# Patient Record
Sex: Male | Born: 1950 | Race: White | Hispanic: No | Marital: Married | Smoking: Former smoker
Health system: Southern US, Community
[De-identification: ages and names within clinical notes are randomized; demographics above are authoritative.]

## PROBLEM LIST (undated history)

## (undated) DIAGNOSIS — I10 Essential (primary) hypertension: Secondary | ICD-10-CM

## (undated) DIAGNOSIS — F419 Anxiety disorder, unspecified: Secondary | ICD-10-CM

## (undated) DIAGNOSIS — G4700 Insomnia, unspecified: Secondary | ICD-10-CM

## (undated) DIAGNOSIS — M1990 Unspecified osteoarthritis, unspecified site: Secondary | ICD-10-CM

## (undated) DIAGNOSIS — N400 Enlarged prostate without lower urinary tract symptoms: Secondary | ICD-10-CM

## (undated) DIAGNOSIS — E559 Vitamin D deficiency, unspecified: Secondary | ICD-10-CM

## (undated) DIAGNOSIS — E538 Deficiency of other specified B group vitamins: Secondary | ICD-10-CM

## (undated) DIAGNOSIS — K5790 Diverticulosis of intestine, part unspecified, without perforation or abscess without bleeding: Secondary | ICD-10-CM

## (undated) DIAGNOSIS — M2550 Pain in unspecified joint: Secondary | ICD-10-CM

## (undated) DIAGNOSIS — M549 Dorsalgia, unspecified: Secondary | ICD-10-CM

## (undated) DIAGNOSIS — F32A Depression, unspecified: Secondary | ICD-10-CM

## (undated) DIAGNOSIS — J189 Pneumonia, unspecified organism: Secondary | ICD-10-CM

## (undated) DIAGNOSIS — M1711 Unilateral primary osteoarthritis, right knee: Secondary | ICD-10-CM

## (undated) DIAGNOSIS — T4145XA Adverse effect of unspecified anesthetic, initial encounter: Secondary | ICD-10-CM

## (undated) DIAGNOSIS — M62838 Other muscle spasm: Secondary | ICD-10-CM

## (undated) DIAGNOSIS — F329 Major depressive disorder, single episode, unspecified: Secondary | ICD-10-CM

## (undated) DIAGNOSIS — R5382 Chronic fatigue, unspecified: Secondary | ICD-10-CM

## (undated) DIAGNOSIS — K649 Unspecified hemorrhoids: Secondary | ICD-10-CM

## (undated) DIAGNOSIS — R350 Frequency of micturition: Secondary | ICD-10-CM

## (undated) DIAGNOSIS — R42 Dizziness and giddiness: Secondary | ICD-10-CM

## (undated) DIAGNOSIS — E785 Hyperlipidemia, unspecified: Secondary | ICD-10-CM

## (undated) DIAGNOSIS — M2540 Effusion, unspecified joint: Secondary | ICD-10-CM

## (undated) DIAGNOSIS — M791 Myalgia, unspecified site: Secondary | ICD-10-CM

## (undated) DIAGNOSIS — T8859XA Other complications of anesthesia, initial encounter: Secondary | ICD-10-CM

## (undated) HISTORY — DX: Essential (primary) hypertension: I10

## (undated) HISTORY — DX: Vitamin D deficiency, unspecified: E55.9

## (undated) HISTORY — DX: Hyperlipidemia, unspecified: E78.5

## (undated) HISTORY — DX: Other complications of anesthesia, initial encounter: T88.59XA

## (undated) HISTORY — DX: Dizziness and giddiness: R42

## (undated) HISTORY — DX: Adverse effect of unspecified anesthetic, initial encounter: T41.45XA

## (undated) HISTORY — DX: Insomnia, unspecified: G47.00

## (undated) HISTORY — DX: Chronic fatigue, unspecified: R53.82

## (undated) HISTORY — DX: Unilateral primary osteoarthritis, right knee: M17.11

## (undated) HISTORY — DX: Anxiety disorder, unspecified: F41.9

## (undated) HISTORY — DX: Myalgia, unspecified site: M79.10

## (undated) HISTORY — DX: Benign prostatic hyperplasia without lower urinary tract symptoms: N40.0

## (undated) HISTORY — PX: JOINT REPLACEMENT: SHX530

## (undated) HISTORY — DX: Deficiency of other specified B group vitamins: E53.8

## (undated) HISTORY — PX: COLONOSCOPY: SHX174

---
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
## 2005-03-27 LAB — HM COLONOSCOPY

## 2007-03-28 DIAGNOSIS — E538 Deficiency of other specified B group vitamins: Secondary | ICD-10-CM

## 2007-03-28 HISTORY — DX: Deficiency of other specified B group vitamins: E53.8

## 2007-08-29 ENCOUNTER — Ambulatory Visit: Payer: Self-pay | Admitting: Internal Medicine

## 2007-08-29 DIAGNOSIS — K469 Unspecified abdominal hernia without obstruction or gangrene: Secondary | ICD-10-CM | POA: Insufficient documentation

## 2007-08-29 DIAGNOSIS — R5381 Other malaise: Secondary | ICD-10-CM

## 2007-08-29 DIAGNOSIS — J309 Allergic rhinitis, unspecified: Secondary | ICD-10-CM | POA: Insufficient documentation

## 2007-08-29 DIAGNOSIS — L719 Rosacea, unspecified: Secondary | ICD-10-CM | POA: Insufficient documentation

## 2007-08-29 DIAGNOSIS — R972 Elevated prostate specific antigen [PSA]: Secondary | ICD-10-CM

## 2007-08-29 DIAGNOSIS — I10 Essential (primary) hypertension: Secondary | ICD-10-CM | POA: Insufficient documentation

## 2007-08-29 DIAGNOSIS — R5383 Other fatigue: Secondary | ICD-10-CM

## 2007-08-29 DIAGNOSIS — E785 Hyperlipidemia, unspecified: Secondary | ICD-10-CM

## 2007-08-29 DIAGNOSIS — F411 Generalized anxiety disorder: Secondary | ICD-10-CM | POA: Insufficient documentation

## 2007-08-29 DIAGNOSIS — G479 Sleep disorder, unspecified: Secondary | ICD-10-CM | POA: Insufficient documentation

## 2007-09-10 ENCOUNTER — Encounter: Payer: Self-pay | Admitting: Internal Medicine

## 2007-10-18 ENCOUNTER — Telehealth: Payer: Self-pay | Admitting: Internal Medicine

## 2007-11-20 ENCOUNTER — Ambulatory Visit: Payer: Self-pay | Admitting: Internal Medicine

## 2007-11-20 LAB — CONVERTED CEMR LAB
ALT: 54 units/L — ABNORMAL HIGH (ref 0–53)
AST: 29 units/L (ref 0–37)
Albumin: 4 g/dL (ref 3.5–5.2)
Alkaline Phosphatase: 62 units/L (ref 39–117)
Basophils Absolute: 0 10*3/uL (ref 0.0–0.1)
Basophils Relative: 0.9 % (ref 0.0–3.0)
Bilirubin Urine: NEGATIVE
Calcium: 9.1 mg/dL (ref 8.4–10.5)
Chloride: 108 meq/L (ref 96–112)
Creatinine, Ser: 0.9 mg/dL (ref 0.4–1.5)
Eosinophils Absolute: 0.2 10*3/uL (ref 0.0–0.7)
GFR calc Af Amer: 112 mL/min
HDL: 35.5 mg/dL — ABNORMAL LOW (ref >39.0)
Hemoglobin: 15.9 g/dL (ref 13.0–17.0)
Ketones, ur: NEGATIVE mg/dL
LDL Cholesterol: 119 mg/dL — ABNORMAL HIGH (ref 0–99)
MCHC: 35.3 g/dL (ref 30.0–36.0)
MCV: 94.6 fL (ref 78.0–100.0)
Monocytes Absolute: 0.5 10*3/uL (ref 0.1–1.0)
Monocytes Relative: 8.4 % (ref 3.0–12.0)
Mucus, UA: NEGATIVE
Neutro Abs: 3.2 10*3/uL (ref 1.4–7.7)
Neutrophils Relative %: 59.4 % (ref 43.0–77.0)
Platelets: 265 10*3/uL (ref 150–400)
Potassium: 4 meq/L (ref 3.5–5.1)
RDW: 12.6 % (ref 11.5–14.6)
Sed Rate: 15 mm/hr (ref 0–16)
Specific Gravity, Urine: 1.01 (ref 1.000–1.03)
Testosterone: 345.56 ng/dL — ABNORMAL LOW (ref 350.00–890)
Total Bilirubin: 1.3 mg/dL — ABNORMAL HIGH (ref 0.3–1.2)
Total CHOL/HDL Ratio: 5.4
Total Protein: 6.9 g/dL (ref 6.0–8.3)
Triglycerides: 194 mg/dL — ABNORMAL HIGH (ref 0–149)
VLDL: 39 mg/dL (ref 0–40)
WBC: 5.5 10*3/uL (ref 4.5–10.5)

## 2007-11-29 ENCOUNTER — Ambulatory Visit: Payer: Self-pay | Admitting: Internal Medicine

## 2007-11-29 DIAGNOSIS — E538 Deficiency of other specified B group vitamins: Secondary | ICD-10-CM | POA: Insufficient documentation

## 2007-11-29 DIAGNOSIS — E559 Vitamin D deficiency, unspecified: Secondary | ICD-10-CM | POA: Insufficient documentation

## 2007-11-29 DIAGNOSIS — M791 Myalgia, unspecified site: Secondary | ICD-10-CM

## 2008-01-29 ENCOUNTER — Encounter: Payer: Self-pay | Admitting: Internal Medicine

## 2008-02-28 ENCOUNTER — Ambulatory Visit: Payer: Self-pay | Admitting: Internal Medicine

## 2008-03-02 LAB — CONVERTED CEMR LAB: Vit D, 1,25-Dihydroxy: 20 — ABNORMAL LOW (ref 30–89)

## 2008-03-06 ENCOUNTER — Ambulatory Visit: Payer: Self-pay | Admitting: Internal Medicine

## 2008-06-23 ENCOUNTER — Ambulatory Visit: Payer: Self-pay | Admitting: Internal Medicine

## 2008-06-23 LAB — CONVERTED CEMR LAB
CO2: 30 meq/L (ref 19–32)
Chloride: 103 meq/L (ref 96–112)
Creatinine, Ser: 0.8 mg/dL (ref 0.4–1.5)
Potassium: 4.3 meq/L (ref 3.5–5.1)

## 2008-07-03 ENCOUNTER — Ambulatory Visit: Payer: Self-pay | Admitting: Internal Medicine

## 2008-09-22 ENCOUNTER — Ambulatory Visit: Payer: Self-pay | Admitting: Internal Medicine

## 2008-09-22 LAB — CONVERTED CEMR LAB
CO2: 31 meq/L (ref 19–32)
Calcium: 9.3 mg/dL (ref 8.4–10.5)
Chloride: 103 meq/L (ref 96–112)
Glucose, Bld: 110 mg/dL — ABNORMAL HIGH (ref 70–99)
Sodium: 141 meq/L (ref 135–145)

## 2008-09-29 ENCOUNTER — Ambulatory Visit: Payer: Self-pay | Admitting: Internal Medicine

## 2009-01-15 ENCOUNTER — Ambulatory Visit: Payer: Self-pay | Admitting: Internal Medicine

## 2009-01-15 LAB — CONVERTED CEMR LAB
AST: 23 units/L (ref 0–37)
Albumin: 4 g/dL (ref 3.5–5.2)
Alkaline Phosphatase: 54 units/L (ref 39–117)
Basophils Absolute: 0 10*3/uL (ref 0.0–0.1)
CO2: 29 meq/L (ref 19–32)
Chloride: 103 meq/L (ref 96–112)
Cholesterol: 156 mg/dL (ref 0–200)
Eosinophils Absolute: 0.1 10*3/uL (ref 0.0–0.7)
Glucose, Bld: 123 mg/dL — ABNORMAL HIGH (ref 70–99)
HDL: 47.9 mg/dL (ref >39.00)
Ketones, ur: NEGATIVE mg/dL
LDL Cholesterol: 88 mg/dL (ref 0–99)
Leukocytes, UA: NEGATIVE
Lymphocytes Relative: 29.9 % (ref 12.0–46.0)
MCHC: 34.3 g/dL (ref 30.0–36.0)
Monocytes Relative: 10 % (ref 3.0–12.0)
Platelets: 233 10*3/uL (ref 150.0–400.0)
Potassium: 3.8 meq/L (ref 3.5–5.1)
RDW: 12.4 % (ref 11.5–14.6)
Sodium: 140 meq/L (ref 135–145)
Specific Gravity, Urine: 1.02 (ref 1.000–1.030)
TSH: 1.32 microintl units/mL (ref 0.35–5.50)
Total Bilirubin: 1 mg/dL (ref 0.3–1.2)
Total CHOL/HDL Ratio: 3
Triglycerides: 100 mg/dL (ref 0.0–149.0)
Urine Glucose: NEGATIVE mg/dL
Urobilinogen, UA: 0.2 (ref 0.0–1.0)
VLDL: 20 mg/dL (ref 0.0–40.0)
Vit D, 25-Hydroxy: 32 ng/mL (ref 30–89)
pH: 6 (ref 5.0–8.0)

## 2009-01-22 ENCOUNTER — Ambulatory Visit: Payer: Self-pay | Admitting: Internal Medicine

## 2009-01-22 DIAGNOSIS — Z87891 Personal history of nicotine dependence: Secondary | ICD-10-CM

## 2009-01-22 DIAGNOSIS — R079 Chest pain, unspecified: Secondary | ICD-10-CM

## 2009-06-30 ENCOUNTER — Telehealth: Payer: Self-pay | Admitting: Internal Medicine

## 2009-07-15 ENCOUNTER — Ambulatory Visit: Payer: Self-pay | Admitting: Internal Medicine

## 2009-07-15 LAB — CONVERTED CEMR LAB
Calcium: 9.7 mg/dL (ref 8.4–10.5)
GFR calc non Af Amer: 92.03 mL/min (ref >60)
Glucose, Bld: 103 mg/dL — ABNORMAL HIGH (ref 70–99)
Potassium: 4.5 meq/L (ref 3.5–5.1)
Sodium: 139 meq/L (ref 135–145)
Vit D, 25-Hydroxy: 35 ng/mL (ref 30–89)

## 2009-07-22 ENCOUNTER — Ambulatory Visit: Payer: Self-pay | Admitting: Internal Medicine

## 2009-07-22 DIAGNOSIS — N400 Enlarged prostate without lower urinary tract symptoms: Secondary | ICD-10-CM | POA: Insufficient documentation

## 2009-10-22 ENCOUNTER — Ambulatory Visit: Payer: Self-pay | Admitting: Internal Medicine

## 2009-10-22 LAB — CONVERTED CEMR LAB
Calcium: 8.9 mg/dL (ref 8.4–10.5)
GFR calc non Af Amer: 106.87 mL/min (ref >60)
PSA: 3.48 ng/mL (ref 0.10–4.00)
Potassium: 4.1 meq/L (ref 3.5–5.1)
Sodium: 136 meq/L (ref 135–145)

## 2009-10-24 LAB — CONVERTED CEMR LAB
PSA, Free Pct: 6 — ABNORMAL LOW (ref >25)
PSA, Free: 0.2 ng/mL
PSA: 3.09 ng/mL (ref 0.10–4.00)

## 2009-10-29 ENCOUNTER — Ambulatory Visit: Payer: Self-pay | Admitting: Internal Medicine

## 2009-12-20 ENCOUNTER — Encounter: Payer: Self-pay | Admitting: Internal Medicine

## 2010-01-25 HISTORY — PX: PROSTATE BIOPSY: SHX241

## 2010-02-16 ENCOUNTER — Encounter: Payer: Self-pay | Admitting: Internal Medicine

## 2010-03-27 HISTORY — PX: KNEE ARTHROSCOPY: SUR90

## 2010-04-22 ENCOUNTER — Other Ambulatory Visit: Payer: Self-pay | Admitting: Internal Medicine

## 2010-04-22 ENCOUNTER — Ambulatory Visit
Admission: RE | Admit: 2010-04-22 | Discharge: 2010-04-22 | Payer: Self-pay | Source: Home / Self Care | Attending: Internal Medicine | Admitting: Internal Medicine

## 2010-04-22 ENCOUNTER — Telehealth: Payer: Self-pay | Admitting: Internal Medicine

## 2010-04-22 LAB — LIPID PANEL
HDL: 51.4 mg/dL (ref >39.00)
LDL Cholesterol: 96 mg/dL (ref 0–99)
Total CHOL/HDL Ratio: 3
Triglycerides: 114 mg/dL (ref 0.0–149.0)
VLDL: 22.8 mg/dL (ref 0.0–40.0)

## 2010-04-22 LAB — HEPATIC FUNCTION PANEL
ALT: 34 U/L (ref 0–53)
AST: 21 U/L (ref 0–37)
Alkaline Phosphatase: 54 U/L (ref 39–117)
Bilirubin, Direct: 0.2 mg/dL (ref 0.0–0.3)

## 2010-04-22 LAB — URINALYSIS, ROUTINE W REFLEX MICROSCOPIC
Bilirubin Urine: NEGATIVE
Ketones, ur: NEGATIVE

## 2010-04-22 LAB — BASIC METABOLIC PANEL
Calcium: 9.2 mg/dL (ref 8.4–10.5)
Chloride: 105 mEq/L (ref 96–112)
Potassium: 4.4 mEq/L (ref 3.5–5.1)

## 2010-04-22 LAB — TSH: TSH: 0.8 u[IU]/mL (ref 0.35–5.50)

## 2010-04-22 LAB — CBC WITH DIFFERENTIAL/PLATELET
Basophils Absolute: 0 10*3/uL (ref 0.0–0.1)
Eosinophils Absolute: 0.1 10*3/uL (ref 0.0–0.7)
Eosinophils Relative: 1.8 % (ref 0.0–5.0)
Hemoglobin: 15.8 g/dL (ref 13.0–17.0)
MCHC: 35.5 g/dL (ref 30.0–36.0)
Monocytes Absolute: 0.5 10*3/uL (ref 0.1–1.0)
Monocytes Relative: 9 % (ref 3.0–12.0)
Neutro Abs: 3.7 10*3/uL (ref 1.4–7.7)
RDW: 13.4 % (ref 11.5–14.6)

## 2010-04-22 LAB — VITAMIN B12: Vitamin B-12: >1500 pg/mL — ABNORMAL HIGH (ref 211–911)

## 2010-04-26 NOTE — Assessment & Plan Note (Signed)
Summary: 6 MO ROV /NWS #   Vital Signs:  Patient profile:   60 year old male Height:      73 inches (185.42 cm) Weight:      225.25 pounds (102.39 kg) BMI:     29.83 O2 Sat:      94 % on Room air Temp:     96.7 degrees F (35.94 degrees C) oral Pulse rate:   63 / minute Pulse rhythm:   regular BP sitting:   112 / 80  (left arm) Cuff size:   regular  Vitals Entered By: Brenton Grills (July 22, 2009 9:06 AM)  O2 Flow:  Room air CC: 6 mo f/u visit/aj   CC:  6 mo f/u visit/aj.  History of Present Illness: The patient presents for a follow up of hypertension, spasms, hyperlipidemia C/o dribbling   Current Medications (verified): 1)  Cymbalta 60 Mg  Cpep (Duloxetine Hcl) .... Take 1 By Mouth Q Am 2)  Triamterene-Hctz 37.5-25 Mg  Tabs (Triamterene-Hctz) .... Take 1 By Mouth Q Am 3)  Lisinopril 10 Mg  Tabs (Lisinopril) .... Take 1 Tablet By Mouth Once A Day 4)  Aspirin Adult Low Strength 81 Mg  Tbec (Aspirin) .... Take 1 By Mouth Qd 5)  Lovastatin 40 Mg  Tabs (Lovastatin) .... Take 1 By Mouth Qd 6)  Ibuprofen 600 Mg  Tabs (Ibuprofen) .... Take 1-2 Qhs 7)  Slo-Niacin 500 Mg Cr-Tabs (Niacin) .... Once Daily 8)  Vitamin B-12 Cr 1000 Mcg  Tbcr (Cyanocobalamin) .... Take Two Tablets By Mouth Daily 9)  Diazepam 5 Mg Tabs (Diazepam) .... As Needed 10)  Baclofen 20 Mg Tabs (Baclofen) .Marland Kitchen.. 1 By Mouth Tid 11)  Align  Caps (Misc Intestinal Flora Regulat) .Marland Kitchen.. 1 By Mouth Qd 12)  Cvs Vit D 5000 High-Potency 5000 Unit Caps (Cholecalciferol) .Marland Kitchen.. 1 By Mouth Qd  Allergies (verified): No Known Drug Allergies  Past History:  Past Surgical History: Last updated: 01/22/2009 Denies surgical history  Social History: Last updated: 08/29/2007 Occupation: Education officer, environmental business - solo Married Former Smoker  Past Medical History: Hyperlipidemia Hypertension Muscle/Joint Pain Dr Phylliss Bob Insomnia Chronic fatigue GI Dr Randa Evens Allergic rhinitis Anxiety GI Dr Randa Evens 8/08 Vit D def Vit B12 def  2009 Benign prostatic hypertrophy  Review of Systems  The patient denies fever, prolonged cough, abdominal pain, and melena.         LBP, stiffness - better  Physical Exam  General:  Well-developed,well-nourished,in no acute distress; alert,appropriate and cooperative throughout examination Nose:  External nasal examination shows no deformity or inflammation. Nasal mucosa are pink and moist without lesions or exudates. Mouth:  Oral mucosa and oropharynx without lesions or exudates.  Teeth in good repair. Neck:  No deformities, masses, or tenderness noted. Lungs:  Normal respiratory effort, chest expands symmetrically. Lungs are clear to auscultation, no crackles or wheezes. Heart:  Normal rate and regular rhythm. S1 and S2 normal without gallop, murmur, click, rub or other extra sounds. Abdomen:  Bowel sounds positive,abdomen soft and non-tender without masses, organomegaly or hernias noted. Msk:  No deformity or scoliosis noted of thoracic or lumbar spine.  Lumbar-sacral spine is tender to palpation over paraspinal muscles and painfull with the ROM ; LS less stiff Extremities:  No clubbing, cyanosis, edema, or deformity noted with normal full range of motion of all joints.   Neurologic:  No cranial nerve deficits noted. Station and gait are normal. Plantar reflexes are down-going bilaterally. DTRs are symmetrical throughout. Sensory, motor and coordinative functions  appear intact. Skin:  Intact without suspicious lesions or rashes Facial rosacea Psych:  Cognition and judgment appear intact. Alert and cooperative with normal attention span and concentration. No apparent delusions, illusions, hallucinations   Impression & Recommendations:  Problem # 1:  PSA, INCREASED (ICD-790.93) Assessment Comment Only Recheck in 3 months   Problem # 2:  B12 DEFICIENCY (ICD-266.2) Assessment: Improved On OTC  therapy   Problem # 3:  FIBROMYALGIA (ICD-729.1) Assessment: Improved  His updated  medication list for this problem includes:    Aspirin Adult Low Strength 81 Mg Tbec (Aspirin) .Marland Kitchen... Take 1 by mouth qd    Ibuprofen 600 Mg Tabs (Ibuprofen) .Marland Kitchen... Take 1-2 qhs    Baclofen 20 Mg Tabs (Baclofen) .Marland Kitchen... 1 by mouth tid  Problem # 4:  HYPERTENSION (ICD-401.9) Assessment: Improved  The following medications were removed from the medication list:    Triamterene-hctz 37.5-25 Mg Tabs (Triamterene-hctz) .Marland Kitchen... Take 1 by mouth q am His updated medication list for this problem includes:    Lisinopril 10 Mg Tabs (Lisinopril) .Marland Kitchen... Take 1 tablet by mouth once a day    Doxazosin Mesylate 4 Mg Tabs (Doxazosin mesylate) .Marland Kitchen... 1 by mouth in pm  BP today: 112/80 Prior BP: 132/84 (01/22/2009)  Labs Reviewed: K+: 4.5 (07/15/2009) Creat: : 0.9 (07/15/2009)   Chol: 156 (01/15/2009)   HDL: 47.90 (01/15/2009)   LDL: 88 (01/15/2009)   TG: 100.0 (01/15/2009)  Problem # 5:  BENIGN PROSTATIC HYPERTROPHY (ICD-600.00) Assessment: Unchanged  His updated medication list for this problem includes:    Doxazosin Mesylate 4 Mg Tabs (Doxazosin mesylate) .Marland Kitchen... 1 by mouth in pm  Complete Medication List: 1)  Cymbalta 60 Mg Cpep (Duloxetine hcl) .... Take 1 by mouth q am 2)  Lisinopril 10 Mg Tabs (Lisinopril) .... Take 1 tablet by mouth once a day 3)  Aspirin Adult Low Strength 81 Mg Tbec (Aspirin) .... Take 1 by mouth qd 4)  Lovastatin 40 Mg Tabs (Lovastatin) .... Take 1 by mouth qd 5)  Ibuprofen 600 Mg Tabs (Ibuprofen) .... Take 1-2 qhs 6)  Slo-niacin 500 Mg Cr-tabs (Niacin) .... Once daily 7)  Vitamin B-12 Cr 1000 Mcg Tbcr (Cyanocobalamin) .... Take two tablets by mouth daily 8)  Diazepam 5 Mg Tabs (Diazepam) .... As needed 9)  Baclofen 20 Mg Tabs (Baclofen) .Marland Kitchen.. 1 by mouth tid 10)  Align Caps (Misc intestinal flora regulat) .Marland Kitchen.. 1 by mouth qd 11)  Cvs Vit D 5000 High-potency 5000 Unit Caps (Cholecalciferol) .Marland Kitchen.. 1 by mouth qd 12)  Doxazosin Mesylate 4 Mg Tabs (Doxazosin mesylate) .Marland Kitchen.. 1 by mouth in  pm  Patient Instructions: 1)  Stop HCTZ and start Doxazosin 2)  Please schedule a follow-up appointment in 4 months. 3)  BMP prior to visit, ICD-9: 401.1 4)  PSA prior to visit, ICD-9: and free PSA 995.20 Prescriptions: DOXAZOSIN MESYLATE 4 MG TABS (DOXAZOSIN MESYLATE) 1 by mouth in pm  #90 x 3   Entered and Authorized by:   Tresa Garter MD   Signed by:   Tresa Garter MD on 07/22/2009   Method used:   Print then Give to Patient   RxID:   [id number]

## 2010-04-26 NOTE — Progress Notes (Signed)
Summary: triam/hctz  Phone Note Refill Request Message from:  Fax from Pharmacy on June 30, 2009 3:17 PM  Refills Requested: Medication #1:  TRIAMTERENE-HCTZ 37.5-25 MG  TABS TAKE 1 by mouth Q AM Madilyn Hook [phone]   Method Requested: Electronic Initial call taken by: Orlan Leavens,  June 30, 2009 3:17 PM    Prescriptions: TRIAMTERENE-HCTZ 37.5-25 MG  TABS (TRIAMTERENE-HCTZ) TAKE 1 by mouth Q AM  #90 x 1   Entered by:   Orlan Leavens   Authorized by:   Tresa Garter MD   Signed by:   Orlan Leavens on 06/30/2009   Method used:   Electronically to        [facility] (retail)       [address]       [place], [place]  [address]       Ph: [phone] or [phone]       Fax: [phone]   RxID:   [id number]   Appended Document: triam/hctz & lovastatin    Prescriptions: LOVASTATIN 40 MG  TABS (LOVASTATIN) TAKE 1 by mouth QD  #90 x 1   Entered by:   Orlan Leavens   Authorized by:   Tresa Garter MD   Signed by:   Orlan Leavens on 06/30/2009   Method used:   Electronically to        [facility] (retail)       [address]       [place], [place]  [address]       Ph: [phone] or [phone]       Fax: [phone]   RxID:   [id number]

## 2010-04-26 NOTE — Consult Note (Signed)
Summary: [facility]  [facility]   Imported By: Lester [place] 12/27/2009 07:34:10  _____________________________________________________________________  External Attachment:    Type:   Image     Comment:   External Document

## 2010-04-26 NOTE — Assessment & Plan Note (Signed)
Summary: 3 MO ROV /NWS  #   Vital Signs:  Patient profile:   60 year old male Height:      73 inches Weight:      224 pounds BMI:     29.66 Temp:     97.1 degrees F oral Pulse rate:   72 / minute Pulse rhythm:   regular Resp:     16 per minute BP sitting:   120 / 86  (left arm) Cuff size:   regular  Vitals Entered By: Lanier Prude, CMA(AAMA) (October 29, 2009 10:01 AM) CC: 3 mo f/u Is Patient Diabetic? No   CC:  3 mo f/u.  History of Present Illness: The patient presents for a follow up of back pain, anxiety, depression and abn PSA.    Current Medications (verified): 1)  Cymbalta 60 Mg  Cpep (Duloxetine Hcl) .... Take 1 By Mouth Q Am 2)  Lisinopril 10 Mg  Tabs (Lisinopril) .... Take 1 Tablet By Mouth Once A Day 3)  Aspirin Adult Low Strength 81 Mg  Tbec (Aspirin) .... Take 1 By Mouth Qd 4)  Lovastatin 40 Mg  Tabs (Lovastatin) .... Take 1 By Mouth Qd 5)  Ibuprofen 600 Mg  Tabs (Ibuprofen) .... Take 1-2 Qhs 6)  Slo-Niacin 500 Mg Cr-Tabs (Niacin) .... Once Daily 7)  Vitamin B-12 Cr 1000 Mcg  Tbcr (Cyanocobalamin) .... Take Two Tablets By Mouth Daily 8)  Diazepam 5 Mg Tabs (Diazepam) .... As Needed 9)  Baclofen 20 Mg Tabs (Baclofen) .Marland Kitchen.. 1 By Mouth Tid 10)  Align  Caps (Misc Intestinal Flora Regulat) .Marland Kitchen.. 1 By Mouth Qd 11)  Cvs Vit D 5000 High-Potency 5000 Unit Caps (Cholecalciferol) .Marland Kitchen.. 1 By Mouth Qd 12)  Doxazosin Mesylate 4 Mg Tabs (Doxazosin Mesylate) .Marland Kitchen.. 1 By Mouth in Pm  Allergies (verified): No Known Drug Allergies  Past History:  Past Surgical History: Last updated: 01/22/2009 Denies surgical history  Family History: Last updated: 08/29/2007 Family History Hypertension CAD after 60 yo B died at 68 ? 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  The patient denies fever, chest pain, dyspnea on exertion, and abdominal pain.         Aches and spasms, stiffness  Physical Exam  General:  Well-developed,well-nourished,in no acute distress; alert,appropriate and cooperative throughout examination Ears:  External ear exam shows no significant lesions or deformities.  Otoscopic examination reveals clear canals, tympanic membranes are intact bilaterally without bulging, retraction, inflammation or discharge. Hearing is grossly normal bilaterally. Nose:  External nasal examination shows no deformity or inflammation. Nasal mucosa are pink and moist without lesions or exudates. Mouth:  Oral mucosa and oropharynx without lesions or exudates.  Teeth in good repair. Neck:  No deformities, masses, or tenderness noted. Lungs:  Normal respiratory effort, chest expands symmetrically. Lungs are clear to auscultation, no crackles or wheezes. Heart:  Normal rate and regular rhythm. S1 and S2 normal without gallop, murmur, click, rub or other extra sounds. Abdomen:  Bowel sounds positive,abdomen soft and non-tender without masses, organomegaly or hernias noted. Msk:  No deformity or scoliosis noted of thoracic or lumbar spine.  Lumbar-sacral spine is tender  to palpation over paraspinal muscles and painfull with the ROM ; LS less stiff Neurologic:  No cranial nerve deficits noted. Station and gait are normal. Plantar reflexes are down-going bilaterally. DTRs are symmetrical throughout. Sensory, motor and coordinative functions appear intact.   Impression & Recommendations:  Problem # 1:  B12 DEFICIENCY (ICD-266.2) Assessment Improved On the regimen of medicine(s) reflected in the chart    Problem # 2:  FIBROMYALGIA (ICD-729.1) Assessment: Unchanged  His updated medication  list for this problem includes:    Aspirin Adult Low Strength 81 Mg Tbec (Aspirin) .Marland Kitchen... Take 1 by mouth qd    Ibuprofen 600 Mg Tabs (Ibuprofen) .Marland Kitchen... Take 1-2 qhs    Baclofen 20 Mg Tabs (Baclofen) .Marland Kitchen... 1 by mouth tid  Problem # 3:  BENIGN PROSTATIC HYPERTROPHY (ICD-600.00) Assessment: Improved  His updated medication list for this problem includes:    Doxazosin Mesylate 4 Mg Tabs (Doxazosin mesylate) .Marland Kitchen... 1 by mouth in pm  Orders: Urology Referral (Urology)  Problem # 4:  PSA, INCREASED (ICD-790.93) Assessment: Deteriorated The labs were reviewed with the patient.  Orders: Urology Referral (Urology) Dr Claris Gladden  PSA                       3.09 ng/mL                  0.10-4.00     Test Methodology: [employer]britech PSA   PSA, Free                 0.2 ng/mL   PSA, %Free           [L]  6 %                         > 25                              Probability of Prostate Cancer     (For Men with Non-Suspicious DRE Results and PSA Between 4 and     10 ng/mL, By Patient Age)           % free PSA                          Patient Age                              82 to 27 Years      43 to 17 Years      0.00 - 10.00%                 56%                 55%     10.01 - 15.00%                 24%                 35%     15.01 - 20.00%                 17%                 23%     20.01 - 25.00%                 10%  20%        > 25%                        5%                  9%  Complete Medication List: 1)  Cymbalta 60 Mg Cpep (Duloxetine hcl) .... Take 1 by mouth q am 2)  Lisinopril 10 Mg Tabs (Lisinopril) .... Take 1 tablet by mouth once a day 3)  Aspirin Adult Low Strength 81 Mg Tbec (Aspirin) .... Take 1 by mouth qd 4)  Lovastatin 40 Mg Tabs (Lovastatin) .... Take 1 by mouth qd 5)  Ibuprofen 600 Mg Tabs (Ibuprofen) .... Take 1-2 qhs 6)  Slo-niacin 500 Mg Cr-tabs (Niacin) .... Once daily 7)  Vitamin B-12 Cr 1000 Mcg Tbcr (Cyanocobalamin) .... Take two tablets by mouth  daily 8)  Diazepam 5 Mg Tabs (Diazepam) .... As needed 9)  Baclofen 20 Mg Tabs (Baclofen) .Marland Kitchen.. 1 by mouth tid 10)  Align Caps (Misc intestinal flora regulat) .Marland Kitchen.. 1 by mouth qd 11)  Cvs Vit D 5000 High-potency 5000 Unit Caps (Cholecalciferol) .Marland Kitchen.. 1 by mouth qd 12)  Doxazosin Mesylate 4 Mg Tabs (Doxazosin mesylate) .Marland Kitchen.. 1 by mouth in pm 13)  Hydroxyzine Hcl 25 Mg Tabs (Hydroxyzine hcl) .Marland Kitchen.. 1-3 tabs by mouth  as needed  for sleep  Patient Instructions: 1)  Please schedule a follow-up appointment in 6 months well w/labs and Vit B12 782.0. ewPrescriptions: HYDROXYZINE HCL 25 MG TABS (HYDROXYZINE HCL) 1-3 tabs by mouth  as needed  for sleep  #60 x 3   Entered and Authorized by:   Tresa Garter MD   Signed by:   Tresa Garter MD on 10/29/2009   Method used:   Electronically to        [facility] (retail)       [address]       [place], [place]  [address]       Ph: [phone] or [phone]       Fax: [phone]   RxID:   [id number]

## 2010-04-26 NOTE — Letter (Signed)
Summary: [facility]  [facility]   Imported By: Lennie Odor 03/01/2010 11:11:18  _____________________________________________________________________  External Attachment:    Type:   Image     Comment:   External Document

## 2010-04-28 ENCOUNTER — Encounter (INDEPENDENT_AMBULATORY_CARE_PROVIDER_SITE_OTHER): Payer: BC Managed Care – PPO | Admitting: Internal Medicine

## 2010-04-28 ENCOUNTER — Other Ambulatory Visit: Payer: Self-pay | Admitting: Internal Medicine

## 2010-04-28 ENCOUNTER — Ambulatory Visit: Admit: 2010-04-28 | Payer: Self-pay | Admitting: Internal Medicine

## 2010-04-28 ENCOUNTER — Encounter: Payer: Self-pay | Admitting: Internal Medicine

## 2010-04-28 DIAGNOSIS — D485 Neoplasm of uncertain behavior of skin: Secondary | ICD-10-CM | POA: Insufficient documentation

## 2010-04-28 DIAGNOSIS — H6120 Impacted cerumen, unspecified ear: Secondary | ICD-10-CM | POA: Insufficient documentation

## 2010-04-28 DIAGNOSIS — Z0000 Encounter for general adult medical examination without abnormal findings: Secondary | ICD-10-CM

## 2010-04-28 DIAGNOSIS — Z23 Encounter for immunization: Secondary | ICD-10-CM

## 2010-05-04 NOTE — Assessment & Plan Note (Signed)
Summary: CPX  ---[employer]   Vital Signs:  Patient profile:   60 year old male Height:      73 inches Weight:      227 pounds BMI:     30.06 Temp:     97.7 degrees F oral Pulse rate:   72 / minute Pulse rhythm:   regular Resp:     16 per minute BP sitting:   110 / 74  (left arm) Cuff size:   regular  Vitals Entered By: Lanier Prude, CMA(AAMA) (April 28, 2010 10:41 AM)  Procedure Note  Mole Biopsy/Removal: The patient complains of changing mole. Indication: changing lesion Consent signed: yes  Procedure # 1: shave biopsy    Size (in cm): 1.1 x 0.9    Region: lateral    Location: R forehead    Comment: Risks including but not limited by incomplete procedure, bleeding, infection, recurrence were discussed with the patient. Consent form was signed. Tolerated well. Complicatons - none. Pathol. specimen sent out. Four skin tags were removed at no charge and discarded.    Instrument used: dermablade    Anesthesia: 1.0 ml 1% lidocaine w/epinephrine  Cleaned and prepped with: alcohol and betadine Wound dressing: bandaid Instructions: daily dressing changes  CC: CPX Is Patient Diabetic? No Comments pt is not taking Hydroxyzine   CC:  CPX.  History of Present Illness: The patient presents for a preventive health examination  C/o mole on forehead getting bigger  Current Medications (verified): 1)  Cymbalta 60 Mg  Cpep (Duloxetine Hcl) .... Take 1 By Mouth Q Am 2)  Lisinopril 10 Mg  Tabs (Lisinopril) .... Take 1 Tablet By Mouth Once A Day 3)  Aspirin Adult Low Strength 81 Mg  Tbec (Aspirin) .... Take 1 By Mouth Qd 4)  Lovastatin 40 Mg  Tabs (Lovastatin) .... Take 1 By Mouth Qd 5)  Ibuprofen 600 Mg  Tabs (Ibuprofen) .... Take 1-2 Qhs 6)  Slo-Niacin 500 Mg Cr-Tabs (Niacin) .... Once Daily 7)  Vitamin B-12 Cr 1000 Mcg  Tbcr (Cyanocobalamin) .... Take Two Tablets By Mouth Daily 8)  Diazepam 5 Mg Tabs (Diazepam) .... As Needed 9)  Baclofen 20 Mg Tabs (Baclofen) .Marland Kitchen.. 1 By Mouth  Tid 10)  Align  Caps (Misc Intestinal Flora Regulat) .Marland Kitchen.. 1 By Mouth Qd 11)  Cvs Vit D 5000 High-Potency 5000 Unit Caps (Cholecalciferol) .Marland Kitchen.. 1 By Mouth Qd 12)  Doxazosin Mesylate 4 Mg Tabs (Doxazosin Mesylate) .Marland Kitchen.. 1 By Mouth in Pm 13)  Hydroxyzine Hcl 25 Mg Tabs (Hydroxyzine Hcl) .Marland Kitchen.. 1-3 Tabs By Mouth  As Needed  For Sleep 14)  Restoril 30 Mg Caps (Temazepam) .Marland Kitchen.. 1 By Mouth At Bedtime 15)  Neurontin 100 Mg Caps (Gabapentin) .Marland Kitchen.. 1 By Mouth Three Times A Day, 300mg  By Mouth At Bedtime  Allergies (verified): No Known Drug Allergies  Past History:  Past Medical History: Last updated: 10/29/2009 Hyperlipidemia Hypertension Muscle/Joint Pain Dr Phylliss Bob Insomnia Chronic fatigue GI Dr Randa Evens Allergic rhinitis Anxiety GI Dr Randa Evens 8/08 Vit D def Vit B12 def 2009 Benign prostatic hypertrophy PSA>2  2011  Family History: Last updated: 08/29/2007 Family History Hypertension CAD after 59 yo B died at 84 ? cause  Social History: Last updated: 08/29/2007 Occupation: Education officer, environmental business - solo Married Former Smoker  Past Surgical History: Denies surgical history Prostate biopsy 11/11  Review of Systems       The patient complains of suspicious skin lesions.  The patient denies anorexia, fever, weight loss, weight gain, vision loss,  decreased hearing, hoarseness, chest pain, syncope, dyspnea on exertion, peripheral edema, prolonged cough, headaches, hemoptysis, abdominal pain, melena, hematochezia, severe indigestion/heartburn, hematuria, incontinence, genital sores, muscle weakness, transient blindness, difficulty walking, depression, unusual weight change, abnormal bleeding, enlarged lymph nodes, angioedema, and testicular masses.         stiffness and pains  Physical Exam  General:  Well-developed,well-nourished,in no acute distress; alert,appropriate and cooperative throughout examinationwell-hydrated.   Head:  Normocephalic and atraumatic without obvious abnormalities. No  apparent alopecia or balding. Eyes:  No corneal or conjunctival inflammation noted. EOMI. Perrla. Ears:  wax B Nose:  External nasal examination shows no deformity or inflammation. Nasal mucosa are pink and moist without lesions or exudates. Mouth:  Oral mucosa and oropharynx without lesions or exudates.  Teeth in good repair. Neck:  No deformities, masses, or tenderness noted. Lungs:  Normal respiratory effort, chest expands symmetrically. Lungs are clear to auscultation, no crackles or wheezes. Heart:  Normal rate and regular rhythm. S1 and S2 normal without gallop, murmur, click, rub or other extra sounds. Abdomen:  Bowel sounds positive,abdomen soft and non-tender without masses, organomegaly or hernias noted. Rectal:  NE Genitalia:  per Urol Prostate:  per Urol Msk:  No deformity or scoliosis noted of thoracic or lumbar spine.  Lumbar-sacral spine is tender to palpation over paraspinal muscles and painfull with the ROM ; LS less stiff Pulses:  R and L carotid,radial,femoral,dorsalis pedis and posterior tibial pulses are full and equal bilaterally Extremities:  No clubbing, cyanosis, edema, or deformity noted with normal full range of motion of all joints.   Neurologic:  No cranial nerve deficits noted. Station and gait are normal. Plantar reflexes are down-going bilaterally. DTRs are symmetrical throughout. Sensory, motor and coordinative functions appear intact. Skin:  11x10 mm mole on R forehead tags Cervical Nodes:  No lymphadenopathy noted Psych:  Cognition and judgment appear intact. Alert and cooperative with normal attention span and concentration. No apparent delusions, illusions, hallucinations   Impression & Recommendations:  Problem # 1:  HEALTH MAINTENANCE EXAM (ICD-V70.0) Assessment New  The labs were reviewed with the patient.  Health and age related issues were discussed. Available screening tests and vaccinations were discussed as well. Healthy life style including  good diet and exercise was discussed.  CXR next year  Orders: EKG w/ Interpretation (93000)  Problem # 2:  CERUMEN IMPACTION (ICD-380.4) Assessment: New  Procedure: ear irrigation Reason: wax impaction Risks/benefis were discussed. Both ears were irrigated with warm water. Large ammount of wax was recovered. Instrumentation with metal ear loop was performed to accomplish the removal. Tolerated well Complications: none    Orders: Cerumen Impaction Removal (16109)  Problem # 3:  NEOPLASM OF UNCERTAIN BEHAVIOR OF SKIN (ICD-238.2) Assessment: Deteriorated  Orders: Shave Skin Lesion 1.1-2.0cm face/ears/eyelids/nose/lips/mm (60454)  Complete Medication List: 1)  Cymbalta 60 Mg Cpep (Duloxetine hcl) .... Take 1 by mouth q am 2)  Lisinopril 10 Mg Tabs (Lisinopril) .... Take 1 tablet by mouth once a day 3)  Aspirin Adult Low Strength 81 Mg Tbec (Aspirin) .... Take 1 by mouth qd 4)  Lovastatin 40 Mg Tabs (Lovastatin) .... Take 1 by mouth qd 5)  Ibuprofen 600 Mg Tabs (Ibuprofen) .... Take 1-2 qhs 6)  Slo-niacin 500 Mg Cr-tabs (Niacin) .... Once daily 7)  Vitamin B-12 Cr 1000 Mcg Tbcr (Cyanocobalamin) .... Take two tablets by mouth daily 8)  Diazepam 5 Mg Tabs (Diazepam) .... As needed 9)  Baclofen 20 Mg Tabs (Baclofen) .Marland Kitchen.. 1 by mouth tid 10)  Align Caps (Misc intestinal flora regulat) .Marland Kitchen.. 1 by mouth qd 11)  Cvs Vit D 5000 High-potency 5000 Unit Caps (Cholecalciferol) .Marland Kitchen.. 1 by mouth qd 12)  Doxazosin Mesylate 4 Mg Tabs (Doxazosin mesylate) .Marland Kitchen.. 1 by mouth in pm 13)  Hydroxyzine Hcl 25 Mg Tabs (Hydroxyzine hcl) .Marland Kitchen.. 1-3 tabs by mouth  as needed  for sleep 14)  Restoril 30 Mg Caps (Temazepam) .Marland Kitchen.. 1 by mouth at bedtime 15)  Neurontin 100 Mg Caps (Gabapentin) .Marland Kitchen.. 1 by mouth three times a day, 300mg  by mouth at bedtime  Other Orders: Tdap => 33yrs IM (40981) Admin 1st Vaccine (19147)  Patient Instructions: 1)  Please schedule a follow-up appointment in 6  months. Prescriptions: NEURONTIN 100 MG CAPS (GABAPENTIN) 1 by mouth three times a day, 300mg  by mouth at bedtime  #540 x 1   Entered and Authorized by:   Tresa Garter MD   Signed by:   Tresa Garter MD on 04/28/2010   Method used:   Print then Give to Patient   RxID:   [id number] RESTORIL 30 MG CAPS (TEMAZEPAM) 1 by mouth at bedtime  #90 x 1   Entered and Authorized by:   Tresa Garter MD   Signed by:   Tresa Garter MD on 04/28/2010   Method used:   Print then Give to Patient   RxID:   [id number] DOXAZOSIN MESYLATE 4 MG TABS (DOXAZOSIN MESYLATE) 1 by mouth in pm  #90 x 3   Entered and Authorized by:   Tresa Garter MD   Signed by:   Tresa Garter MD on 04/28/2010   Method used:   Print then Give to Patient   RxID:   [id number] BACLOFEN 20 MG TABS (BACLOFEN) 1 by mouth tid  #270 x 3   Entered and Authorized by:   Tresa Garter MD   Signed by:   Tresa Garter MD on 04/28/2010   Method used:   Print then Give to Patient   RxID:   [id number] DIAZEPAM 5 MG TABS (DIAZEPAM) as needed  #90 x 1   Entered and Authorized by:   Tresa Garter MD   Signed by:   Tresa Garter MD on 04/28/2010   Method used:   Print then Give to Patient   RxID:   [id number] LOVASTATIN 40 MG  TABS (LOVASTATIN) TAKE 1 by mouth QD  #90 Tablet x 3   Entered and Authorized by:   Tresa Garter MD   Signed by:   Tresa Garter MD on 04/28/2010   Method used:   Print then Give to Patient   RxID:   [id number] LISINOPRIL 10 MG  TABS (LISINOPRIL) Take 1 tablet by mouth once a day  #90 Tablet x 3   Entered and Authorized by:   Tresa Garter MD   Signed by:   Tresa Garter MD on 04/28/2010   Method used:   Print then Give to Patient   RxID:   [id number] CYMBALTA 60 MG  CPEP (DULOXETINE HCL) TAKE 1 by mouth Q AM  #90 x 3   Entered and Authorized by:   Tresa Garter MD   Signed by:   Tresa Garter MD on 04/28/2010   Method used:   Print then Give to Patient   RxID:   [id number]    Orders Added: 1)  Tdap => 86yrs IM [35009] 2)  Admin 1st Vaccine [90471] 3)  EKG w/  Interpretation [93000] 4)  Shave Skin Lesion 1.1-2.0cm face/ears/eyelids/nose/lips/mm [11312] 5)  Est. Patient 40-64 years [99396] 6)  Cerumen Impaction Removal [69210]   Immunizations Administered:  Tetanus Vaccine:    Vaccine Type: Tdap    Site: left deltoid    Mfr: GlaxoSmithKline    Dose: 0.5 ml    Route: IM    Given by: Lanier Prude, CMA(AAMA)    Exp. Date: 01/14/2012    Lot #: [id number]    VIS given: 02/12/08 version given April 28, 2010.   Immunizations Administered:  Tetanus Vaccine:    Vaccine Type: Tdap    Site: left deltoid    Mfr: GlaxoSmithKline    Dose: 0.5 ml    Route: IM    Given by: Lanier Prude, CMA(AAMA)    Exp. Date: 01/14/2012    Lot #: [id number]    VIS given: 02/12/08 version given April 28, 2010.

## 2010-05-04 NOTE — Progress Notes (Signed)
Summary: U/a order canceled  Phone Note Call from Patient   Summary of Call: Pt brought in unmarked urine specimen to lab. Due to policy we are unable to accept urine that is not labeled correctly. Pt was advised of this and refused to leave a new specimen or take a new sterile cup to bring back. U/a order was canceled, you may order when pt is in the office if you would like these results.  Initial call taken by: Lamar Sprinkles, CMA,  April 22, 2010 10:33 AM  Follow-up for Phone Call        noted Thank you!  Follow-up by: Tresa Garter MD,  April 23, 2010 10:12 PM

## 2010-05-12 NOTE — Miscellaneous (Signed)
Summary: Shave Skin Bx/[facility]  Shave Skin Bx/[facility]   Imported By: Sherian Rein 05/04/2010 09:53:59  _____________________________________________________________________  External Attachment:    Type:   Image     Comment:   External Document

## 2010-06-13 ENCOUNTER — Telehealth: Payer: Self-pay | Admitting: Internal Medicine

## 2010-06-23 NOTE — Progress Notes (Signed)
Summary: REQ FOR ABX   Phone Note Call from Patient Call back at [phone]   Summary of Call: Patient is requesting to try antibiotic for possible prostate problem. Say he discussed this w/MD in the past.  Initial call taken by: Lamar Sprinkles, CMA,  June 13, 2010 2:46 PM  Follow-up for Phone Call        OK Cipro Follow-up by: Tresa Garter MD,  June 13, 2010 6:48 PM  Additional Follow-up for Phone Call Additional follow up Details #1::        pt informed Additional Follow-up by: Ami Bullins CMA,  June 13, 2010 7:07 PM    New/Updated Medications: CIPROFLOXACIN HCL 500 MG TABS (CIPROFLOXACIN HCL) 1 by mouth bid Prescriptions: CIPROFLOXACIN HCL 500 MG TABS (CIPROFLOXACIN HCL) 1 by mouth bid  #40 x 1   Entered by:   Ami Bullins CMA   Authorized by:   Tresa Garter MD   Signed by:   Bill Salinas CMA on 06/13/2010   Method used:   Electronically to        [id number] (retail)       [address]       [place], [place]  [address]       Ph: [phone]       Fax: [phone]   RxID:   [id number]

## 2010-08-19 ENCOUNTER — Encounter: Payer: Self-pay | Admitting: Internal Medicine

## 2010-10-24 ENCOUNTER — Encounter: Payer: Self-pay | Admitting: Internal Medicine

## 2010-10-27 ENCOUNTER — Ambulatory Visit: Payer: BC Managed Care – PPO | Admitting: Internal Medicine

## 2010-11-30 ENCOUNTER — Ambulatory Visit (INDEPENDENT_AMBULATORY_CARE_PROVIDER_SITE_OTHER): Payer: BC Managed Care – PPO | Admitting: Internal Medicine

## 2010-11-30 ENCOUNTER — Encounter: Payer: Self-pay | Admitting: Internal Medicine

## 2010-11-30 ENCOUNTER — Other Ambulatory Visit (INDEPENDENT_AMBULATORY_CARE_PROVIDER_SITE_OTHER): Payer: BC Managed Care – PPO

## 2010-11-30 VITALS — BP 134/82 | HR 72 | Temp 98.4°F | Resp 16 | Wt 228.0 lb

## 2010-11-30 DIAGNOSIS — F411 Generalized anxiety disorder: Secondary | ICD-10-CM

## 2010-11-30 DIAGNOSIS — I10 Essential (primary) hypertension: Secondary | ICD-10-CM

## 2010-11-30 DIAGNOSIS — E291 Testicular hypofunction: Secondary | ICD-10-CM | POA: Insufficient documentation

## 2010-11-30 DIAGNOSIS — IMO0001 Reserved for inherently not codable concepts without codable children: Secondary | ICD-10-CM

## 2010-11-30 DIAGNOSIS — Z01818 Encounter for other preprocedural examination: Secondary | ICD-10-CM | POA: Insufficient documentation

## 2010-11-30 LAB — CBC WITH DIFFERENTIAL/PLATELET
Basophils Relative: 0.5 % (ref 0.0–3.0)
Eosinophils Relative: 1.9 % (ref 0.0–5.0)
HCT: 45.4 % (ref 39.0–52.0)
MCV: 95.8 fl (ref 78.0–100.0)
Monocytes Absolute: 0.6 10*3/uL (ref 0.1–1.0)
Neutrophils Relative %: 69.2 % (ref 43.0–77.0)
RBC: 4.74 Mil/uL (ref 4.22–5.81)
WBC: 7.1 10*3/uL (ref 4.5–10.5)

## 2010-11-30 LAB — COMPREHENSIVE METABOLIC PANEL
Albumin: 4 g/dL (ref 3.5–5.2)
Alkaline Phosphatase: 50 U/L (ref 39–117)
BUN: 13 mg/dL (ref 6–23)
Glucose, Bld: 107 mg/dL — ABNORMAL HIGH (ref 70–99)
Total Bilirubin: 0.8 mg/dL (ref 0.3–1.2)

## 2010-11-30 MED ORDER — TRAMADOL HCL 50 MG PO TABS: 50.0000 mg | ORAL_TABLET | Freq: Every day | ORAL | Status: DC

## 2010-11-30 MED ORDER — MELOXICAM 15 MG PO TABS: 15.0000 mg | ORAL_TABLET | Freq: Every day | ORAL | Status: DC

## 2010-11-30 NOTE — Progress Notes (Signed)
  Subjective:    Patient ID: Lee Owens, male    DOB: 11-26-1950, 60 y.o.   MRN: [id number]  HPI Surgical clearance for B knee arthroscopy Requested by Dr Thurston Hole Planned surgical date: Sept 21st . The patient is here to follow up on chronic HTN, dyslipidemia, insomnia and chronic moderate spastic fibromyalgia symptoms controlled some with medicines, diet and exercise. C/o ED.  Review of Systems  Constitutional: Negative for appetite change, fatigue and unexpected weight change.  HENT: Negative for nosebleeds, congestion, sore throat, sneezing, trouble swallowing and neck pain.   Eyes: Negative for itching and visual disturbance.  Respiratory: Negative for cough.   Cardiovascular: Negative for chest pain, palpitations and leg swelling.  Gastrointestinal: Negative for nausea, diarrhea, blood in stool and abdominal distention.  Genitourinary: Negative for frequency and hematuria.  Musculoskeletal: Negative for back pain, joint swelling and gait problem.  Skin: Negative for rash.  Neurological: Negative for dizziness, tremors, speech difficulty and weakness.  Psychiatric/Behavioral: Negative for sleep disturbance, dysphoric mood and agitation. The patient is not nervous/anxious.        Objective:   Physical Exam  Constitutional: He is oriented to person, place, and time. He appears well-developed.  HENT:  Mouth/Throat: Oropharynx is clear and moist.  Eyes: Conjunctivae are normal. Pupils are equal, round, and reactive to light.  Neck: Normal range of motion. No JVD present. No thyromegaly present.  Cardiovascular: Normal rate, regular rhythm, normal heart sounds and intact distal pulses.  Exam reveals no gallop and no friction rub.   No murmur heard. Pulmonary/Chest: Effort normal and breath sounds normal. No respiratory distress. He has no wheezes. He has no rales. He exhibits no tenderness.  Abdominal: Soft. Bowel sounds are normal. He exhibits no distension and no mass. There  is no tenderness. There is no rebound and no guarding.  Musculoskeletal: Normal range of motion. He exhibits no edema and no tenderness.  Lymphadenopathy:    He has no cervical adenopathy.  Neurological: He is alert and oriented to person, place, and time. He has normal reflexes. No cranial nerve deficit. He exhibits normal muscle tone. Coordination normal.  Skin: Skin is warm and dry. No rash noted.  Psychiatric: He has a normal mood and affect. His behavior is normal. Judgment and thought content normal.   Wax B       Assessment & Plan:

## 2010-11-30 NOTE — Patient Instructions (Signed)
Take Lisinopril 10 mg 1/2 in am

## 2010-12-01 LAB — URINALYSIS
Bilirubin Urine: NEGATIVE
Nitrite: NEGATIVE
Specific Gravity, Urine: 1.02 (ref 1.000–1.030)
Total Protein, Urine: NEGATIVE
pH: 5.5 (ref 5.0–8.0)

## 2010-12-01 LAB — LUTEINIZING HORMONE: LH: 4.28 m[IU]/mL (ref 1.50–9.30)

## 2010-12-04 ENCOUNTER — Encounter: Payer: Self-pay | Admitting: Internal Medicine

## 2010-12-04 NOTE — Assessment & Plan Note (Signed)
Discussed.Treatment options are discussed 

## 2010-12-04 NOTE — Assessment & Plan Note (Signed)
Continue with current prescription therapy as reflected on the Med list. Lab Results  Component Value Date   WBC 7.1 11/30/2010   HGB 15.3 11/30/2010   HCT 45.4 11/30/2010   PLT 235.0 11/30/2010   CHOL 170 04/22/2010   TRIG 114.0 04/22/2010   HDL 51.40 04/22/2010   ALT 38 11/30/2010   AST 23 11/30/2010   NA 140 11/30/2010   K 3.9 11/30/2010   CL 103 11/30/2010   CREATININE 0.8 11/30/2010   BUN 13 11/30/2010   CO2 28 11/30/2010   TSH 0.85 11/30/2010   PSA 4.13* 04/22/2010   HGBA1C 5.6 01/15/2009   BP Readings from Last 3 Encounters:  11/30/10 134/82  04/28/10 110/74  10/29/09 120/86

## 2010-12-04 NOTE — Assessment & Plan Note (Signed)
Discussed.Treatment options are discussed

## 2010-12-04 NOTE — Assessment & Plan Note (Addendum)
He is medically clear for B arthroscopic knee surgery.

## 2011-04-14 ENCOUNTER — Telehealth: Payer: Self-pay | Admitting: *Deleted

## 2011-04-14 NOTE — Telephone Encounter (Signed)
Pt at pharmacy w/dizziness, lightheaded and hypotension [91/50] after taking Rapiflo prescribed by Urologist [in conjunction with Doxazosin]. Per MD, stop Rapiflo & call Urologist Monday morning about this issue, come to office if needed today [without driving], push fluids [drink a soda/Coke]. Pharmacist relayed these instructions to patient and will call his wife Lee Owens is nurse] to drive him home.

## 2011-04-17 NOTE — Telephone Encounter (Signed)
Noted - it was addressed in  Real time Thx

## 2011-05-19 ENCOUNTER — Other Ambulatory Visit: Payer: Self-pay | Admitting: Internal Medicine

## 2011-07-31 ENCOUNTER — Other Ambulatory Visit: Payer: Self-pay | Admitting: *Deleted

## 2011-07-31 MED ORDER — MELOXICAM 15 MG PO TABS: 15.0000 mg | ORAL_TABLET | Freq: Every day | ORAL | Status: DC

## 2011-08-09 ENCOUNTER — Telehealth: Payer: Self-pay | Admitting: *Deleted

## 2011-08-09 DIAGNOSIS — Z0000 Encounter for general adult medical examination without abnormal findings: Secondary | ICD-10-CM

## 2011-08-09 DIAGNOSIS — Z125 Encounter for screening for malignant neoplasm of prostate: Secondary | ICD-10-CM

## 2011-08-09 NOTE — Telephone Encounter (Signed)
Message copied by Deatra James on Wed Aug 09, 2011 12:02 PM ------      Message from: COUSIN, [place] T      Created: Fri Jul 21, 2011  4:00 PM      Regarding: PHY DATE 09/27/11       THANKS

## 2011-08-09 NOTE — Telephone Encounter (Signed)
Received staff msg pt made cpx for 09/27/11 need labs entered in epic.... 5/16/13212:03pm/LMB

## 2011-09-21 ENCOUNTER — Other Ambulatory Visit (INDEPENDENT_AMBULATORY_CARE_PROVIDER_SITE_OTHER): Payer: BC Managed Care – PPO

## 2011-09-21 DIAGNOSIS — Z0000 Encounter for general adult medical examination without abnormal findings: Secondary | ICD-10-CM

## 2011-09-21 DIAGNOSIS — Z01818 Encounter for other preprocedural examination: Secondary | ICD-10-CM

## 2011-09-21 DIAGNOSIS — Z125 Encounter for screening for malignant neoplasm of prostate: Secondary | ICD-10-CM

## 2011-09-21 DIAGNOSIS — E291 Testicular hypofunction: Secondary | ICD-10-CM

## 2011-09-21 LAB — BASIC METABOLIC PANEL
BUN: 14 mg/dL (ref 6–23)
Calcium: 9.6 mg/dL (ref 8.4–10.5)
Creatinine, Ser: 0.8 mg/dL (ref 0.4–1.5)
GFR: 107.75 mL/min (ref >60.00)
Glucose, Bld: 114 mg/dL — ABNORMAL HIGH (ref 70–99)
Sodium: 137 mEq/L (ref 135–145)

## 2011-09-21 LAB — HEPATIC FUNCTION PANEL
ALT: 30 U/L (ref 0–53)
AST: 20 U/L (ref 0–37)
Albumin: 4.2 g/dL (ref 3.5–5.2)
Total Protein: 6.8 g/dL (ref 6.0–8.3)

## 2011-09-21 LAB — URINALYSIS, ROUTINE W REFLEX MICROSCOPIC
Bilirubin Urine: NEGATIVE
Hgb urine dipstick: NEGATIVE
Ketones, ur: NEGATIVE
Total Protein, Urine: NEGATIVE
Urine Glucose: NEGATIVE
Urobilinogen, UA: 0.2 (ref 0.0–1.0)

## 2011-09-21 LAB — CBC WITH DIFFERENTIAL/PLATELET
Basophils Absolute: 0.1 10*3/uL (ref 0.0–0.1)
Eosinophils Relative: 1.2 % (ref 0.0–5.0)
Lymphocytes Relative: 23.6 % (ref 12.0–46.0)
Monocytes Relative: 7.6 % (ref 3.0–12.0)
Neutrophils Relative %: 66.8 % (ref 43.0–77.0)
Platelets: 237 10*3/uL (ref 150.0–400.0)
RDW: 12.6 % (ref 11.5–14.6)
WBC: 6.6 10*3/uL (ref 4.5–10.5)

## 2011-09-21 LAB — LIPID PANEL
Cholesterol: 187 mg/dL (ref 0–200)
HDL: 57.7 mg/dL (ref >39.00)
Total CHOL/HDL Ratio: 3
Triglycerides: 101 mg/dL (ref 0.0–149.0)

## 2011-09-24 ENCOUNTER — Encounter: Payer: Self-pay | Admitting: Internal Medicine

## 2011-09-27 ENCOUNTER — Encounter: Payer: Self-pay | Admitting: Internal Medicine

## 2011-09-27 ENCOUNTER — Ambulatory Visit (INDEPENDENT_AMBULATORY_CARE_PROVIDER_SITE_OTHER): Payer: BC Managed Care – PPO | Admitting: Internal Medicine

## 2011-09-27 VITALS — BP 150/98 | HR 80 | Temp 98.6°F | Resp 16 | Ht 73.0 in | Wt 223.0 lb

## 2011-09-27 DIAGNOSIS — Z0000 Encounter for general adult medical examination without abnormal findings: Secondary | ICD-10-CM

## 2011-09-27 DIAGNOSIS — I10 Essential (primary) hypertension: Secondary | ICD-10-CM

## 2011-09-27 DIAGNOSIS — E291 Testicular hypofunction: Secondary | ICD-10-CM

## 2011-09-27 DIAGNOSIS — E538 Deficiency of other specified B group vitamins: Secondary | ICD-10-CM

## 2011-09-27 DIAGNOSIS — E559 Vitamin D deficiency, unspecified: Secondary | ICD-10-CM

## 2011-09-27 DIAGNOSIS — M1710 Unilateral primary osteoarthritis, unspecified knee: Secondary | ICD-10-CM | POA: Insufficient documentation

## 2011-09-27 DIAGNOSIS — R5381 Other malaise: Secondary | ICD-10-CM

## 2011-09-27 DIAGNOSIS — R972 Elevated prostate specific antigen [PSA]: Secondary | ICD-10-CM

## 2011-09-27 DIAGNOSIS — M179 Osteoarthritis of knee, unspecified: Secondary | ICD-10-CM

## 2011-09-27 DIAGNOSIS — E785 Hyperlipidemia, unspecified: Secondary | ICD-10-CM

## 2011-09-27 DIAGNOSIS — IMO0001 Reserved for inherently not codable concepts without codable children: Secondary | ICD-10-CM

## 2011-09-27 MED ORDER — TESTOSTERONE CYPIONATE 200 MG/ML IM SOLN: 200.0000 mg | INTRAMUSCULAR | Status: DC

## 2011-09-27 MED ORDER — FENTANYL 12 MCG/HR TD PT72: 1.0000 | MEDICATED_PATCH | TRANSDERMAL | Status: DC

## 2011-09-27 MED ORDER — NABUMETONE 750 MG PO TABS: 750.0000 mg | ORAL_TABLET | Freq: Two times a day (BID) | ORAL | Status: DC

## 2011-09-27 NOTE — Assessment & Plan Note (Signed)
2012 Dr Retta Diones - neg bx On Proscar

## 2011-09-27 NOTE — Assessment & Plan Note (Signed)
Testosterone may help

## 2011-09-27 NOTE — Progress Notes (Signed)
Patient ID: Lee Owens, male   DOB: January 12, 1951, 61 y.o.   MRN: [id number]  Subjective:    Patient ID: Lee Owens, male    DOB: 06-21-1950, 61 y.o.   MRN: [id number]  HPI The patient is here for a wellness exam. The patient has been doing well overall without major physical or psychological issues going on lately.  He had B knee arthroscopy by  Dr Thurston Hole in Sept 21st 2012 . The patient is here to follow up on chronic HTN, dyslipidemia, insomnia and chronic moderate spastic fibromyalgia/CFS symptoms controlled some with medicines, diet and exercise. C/o ED.  Knee pain B is worse 7/10 all the time  Review of Systems  Constitutional: Positive for fatigue. Negative for appetite change and unexpected weight change.  HENT: Negative for nosebleeds, congestion, sore throat, sneezing, trouble swallowing and neck pain.   Eyes: Negative for itching and visual disturbance.  Respiratory: Negative for cough.   Cardiovascular: Negative for chest pain, palpitations and leg swelling.  Gastrointestinal: Negative for nausea, diarrhea, blood in stool and abdominal distention.  Genitourinary: Negative for frequency and hematuria.  Musculoskeletal: Positive for myalgias, back pain and arthralgias. Negative for joint swelling and gait problem.  Skin: Negative for rash.  Neurological: Negative for dizziness, tremors, speech difficulty and weakness.  Psychiatric/Behavioral: Positive for disturbed wake/sleep cycle. Negative for suicidal ideas, dysphoric mood and agitation. The patient is not nervous/anxious.        Objective:   Physical Exam  Constitutional: He is oriented to person, place, and time. He appears well-developed. No distress.       obese  HENT:  Mouth/Throat: Oropharynx is clear and moist.  Eyes: Conjunctivae are normal. Pupils are equal, round, and reactive to light.  Neck: Normal range of motion. No JVD present. No thyromegaly present.  Cardiovascular: Normal rate, regular rhythm,  normal heart sounds and intact distal pulses.  Exam reveals no gallop and no friction rub.   No murmur heard. Pulmonary/Chest: Effort normal and breath sounds normal. No respiratory distress. He has no wheezes. He has no rales. He exhibits no tenderness.  Abdominal: Soft. Bowel sounds are normal. He exhibits no distension and no mass. There is no tenderness. There is no rebound and no guarding.  Genitourinary:       Refused - def to Urol  Musculoskeletal: He exhibits edema and tenderness.       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  Neurological: He is alert and oriented to person, place, and time. He has normal reflexes. No cranial nerve deficit. He exhibits normal muscle tone. Coordination normal.  Skin: Skin is warm and dry. No rash noted. No erythema.  Psychiatric: He has a normal mood and affect. His behavior is normal. Judgment and thought content normal.   Lab Results  Component Value Date   WBC 6.6 09/21/2011   HGB 16.2 09/21/2011   HCT 47.8 09/21/2011   PLT 237.0 09/21/2011   GLUCOSE 114* 09/21/2011   CHOL 187 09/21/2011   TRIG 101.0 09/21/2011   HDL 57.70 09/21/2011   LDLCALC 109* 09/21/2011   ALT 30 09/21/2011   AST 20 09/21/2011   NA 137 09/21/2011   K 4.2 09/21/2011   CL 104 09/21/2011   CREATININE 0.8 09/21/2011   BUN 14 09/21/2011   CO2 27 09/21/2011   TSH 0.58 09/21/2011   PSA 2.41 09/21/2011   HGBA1C 5.6 01/15/2009  Assessment & Plan:

## 2011-09-27 NOTE — Assessment & Plan Note (Signed)
Continue with current prescription therapy as reflected on the Med list.  

## 2011-09-27 NOTE — Assessment & Plan Note (Addendum)
Discussed. Treatment options are discussed. He will run it by Dr Retta Diones to make sure he has no objections

## 2011-09-27 NOTE — Assessment & Plan Note (Signed)
Chronic 2013 - worse, esp B knee pain Start Duragesic patch Cont Tramadol prn Relafen

## 2011-09-27 NOTE — Assessment & Plan Note (Signed)
?  synvisc See pain meds

## 2011-09-27 NOTE — Assessment & Plan Note (Signed)
We discussed age appropriate health related issues, including available/recomended screening tests and vaccinations. We discussed a need for adhering to healthy diet and exercise. Labs/EKG were reviewed/ordered. All questions were answered.   

## 2011-09-27 NOTE — Assessment & Plan Note (Signed)
Labs

## 2011-10-10 ENCOUNTER — Telehealth: Payer: Self-pay | Admitting: Internal Medicine

## 2011-10-10 NOTE — Telephone Encounter (Signed)
Caller: Mary/Spouse; Phone Number: [phone]; Message from caller: Wife calling about preauthorization need for the Testosterone  injections.  Same is for [facility].

## 2011-10-12 ENCOUNTER — Telehealth: Payer: Self-pay | Admitting: Internal Medicine

## 2011-10-12 NOTE — Telephone Encounter (Signed)
PA form faxed. Pt's wife informed. Will hold phone note open for Ins. Decision.

## 2011-10-12 NOTE — Telephone Encounter (Signed)
Caller: Lee Owens/Spouse; PCP: Sonda Primes; CB#: [phone]. Call regarding Increase in Pain Despite Meds; Lee Owens calling to advise this gentleman is having increased pain in knees despite the Fentanyl 12.5mg  patch, Tramadol and Relafen that he was given when last seen on 7/3. Notes in EPIC re last visit reviewed. Lee Owens has had no new injuries to cause increase in pain. Wife reports it is just getting worse and he has had pain for years. Caller asking for direction. Pharmacy is [facility] on [address]. Caller advised a note will be sent with this info for PCP. She can be reached at above #.

## 2011-10-13 MED ORDER — FENTANYL 25 MCG/HR TD PT72: 1.0000 | MEDICATED_PATCH | TRANSDERMAL | Status: DC

## 2011-10-13 NOTE — Telephone Encounter (Signed)
Pt's wife informed new Rx upfront.

## 2011-10-13 NOTE — Telephone Encounter (Signed)
Increase fentanyl patch to 25 mcg Thx

## 2011-10-17 NOTE — Telephone Encounter (Signed)
Called ins to check status of PA. They are requesting pt's testosterone labs be faxed to them. Labs faxed to them.

## 2011-10-19 NOTE — Telephone Encounter (Signed)
He should ask Dr Retta Diones what he should do since Dr D. has ordered this test. Thx

## 2011-10-19 NOTE — Telephone Encounter (Signed)
PA for Testosterone is approved. I informed pt. He states Dr. Retta Diones checked a free testosterone and informed him that the number in normal. He wants to know if he should start the testosterone or not. Please advise.

## 2011-10-20 NOTE — Telephone Encounter (Signed)
Pt informed

## 2011-11-17 ENCOUNTER — Other Ambulatory Visit: Payer: Self-pay

## 2011-11-20 ENCOUNTER — Other Ambulatory Visit: Payer: Self-pay | Admitting: Internal Medicine

## 2011-11-21 ENCOUNTER — Other Ambulatory Visit: Payer: Self-pay | Admitting: Internal Medicine

## 2011-11-21 MED ORDER — FENTANYL 25 MCG/HR TD PT72: 1.0000 | MEDICATED_PATCH | TRANSDERMAL | Status: DC

## 2011-11-21 NOTE — Telephone Encounter (Signed)
OK to fill this prescription with additional refills x0 Thank you!  

## 2011-11-21 NOTE — Telephone Encounter (Signed)
Notified pt spoke with wife rx ready for pick-up...Raechel Chute

## 2011-12-10 ENCOUNTER — Other Ambulatory Visit: Payer: Self-pay | Admitting: Internal Medicine

## 2011-12-11 ENCOUNTER — Other Ambulatory Visit: Payer: Self-pay | Admitting: General Practice

## 2011-12-11 MED ORDER — LOVASTATIN 40 MG PO TABS: 40.0000 mg | ORAL_TABLET | Freq: Every day | ORAL | Status: DC

## 2011-12-22 ENCOUNTER — Other Ambulatory Visit: Payer: Self-pay

## 2011-12-23 ENCOUNTER — Other Ambulatory Visit: Payer: Self-pay | Admitting: Internal Medicine

## 2011-12-25 MED ORDER — FENTANYL 25 MCG/HR TD PT72: 1.0000 | MEDICATED_PATCH | TRANSDERMAL | Status: DC

## 2011-12-27 ENCOUNTER — Encounter: Payer: Self-pay | Admitting: Internal Medicine

## 2011-12-27 ENCOUNTER — Ambulatory Visit (INDEPENDENT_AMBULATORY_CARE_PROVIDER_SITE_OTHER): Payer: BC Managed Care – PPO | Admitting: Internal Medicine

## 2011-12-27 VITALS — BP 140/100 | HR 80 | Temp 98.7°F | Resp 16 | Wt 214.0 lb

## 2011-12-27 DIAGNOSIS — E291 Testicular hypofunction: Secondary | ICD-10-CM

## 2011-12-27 DIAGNOSIS — G479 Sleep disorder, unspecified: Secondary | ICD-10-CM

## 2011-12-27 DIAGNOSIS — I10 Essential (primary) hypertension: Secondary | ICD-10-CM

## 2011-12-27 DIAGNOSIS — F411 Generalized anxiety disorder: Secondary | ICD-10-CM

## 2011-12-27 DIAGNOSIS — M1710 Unilateral primary osteoarthritis, unspecified knee: Secondary | ICD-10-CM

## 2011-12-27 DIAGNOSIS — E538 Deficiency of other specified B group vitamins: Secondary | ICD-10-CM

## 2011-12-27 DIAGNOSIS — Z23 Encounter for immunization: Secondary | ICD-10-CM

## 2011-12-27 DIAGNOSIS — IMO0001 Reserved for inherently not codable concepts without codable children: Secondary | ICD-10-CM

## 2011-12-27 MED ORDER — FENTANYL 25 MCG/HR TD PT72: 1.0000 | MEDICATED_PATCH | TRANSDERMAL | Status: DC

## 2011-12-27 MED ORDER — OXYCODONE HCL 5 MG PO TABS: 5.0000 mg | ORAL_TABLET | Freq: Four times a day (QID) | ORAL | Status: DC | PRN

## 2011-12-27 MED ORDER — NABUMETONE 750 MG PO TABS: 750.0000 mg | ORAL_TABLET | Freq: Two times a day (BID) | ORAL | Status: DC | PRN

## 2011-12-27 NOTE — Progress Notes (Signed)
Subjective:    Patient ID: Lee Owens, male    DOB: 17-Apr-1950, 61 y.o.   MRN: [id number]  HPI The patient is here for a wellness exam. The patient has been doing well overall without major physical or psychological issues going on lately.  He had B knee arthroscopy by  Dr Thurston Hole in Sept 21st 2012 - now he was suggested TKR B . The patient is here to follow up on chronic HTN, dyslipidemia, insomnia and chronic moderate spastic fibromyalgia/CFS symptoms controlled some with medicines. FMS is 80% better on Fentanyl/Tramadol C/o ED.  Knee pain B is worse 7-9/10 all the time - pain meds do not help with knee pain...  Review of Systems  Constitutional: Positive for fatigue. Negative for appetite change and unexpected weight change.  HENT: Negative for nosebleeds, congestion, sore throat, sneezing, trouble swallowing and neck pain.   Eyes: Negative for itching and visual disturbance.  Respiratory: Negative for cough.   Cardiovascular: Negative for chest pain, palpitations and leg swelling.  Gastrointestinal: Negative for nausea, diarrhea, blood in stool and abdominal distention.  Genitourinary: Negative for frequency and hematuria.  Musculoskeletal: Positive for myalgias, back pain and arthralgias. Negative for joint swelling and gait problem.  Skin: Negative for rash.  Neurological: Negative for dizziness, tremors, speech difficulty and weakness.  Psychiatric/Behavioral: Positive for disturbed wake/sleep cycle. Negative for suicidal ideas, dysphoric mood and agitation. The patient is not nervous/anxious.        Objective:   Physical Exam  Constitutional: He is oriented to person, place, and time. He appears well-developed. No distress.       obese  HENT:  Mouth/Throat: Oropharynx is clear and moist.  Eyes: Conjunctivae normal are normal. Pupils are equal, round, and reactive to light.  Neck: Normal range of motion. No JVD present. No thyromegaly present.  Cardiovascular:  Normal rate, regular rhythm, normal heart sounds and intact distal pulses.  Exam reveals no gallop and no friction rub.   No murmur heard. Pulmonary/Chest: Effort normal and breath sounds normal. No respiratory distress. He has no wheezes. He has no rales. He exhibits no tenderness.  Abdominal: Soft. Bowel sounds are normal. He exhibits no distension and no mass. There is no tenderness. There is no rebound and no guarding.  Genitourinary:       Refused - def to Urol  Musculoskeletal: He exhibits edema and tenderness.       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  Neurological: He is alert and oriented to person, place, and time. He has normal reflexes. No cranial nerve deficit. He exhibits normal muscle tone. Coordination normal.  Skin: Skin is warm and dry. No rash noted. No erythema.  Psychiatric: He has a normal mood and affect. His behavior is normal. Judgment and thought content normal.   Lab Results  Component Value Date   WBC 6.6 09/21/2011   HGB 16.2 09/21/2011   HCT 47.8 09/21/2011   PLT 237.0 09/21/2011   GLUCOSE 114* 09/21/2011   CHOL 187 09/21/2011   TRIG 101.0 09/21/2011   HDL 57.70 09/21/2011   LDLCALC 109* 09/21/2011   ALT 30 09/21/2011   AST 20 09/21/2011   NA 137 09/21/2011   K 4.2 09/21/2011   CL 104 09/21/2011   CREATININE 0.8 09/21/2011   BUN 14 09/21/2011   CO2 27 09/21/2011   TSH 0.58 09/21/2011   PSA 2.41 09/21/2011   HGBA1C 5.6 01/15/2009  Assessment & Plan:

## 2011-12-27 NOTE — Assessment & Plan Note (Signed)
Better  

## 2011-12-27 NOTE — Assessment & Plan Note (Signed)
Continue with current prescription therapy as reflected on the Med list.  

## 2011-12-27 NOTE — Assessment & Plan Note (Signed)
Not on Rx 

## 2011-12-27 NOTE — Assessment & Plan Note (Signed)
He will ask bout Synvisc

## 2012-04-03 ENCOUNTER — Ambulatory Visit (INDEPENDENT_AMBULATORY_CARE_PROVIDER_SITE_OTHER): Payer: BC Managed Care – PPO | Admitting: Internal Medicine

## 2012-04-03 ENCOUNTER — Encounter: Payer: Self-pay | Admitting: Internal Medicine

## 2012-04-03 VITALS — BP 130/80 | HR 80 | Temp 98.0°F | Resp 16 | Wt 207.0 lb

## 2012-04-03 DIAGNOSIS — IMO0001 Reserved for inherently not codable concepts without codable children: Secondary | ICD-10-CM

## 2012-04-03 DIAGNOSIS — E291 Testicular hypofunction: Secondary | ICD-10-CM

## 2012-04-03 DIAGNOSIS — E559 Vitamin D deficiency, unspecified: Secondary | ICD-10-CM

## 2012-04-03 DIAGNOSIS — M1710 Unilateral primary osteoarthritis, unspecified knee: Secondary | ICD-10-CM

## 2012-04-03 DIAGNOSIS — R5381 Other malaise: Secondary | ICD-10-CM

## 2012-04-03 DIAGNOSIS — I10 Essential (primary) hypertension: Secondary | ICD-10-CM

## 2012-04-03 DIAGNOSIS — K5900 Constipation, unspecified: Secondary | ICD-10-CM

## 2012-04-03 DIAGNOSIS — E538 Deficiency of other specified B group vitamins: Secondary | ICD-10-CM

## 2012-04-03 MED ORDER — LINACLOTIDE 290 MCG PO CAPS: 1.0000 | ORAL_CAPSULE | Freq: Every day | ORAL | Status: DC

## 2012-04-03 MED ORDER — FENTANYL 25 MCG/HR TD PT72: 1.0000 | MEDICATED_PATCH | TRANSDERMAL | Status: DC

## 2012-04-03 NOTE — Assessment & Plan Note (Signed)
Linzess prn 

## 2012-04-03 NOTE — Progress Notes (Signed)
   Subjective:    HPI    He had B knee arthroscopy by  Dr Thurston Hole in Sept 21st 2012 - now he was suggested TKR B  The patient is here to follow up on chronic HTN, dyslipidemia, insomnia and chronic moderate spastic fibromyalgia/CFS symptoms controlled some with medicines. FMS is 80% better on Fentanyl/Oxy C/o ED. C/o constipation  Knee pain B is worse 7-9/10 all the time - pain meds do not help with knee pain...  Review of Systems  Constitutional: Positive for fatigue. Negative for appetite change and unexpected weight change.  HENT: Negative for nosebleeds, congestion, sore throat, sneezing, trouble swallowing and neck pain.   Eyes: Negative for itching and visual disturbance.  Respiratory: Negative for cough.   Cardiovascular: Negative for chest pain, palpitations and leg swelling.  Gastrointestinal: Negative for nausea, diarrhea, blood in stool and abdominal distention.  Genitourinary: Negative for frequency and hematuria.  Musculoskeletal: Positive for myalgias, back pain and arthralgias. Negative for joint swelling and gait problem.  Skin: Negative for rash.  Neurological: Negative for dizziness, tremors, speech difficulty and weakness.  Psychiatric/Behavioral: Positive for sleep disturbance. Negative for suicidal ideas, dysphoric mood and agitation. The patient is not nervous/anxious.        Objective:   Physical Exam  Constitutional: He is oriented to person, place, and time. He appears well-developed. No distress.       obese  HENT:  Mouth/Throat: Oropharynx is clear and moist.  Eyes: Conjunctivae normal are normal. Pupils are equal, round, and reactive to light.  Neck: Normal range of motion. No JVD present. No thyromegaly present.  Cardiovascular: Normal rate, regular rhythm, normal heart sounds and intact distal pulses.  Exam reveals no gallop and no friction rub.   No murmur heard. Pulmonary/Chest: Effort normal and breath sounds normal. No respiratory distress. He  has no wheezes. He has no rales. He exhibits no tenderness.  Abdominal: Soft. Bowel sounds are normal. He exhibits no distension and no mass. There is no tenderness. There is no rebound and no guarding.  Genitourinary:       Refused - def to Urol  Musculoskeletal: He exhibits edema and tenderness.       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  Neurological: He is alert and oriented to person, place, and time. He has normal reflexes. No cranial nerve deficit. He exhibits normal muscle tone. Coordination normal.  Skin: Skin is warm and dry. No rash noted. No erythema.  Psychiatric: He has a normal mood and affect. His behavior is normal. Judgment and thought content normal.   Lab Results  Component Value Date   WBC 6.6 09/21/2011   HGB 16.2 09/21/2011   HCT 47.8 09/21/2011   PLT 237.0 09/21/2011   GLUCOSE 114* 09/21/2011   CHOL 187 09/21/2011   TRIG 101.0 09/21/2011   HDL 57.70 09/21/2011   LDLCALC 109* 09/21/2011   ALT 30 09/21/2011   AST 20 09/21/2011   NA 137 09/21/2011   K 4.2 09/21/2011   CL 104 09/21/2011   CREATININE 0.8 09/21/2011   BUN 14 09/21/2011   CO2 27 09/21/2011   TSH 0.58 09/21/2011   PSA 2.41 09/21/2011   HGBA1C 5.6 01/15/2009          Assessment & Plan:

## 2012-04-03 NOTE — Assessment & Plan Note (Signed)
Continue with current prescription therapy as reflected on the Med list.  

## 2012-04-03 NOTE — Assessment & Plan Note (Signed)
Oxycod helped

## 2012-04-03 NOTE — Assessment & Plan Note (Addendum)
Treatment options are discussed - he is not using Rx now

## 2012-04-03 NOTE — Assessment & Plan Note (Signed)
Better  

## 2012-04-09 ENCOUNTER — Telehealth: Payer: Self-pay | Admitting: *Deleted

## 2012-04-09 DIAGNOSIS — Z0389 Encounter for observation for other suspected diseases and conditions ruled out: Secondary | ICD-10-CM

## 2012-04-09 DIAGNOSIS — Z0000 Encounter for general adult medical examination without abnormal findings: Secondary | ICD-10-CM

## 2012-04-09 NOTE — Telephone Encounter (Signed)
Message copied by Merrilyn Puma on Tue Apr 09, 2012  9:07 AM ------      Message from: Etheleen Sia      Created: Wed Apr 03, 2012  2:30 PM      Regarding: LABS       PHYSICAL LABS FOR July.

## 2012-04-09 NOTE — Telephone Encounter (Signed)
Done

## 2012-04-11 ENCOUNTER — Other Ambulatory Visit: Payer: Self-pay | Admitting: *Deleted

## 2012-04-11 MED ORDER — BACLOFEN 20 MG PO TABS: 20.0000 mg | ORAL_TABLET | Freq: Three times a day (TID) | ORAL | Status: DC

## 2012-04-24 ENCOUNTER — Other Ambulatory Visit: Payer: Self-pay | Admitting: *Deleted

## 2012-04-24 MED ORDER — NABUMETONE 750 MG PO TABS: 750.0000 mg | ORAL_TABLET | Freq: Two times a day (BID) | ORAL | Status: AC | PRN

## 2012-06-21 ENCOUNTER — Other Ambulatory Visit: Payer: Self-pay | Admitting: Internal Medicine

## 2012-07-04 ENCOUNTER — Ambulatory Visit (INDEPENDENT_AMBULATORY_CARE_PROVIDER_SITE_OTHER): Payer: BC Managed Care – PPO | Admitting: Internal Medicine

## 2012-07-04 ENCOUNTER — Encounter: Payer: Self-pay | Admitting: Internal Medicine

## 2012-07-04 VITALS — BP 136/96 | HR 80 | Temp 99.0°F | Resp 16 | Wt 210.0 lb

## 2012-07-04 DIAGNOSIS — IMO0001 Reserved for inherently not codable concepts without codable children: Secondary | ICD-10-CM

## 2012-07-04 DIAGNOSIS — R7309 Other abnormal glucose: Secondary | ICD-10-CM

## 2012-07-04 DIAGNOSIS — E559 Vitamin D deficiency, unspecified: Secondary | ICD-10-CM

## 2012-07-04 DIAGNOSIS — J069 Acute upper respiratory infection, unspecified: Secondary | ICD-10-CM

## 2012-07-04 DIAGNOSIS — E119 Type 2 diabetes mellitus without complications: Secondary | ICD-10-CM | POA: Insufficient documentation

## 2012-07-04 DIAGNOSIS — R739 Hyperglycemia, unspecified: Secondary | ICD-10-CM

## 2012-07-04 DIAGNOSIS — E538 Deficiency of other specified B group vitamins: Secondary | ICD-10-CM

## 2012-07-04 MED ORDER — OXYCODONE HCL 5 MG PO TABS: 5.0000 mg | ORAL_TABLET | Freq: Four times a day (QID) | ORAL | Status: DC | PRN

## 2012-07-04 MED ORDER — FENTANYL 25 MCG/HR TD PT72: 1.0000 | MEDICATED_PATCH | TRANSDERMAL | Status: DC

## 2012-07-04 MED ORDER — AZITHROMYCIN 250 MG PO TABS: ORAL_TABLET | ORAL | Status: DC

## 2012-07-04 NOTE — Assessment & Plan Note (Signed)
Continue with current prescription therapy as reflected on the Med list.  

## 2012-07-04 NOTE — Assessment & Plan Note (Signed)
Zpac if worse 

## 2012-07-04 NOTE — Assessment & Plan Note (Signed)
[facility] Labs

## 2012-07-11 NOTE — Progress Notes (Signed)
   Subjective:    HPI  C/o URI sx's    The patient is here to follow up on chronic HTN, dyslipidemia, insomnia and chronic moderate spastic fibromyalgia/CFS symptoms controlled some with medicines. FMS is 80% better on Fentanyl/Oxy C/o ED. C/o constipation  Knee pain B, LBP, stiffness is better on meds   Review of Systems  Constitutional: Positive for fatigue. Negative for appetite change and unexpected weight change.  HENT: Negative for nosebleeds, congestion, sore throat, sneezing, trouble swallowing and neck pain.   Eyes: Negative for itching and visual disturbance.  Respiratory: Negative for cough.   Cardiovascular: Negative for chest pain, palpitations and leg swelling.  Gastrointestinal: Negative for nausea, diarrhea, blood in stool and abdominal distention.  Genitourinary: Negative for frequency and hematuria.  Musculoskeletal: Positive for myalgias, back pain and arthralgias. Negative for joint swelling and gait problem.  Skin: Negative for rash.  Neurological: Negative for dizziness, tremors, speech difficulty and weakness.  Psychiatric/Behavioral: Positive for sleep disturbance. Negative for suicidal ideas, dysphoric mood and agitation. The patient is not nervous/anxious.        Objective:   Physical Exam  Constitutional: He is oriented to person, place, and time. He appears well-developed. No distress.  obese  HENT:  Mouth/Throat: Oropharynx is clear and moist.  Eyes: Conjunctivae are normal. Pupils are equal, round, and reactive to light.  Neck: Normal range of motion. No JVD present. No thyromegaly present.  Cardiovascular: Normal rate, regular rhythm, normal heart sounds and intact distal pulses.  Exam reveals no gallop and no friction rub.   No murmur heard. Pulmonary/Chest: Effort normal and breath sounds normal. No respiratory distress. He has no wheezes. He has no rales. He exhibits no tenderness.  Abdominal: Soft. Bowel sounds are normal. He exhibits no  distension and no mass. There is no tenderness. There is no rebound and no guarding.  Genitourinary:  Refused - def to Urol  Musculoskeletal: He exhibits edema and tenderness.  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  Neurological: He is alert and oriented to person, place, and time. He has normal reflexes. No cranial nerve deficit. He exhibits normal muscle tone. Coordination normal.  Skin: Skin is warm and dry. No rash noted. No erythema.  Psychiatric: He has a normal mood and affect. His behavior is normal. Judgment and thought content normal.   Lab Results  Component Value Date   WBC 6.6 09/21/2011   HGB 16.2 09/21/2011   HCT 47.8 09/21/2011   PLT 237.0 09/21/2011   GLUCOSE 114* 09/21/2011   CHOL 187 09/21/2011   TRIG 101.0 09/21/2011   HDL 57.70 09/21/2011   LDLCALC 109* 09/21/2011   ALT 30 09/21/2011   AST 20 09/21/2011   NA 137 09/21/2011   K 4.2 09/21/2011   CL 104 09/21/2011   CREATININE 0.8 09/21/2011   BUN 14 09/21/2011   CO2 27 09/21/2011   TSH 0.58 09/21/2011   PSA 2.41 09/21/2011   HGBA1C 5.6 01/15/2009          Assessment & Plan:

## 2012-09-04 ENCOUNTER — Telehealth: Payer: Self-pay

## 2012-09-04 NOTE — Telephone Encounter (Signed)
Sarah w/[facility] called [facility] stating that they received a hard script for Fentnyl 25 mg but they only had 1 box available which is a 15 day supply. They state that they will need another script so that pt can feel in two weeks. Thanks

## 2012-09-11 ENCOUNTER — Telehealth: Payer: Self-pay | Admitting: *Deleted

## 2012-09-11 NOTE — Telephone Encounter (Signed)
l Documentation    Sandi Mealy, CMA at 09/04/2012 11:11 AM    Status: Signed             Sarah w/[facility] called [facility] stating that they received a hard script for Fentnyl 25 mg but they only had 1 box available which is a 15 day supply. They state that they will need another script so that pt can feel in two weeks. Thanks

## 2012-09-12 ENCOUNTER — Other Ambulatory Visit: Payer: Self-pay | Admitting: Internal Medicine

## 2012-09-12 MED ORDER — FENTANYL 25 MCG/HR TD PT72: 1.0000 | MEDICATED_PATCH | TRANSDERMAL | Status: DC

## 2012-09-12 NOTE — Telephone Encounter (Signed)
Ok Thx 

## 2012-09-12 NOTE — Telephone Encounter (Signed)
New Rx for # 5 is needed. Rx printed/pending MD sig. Left detailed mess informing pt's wife Rx will be ready for p/u after 8 am on 09/13/12.

## 2012-09-19 ENCOUNTER — Telehealth: Payer: Self-pay | Admitting: Internal Medicine

## 2012-09-19 NOTE — Telephone Encounter (Signed)
Rec'd from [facility] forward 3 pages to [facility]

## 2012-09-24 ENCOUNTER — Other Ambulatory Visit (INDEPENDENT_AMBULATORY_CARE_PROVIDER_SITE_OTHER): Payer: BC Managed Care – PPO

## 2012-09-24 DIAGNOSIS — Z0389 Encounter for observation for other suspected diseases and conditions ruled out: Secondary | ICD-10-CM

## 2012-09-24 DIAGNOSIS — Z0000 Encounter for general adult medical examination without abnormal findings: Secondary | ICD-10-CM

## 2012-09-24 LAB — URINALYSIS, ROUTINE W REFLEX MICROSCOPIC
Nitrite: NEGATIVE
RBC / HPF: NONE SEEN (ref 0–?)
Specific Gravity, Urine: >=1.03 (ref 1.000–1.030)
Total Protein, Urine: NEGATIVE
Urine Glucose: NEGATIVE
WBC, UA: NONE SEEN (ref 0–?)
pH: 6 (ref 5.0–8.0)

## 2012-09-24 LAB — BASIC METABOLIC PANEL
BUN: 18 mg/dL (ref 6–23)
CO2: 30 mEq/L (ref 19–32)
Chloride: 105 mEq/L (ref 96–112)
Creatinine, Ser: 0.8 mg/dL (ref 0.4–1.5)

## 2012-09-24 LAB — PSA: PSA: 1.23 ng/mL (ref 0.10–4.00)

## 2012-09-24 LAB — CBC WITH DIFFERENTIAL/PLATELET
Eosinophils Absolute: 0.2 10*3/uL (ref 0.0–0.7)
Eosinophils Relative: 2.3 % (ref 0.0–5.0)
MCHC: 34.2 g/dL (ref 30.0–36.0)
MCV: 92.7 fl (ref 78.0–100.0)
Monocytes Absolute: 0.6 10*3/uL (ref 0.1–1.0)
Neutrophils Relative %: 67.4 % (ref 43.0–77.0)
Platelets: 220 10*3/uL (ref 150.0–400.0)
WBC: 7.7 10*3/uL (ref 4.5–10.5)

## 2012-09-24 LAB — HEPATIC FUNCTION PANEL
ALT: 30 U/L (ref 0–53)
Total Protein: 6.4 g/dL (ref 6.0–8.3)

## 2012-09-24 LAB — LIPID PANEL
Cholesterol: 179 mg/dL (ref 0–200)
Triglycerides: 138 mg/dL (ref 0.0–149.0)

## 2012-10-01 ENCOUNTER — Ambulatory Visit (INDEPENDENT_AMBULATORY_CARE_PROVIDER_SITE_OTHER): Payer: BC Managed Care – PPO | Admitting: Internal Medicine

## 2012-10-01 ENCOUNTER — Encounter: Payer: Self-pay | Admitting: Internal Medicine

## 2012-10-01 VITALS — BP 150/90 | HR 80 | Temp 98.2°F | Resp 16 | Ht 73.0 in | Wt 202.0 lb

## 2012-10-01 DIAGNOSIS — E538 Deficiency of other specified B group vitamins: Secondary | ICD-10-CM

## 2012-10-01 DIAGNOSIS — IMO0001 Reserved for inherently not codable concepts without codable children: Secondary | ICD-10-CM

## 2012-10-01 DIAGNOSIS — I10 Essential (primary) hypertension: Secondary | ICD-10-CM

## 2012-10-01 DIAGNOSIS — R972 Elevated prostate specific antigen [PSA]: Secondary | ICD-10-CM

## 2012-10-01 DIAGNOSIS — E559 Vitamin D deficiency, unspecified: Secondary | ICD-10-CM

## 2012-10-01 DIAGNOSIS — Z0000 Encounter for general adult medical examination without abnormal findings: Secondary | ICD-10-CM

## 2012-10-01 DIAGNOSIS — F411 Generalized anxiety disorder: Secondary | ICD-10-CM

## 2012-10-01 DIAGNOSIS — E291 Testicular hypofunction: Secondary | ICD-10-CM

## 2012-10-01 MED ORDER — FENTANYL 25 MCG/HR TD PT72: 1.0000 | MEDICATED_PATCH | TRANSDERMAL | Status: DC

## 2012-10-01 NOTE — Assessment & Plan Note (Addendum)
We discussed age appropriate health related issues, including available/recomended screening tests and vaccinations. We discussed a need for adhering to healthy diet and exercise. Labs/EKG were reviewed/ordered. All questions were answered. He things he had Zostava

## 2012-10-01 NOTE — Assessment & Plan Note (Signed)
Continue with current prescription therapy as reflected on the Med list.  

## 2012-10-01 NOTE — Assessment & Plan Note (Signed)
He will see Dr Retta Diones next week

## 2012-10-01 NOTE — Assessment & Plan Note (Signed)
Doing ok.

## 2012-10-01 NOTE — Progress Notes (Signed)
   Subjective:    HPI The patient is here for a wellness exam.  He had B knee arthroscopy by Dr Thurston Hole in Sept 21st 2012 - now he was suggested TKR B.  The patient is here to follow up on chronic HTN, dyslipidemia, insomnia and chronic moderate spastic fibromyalgia/CFS symptoms controlled some with medicines. FMS is 80% better on Fentanyl/Tramadol C/o ED.  Knee pain B is bad  all the time - pain meds do not help with knee pain...  Review of Systems  Constitutional: Positive for fatigue. Negative for appetite change and unexpected weight change.  HENT: Negative for nosebleeds, congestion, sore throat, sneezing, trouble swallowing and neck pain.   Eyes: Negative for itching and visual disturbance.  Respiratory: Negative for cough.   Cardiovascular: Negative for chest pain, palpitations and leg swelling.  Gastrointestinal: Negative for nausea, diarrhea, blood in stool and abdominal distention.  Genitourinary: Negative for frequency and hematuria.  Musculoskeletal: Positive for myalgias, back pain and arthralgias. Negative for joint swelling and gait problem.  Skin: Negative for rash.  Neurological: Negative for dizziness, tremors, speech difficulty and weakness.  Psychiatric/Behavioral: Positive for sleep disturbance. Negative for suicidal ideas, dysphoric mood and agitation. The patient is not nervous/anxious.        Objective:   Physical Exam  Constitutional: He is oriented to person, place, and time. He appears well-developed. No distress.  obese  HENT:  Mouth/Throat: Oropharynx is clear and moist.  Eyes: Conjunctivae are normal. Pupils are equal, round, and reactive to light.  Neck: Normal range of motion. No JVD present. No thyromegaly present.  Cardiovascular: Normal rate, regular rhythm, normal heart sounds and intact distal pulses.  Exam reveals no gallop and no friction rub.   No murmur heard. Pulmonary/Chest: Effort normal and breath sounds normal. No respiratory  distress. He has no wheezes. He has no rales. He exhibits no tenderness.  Abdominal: Soft. Bowel sounds are normal. He exhibits no distension and no mass. There is no tenderness. There is no rebound and no guarding.  Genitourinary:  Refused - def to Urol  Musculoskeletal: He exhibits edema and tenderness.  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  Neurological: He is alert and oriented to person, place, and time. He has normal reflexes. No cranial nerve deficit. He exhibits normal muscle tone. Coordination normal.  Skin: Skin is warm and dry. No rash noted. No erythema.  Psychiatric: He has a normal mood and affect. His behavior is normal. Judgment and thought content normal.   Lab Results  Component Value Date   WBC 7.7 09/24/2012   HGB 14.7 09/24/2012   HCT 43.0 09/24/2012   PLT 220.0 09/24/2012   GLUCOSE 102* 09/24/2012   CHOL 179 09/24/2012   TRIG 138.0 09/24/2012   HDL 53.30 09/24/2012   LDLCALC 98 09/24/2012   ALT 30 09/24/2012   AST 22 09/24/2012   NA 138 09/24/2012   K 4.2 09/24/2012   CL 105 09/24/2012   CREATININE 0.8 09/24/2012   BUN 18 09/24/2012   CO2 30 09/24/2012   TSH 1.65 09/24/2012   PSA 1.23 09/24/2012   HGBA1C 5.6 01/15/2009          Assessment & Plan:

## 2012-12-26 ENCOUNTER — Encounter: Payer: Self-pay | Admitting: Internal Medicine

## 2012-12-26 ENCOUNTER — Ambulatory Visit (INDEPENDENT_AMBULATORY_CARE_PROVIDER_SITE_OTHER): Payer: BC Managed Care – PPO | Admitting: Internal Medicine

## 2012-12-26 VITALS — BP 146/92 | HR 72 | Temp 96.1°F | Resp 16 | Wt 200.0 lb

## 2012-12-26 DIAGNOSIS — I10 Essential (primary) hypertension: Secondary | ICD-10-CM

## 2012-12-26 DIAGNOSIS — Z23 Encounter for immunization: Secondary | ICD-10-CM

## 2012-12-26 DIAGNOSIS — F411 Generalized anxiety disorder: Secondary | ICD-10-CM

## 2012-12-26 DIAGNOSIS — M1710 Unilateral primary osteoarthritis, unspecified knee: Secondary | ICD-10-CM

## 2012-12-26 DIAGNOSIS — IMO0001 Reserved for inherently not codable concepts without codable children: Secondary | ICD-10-CM

## 2012-12-26 DIAGNOSIS — E538 Deficiency of other specified B group vitamins: Secondary | ICD-10-CM

## 2012-12-26 MED ORDER — OXYCODONE HCL 5 MG PO TABS: 5.0000 mg | ORAL_TABLET | Freq: Four times a day (QID) | ORAL | Status: DC | PRN

## 2012-12-26 MED ORDER — FENTANYL 25 MCG/HR TD PT72: 1.0000 | MEDICATED_PATCH | TRANSDERMAL | Status: DC

## 2012-12-26 NOTE — Assessment & Plan Note (Signed)
Continue with current prescription therapy as reflected on the Med list.  

## 2012-12-26 NOTE — Progress Notes (Signed)
   Subjective:    HPI     The patient is here to follow up on chronic HTN, dyslipidemia, insomnia and chronic moderate spastic fibromyalgia/CFS symptoms controlled some with medicines. FMS is 80% better on Fentanyl/Tramadol  f/u ED.  C/o cramps  Wt Readings from Last 3 Encounters:  12/26/12 200 lb (90.719 kg)  10/01/12 202 lb (91.627 kg)  07/04/12 210 lb (95.255 kg)   BP Readings from Last 3 Encounters:  12/26/12 146/92  10/01/12 150/90  07/04/12 136/96      Review of Systems  Constitutional: Positive for fatigue. Negative for appetite change and unexpected weight change.  HENT: Negative for nosebleeds, congestion, sore throat, sneezing, trouble swallowing and neck pain.   Eyes: Negative for itching and visual disturbance.  Respiratory: Negative for cough.   Cardiovascular: Negative for chest pain, palpitations and leg swelling.  Gastrointestinal: Negative for nausea, diarrhea, blood in stool and abdominal distention.  Genitourinary: Negative for frequency and hematuria.  Musculoskeletal: Positive for myalgias, back pain and arthralgias. Negative for joint swelling and gait problem.  Skin: Negative for rash.  Neurological: Negative for dizziness, tremors, speech difficulty and weakness.  Psychiatric/Behavioral: Positive for sleep disturbance. Negative for suicidal ideas, dysphoric mood and agitation. The patient is not nervous/anxious.        Objective:   Physical Exam  Constitutional: He is oriented to person, place, and time. He appears well-developed. No distress.  obese  HENT:  Mouth/Throat: Oropharynx is clear and moist.  Eyes: Conjunctivae are normal. Pupils are equal, round, and reactive to light.  Neck: Normal range of motion. No JVD present. No thyromegaly present.  Cardiovascular: Normal rate, regular rhythm, normal heart sounds and intact distal pulses.  Exam reveals no gallop and no friction rub.   No murmur heard. Pulmonary/Chest: Effort normal and  breath sounds normal. No respiratory distress. He has no wheezes. He has no rales. He exhibits no tenderness.  Abdominal: Soft. Bowel sounds are normal. He exhibits no distension and no mass. There is no tenderness. There is no rebound and no guarding.  Genitourinary:  Refused - def to Urol  Musculoskeletal: He exhibits edema and tenderness.  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  Neurological: He is alert and oriented to person, place, and time. He has normal reflexes. No cranial nerve deficit. He exhibits normal muscle tone. Coordination normal.  Skin: Skin is warm and dry. No rash noted. No erythema.  Psychiatric: He has a normal mood and affect. His behavior is normal. Judgment and thought content normal.   Lab Results  Component Value Date   WBC 7.7 09/24/2012   HGB 14.7 09/24/2012   HCT 43.0 09/24/2012   PLT 220.0 09/24/2012   GLUCOSE 102* 09/24/2012   CHOL 179 09/24/2012   TRIG 138.0 09/24/2012   HDL 53.30 09/24/2012   LDLCALC 98 09/24/2012   ALT 30 09/24/2012   AST 22 09/24/2012   NA 138 09/24/2012   K 4.2 09/24/2012   CL 105 09/24/2012   CREATININE 0.8 09/24/2012   BUN 18 09/24/2012   CO2 30 09/24/2012   TSH 1.65 09/24/2012   PSA 1.23 09/24/2012   HGBA1C 5.6 01/15/2009          Assessment & Plan:

## 2013-01-22 ENCOUNTER — Other Ambulatory Visit: Payer: Self-pay | Admitting: Internal Medicine

## 2013-03-28 ENCOUNTER — Ambulatory Visit: Payer: BC Managed Care – PPO | Admitting: Internal Medicine

## 2013-04-02 ENCOUNTER — Encounter: Payer: Self-pay | Admitting: Internal Medicine

## 2013-04-02 ENCOUNTER — Ambulatory Visit (INDEPENDENT_AMBULATORY_CARE_PROVIDER_SITE_OTHER): Payer: BC Managed Care – PPO | Admitting: Internal Medicine

## 2013-04-02 VITALS — BP 136/92 | HR 80 | Temp 97.8°F | Resp 16 | Wt 201.0 lb

## 2013-04-02 DIAGNOSIS — N400 Enlarged prostate without lower urinary tract symptoms: Secondary | ICD-10-CM

## 2013-04-02 DIAGNOSIS — E559 Vitamin D deficiency, unspecified: Secondary | ICD-10-CM

## 2013-04-02 DIAGNOSIS — I10 Essential (primary) hypertension: Secondary | ICD-10-CM

## 2013-04-02 DIAGNOSIS — M179 Osteoarthritis of knee, unspecified: Secondary | ICD-10-CM

## 2013-04-02 DIAGNOSIS — M1710 Unilateral primary osteoarthritis, unspecified knee: Secondary | ICD-10-CM

## 2013-04-02 DIAGNOSIS — E538 Deficiency of other specified B group vitamins: Secondary | ICD-10-CM

## 2013-04-02 DIAGNOSIS — IMO0002 Reserved for concepts with insufficient information to code with codable children: Secondary | ICD-10-CM

## 2013-04-02 DIAGNOSIS — IMO0001 Reserved for inherently not codable concepts without codable children: Secondary | ICD-10-CM

## 2013-04-02 MED ORDER — FENTANYL 25 MCG/HR TD PT72: 25.0000 ug | MEDICATED_PATCH | TRANSDERMAL | Status: DC

## 2013-04-02 MED ORDER — OXYCODONE HCL 5 MG PO TABS: 5.0000 mg | ORAL_TABLET | Freq: Four times a day (QID) | ORAL | Status: DC | PRN

## 2013-04-02 NOTE — Assessment & Plan Note (Signed)
Continue with current prescription therapy as reflected on the Med list.  

## 2013-04-02 NOTE — Assessment & Plan Note (Addendum)
L knee TKR is planned for 05/22/13 Dr Noemi Chapel Pre-op exam w/Anesthesia

## 2013-04-02 NOTE — Progress Notes (Signed)
Pre visit review using our clinic review tool, if applicable. No additional management support is needed unless otherwise documented below in the visit note. 

## 2013-04-02 NOTE — Progress Notes (Signed)
   Subjective:    HPI  C/o worsening knee pain. Getting  L TKR on 2/16 by Dr Noemi Chapel  The patient is here to follow up on chronic HTN, dyslipidemia, insomnia and chronic moderate spastic fibromyalgia/CFS symptoms controlled some with medicines. FMS is 80% better on Fentanyl/Tramadol  f/u ED.  F/u cramps  Wt Readings from Last 3 Encounters:  04/02/13 201 lb (91.173 kg)  12/26/12 200 lb (90.719 kg)  10/01/12 202 lb (91.627 kg)   BP Readings from Last 3 Encounters:  04/02/13 136/92  12/26/12 146/92  10/01/12 150/90      Review of Systems  Constitutional: Positive for fatigue. Negative for appetite change and unexpected weight change.  HENT: Negative for congestion, nosebleeds, sneezing, sore throat and trouble swallowing.   Eyes: Negative for itching and visual disturbance.  Respiratory: Negative for cough.   Cardiovascular: Negative for chest pain, palpitations and leg swelling.  Gastrointestinal: Negative for nausea, diarrhea, blood in stool and abdominal distention.  Genitourinary: Negative for frequency and hematuria.  Musculoskeletal: Positive for arthralgias, back pain and myalgias. Negative for gait problem, joint swelling and neck pain.  Skin: Negative for rash.  Neurological: Negative for dizziness, tremors, speech difficulty and weakness.  Psychiatric/Behavioral: Positive for sleep disturbance. Negative for suicidal ideas, dysphoric mood and agitation. The patient is not nervous/anxious.        Objective:   Physical Exam  Constitutional: He is oriented to person, place, and time. He appears well-developed. No distress.  obese  HENT:  Mouth/Throat: Oropharynx is clear and moist.  Eyes: Conjunctivae are normal. Pupils are equal, round, and reactive to light.  Neck: Normal range of motion. No JVD present. No thyromegaly present.  Cardiovascular: Normal rate, regular rhythm, normal heart sounds and intact distal pulses.  Exam reveals no gallop and no friction rub.    No murmur heard. Pulmonary/Chest: Effort normal and breath sounds normal. No respiratory distress. He has no wheezes. He has no rales. He exhibits no tenderness.  Abdominal: Soft. Bowel sounds are normal. He exhibits no distension and no mass. There is no tenderness. There is no rebound and no guarding.  Genitourinary:  Refused - def to Urol  Musculoskeletal: He exhibits edema and tenderness.  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  Neurological: He is alert and oriented to person, place, and time. He has normal reflexes. No cranial nerve deficit. He exhibits normal muscle tone. Coordination normal.  Skin: Skin is warm and dry. No rash noted. No erythema.  Psychiatric: He has a normal mood and affect. His behavior is normal. Judgment and thought content normal.  Limping  Lab Results  Component Value Date   WBC 7.7 09/24/2012   HGB 14.7 09/24/2012   HCT 43.0 09/24/2012   PLT 220.0 09/24/2012   GLUCOSE 102* 09/24/2012   CHOL 179 09/24/2012   TRIG 138.0 09/24/2012   HDL 53.30 09/24/2012   LDLCALC 98 09/24/2012   ALT 30 09/24/2012   AST 22 09/24/2012   NA 138 09/24/2012   K 4.2 09/24/2012   CL 105 09/24/2012   CREATININE 0.8 09/24/2012   BUN 18 09/24/2012   CO2 30 09/24/2012   TSH 1.65 09/24/2012   PSA 1.23 09/24/2012   HGBA1C 5.6 01/15/2009          Assessment & Plan:

## 2013-04-03 ENCOUNTER — Telehealth: Payer: Self-pay | Admitting: Internal Medicine

## 2013-04-03 NOTE — Telephone Encounter (Signed)
Relevant patient education assigned to patient using Emmi. ° °

## 2013-04-22 ENCOUNTER — Other Ambulatory Visit: Payer: Self-pay | Admitting: Internal Medicine

## 2013-04-28 ENCOUNTER — Other Ambulatory Visit (HOSPITAL_COMMUNITY): Payer: BC Managed Care – PPO

## 2013-04-30 ENCOUNTER — Encounter (HOSPITAL_COMMUNITY): Payer: Self-pay | Admitting: Pharmacy Technician

## 2013-04-30 ENCOUNTER — Encounter: Payer: Self-pay | Admitting: Physician Assistant

## 2013-04-30 ENCOUNTER — Other Ambulatory Visit: Payer: Self-pay | Admitting: Physician Assistant

## 2013-04-30 DIAGNOSIS — F419 Anxiety disorder, unspecified: Secondary | ICD-10-CM

## 2013-04-30 DIAGNOSIS — E559 Vitamin D deficiency, unspecified: Secondary | ICD-10-CM | POA: Insufficient documentation

## 2013-04-30 DIAGNOSIS — E538 Deficiency of other specified B group vitamins: Secondary | ICD-10-CM | POA: Insufficient documentation

## 2013-04-30 DIAGNOSIS — E785 Hyperlipidemia, unspecified: Secondary | ICD-10-CM

## 2013-04-30 DIAGNOSIS — I10 Essential (primary) hypertension: Secondary | ICD-10-CM | POA: Insufficient documentation

## 2013-04-30 DIAGNOSIS — N400 Enlarged prostate without lower urinary tract symptoms: Secondary | ICD-10-CM | POA: Insufficient documentation

## 2013-04-30 DIAGNOSIS — G4700 Insomnia, unspecified: Secondary | ICD-10-CM | POA: Insufficient documentation

## 2013-04-30 NOTE — H&P (Signed)
TOTAL KNEE ADMISSION H&P  Patient is being admitted for left total knee arthroplasty.  Subjective:  Chief Complaint:left knee pain.  HPI: Lee Owens, 63 y.o. male, has a history of pain and functional disability in the left knee due to arthritis and has failed non-surgical conservative treatments for greater than 12 weeks to includeNSAID's and/or analgesics, corticosteriod injections, viscosupplementation injections, flexibility and strengthening excercises, supervised PT with diminished ADL's post treatment, weight reduction as appropriate and activity modification.  Onset of symptoms was gradual, starting >10 years ago with gradually worsening course since that time. The patient noted prior procedures on the knee to include  arthroscopy and menisectomy on the left knee(s).  Patient currently rates pain in the left knee(s) at 10 out of 10 with activity. Patient has night pain, worsening of pain with activity and weight bearing, pain that interferes with activities of daily living, crepitus and joint swelling.  Patient has evidence of subchondral sclerosis, periarticular osteophytes and joint space narrowing by imaging studies. There is no active infection.  Patient Active Problem List   Diagnosis Date Noted  . Hyperlipidemia   . Hypertension   . Insomnia   . Anxiety   . Vitamin D deficiency disease   . Vitamin B12 deficiency   . BPH (benign prostatic hypertrophy)   . URI (upper respiratory infection) 07/04/2012  . Hyperglycemia 07/04/2012  . Constipation 04/03/2012  . Knee osteoarthritis 09/27/2011  . Well adult exam 09/27/2011  . Hypogonadism, male 11/30/2010  . Preop exam for internal medicine 11/30/2010  . 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 CERUMEN IMPACTION 04/28/2010  . BENIGN PROSTATIC HYPERTROPHY 07/22/2009  . CHEST PAIN, RIGHT 01/22/2009  . TOBACCO USE, QUIT 01/22/2009  . B12 DEFICIENCY 11/29/2007  . VITAMIN D DEFICIENCY 11/29/2007  . FIBROMYALGIA  11/29/2007  . HYPERLIPIDEMIA 08/29/2007  . ANXIETY 08/29/2007  . HYPERTENSION 08/29/2007  . ALLERGIC RHINITIS 08/29/2007  . HERNIA 08/29/2007  . ROSACEA 08/29/2007  . SLEEP DISORDER 08/29/2007  . FATIGUE 08/29/2007  . PSA, INCREASED 08/29/2007   Past Medical History  Diagnosis Date  . Hyperlipidemia   . Hypertension   . Muscle pain     and joint pan- Dr Justine Null  . Insomnia   . Chronic fatigue   . Allergic rhinitis   . Anxiety   . Vitamin D deficiency disease   . Vitamin B12 deficiency 2009  . BPH (benign prostatic hypertrophy)     Past Surgical History  Procedure Laterality Date  . Prostate biopsy  11-11     (Not in a hospital admission) No Known Allergies   Current Outpatient Prescriptions on File Prior to Visit  Medication Sig Dispense Refill  . aspirin 81 MG EC tablet Take 81 mg by mouth daily.        . baclofen (LIORESAL) 20 MG tablet Take 20 mg by mouth 3 (three) times daily.      . Cholecalciferol (VITAMIN D-3) 5000 UNITS TABS Take 5,000 Units by mouth daily.      . diazepam (VALIUM) 5 MG tablet Take 5 mg by mouth 3 (three) times daily as needed for anxiety or muscle spasms.       . DULoxetine (CYMBALTA) 60 MG capsule Take 120 mg by mouth daily.       . fentaNYL (DURAGESIC - DOSED MCG/HR) 25 MCG/HR patch Place 1 patch (25 mcg total) onto the skin every 3 (three) days. Please fill on or after 06/01/13  10 patch  0  . finasteride (PROSCAR) 5  MG tablet Take 5 mg by mouth daily.      Marland Kitchen lisinopril (PRINIVIL,ZESTRIL) 5 MG tablet Take 5 mg by mouth daily.      Marland Kitchen lovastatin (MEVACOR) 40 MG tablet Take 40 mg by mouth at bedtime.      Marland Kitchen omega-3 acid ethyl esters (LOVAZA) 1 G capsule Take 2 g by mouth daily.      Marland Kitchen oxyCODONE (OXY IR/ROXICODONE) 5 MG immediate release tablet Take 5 mg by mouth every 6 (six) hours as needed for severe pain.      . Probiotic Product (ALIGN PO) Take 1 capsule by mouth daily.        . silodosin (RAPAFLO) 8 MG CAPS capsule Take 8 mg by mouth daily with  breakfast.      . temazepam (RESTORIL) 30 MG capsule Take 30 mg by mouth at bedtime as needed for sleep.       . vitamin B-12 (CYANOCOBALAMIN) 1000 MCG tablet Take 2,000 mcg by mouth daily.        No current facility-administered medications on file prior to visit.   History  Substance Use Topics  . Smoking status: Former Smoker    Quit date: 10/23/2005  . Smokeless tobacco: Not on file  . Alcohol Use: Yes     Comment: 3 beers a day    Family History  Problem Relation Age of Onset  . Hypertension Other   . Coronary artery disease Other      Review of Systems  Constitutional: Negative.   HENT: Negative.   Eyes: Negative.   Respiratory: Negative.   Cardiovascular: Negative.   Gastrointestinal: Negative.   Genitourinary: Negative.   Musculoskeletal: Positive for back pain, joint pain and myalgias.  Skin: Negative.   Neurological: Negative.   Endo/Heme/Allergies: Negative.   Psychiatric/Behavioral: Negative.     Objective:  Physical Exam  Constitutional: He is oriented to person, place, and time. He appears well-developed and well-nourished.  HENT:  Head: Normocephalic and atraumatic.  Mouth/Throat: Oropharynx is clear and moist.  Eyes: Conjunctivae and EOM are normal. Pupils are equal, round, and reactive to light.  Neck: Neck supple.  Cardiovascular: Normal rate and regular rhythm.  Exam reveals no gallop and no friction rub.   No murmur heard. Respiratory: Effort normal and breath sounds normal. No respiratory distress. He has no wheezes. He has no rales. He exhibits no tenderness.  GI: Soft. Bowel sounds are normal. He exhibits no distension and no mass. There is no tenderness. There is no rebound and no guarding.  Musculoskeletal:  Examination of both knees reveals pain medially and laterally 1+ crepitation 1+ synovitis, range of motion is from -5 to 125 degrees mild varus deformity both knees are stable to ligamentous exam with normal patella tracking. Vascular  exam: pulses 2+ and symmetric.  Neurological: He is alert and oriented to person, place, and time.  Skin: Skin is warm and dry.  Psychiatric: He has a normal mood and affect. His behavior is normal.    Vital signs in last 24 hours:  Last recorded: 02/04 1300   BP: 144/88 Pulse: 70  Temp: 98.7 F (37.1 C)    Height: 6\' 1"  (1.854 m) SpO2: 97  Weight: 91.627 kg (202 lb)    Labs:   Estimated body mass index is 26.66 kg/(m^2) as calculated from the following:   Height as of this encounter: 6\' 1"  (1.854 m).   Weight as of this encounter: 91.627 kg (202 lb).   Imaging Review Plain radiographs  demonstrate severe degenerative joint disease of the left knee(s). The overall alignment issignificant varus. The bone quality appears to be good for age and reported activity level.  Assessment/Plan:  End stage arthritis, left knee   The patient history, physical examination, clinical judgment of the provider and imaging studies are consistent with end stage degenerative joint disease of the left knee(s) and total knee arthroplasty is deemed medically necessary. The treatment options including medical management, injection therapy arthroscopy and arthroplasty were discussed at length. The risks and benefits of total knee arthroplasty were presented and reviewed. The risks due to aseptic loosening, infection, stiffness, patella tracking problems, thromboembolic complications and other imponderables were discussed. The patient acknowledged the explanation, agreed to proceed with the plan and consent was signed. Patient is being admitted for inpatient treatment for surgery, pain control, PT, OT, prophylactic antibiotics, VTE prophylaxis, progressive ambulation and ADL's and discharge planning. The patient is planning to be discharged home with home health services  Shirleyann Montero A. Kaleen Mask Physician Assistant [facility] [phone]  04/30/2013, 2:21 PM

## 2013-05-02 ENCOUNTER — Other Ambulatory Visit (HOSPITAL_COMMUNITY): Payer: BC Managed Care – PPO

## 2013-05-02 NOTE — Pre-Procedure Instructions (Signed)
Lee Owens  05/02/2013   Your procedure is scheduled on:  Mon, Feb 16 @ 7:15 AM  Report to [facility] Stay Entrance A  at 5:30 AM.  Call this number if you have problems the morning of surgery: [phone]   Remember:   Do not eat food or drink liquids after midnight.   Take these medicines the morning of surgery with A SIP OF WATER: Valium(Diazepam),Cymbalta(Duloxetine),Proscar(Finasteride),Pain Pill(if needed),and Rapaflo(Silodosin)               Stop taking your Fish Oil and Aspirin. No Goody's,BC's,Aleve,Ibuprofen,or any Herbal Medications   Do not wear jewelry  Do not wear lotions, powders, or colognes. You may wear deodorant.  Men may shave face and neck.  Do not bring valuables to the hospital.  [facility] is not responsible                  for any belongings or valuables.               Contacts, dentures or bridgework may not be worn into surgery.  Leave suitcase in the car. After surgery it may be brought to your room.  For patients admitted to the hospital, discharge time is determined by your                treatment team.               Special Instructions:  [place] - Preparing for Surgery  Before surgery, you can play an important role.  Because skin is not sterile, your skin needs to be as free of germs as possible.  You can reduce the number of germs on you skin by washing with CHG (chlorahexidine gluconate) soap before surgery.  CHG is an antiseptic cleaner which kills germs and bonds with the skin to continue killing germs even after washing.  Please DO NOT use if you have an allergy to CHG or antibacterial soaps.  If your skin becomes reddened/irritated stop using the CHG and inform your nurse when you arrive at Short Stay.  Do not shave (including legs and underarms) for at least 48 hours prior to the first CHG shower.  You may shave your face.  Please follow these instructions carefully:   1.  Shower with CHG Soap the night before surgery and the                                 morning of Surgery.  2.  If you choose to wash your hair, wash your hair first as usual with your       normal shampoo.  3.  After you shampoo, rinse your hair and body thoroughly to remove the                      Shampoo.  4.  Use CHG as you would any other liquid soap.  You can apply chg directly       to the skin and wash gently with scrungie or a clean washcloth.  5.  Apply the CHG Soap to your body ONLY FROM THE NECK DOWN.        Do not use on open wounds or open sores.  Avoid contact with your eyes,       ears, mouth and genitals (private parts).  Wash genitals (private parts)       with your normal soap.  6.  Wash thoroughly, paying special attention to the area where your surgery        will be performed.  7.  Thoroughly rinse your body with warm water from the neck down.  8.  DO NOT shower/wash with your normal soap after using and rinsing off       the CHG Soap.  9.  Pat yourself dry with a clean towel.            10.  Wear clean pajamas.            11.  Place clean sheets on your bed the night of your first shower and do not        sleep with pets.  Day of Surgery  Do not apply any lotions/deoderants the morning of surgery.  Please wear clean clothes to the hospital/surgery center.     Please read over the following fact sheets that you were given: Pain Booklet, Coughing and Deep Breathing, Blood Transfusion Information, MRSA Information and Surgical Site Infection Prevention

## 2013-05-05 ENCOUNTER — Encounter (HOSPITAL_COMMUNITY)
Admission: RE | Admit: 2013-05-05 | Discharge: 2013-05-05 | Disposition: A | Discharge status: Home / Self Care | Payer: BC Managed Care – PPO | Source: Ambulatory Visit | Attending: Orthopedic Surgery | Admitting: Orthopedic Surgery

## 2013-05-05 ENCOUNTER — Encounter (HOSPITAL_COMMUNITY)
Admission: RE | Admit: 2013-05-05 | Discharge: 2013-05-05 | Disposition: A | Discharge status: Home / Self Care | Payer: BC Managed Care – PPO | Source: Ambulatory Visit | Attending: Physician Assistant | Admitting: Physician Assistant

## 2013-05-05 ENCOUNTER — Encounter (HOSPITAL_COMMUNITY): Payer: Self-pay

## 2013-05-05 DIAGNOSIS — Z01810 Encounter for preprocedural cardiovascular examination: Secondary | ICD-10-CM | POA: Insufficient documentation

## 2013-05-05 DIAGNOSIS — Z01818 Encounter for other preprocedural examination: Secondary | ICD-10-CM | POA: Insufficient documentation

## 2013-05-05 DIAGNOSIS — Z01812 Encounter for preprocedural laboratory examination: Secondary | ICD-10-CM | POA: Insufficient documentation

## 2013-05-05 HISTORY — DX: Unspecified hemorrhoids: K64.9

## 2013-05-05 HISTORY — DX: Depression, unspecified: F32.A

## 2013-05-05 HISTORY — DX: Effusion, unspecified joint: M25.40

## 2013-05-05 HISTORY — DX: Diverticulosis of intestine, part unspecified, without perforation or abscess without bleeding: K57.90

## 2013-05-05 HISTORY — DX: Frequency of micturition: R35.0

## 2013-05-05 HISTORY — DX: Other muscle spasm: M62.838

## 2013-05-05 HISTORY — DX: Major depressive disorder, single episode, unspecified: F32.9

## 2013-05-05 HISTORY — DX: Pneumonia, unspecified organism: J18.9

## 2013-05-05 HISTORY — DX: Pain in unspecified joint: M25.50

## 2013-05-05 HISTORY — DX: Dorsalgia, unspecified: M54.9

## 2013-05-05 HISTORY — DX: Unspecified osteoarthritis, unspecified site: M19.90

## 2013-05-05 LAB — URINE MICROSCOPIC-ADD ON

## 2013-05-05 LAB — URINALYSIS, ROUTINE W REFLEX MICROSCOPIC
Bilirubin Urine: NEGATIVE
Glucose, UA: NEGATIVE mg/dL
HGB URINE DIPSTICK: NEGATIVE
Ketones, ur: NEGATIVE mg/dL
Nitrite: NEGATIVE
PROTEIN: NEGATIVE mg/dL
Specific Gravity, Urine: 1.014 (ref 1.005–1.030)
Urobilinogen, UA: 0.2 mg/dL (ref 0.0–1.0)
pH: 6 (ref 5.0–8.0)

## 2013-05-05 LAB — SURGICAL PCR SCREEN
MRSA, PCR: NEGATIVE
STAPHYLOCOCCUS AUREUS: NEGATIVE

## 2013-05-05 LAB — CBC WITH DIFFERENTIAL/PLATELET
BASOS ABS: 0 10*3/uL (ref 0.0–0.1)
Basophils Relative: 0 % (ref 0–1)
EOS ABS: 0.1 10*3/uL (ref 0.0–0.7)
Eosinophils Relative: 2 % (ref 0–5)
HCT: 43.4 % (ref 39.0–52.0)
Hemoglobin: 14.7 g/dL (ref 13.0–17.0)
LYMPHS PCT: 25 % (ref 12–46)
Lymphs Abs: 1.7 10*3/uL (ref 0.7–4.0)
MCH: 30.5 pg (ref 26.0–34.0)
MCHC: 33.9 g/dL (ref 30.0–36.0)
MCV: 90 fL (ref 78.0–100.0)
Monocytes Absolute: 0.5 10*3/uL (ref 0.1–1.0)
Monocytes Relative: 8 % (ref 3–12)
NEUTROS PCT: 66 % (ref 43–77)
Neutro Abs: 4.4 10*3/uL (ref 1.7–7.7)
PLATELETS: 243 10*3/uL (ref 150–400)
RBC: 4.82 MIL/uL (ref 4.22–5.81)
RDW: 13 % (ref 11.5–15.5)
WBC: 6.7 10*3/uL (ref 4.0–10.5)

## 2013-05-05 LAB — COMPREHENSIVE METABOLIC PANEL
ALT: 26 U/L (ref 0–53)
AST: 19 U/L (ref 0–37)
Albumin: 3.9 g/dL (ref 3.5–5.2)
Alkaline Phosphatase: 73 U/L (ref 39–117)
BUN: 16 mg/dL (ref 6–23)
CO2: 27 mEq/L (ref 19–32)
Calcium: 9.5 mg/dL (ref 8.4–10.5)
Chloride: 100 mEq/L (ref 96–112)
Creatinine, Ser: 0.81 mg/dL (ref 0.50–1.35)
GFR calc Af Amer: >90 mL/min (ref >90)
GFR calc non Af Amer: >90 mL/min (ref >90)
Glucose, Bld: 114 mg/dL — ABNORMAL HIGH (ref 70–99)
POTASSIUM: 4.8 meq/L (ref 3.7–5.3)
SODIUM: 140 meq/L (ref 137–147)
TOTAL PROTEIN: 7.2 g/dL (ref 6.0–8.3)
Total Bilirubin: 0.5 mg/dL (ref 0.3–1.2)

## 2013-05-05 LAB — TYPE AND SCREEN
ABO/RH(D): B POS
ANTIBODY SCREEN: NEGATIVE

## 2013-05-05 LAB — APTT: aPTT: 33 seconds (ref 24–37)

## 2013-05-05 LAB — PROTIME-INR
INR: 0.87 (ref 0.00–1.49)
Prothrombin Time: 11.7 seconds (ref 11.6–15.2)

## 2013-05-05 LAB — ABO/RH: ABO/RH(D): B POS

## 2013-05-05 MED ORDER — CHLORHEXIDINE GLUCONATE 4 % EX LIQD: 60.0000 mL | Freq: Once | CUTANEOUS | Status: DC

## 2013-05-05 MED ORDER — POVIDONE-IODINE 7.5 % EX SOLN: Freq: Once | CUTANEOUS | Status: DC

## 2013-05-05 NOTE — Progress Notes (Signed)
Pt doesn't have a cardiologist  Denies ever having an echo/stress test/heart cath  Denies EKG or CXR in past yr  Medical Md is Dr.POlio

## 2013-05-05 NOTE — Progress Notes (Signed)
Pt doesn't have a cardiologist  Denies ever having an echo/stress test/heart cath  Denies EKG or CXR in past yr  Medical Md is Dr.Plotnikov

## 2013-05-06 LAB — URINE CULTURE: Colony Count: 8000

## 2013-05-07 ENCOUNTER — Telehealth: Payer: Self-pay | Admitting: Internal Medicine

## 2013-05-07 ENCOUNTER — Ambulatory Visit (INDEPENDENT_AMBULATORY_CARE_PROVIDER_SITE_OTHER): Payer: BC Managed Care – PPO | Admitting: Internal Medicine

## 2013-05-07 ENCOUNTER — Encounter: Payer: Self-pay | Admitting: Internal Medicine

## 2013-05-07 VITALS — BP 110/64 | HR 86 | Temp 99.0°F | Wt 201.0 lb

## 2013-05-07 DIAGNOSIS — F419 Anxiety disorder, unspecified: Secondary | ICD-10-CM

## 2013-05-07 DIAGNOSIS — I10 Essential (primary) hypertension: Secondary | ICD-10-CM

## 2013-05-07 DIAGNOSIS — F411 Generalized anxiety disorder: Secondary | ICD-10-CM

## 2013-05-07 DIAGNOSIS — J069 Acute upper respiratory infection, unspecified: Secondary | ICD-10-CM

## 2013-05-07 MED ORDER — OSELTAMIVIR PHOSPHATE 75 MG PO CAPS: 75.0000 mg | ORAL_CAPSULE | Freq: Two times a day (BID) | ORAL | Status: DC

## 2013-05-07 NOTE — Patient Instructions (Signed)
Please take all new medication as prescribed Please continue all other medications as before, and refills have been done if requested.  Please have the pharmacy call with any other refills you may need.  Lee Owens with your surgury.

## 2013-05-07 NOTE — Telephone Encounter (Signed)
Patient Information:  Caller Name: [name]  Phone: [phone]  Patient: Lee Owens, Lee Owens  Gender: Male  DOB: 26-Oct-1950  Age: 63 Years  PCP: Plotnikov, Alex (Adults only)  Office Follow Up:  Does the office need to follow up with this patient?: No  Instructions For The Office: N/A   Symptoms  Reason For Call & Symptoms: Scheduled for surgery on 05/12/13. He started with cough and congestion this morning-05/07/13. Felt weak and had a headache all day. Temp= 99.6 oral at 16:00. He has sore throat. [place] and PA recommended he be checked for flu.   Reviewed Health History In EMR: Yes  Reviewed Medications In EMR: Yes  Reviewed Allergies In EMR: Yes  Reviewed Surgeries / Procedures: Yes  Date of Onset of Symptoms: 05/07/2013  Guideline(s) Used:  Influenza - Seasonal  Disposition Per Guideline:   See Today or Tomorrow in Office  Reason For Disposition Reached:   Patient wants to be seen  Advice Given:  Expected Course  : The fever lasts 2-3 days, the runny nose 5-10 days, and the cough 2-3 weeks.  Call Back If:  Fever lasts more than 3 days  Runny nose lasts more than 10 days  Cough lasts more than 3 weeks  You become short of breath or worse.  Patient Will Follow Care Advice:  YES  Appointment Scheduled:  05/07/2013 18:15:00 Appointment Scheduled Provider:  Cathlean Cower (Adults only)

## 2013-05-07 NOTE — Progress Notes (Signed)
Pre-visit discussion using our clinic review tool. No additional management support is needed unless otherwise documented below in the visit note.  

## 2013-05-07 NOTE — Progress Notes (Signed)
Subjective:    Patient ID: Lee Owens, male    DOB: March 27, 1951, 63 y.o.   MRN: [id number]  HPI here with recent onset flu like symptoms with feverish, myalgias, general weakness and malaise, HA, fatigue, scratchy throat, cough, mild sob but o/w Pt denies chest pain,  wheezing, orthopnea, PND, increased LE swelling, palpitations, dizziness or syncope.  No diarrhea and Denies worsening reflux, abd pain, dysphagia, n/v, bowel change or blood. Denies worsening depressive symptoms, suicidal ideation, or panic; has ongoing anxiety, not increased recently. Due for surgury soon, want to be improved in about 5 days Past Medical History  Diagnosis Date  . Hyperlipidemia     takes Lovastatin daily  . Muscle pain     takes Baclofen daily  . Insomnia     takes Restoril nightly   . Chronic fatigue   . Allergic rhinitis   . Vitamin D deficiency disease   . Vitamin B12 deficiency 2009  . BPH (benign prostatic hypertrophy)     takes Proscar daily  . Hypertension     takes Lisinopril daily  . Night muscle spasms     takes Valium as needed  . Urinary frequency     takes rapaflo daily  . Pneumonia     at age 32  . Arthritis   . Joint pain   . Joint swelling   . Back pain     occasionally but reason unknown  . Hemorrhoids   . Diverticulosis   . Anxiety     takes Valium bid  . Depression     takes Cymbalta daily   Past Surgical History  Procedure Laterality Date  . Prostate biopsy  11-11  . Knee arthroscopy Bilateral 2012  . Colonoscopy      reports that he has quit smoking. He does not have any smokeless tobacco history on file. He reports that he drinks alcohol. He reports that he does not use illicit drugs. family history includes Coronary artery disease in his other; Hypertension in his other. No Known Allergies Current Outpatient Prescriptions on File Prior to Visit  Medication Sig Dispense Refill  . aspirin 81 MG EC tablet Take 81 mg by mouth daily.        . baclofen  (LIORESAL) 20 MG tablet Take 20 mg by mouth 3 (three) times daily.      . Cholecalciferol (VITAMIN D-3) 5000 UNITS TABS Take 5,000 Units by mouth daily.      . diazepam (VALIUM) 5 MG tablet Take 5 mg by mouth 3 (three) times daily as needed for anxiety or muscle spasms.       . DULoxetine (CYMBALTA) 60 MG capsule Take 120 mg by mouth daily.       . fentaNYL (DURAGESIC - DOSED MCG/HR) 25 MCG/HR patch Place 1 patch (25 mcg total) onto the skin every 3 (three) days. Please fill on or after 06/01/13  10 patch  0  . finasteride (PROSCAR) 5 MG tablet Take 5 mg by mouth daily.      Marland Kitchen lisinopril (PRINIVIL,ZESTRIL) 5 MG tablet Take 5 mg by mouth daily.      Marland Kitchen lovastatin (MEVACOR) 40 MG tablet Take 40 mg by mouth at bedtime.      Marland Kitchen omega-3 acid ethyl esters (LOVAZA) 1 G capsule Take 2 g by mouth daily.      Marland Kitchen oxyCODONE (OXY IR/ROXICODONE) 5 MG immediate release tablet Take 5 mg by mouth every 6 (six) hours as needed for severe pain.      Marland Kitchen  Probiotic Product (ALIGN PO) Take 1 capsule by mouth daily.        . silodosin (RAPAFLO) 8 MG CAPS capsule Take 8 mg by mouth daily with breakfast.      . temazepam (RESTORIL) 30 MG capsule Take 30 mg by mouth at bedtime as needed for sleep.       . vitamin B-12 (CYANOCOBALAMIN) 1000 MCG tablet Take 2,000 mcg by mouth daily.        No current facility-administered medications on file prior to visit.   Review of Systems All otherwise neg per pt     Objective:   Physical Exam BP 110/64  Pulse 86  Temp(Src) 99 F (37.2 C) (Oral)  Wt 201 lb (91.173 kg)  SpO2 97% VS noted, mild ill Constitutional: Pt appears well-developed and well-nourished.  HENT: Head: NCAT.  Right Ear: External ear normal.  Left Ear: External ear normal. Bilat tm's with mild erythema.  Max sinus areas non tender.  Pharynx with mild erythema, no exudate  Neck with bilat submandib LA, mild tender, mobile Eyes: Conjunctivae and EOM are normal. Pupils are equal, round, and reactive to light.    Neck: Normal range of motion. Neck supple.  Cardiovascular: Normal rate and regular rhythm.   Pulmonary/Chest: Effort normal and breath sounds normal.  Abd: soft, NT, + BS Neurological: Pt is alert. Not confused  Skin: Skin is warm. No erythema.  Psychiatric: Pt behavior is normal. Thought content normal. mild nervous    Assessment & Plan:

## 2013-05-09 NOTE — Assessment & Plan Note (Signed)
With flu like illness, ok for tamiflu asd,  to f/u any worsening symptoms or concerns

## 2013-05-09 NOTE — Assessment & Plan Note (Signed)
stable overall by history and exam, and pt to continue medical treatment as before,  to f/u any worsening symptoms or concerns 

## 2013-05-09 NOTE — Assessment & Plan Note (Signed)
stable overall by history and exam, recent data reviewed with pt, and pt to continue medical treatment as before,  to f/u any worsening symptoms or concerns BP Readings from Last 3 Encounters:  05/07/13 110/64  05/05/13 109/79  04/30/13 144/88

## 2013-05-11 MED ORDER — CEFAZOLIN SODIUM-DEXTROSE 2-3 GM-% IV SOLR
2.0000 g | INTRAVENOUS | Status: AC
  Administered 2013-05-12: 2 g via INTRAVENOUS

## 2013-05-11 MED ORDER — LACTATED RINGERS IV SOLN: INTRAVENOUS | Status: DC

## 2013-05-12 ENCOUNTER — Inpatient Hospital Stay (HOSPITAL_COMMUNITY)
Admission: RE | Admit: 2013-05-12 | Discharge: 2013-05-13 | DRG: 470 | Disposition: A | Discharge status: Home Health | Payer: BC Managed Care – PPO | Source: Ambulatory Visit | Attending: Orthopedic Surgery | Admitting: Orthopedic Surgery

## 2013-05-12 ENCOUNTER — Inpatient Hospital Stay (HOSPITAL_COMMUNITY): Payer: BC Managed Care – PPO

## 2013-05-12 ENCOUNTER — Encounter (HOSPITAL_COMMUNITY): Payer: Self-pay | Admitting: *Deleted

## 2013-05-12 ENCOUNTER — Inpatient Hospital Stay (HOSPITAL_COMMUNITY): Payer: BC Managed Care – PPO | Admitting: Anesthesiology

## 2013-05-12 ENCOUNTER — Encounter (HOSPITAL_COMMUNITY)
Admission: RE | Disposition: A | Discharge status: Home Health | Payer: Self-pay | Source: Ambulatory Visit | Attending: Orthopedic Surgery

## 2013-05-12 ENCOUNTER — Encounter (HOSPITAL_COMMUNITY): Payer: BC Managed Care – PPO | Admitting: Anesthesiology

## 2013-05-12 DIAGNOSIS — M179 Osteoarthritis of knee, unspecified: Secondary | ICD-10-CM | POA: Diagnosis present

## 2013-05-12 DIAGNOSIS — F329 Major depressive disorder, single episode, unspecified: Secondary | ICD-10-CM | POA: Diagnosis present

## 2013-05-12 DIAGNOSIS — N400 Enlarged prostate without lower urinary tract symptoms: Secondary | ICD-10-CM | POA: Diagnosis present

## 2013-05-12 DIAGNOSIS — M1712 Unilateral primary osteoarthritis, left knee: Secondary | ICD-10-CM | POA: Diagnosis present

## 2013-05-12 DIAGNOSIS — Z79899 Other long term (current) drug therapy: Secondary | ICD-10-CM

## 2013-05-12 DIAGNOSIS — F3289 Other specified depressive episodes: Secondary | ICD-10-CM | POA: Diagnosis present

## 2013-05-12 DIAGNOSIS — G479 Sleep disorder, unspecified: Secondary | ICD-10-CM | POA: Diagnosis present

## 2013-05-12 DIAGNOSIS — IMO0001 Reserved for inherently not codable concepts without codable children: Secondary | ICD-10-CM | POA: Diagnosis present

## 2013-05-12 DIAGNOSIS — E538 Deficiency of other specified B group vitamins: Secondary | ICD-10-CM | POA: Diagnosis present

## 2013-05-12 DIAGNOSIS — M1710 Unilateral primary osteoarthritis, unspecified knee: Secondary | ICD-10-CM | POA: Diagnosis present

## 2013-05-12 DIAGNOSIS — Z87891 Personal history of nicotine dependence: Secondary | ICD-10-CM

## 2013-05-12 DIAGNOSIS — E785 Hyperlipidemia, unspecified: Secondary | ICD-10-CM | POA: Diagnosis present

## 2013-05-12 DIAGNOSIS — R972 Elevated prostate specific antigen [PSA]: Secondary | ICD-10-CM | POA: Diagnosis present

## 2013-05-12 DIAGNOSIS — F419 Anxiety disorder, unspecified: Secondary | ICD-10-CM | POA: Diagnosis present

## 2013-05-12 DIAGNOSIS — F411 Generalized anxiety disorder: Secondary | ICD-10-CM | POA: Diagnosis present

## 2013-05-12 DIAGNOSIS — K5730 Diverticulosis of large intestine without perforation or abscess without bleeding: Secondary | ICD-10-CM | POA: Diagnosis present

## 2013-05-12 DIAGNOSIS — M791 Myalgia, unspecified site: Secondary | ICD-10-CM | POA: Diagnosis present

## 2013-05-12 DIAGNOSIS — E559 Vitamin D deficiency, unspecified: Secondary | ICD-10-CM | POA: Diagnosis present

## 2013-05-12 DIAGNOSIS — Z8249 Family history of ischemic heart disease and other diseases of the circulatory system: Secondary | ICD-10-CM

## 2013-05-12 DIAGNOSIS — Z7982 Long term (current) use of aspirin: Secondary | ICD-10-CM

## 2013-05-12 DIAGNOSIS — J309 Allergic rhinitis, unspecified: Secondary | ICD-10-CM | POA: Diagnosis present

## 2013-05-12 DIAGNOSIS — G4700 Insomnia, unspecified: Secondary | ICD-10-CM | POA: Diagnosis present

## 2013-05-12 DIAGNOSIS — I10 Essential (primary) hypertension: Secondary | ICD-10-CM | POA: Diagnosis present

## 2013-05-12 HISTORY — PX: TOTAL KNEE ARTHROPLASTY: SHX125

## 2013-05-12 LAB — CBC WITH DIFFERENTIAL/PLATELET
Basophils Absolute: 0 10*3/uL (ref 0.0–0.1)
Basophils Relative: 0 % (ref 0–1)
EOS PCT: 2 % (ref 0–5)
Eosinophils Absolute: 0.1 10*3/uL (ref 0.0–0.7)
HCT: 40.1 % (ref 39.0–52.0)
Hemoglobin: 14.4 g/dL (ref 13.0–17.0)
Lymphocytes Relative: 30 % (ref 12–46)
Lymphs Abs: 1.4 10*3/uL (ref 0.7–4.0)
MCH: 31.8 pg (ref 26.0–34.0)
MCHC: 35.9 g/dL (ref 30.0–36.0)
MCV: 88.5 fL (ref 78.0–100.0)
Monocytes Absolute: 0.4 10*3/uL (ref 0.1–1.0)
Monocytes Relative: 9 % (ref 3–12)
NEUTROS ABS: 2.7 10*3/uL (ref 1.7–7.7)
NEUTROS PCT: 58 % (ref 43–77)
PLATELETS: 233 10*3/uL (ref 150–400)
RBC: 4.53 MIL/uL (ref 4.22–5.81)
RDW: 12.8 % (ref 11.5–15.5)
WBC: 4.7 10*3/uL (ref 4.0–10.5)

## 2013-05-12 SURGERY — ARTHROPLASTY, KNEE, TOTAL
Anesthesia: Regional | Site: Knee | Laterality: Left

## 2013-05-12 MED ORDER — HYDROMORPHONE HCL PF 1 MG/ML IJ SOLN
INTRAMUSCULAR | Status: AC
  Administered 2013-05-12: 0.5 mg via INTRAVENOUS
  Filled 2013-05-12: qty 1

## 2013-05-12 MED ORDER — DEXAMETHASONE 6 MG PO TABS
10.0000 mg | ORAL_TABLET | Freq: Three times a day (TID) | ORAL | Status: AC
  Administered 2013-05-12 (×2): 10 mg via ORAL
  Filled 2013-05-12 (×3): qty 1

## 2013-05-12 MED ORDER — ALIGN 4 MG PO CAPS: 4.0000 mg | ORAL_CAPSULE | Freq: Every day | ORAL | Status: DC

## 2013-05-12 MED ORDER — DIAZEPAM 5 MG PO TABS
5.0000 mg | ORAL_TABLET | Freq: Three times a day (TID) | ORAL | Status: DC | PRN
  Administered 2013-05-12: 5 mg via ORAL
  Filled 2013-05-12: qty 1

## 2013-05-12 MED ORDER — DOCUSATE SODIUM 100 MG PO CAPS
100.0000 mg | ORAL_CAPSULE | Freq: Two times a day (BID) | ORAL | Status: DC
  Administered 2013-05-12 – 2013-05-13 (×2): 100 mg via ORAL
  Filled 2013-05-12 (×3): qty 1

## 2013-05-12 MED ORDER — BUPIVACAINE-EPINEPHRINE (PF) 0.25% -1:200000 IJ SOLN
INTRAMUSCULAR | Status: AC
  Filled 2013-05-12: qty 30

## 2013-05-12 MED ORDER — LACTATED RINGERS IV SOLN
INTRAVENOUS | Status: DC | PRN
  Administered 2013-05-12 (×2): via INTRAVENOUS

## 2013-05-12 MED ORDER — METOCLOPRAMIDE HCL 5 MG/ML IJ SOLN: 5.0000 mg | Freq: Three times a day (TID) | INTRAMUSCULAR | Status: DC | PRN

## 2013-05-12 MED ORDER — FENTANYL CITRATE 0.05 MG/ML IJ SOLN
INTRAMUSCULAR | Status: AC
  Filled 2013-05-12: qty 5

## 2013-05-12 MED ORDER — ARTIFICIAL TEARS OP OINT
TOPICAL_OINTMENT | OPHTHALMIC | Status: DC | PRN
  Administered 2013-05-12: 1 via OPHTHALMIC

## 2013-05-12 MED ORDER — DEXAMETHASONE SODIUM PHOSPHATE 10 MG/ML IJ SOLN
INTRAMUSCULAR | Status: DC | PRN
  Administered 2013-05-12: 6 mg

## 2013-05-12 MED ORDER — VITAMIN D3 25 MCG (1000 UNIT) PO TABS
5000.0000 [IU] | ORAL_TABLET | Freq: Every day | ORAL | Status: DC
  Administered 2013-05-13: 5000 [IU] via ORAL
  Filled 2013-05-12: qty 5

## 2013-05-12 MED ORDER — POTASSIUM CHLORIDE IN NACL 20-0.9 MEQ/L-% IV SOLN
INTRAVENOUS | Status: DC
  Administered 2013-05-12: 14:00:00 via INTRAVENOUS
  Filled 2013-05-12 (×4): qty 1000

## 2013-05-12 MED ORDER — SODIUM CHLORIDE 0.9 % IR SOLN
Status: DC | PRN
  Administered 2013-05-12: 3000 mL

## 2013-05-12 MED ORDER — ROCURONIUM BROMIDE 50 MG/5ML IV SOLN
INTRAVENOUS | Status: AC
  Filled 2013-05-12: qty 1

## 2013-05-12 MED ORDER — LORAZEPAM 2 MG/ML IJ SOLN
1.0000 mg | Freq: Once | INTRAMUSCULAR | Status: AC
  Administered 2013-05-12: 1 mg via INTRAVENOUS

## 2013-05-12 MED ORDER — METOCLOPRAMIDE HCL 10 MG PO TABS: 5.0000 mg | ORAL_TABLET | Freq: Three times a day (TID) | ORAL | Status: DC | PRN

## 2013-05-12 MED ORDER — GLYCOPYRROLATE 0.2 MG/ML IJ SOLN
INTRAMUSCULAR | Status: AC
  Filled 2013-05-12: qty 2

## 2013-05-12 MED ORDER — PROMETHAZINE HCL 25 MG/ML IJ SOLN: 6.2500 mg | INTRAMUSCULAR | Status: DC | PRN

## 2013-05-12 MED ORDER — CEFAZOLIN SODIUM-DEXTROSE 2-3 GM-% IV SOLR
2.0000 g | Freq: Four times a day (QID) | INTRAVENOUS | Status: AC
  Administered 2013-05-12 (×2): 2 g via INTRAVENOUS
  Filled 2013-05-12 (×2): qty 50

## 2013-05-12 MED ORDER — PROPOFOL 10 MG/ML IV BOLUS
INTRAVENOUS | Status: AC
  Filled 2013-05-12: qty 20

## 2013-05-12 MED ORDER — FENTANYL CITRATE 0.05 MG/ML IJ SOLN
INTRAMUSCULAR | Status: DC | PRN
  Administered 2013-05-12: 100 ug via INTRAVENOUS
  Administered 2013-05-12 (×6): 50 ug via INTRAVENOUS
  Administered 2013-05-12: 100 ug via INTRAVENOUS

## 2013-05-12 MED ORDER — BISACODYL 5 MG PO TBEC: 10.0000 mg | DELAYED_RELEASE_TABLET | Freq: Every day | ORAL | Status: DC

## 2013-05-12 MED ORDER — ONDANSETRON HCL 4 MG/2ML IJ SOLN
INTRAMUSCULAR | Status: DC | PRN
  Administered 2013-05-12: 4 mg via INTRAVENOUS

## 2013-05-12 MED ORDER — HYDROMORPHONE HCL PF 1 MG/ML IJ SOLN
0.2500 mg | INTRAMUSCULAR | Status: DC | PRN
  Administered 2013-05-12 (×4): 0.5 mg via INTRAVENOUS

## 2013-05-12 MED ORDER — VITAMIN D-3 125 MCG (5000 UT) PO TABS: 5000.0000 [IU] | ORAL_TABLET | Freq: Every day | ORAL | Status: DC

## 2013-05-12 MED ORDER — FINASTERIDE 5 MG PO TABS
5.0000 mg | ORAL_TABLET | Freq: Every day | ORAL | Status: DC
  Administered 2013-05-12 – 2013-05-13 (×2): 5 mg via ORAL
  Filled 2013-05-12 (×2): qty 1

## 2013-05-12 MED ORDER — ACETAMINOPHEN 325 MG PO TABS: 650.0000 mg | ORAL_TABLET | Freq: Four times a day (QID) | ORAL | Status: DC | PRN

## 2013-05-12 MED ORDER — MIDAZOLAM HCL 5 MG/5ML IJ SOLN
INTRAMUSCULAR | Status: DC | PRN
  Administered 2013-05-12: 2 mg via INTRAVENOUS

## 2013-05-12 MED ORDER — ALUM & MAG HYDROXIDE-SIMETH 200-200-20 MG/5ML PO SUSP: 30.0000 mL | ORAL | Status: DC | PRN

## 2013-05-12 MED ORDER — RISAQUAD PO CAPS
1.0000 | ORAL_CAPSULE | Freq: Every day | ORAL | Status: DC
  Administered 2013-05-13: 1 via ORAL
  Filled 2013-05-12: qty 1

## 2013-05-12 MED ORDER — BUPIVACAINE-EPINEPHRINE 0.25% -1:200000 IJ SOLN
INTRAMUSCULAR | Status: DC | PRN
  Administered 2013-05-12: 30 mL

## 2013-05-12 MED ORDER — ASPIRIN EC 325 MG PO TBEC
325.0000 mg | DELAYED_RELEASE_TABLET | Freq: Every day | ORAL | Status: DC
  Administered 2013-05-13: 325 mg via ORAL
  Filled 2013-05-12 (×2): qty 1

## 2013-05-12 MED ORDER — OXYCODONE HCL 5 MG PO TABS
5.0000 mg | ORAL_TABLET | ORAL | Status: DC | PRN
  Administered 2013-05-12 – 2013-05-13 (×4): 10 mg via ORAL
  Filled 2013-05-12 (×5): qty 2

## 2013-05-12 MED ORDER — BUPIVACAINE-EPINEPHRINE PF 0.5-1:200000 % IJ SOLN
INTRAMUSCULAR | Status: DC | PRN
  Administered 2013-05-12: 150 mg via PERINEURAL

## 2013-05-12 MED ORDER — SIMVASTATIN 20 MG PO TABS
20.0000 mg | ORAL_TABLET | Freq: Every day | ORAL | Status: DC
  Administered 2013-05-12: 20 mg via ORAL
  Filled 2013-05-12 (×2): qty 1

## 2013-05-12 MED ORDER — MIDAZOLAM HCL 2 MG/2ML IJ SOLN
INTRAMUSCULAR | Status: AC
  Filled 2013-05-12: qty 2

## 2013-05-12 MED ORDER — ACETAMINOPHEN 650 MG RE SUPP: 650.0000 mg | Freq: Four times a day (QID) | RECTAL | Status: DC | PRN

## 2013-05-12 MED ORDER — PROPOFOL 10 MG/ML IV BOLUS
INTRAVENOUS | Status: DC | PRN
  Administered 2013-05-12: 300 mg via INTRAVENOUS

## 2013-05-12 MED ORDER — FENTANYL 50 MCG/HR TD PT72: 50.0000 ug | MEDICATED_PATCH | TRANSDERMAL | Status: DC

## 2013-05-12 MED ORDER — DULOXETINE HCL 60 MG PO CPEP
120.0000 mg | ORAL_CAPSULE | Freq: Every day | ORAL | Status: DC
  Administered 2013-05-13: 120 mg via ORAL
  Filled 2013-05-12 (×2): qty 2

## 2013-05-12 MED ORDER — CELECOXIB 200 MG PO CAPS
200.0000 mg | ORAL_CAPSULE | Freq: Two times a day (BID) | ORAL | Status: DC
  Administered 2013-05-12 – 2013-05-13 (×3): 200 mg via ORAL
  Filled 2013-05-12 (×4): qty 1

## 2013-05-12 MED ORDER — FENTANYL 50 MCG/HR TD PT72
50.0000 ug | MEDICATED_PATCH | TRANSDERMAL | Status: DC
  Administered 2013-05-12: 50 ug via TRANSDERMAL
  Filled 2013-05-12: qty 1

## 2013-05-12 MED ORDER — DIPHENHYDRAMINE HCL 12.5 MG/5ML PO ELIX: 12.5000 mg | ORAL_SOLUTION | ORAL | Status: DC | PRN

## 2013-05-12 MED ORDER — NEOSTIGMINE METHYLSULFATE 1 MG/ML IJ SOLN
INTRAMUSCULAR | Status: AC
  Filled 2013-05-12: qty 10

## 2013-05-12 MED ORDER — ONDANSETRON HCL 4 MG PO TABS: 4.0000 mg | ORAL_TABLET | Freq: Four times a day (QID) | ORAL | Status: DC | PRN

## 2013-05-12 MED ORDER — ARTIFICIAL TEARS OP OINT
TOPICAL_OINTMENT | OPHTHALMIC | Status: AC
  Filled 2013-05-12: qty 3.5

## 2013-05-12 MED ORDER — DEXAMETHASONE SODIUM PHOSPHATE 10 MG/ML IJ SOLN
10.0000 mg | Freq: Three times a day (TID) | INTRAMUSCULAR | Status: AC
  Administered 2013-05-13: 10 mg via INTRAVENOUS
  Filled 2013-05-12 (×3): qty 1

## 2013-05-12 MED ORDER — BACLOFEN 20 MG PO TABS
20.0000 mg | ORAL_TABLET | Freq: Three times a day (TID) | ORAL | Status: DC
  Administered 2013-05-12 – 2013-05-13 (×3): 20 mg via ORAL
  Filled 2013-05-12 (×5): qty 1

## 2013-05-12 MED ORDER — LORAZEPAM 1 MG PO TABS
1.0000 mg | ORAL_TABLET | Freq: Four times a day (QID) | ORAL | Status: DC | PRN
  Administered 2013-05-12 – 2013-05-13 (×2): 1 mg via ORAL
  Filled 2013-05-12 (×3): qty 1

## 2013-05-12 MED ORDER — PHENOL 1.4 % MT LIQD: 1.0000 | OROMUCOSAL | Status: DC | PRN

## 2013-05-12 MED ORDER — OXYCODONE HCL 5 MG/5ML PO SOLN: 5.0000 mg | Freq: Once | ORAL | Status: DC | PRN

## 2013-05-12 MED ORDER — MENTHOL 3 MG MT LOZG: 1.0000 | LOZENGE | OROMUCOSAL | Status: DC | PRN

## 2013-05-12 MED ORDER — LISINOPRIL 5 MG PO TABS
5.0000 mg | ORAL_TABLET | Freq: Every day | ORAL | Status: DC
  Administered 2013-05-12 – 2013-05-13 (×2): 5 mg via ORAL
  Filled 2013-05-12 (×2): qty 1

## 2013-05-12 MED ORDER — LORAZEPAM 2 MG/ML IJ SOLN
INTRAMUSCULAR | Status: AC
  Filled 2013-05-12: qty 1

## 2013-05-12 MED ORDER — VITAMIN B-12 1000 MCG PO TABS
2000.0000 ug | ORAL_TABLET | Freq: Every day | ORAL | Status: DC
  Administered 2013-05-13: 2000 ug via ORAL
  Filled 2013-05-12 (×2): qty 2

## 2013-05-12 MED ORDER — TAMSULOSIN HCL 0.4 MG PO CAPS
0.4000 mg | ORAL_CAPSULE | Freq: Every day | ORAL | Status: DC
  Administered 2013-05-12: 0.4 mg via ORAL
  Filled 2013-05-12 (×2): qty 1

## 2013-05-12 MED ORDER — ONDANSETRON HCL 4 MG/2ML IJ SOLN
INTRAMUSCULAR | Status: AC
  Filled 2013-05-12: qty 2

## 2013-05-12 MED ORDER — OXYCODONE HCL 5 MG PO TABS: 5.0000 mg | ORAL_TABLET | Freq: Once | ORAL | Status: DC | PRN

## 2013-05-12 MED ORDER — ONDANSETRON HCL 4 MG/2ML IJ SOLN: 4.0000 mg | Freq: Four times a day (QID) | INTRAMUSCULAR | Status: DC | PRN

## 2013-05-12 MED ORDER — HYDROMORPHONE HCL PF 1 MG/ML IJ SOLN
1.0000 mg | INTRAMUSCULAR | Status: DC | PRN
  Administered 2013-05-12: 1 mg via INTRAVENOUS
  Administered 2013-05-12 – 2013-05-13 (×4): 2 mg via INTRAVENOUS
  Filled 2013-05-12: qty 2
  Filled 2013-05-12: qty 1
  Filled 2013-05-12 (×4): qty 2

## 2013-05-12 MED ORDER — TEMAZEPAM 15 MG PO CAPS: 30.0000 mg | ORAL_CAPSULE | Freq: Every evening | ORAL | Status: DC | PRN

## 2013-05-12 SURGICAL SUPPLY — 69 items
BANDAGE ESMARK 6X9 LF (GAUZE/BANDAGES/DRESSINGS) ×1 IMPLANT
BLADE SAGITTAL 25.0X1.19X90 (BLADE) ×2 IMPLANT
BLADE SAGITTAL 25.0X1.19X90MM (BLADE) ×1
BLADE SAW SGTL 11.0X1.19X90.0M (BLADE) IMPLANT
BLADE SAW SGTL 13.0X1.19X90.0M (BLADE) ×3 IMPLANT
BLADE SURG 10 STRL SS (BLADE) ×6 IMPLANT
BNDG ELASTIC 6X10 VLCR STRL LF (GAUZE/BANDAGES/DRESSINGS) ×3 IMPLANT
BNDG ELASTIC 6X15 VLCR STRL LF (GAUZE/BANDAGES/DRESSINGS) ×3 IMPLANT
BNDG ESMARK 6X9 LF (GAUZE/BANDAGES/DRESSINGS) ×3
BOWL SMART MIX CTS (DISPOSABLE) ×3 IMPLANT
CAPT RP KNEE ×3 IMPLANT
CEMENT HV SMART SET (Cement) ×6 IMPLANT
CLOSURE STERI-STRIP 1/2X4 (GAUZE/BANDAGES/DRESSINGS) ×1
CLOSURE WOUND 1/2 X4 (GAUZE/BANDAGES/DRESSINGS) ×1
CLOTH BEACON ORANGE TIMEOUT ST (SAFETY) ×3 IMPLANT
CLSR STERI-STRIP ANTIMIC 1/2X4 (GAUZE/BANDAGES/DRESSINGS) ×2 IMPLANT
COVER SURGICAL LIGHT HANDLE (MISCELLANEOUS) ×3 IMPLANT
CUFF TOURNIQUET SINGLE 34IN LL (TOURNIQUET CUFF) ×3 IMPLANT
CUFF TOURNIQUET SINGLE 44IN (TOURNIQUET CUFF) IMPLANT
DRAPE EXTREMITY T 121X128X90 (DRAPE) ×3 IMPLANT
DRAPE INCISE IOBAN 66X45 STRL (DRAPES) ×3 IMPLANT
DRAPE PROXIMA HALF (DRAPES) ×3 IMPLANT
DRAPE U-SHAPE 47X51 STRL (DRAPES) ×3 IMPLANT
DRSG ADAPTIC 3X8 NADH LF (GAUZE/BANDAGES/DRESSINGS) ×3 IMPLANT
DRSG PAD ABDOMINAL 8X10 ST (GAUZE/BANDAGES/DRESSINGS) ×6 IMPLANT
DURAPREP 26ML APPLICATOR (WOUND CARE) ×6 IMPLANT
ELECT CAUTERY BLADE 6.4 (BLADE) ×3 IMPLANT
ELECT REM PT RETURN 9FT ADLT (ELECTROSURGICAL) ×3
ELECTRODE REM PT RTRN 9FT ADLT (ELECTROSURGICAL) ×1 IMPLANT
EVACUATOR 1/8 PVC DRAIN (DRAIN) ×3 IMPLANT
FACESHIELD LNG OPTICON STERILE (SAFETY) ×3 IMPLANT
GLOVE BIO SURGEON STRL SZ7 (GLOVE) ×3 IMPLANT
GLOVE BIOGEL PI IND STRL 7.0 (GLOVE) ×1 IMPLANT
GLOVE BIOGEL PI IND STRL 7.5 (GLOVE) ×1 IMPLANT
GLOVE BIOGEL PI INDICATOR 7.0 (GLOVE) ×2
GLOVE BIOGEL PI INDICATOR 7.5 (GLOVE) ×2
GLOVE SS BIOGEL STRL SZ 7.5 (GLOVE) ×1 IMPLANT
GLOVE SUPERSENSE BIOGEL SZ 7.5 (GLOVE) ×2
GOWN PREVENTION PLUS XLARGE (GOWN DISPOSABLE) ×6 IMPLANT
GOWN STRL NON-REIN LRG LVL3 (GOWN DISPOSABLE) ×6 IMPLANT
HANDPIECE INTERPULSE COAX TIP (DISPOSABLE) ×2
HOOD PEEL AWAY FACE SHEILD DIS (HOOD) ×6 IMPLANT
IMMOBILIZER KNEE 22 UNIV (SOFTGOODS) ×3 IMPLANT
KIT BASIN OR (CUSTOM PROCEDURE TRAY) ×3 IMPLANT
KIT ROOM TURNOVER OR (KITS) ×3 IMPLANT
MANIFOLD NEPTUNE II (INSTRUMENTS) ×3 IMPLANT
NS IRRIG 1000ML POUR BTL (IV SOLUTION) ×3 IMPLANT
PACK TOTAL JOINT (CUSTOM PROCEDURE TRAY) ×3 IMPLANT
PAD ABD 8X10 STRL (GAUZE/BANDAGES/DRESSINGS) ×3 IMPLANT
PAD ARMBOARD 7.5X6 YLW CONV (MISCELLANEOUS) ×6 IMPLANT
PAD CAST 4YDX4 CTTN HI CHSV (CAST SUPPLIES) ×1 IMPLANT
PADDING CAST COTTON 4X4 STRL (CAST SUPPLIES) ×2
PADDING CAST COTTON 6X4 STRL (CAST SUPPLIES) ×3 IMPLANT
RUBBERBAND STERILE (MISCELLANEOUS) ×3 IMPLANT
SET HNDPC FAN SPRY TIP SCT (DISPOSABLE) ×1 IMPLANT
SPONGE GAUZE 4X4 12PLY (GAUZE/BANDAGES/DRESSINGS) ×3 IMPLANT
STRIP CLOSURE SKIN 1/2X4 (GAUZE/BANDAGES/DRESSINGS) ×2 IMPLANT
SUCTION FRAZIER TIP 10 FR DISP (SUCTIONS) ×3 IMPLANT
SUT ETHIBOND NAB CT1 #1 30IN (SUTURE) ×6 IMPLANT
SUT MNCRL AB 3-0 PS2 18 (SUTURE) ×3 IMPLANT
SUT VIC AB 0 CT1 27 (SUTURE) ×4
SUT VIC AB 0 CT1 27XBRD ANBCTR (SUTURE) ×2 IMPLANT
SUT VIC AB 2-0 CT1 27 (SUTURE) ×4
SUT VIC AB 2-0 CT1 TAPERPNT 27 (SUTURE) ×2 IMPLANT
SYR 30ML SLIP (SYRINGE) ×3 IMPLANT
TOWEL OR 17X24 6PK STRL BLUE (TOWEL DISPOSABLE) ×3 IMPLANT
TOWEL OR 17X26 10 PK STRL BLUE (TOWEL DISPOSABLE) ×3 IMPLANT
TRAY FOLEY CATH 16FR SILVER (SET/KITS/TRAYS/PACK) ×3 IMPLANT
WATER STERILE IRR 1000ML POUR (IV SOLUTION) ×6 IMPLANT

## 2013-05-12 NOTE — Anesthesia Preprocedure Evaluation (Addendum)
Anesthesia Evaluation  Patient identified by MRN, date of birth, ID band Patient awake    Reviewed: Allergy & Precautions, H&P , NPO status , Patient's Chart, lab work & pertinent test results, reviewed documented beta blocker date and time   Airway Mallampati: II TM Distance: >3 FB Neck ROM: Full    Dental  (+) Teeth Intact, Caps, Dental Advisory Given   Pulmonary former smoker,    Pulmonary exam normal       Cardiovascular hypertension, Pt. on medications     Neuro/Psych PSYCHIATRIC DISORDERS Anxiety Depression negative neurological ROS     GI/Hepatic negative GI ROS, Neg liver ROS,   Endo/Other  negative endocrine ROS  Renal/GU negative Renal ROS     Musculoskeletal   Abdominal Normal abdominal exam  (+)   Peds  Hematology   Anesthesia Other Findings   Reproductive/Obstetrics                       Anesthesia Physical Anesthesia Plan  ASA: II  Anesthesia Plan: General and Regional   Post-op Pain Management:    Induction: Intravenous  Airway Management Planned: Oral ETT and LMA  Additional Equipment:   Intra-op Plan:   Post-operative Plan: Extubation in OR  Informed Consent: I have reviewed the patients History and Physical, chart, labs and discussed the procedure including the risks, benefits and alternatives for the proposed anesthesia with the patient or authorized representative who has indicated his/her understanding and acceptance.   Dental advisory given  Plan Discussed with: CRNA, Anesthesiologist and Surgeon  Anesthesia Plan Comments:      Anesthesia Quick Evaluation

## 2013-05-12 NOTE — Preoperative (Signed)
Beta Blockers   Reason not to administer Beta Blockers:Not Applicable 

## 2013-05-12 NOTE — Anesthesia Postprocedure Evaluation (Signed)
Anesthesia Post Note  Patient: Lee Owens  Procedure(s) Performed: Procedure(s) (LRB): TOTAL KNEE ARTHROPLASTY- left (Left)  Anesthesia type: general  Patient location: PACU  Post pain: Pain level controlled  Post assessment: Patient's Cardiovascular Status Stable  Last Vitals:  Filed Vitals:   05/12/13 1045  BP:   Pulse: 89  Temp:   Resp: 14    Post vital signs: Reviewed and stable  Level of consciousness: sedated  Complications: No apparent anesthesia complications

## 2013-05-12 NOTE — Anesthesia Procedure Notes (Addendum)
Anesthesia Regional Block:  Adductor canal block  Pre-Anesthetic Checklist: ,, timeout performed, Correct Patient, Correct Site, Correct Laterality, Correct Procedure, Correct Position, site marked, Risks and benefits discussed,  Surgical consent,  Pre-op evaluation,  At surgeon's request and post-op pain management  Laterality: Left  Prep: chloraprep       Needles:  Injection technique: Single-shot  Needle Type: Echogenic Stimulator Needle     Needle Length: 5cm 5 cm Needle Gauge: 22 and 22 G    Additional Needles:  Procedures: ultrasound guided (picture in chart) Adductor canal block Narrative:  Start time: 05/12/2013 6:54 AM End time: 05/12/2013 7:04 AM Injection made incrementally with aspirations every 5 mL.  Performed by: Personally  Anesthesiologist: Earnest Bailey, MD  Additional Notes: Functioning IV was confirmed and monitors were applied.  A 93mm 22ga Arrow echogenic stimulator needle was used. Sterile prep and drape,hand hygiene and sterile gloves were used. Ultrasound guidance: relevant anatomy identified, needle position confirmed, local anesthetic spread visualized around nerve(s)., vascular puncture avoided.  Image printed for medical record. Negative aspiration and negative test dose prior to incremental administration of local anesthetic. The patient tolerated the procedure well.     Anesthesia Regional Block:   Narrative:

## 2013-05-12 NOTE — Op Note (Signed)
MRN:     [id number] DOB/AGE:    1950-07-28 / 63 y.o.       OPERATIVE REPORT    DATE OF PROCEDURE:  05/12/2013       PREOPERATIVE DIAGNOSIS:   OA LEFT KNEE      Estimated body mass index is 26.39 kg/(m^2) as calculated from the following:   Height as of 05/05/13: 6\' 1"  (1.854 m).   Weight as of 12/26/12: 90.719 kg (200 lb).                                                        POSTOPERATIVE DIAGNOSIS:   OA LEFT KNEE                                                                      PROCEDURE:  Procedure(s): TOTAL KNEE ARTHROPLASTY- left Using Depuy Sigma RP implants #4 Femur, #5Tibia, 33mm sigma RP bearing, 35 Patella     SURGEON: Yasmene Salomone Owens    ASSISTANT:  Kirstin Shepperson PA-C   (Present and scrubbed throughout the case, critical for assistance with exposure, retraction, instrumentation, and closure.)         ANESTHESIA: GET with Femoral Nerve Block  DRAINS: foley, 2 medium hemovac in knee   TOURNIQUET TIME: 87FIE   COMPLICATIONS:  None     SPECIMENS: None   INDICATIONS FOR PROCEDURE: The patient has  OA LEFT KNEE, varus deformities, XR shows bone on bone arthritis. Patient has failed all conservative measures including anti-inflammatory medicines, narcotics, attempts at  exercise and weight loss, cortisone injections and viscosupplementation.  Risks and benefits of surgery have been discussed, questions answered.   DESCRIPTION OF PROCEDURE: The patient identified by armband, received  right femoral nerve block and IV antibiotics, in the holding area at [facility]. Patient taken to the operating room, appropriate anesthetic  monitors were attached General endotracheal anesthesia induced with  the patient in supine position, Foley catheter was inserted. Tourniquet  applied high to the operative thigh. Lateral post and foot positioner  applied to the table, the lower extremity was then prepped and draped  in usual sterile fashion from the ankle to the tourniquet.  Time-out procedure was performed. The limb was wrapped with an Esmarch bandage and the tourniquet inflated to 365 mmHg. We began the operation by making the anterior midline incision starting at handbreadth above the patella going over the patella 1 cm medial to and  4 cm distal to the tibial tubercle. Small bleeders in the skin and the  subcutaneous tissue identified and cauterized. Transverse retinaculum was incised and reflected medially and Owens medial parapatellar arthrotomy was accomplished. the patella was everted and theprepatellar fat pad resected. The superficial medial collateral  ligament was then elevated from anterior to posterior along the proximal  flare of the tibia and anterior half of the menisci resected. The knee was hyperflexed exposing bone on bone arthritis. Peripheral and notch osteophytes as well as the cruciate ligaments were then resected. We continued to  work our way around posteriorly along the proximal tibia, and externally  rotated the tibia subluxing it out from underneath the femur. Owens McHale  retractor was placed through the notch and Owens lateral Hohmann retractor  placed, and we then drilled through the proximal tibia in line with the  axis of the tibia followed by an intramedullary guide rod and 2-degree  posterior slope cutting guide. The tibial cutting guide was pinned into place  allowing resection of 4 mm of bone medially and about 6 mm of bone  laterally because of her varus deformity. Satisfied with the tibial resection, we then  entered the distal femur 2 mm anterior to the PCL origin with the  intramedullary guide rod and applied the distal femoral cutting guide  set at 21mm, with 5 degrees of valgus. This was pinned along the  epicondylar axis. At this point, the distal femoral cut was accomplished without difficulty. We then sized for Owens #4 femoral component and pinned the guide in 3 degrees of external rotation.The chamfer cutting guide was pinned into place.  The anterior, posterior, and chamfer cuts were accomplished without difficulty followed by  the Sigma RP box cutting guide and the box cut. We also removed posterior osteophytes from the posterior femoral condyles. At this  time, the knee was brought into full extension. We checked our  extension and flexion gaps and found them symmetric at 34mm.  The patella thickness measured at 26 mm. We set the cutting guide at 17 and removed the posterior 9.5-10 mm  of the patella sized for 35 button and drilled the lollipop. The knee  was then once again hyperflexed exposing the proximal tibia. We sized for Owens #5 tibial base plate, applied the smokestack and the conical reamer followed by the the Delta fin keel punch. We then hammered into place the Sigma RP trial femoral component, inserted Owens 15-mm trial bearing, trial patellar button, and took the knee through range of motion from 0-130 degrees. No thumb pressure was required for patellar  tracking. At this point, all trial components were removed, Owens double batch of DePuy HV cement  was mixed and applied to all bony metallic mating surfaces except for the posterior condyles of the femur itself. In order, we  hammered into place the tibial tray and removed excess cement, the femoral component and removed excess cement, Owens 15-mm Sigma RP bearing  was inserted, and the knee brought to full extension with compression.  The patellar button was clamped into place, and excess cement  removed. While the cement cured the wound was irrigated out with normal saline solution pulse lavage, and medium Hemovac drains were placed.. Ligament stability and patellar tracking were checked and found to be excellent. The tourniquet was then released and hemostasis was obtained with cautery. The parapatellar arthrotomy was closed with  #1 ethibond suture. The subcutaneous tissue with 0 and 2-0 undyed  Vicryl suture, and 4-0 Monocryl.. Owens dressing of Xeroform,  4 x 4, dressing sponges,  Webril, and Ace wrap applied. Needle and sponge count were correct times 2.The patient awakened, extubated, and taken to recovery room without difficulty. Vascular status was normal, pulses 2+ and symmetric.   Lee Owens 05/12/2013, 8:48 AM

## 2013-05-12 NOTE — Progress Notes (Signed)
Orthopedic Tech Progress Note Patient Details:  Lee Owens September 20, 1950 [id number]  CPM Left Knee CPM Left Knee: On Left Knee Flexion (Degrees): 60 Left Knee Extension (Degrees): 0 Additional Comments: Trapeze bar and foot roll   Irish Elders 05/12/2013, 10:34 AM

## 2013-05-12 NOTE — Care Management Note (Signed)
CARE MANAGEMENT NOTE 05/12/2013  Patient:  LAVALLE, SKODA   Account Number:  [id number]  Date Initiated:  05/12/2013  Documentation initiated by:  Ricki Miller  Subjective/Objective Assessment:   63 yr old male s/p left total knee arthroplasty.     Action/Plan:   Case manager spoke with patient and wife concerning home health and DME needs at discharge. Preoperatively setup with [facility], no changes. Has RW, 3in1. CPM to be delivered.   Anticipated DC Date:  05/13/2013   Anticipated DC Plan:  [place]  CM consult      [facility] Choice  HOME HEALTH  DURABLE MEDICAL EQUIPMENT   Choice offered to / List presented to:  C-3 Spouse   DME arranged  CPM      DME agency  [employer]     [place] arranged  HH-2 PT      [place]   Status of service:  Completed, signed off Medicare Important Message given?   (If response is "NO", the following Medicare IM given date fields will be blank) Date Medicare IM given:   Date Additional Medicare IM given:    Discharge Disposition:  [place]

## 2013-05-12 NOTE — H&P (View-Only) (Signed)
TOTAL KNEE ADMISSION H&P  Patient is being admitted for left total knee arthroplasty.  Subjective:  Chief Complaint:left knee pain.  HPI: Lee Owens, 63 y.o. male, has a history of pain and functional disability in the left knee due to arthritis and has failed non-surgical conservative treatments for greater than 12 weeks to includeNSAID's and/or analgesics, corticosteriod injections, viscosupplementation injections, flexibility and strengthening excercises, supervised PT with diminished ADL's post treatment, weight reduction as appropriate and activity modification.  Onset of symptoms was gradual, starting >10 years ago with gradually worsening course since that time. The patient noted prior procedures on the knee to include  arthroscopy and menisectomy on the left knee(s).  Patient currently rates pain in the left knee(s) at 10 out of 10 with activity. Patient has night pain, worsening of pain with activity and weight bearing, pain that interferes with activities of daily living, crepitus and joint swelling.  Patient has evidence of subchondral sclerosis, periarticular osteophytes and joint space narrowing by imaging studies. There is no active infection.  Patient Active Problem List   Diagnosis Date Noted  . Hyperlipidemia   . Hypertension   . Insomnia   . Anxiety   . Vitamin D deficiency disease   . Vitamin B12 deficiency   . BPH (benign prostatic hypertrophy)   . URI (upper respiratory infection) 07/04/2012  . Hyperglycemia 07/04/2012  . Constipation 04/03/2012  . Knee osteoarthritis 09/27/2011  . Well adult exam 09/27/2011  . Hypogonadism, male 11/30/2010  . Preop exam for internal medicine 11/30/2010  . 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 CERUMEN IMPACTION 04/28/2010  . BENIGN PROSTATIC HYPERTROPHY 07/22/2009  . CHEST PAIN, RIGHT 01/22/2009  . TOBACCO USE, QUIT 01/22/2009  . B12 DEFICIENCY 11/29/2007  . VITAMIN D DEFICIENCY 11/29/2007  . FIBROMYALGIA  11/29/2007  . HYPERLIPIDEMIA 08/29/2007  . ANXIETY 08/29/2007  . HYPERTENSION 08/29/2007  . ALLERGIC RHINITIS 08/29/2007  . HERNIA 08/29/2007  . ROSACEA 08/29/2007  . SLEEP DISORDER 08/29/2007  . FATIGUE 08/29/2007  . PSA, INCREASED 08/29/2007   Past Medical History  Diagnosis Date  . Hyperlipidemia   . Hypertension   . Muscle pain     and joint pan- Dr Justine Null  . Insomnia   . Chronic fatigue   . Allergic rhinitis   . Anxiety   . Vitamin D deficiency disease   . Vitamin B12 deficiency 2009  . BPH (benign prostatic hypertrophy)     Past Surgical History  Procedure Laterality Date  . Prostate biopsy  11-11     (Not in a hospital admission) No Known Allergies   Current Outpatient Prescriptions on File Prior to Visit  Medication Sig Dispense Refill  . aspirin 81 MG EC tablet Take 81 mg by mouth daily.        . baclofen (LIORESAL) 20 MG tablet Take 20 mg by mouth 3 (three) times daily.      . Cholecalciferol (VITAMIN D-3) 5000 UNITS TABS Take 5,000 Units by mouth daily.      . diazepam (VALIUM) 5 MG tablet Take 5 mg by mouth 3 (three) times daily as needed for anxiety or muscle spasms.       . DULoxetine (CYMBALTA) 60 MG capsule Take 120 mg by mouth daily.       . fentaNYL (DURAGESIC - DOSED MCG/HR) 25 MCG/HR patch Place 1 patch (25 mcg total) onto the skin every 3 (three) days. Please fill on or after 06/01/13  10 patch  0  . finasteride (PROSCAR) 5  MG tablet Take 5 mg by mouth daily.      Marland Kitchen lisinopril (PRINIVIL,ZESTRIL) 5 MG tablet Take 5 mg by mouth daily.      Marland Kitchen lovastatin (MEVACOR) 40 MG tablet Take 40 mg by mouth at bedtime.      Marland Kitchen omega-3 acid ethyl esters (LOVAZA) 1 G capsule Take 2 g by mouth daily.      Marland Kitchen oxyCODONE (OXY IR/ROXICODONE) 5 MG immediate release tablet Take 5 mg by mouth every 6 (six) hours as needed for severe pain.      . Probiotic Product (ALIGN PO) Take 1 capsule by mouth daily.        . silodosin (RAPAFLO) 8 MG CAPS capsule Take 8 mg by mouth daily with  breakfast.      . temazepam (RESTORIL) 30 MG capsule Take 30 mg by mouth at bedtime as needed for sleep.       . vitamin B-12 (CYANOCOBALAMIN) 1000 MCG tablet Take 2,000 mcg by mouth daily.        No current facility-administered medications on file prior to visit.   History  Substance Use Topics  . Smoking status: Former Smoker    Quit date: 10/23/2005  . Smokeless tobacco: Not on file  . Alcohol Use: Yes     Comment: 3 beers a day    Family History  Problem Relation Age of Onset  . Hypertension Other   . Coronary artery disease Other      Review of Systems  Constitutional: Negative.   HENT: Negative.   Eyes: Negative.   Respiratory: Negative.   Cardiovascular: Negative.   Gastrointestinal: Negative.   Genitourinary: Negative.   Musculoskeletal: Positive for back pain, joint pain and myalgias.  Skin: Negative.   Neurological: Negative.   Endo/Heme/Allergies: Negative.   Psychiatric/Behavioral: Negative.     Objective:  Physical Exam  Constitutional: He is oriented to person, place, and time. He appears well-developed and well-nourished.  HENT:  Head: Normocephalic and atraumatic.  Mouth/Throat: Oropharynx is clear and moist.  Eyes: Conjunctivae and EOM are normal. Pupils are equal, round, and reactive to light.  Neck: Neck supple.  Cardiovascular: Normal rate and regular rhythm.  Exam reveals no gallop and no friction rub.   No murmur heard. Respiratory: Effort normal and breath sounds normal. No respiratory distress. He has no wheezes. He has no rales. He exhibits no tenderness.  GI: Soft. Bowel sounds are normal. He exhibits no distension and no mass. There is no tenderness. There is no rebound and no guarding.  Musculoskeletal:  Examination of both knees reveals pain medially and laterally 1+ crepitation 1+ synovitis, range of motion is from -5 to 125 degrees mild varus deformity both knees are stable to ligamentous exam with normal patella tracking. Vascular  exam: pulses 2+ and symmetric.  Neurological: He is alert and oriented to person, place, and time.  Skin: Skin is warm and dry.  Psychiatric: He has a normal mood and affect. His behavior is normal.    Vital signs in last 24 hours:  Last recorded: 02/04 1300   BP: 144/88 Pulse: 70  Temp: 98.7 F (37.1 C)    Height: 6\' 1"  (1.854 m) SpO2: 97  Weight: 91.627 kg (202 lb)    Labs:   Estimated body mass index is 26.66 kg/(m^2) as calculated from the following:   Height as of this encounter: 6\' 1"  (1.854 m).   Weight as of this encounter: 91.627 kg (202 lb).   Imaging Review Plain radiographs  demonstrate severe degenerative joint disease of the left knee(s). The overall alignment issignificant varus. The bone quality appears to be good for age and reported activity level.  Assessment/Plan:  End stage arthritis, left knee   The patient history, physical examination, clinical judgment of the provider and imaging studies are consistent with end stage degenerative joint disease of the left knee(s) and total knee arthroplasty is deemed medically necessary. The treatment options including medical management, injection therapy arthroscopy and arthroplasty were discussed at length. The risks and benefits of total knee arthroplasty were presented and reviewed. The risks due to aseptic loosening, infection, stiffness, patella tracking problems, thromboembolic complications and other imponderables were discussed. The patient acknowledged the explanation, agreed to proceed with the plan and consent was signed. Patient is being admitted for inpatient treatment for surgery, pain control, PT, OT, prophylactic antibiotics, VTE prophylaxis, progressive ambulation and ADL's and discharge planning. The patient is planning to be discharged home with home health services  Juanetta Negash A. Kaleen Mask Physician Assistant [facility] [phone]  04/30/2013, 2:21 PM

## 2013-05-12 NOTE — Evaluation (Signed)
Physical Therapy Evaluation Patient Details Name: Lee Owens MRN: [id number] DOB: 1950/10/20 Today's Date: 05/12/2013 Time: 3662-9476 PT Time Calculation (min): 26 min  PT Assessment / Plan / Recommendation History of Present Illness  Pt is a 63 y/o male admitted s/p L TKA.   Clinical Impression  This patient presents with acute pain and decreased functional independence following the above mentioned procedure. At the time of PT eval, pt was able to perform transfers and mobility with min assist. He was even able to progress to a step-through gait pattern during gait training. This patient is appropriate for skilled PT interventions to address functional limitations, improve safety and independence with functional mobility, and return to PLOF.    PT Assessment  Patient needs continued PT services    Follow Up Recommendations  Home health PT    Does the patient have the potential to tolerate intense rehabilitation      Barriers to Discharge        Equipment Recommendations  None recommended by PT    Recommendations for Other Services     Frequency 7X/week    Precautions / Restrictions Precautions Precautions: Fall;Knee Precaution Comments: Discussed towel roll under heel and NO pillow under knee.  Required Braces or Orthoses: Knee Immobilizer - Left Knee Immobilizer - Left: Discontinue once straight leg raise with < 10 degree lag (Pt able to demonstrate SLR during eval - KI deferred) Restrictions Weight Bearing Restrictions: Yes LLE Weight Bearing: Weight bearing as tolerated   Pertinent Vitals/Pain 5/10 after ambulation. RN notified.       Mobility  Bed Mobility Overal bed mobility: Needs Assistance Bed Mobility: Supine to Sit Supine to sit: Min assist;HOB elevated General bed mobility comments: VC's for sequencing and technqiue. Assist for movement of LLE as pt transitioned to EOB.  Transfers Overall transfer level: Needs assistance Equipment used: Rolling  walker (2 wheeled) Transfers: Sit to/from Stand Sit to Stand: Min assist General transfer comment: VC's for hand placement on seated surface for safety. Ambulation/Gait Ambulation/Gait assistance: Min guard Ambulation Distance (Feet): 100 Feet Assistive device: Rolling walker (2 wheeled) Gait Pattern/deviations: Step-to pattern;Step-through pattern;Decreased stride length Gait velocity: Decreased Gait velocity interpretation: Below normal speed for age/gender General Gait Details: Pt cued for sequencing and safety awareness with the RW. Specifically for increased heel strike and increased quad activation with weight bearing. Pt motivated to walk but was stopped at 100 feet as to not overdo it on the first time ambulating.    Exercises Total Joint Exercises Ankle Circles/Pumps: 10 reps Quad Sets: 10 reps   PT Diagnosis: Difficulty walking;Acute pain  PT Problem List: Decreased strength;Decreased range of motion;Decreased activity tolerance;Decreased balance;Decreased mobility;Decreased knowledge of use of DME;Decreased safety awareness;Decreased knowledge of precautions;Pain PT Treatment Interventions: DME instruction;Stair training;Gait training;Functional mobility training;Therapeutic activities;Therapeutic exercise;Neuromuscular re-education;Patient/family education     PT Goals(Current goals can be found in the care plan section) Acute Rehab PT Goals Patient Stated Goal: To return home with wife PT Goal Formulation: With patient/family Time For Goal Achievement: 05/19/13 Potential to Achieve Goals: Good  Visit Information  Last PT Received On: 05/12/13 Assistance Needed: +1 History of Present Illness: Pt is a 62 y/o male admitted s/p L TKA.        Prior [place] expects to be discharged to:: Private residence Living Arrangements: Spouse/significant other Available Help at Discharge: Family;Available 24 hours/day Type of Home: House Home  Access: Level entry Home Layout: One level Home Equipment: Walker - 2 wheels;Cane - single  point;Bedside commode;Tub bench Prior Function Level of Independence: Independent Comments: >5 falls in past year Communication Communication: No difficulties Dominant Hand: Right    Cognition  Cognition Arousal/Alertness: Awake/alert Behavior During Therapy: WFL for tasks assessed/performed Overall Cognitive Status: Within Functional Limits for tasks assessed    Extremity/Trunk Assessment Upper Extremity Assessment Upper Extremity Assessment: Overall WFL for tasks assessed Lower Extremity Assessment Lower Extremity Assessment: LLE deficits/detail LLE Deficits / Details: Decreased strength and AROM consistent with TKA LLE: Unable to fully assess due to pain Cervical / Trunk Assessment Cervical / Trunk Assessment: Normal   Balance Balance Overall balance assessment: No apparent balance deficits (not formally assessed) General Comments General comments (skin integrity, edema, etc.): Dressing was reinforced prior to PT session and at beginning of session therapist noted pt bleeding through reinforced dressing. RN notified.  End of Session PT - End of Session Equipment Utilized During Treatment: Gait belt Activity Tolerance: Patient tolerated treatment well Patient left: in chair;with call bell/phone within reach;with family/visitor present Nurse Communication: Mobility status CPM Left Knee CPM Left Knee: Off  GP     Jolyn Lent 05/12/2013, 3:53 PM  Jolyn Lent, [place], DPT [phone]

## 2013-05-12 NOTE — Interval H&P Note (Signed)
History and Physical Interval Note:  05/12/2013 7:06 AM  Lee Owens  has presented today for surgery, with the diagnosis of OA LEFT KNEE  The various methods of treatment have been discussed with the patient and family. After consideration of risks, benefits and other options for treatment, the patient has consented to  Procedure(s): TOTAL KNEE ARTHROPLASTY (Left) as a surgical intervention .  The patient's history has been reviewed, patient examined, no change in status, stable for surgery.  I have reviewed the patient's chart and labs.  Questions were answered to the patient's satisfaction.     [name]

## 2013-05-12 NOTE — Discharge Instructions (Signed)
Home Health physical therapy to be provided by [facility] [phone]

## 2013-05-12 NOTE — Transfer of Care (Signed)
Immediate Anesthesia Transfer of Care Note  Patient: Lee Owens  Procedure(s) Performed: Procedure(s): TOTAL KNEE ARTHROPLASTY- left (Left)  Patient Location: PACU  Anesthesia Type:GA combined with regional for post-op pain  Level of Consciousness: awake, alert  and oriented  Airway & Oxygen Therapy: Patient Spontanous Breathing and Patient connected to face mask oxygen  Post-op Assessment: Report given to PACU RN  Post vital signs: Reviewed and stable  Complications: No apparent anesthesia complications

## 2013-05-13 LAB — CBC
HCT: 33.7 % — ABNORMAL LOW (ref 39.0–52.0)
Hemoglobin: 11.8 g/dL — ABNORMAL LOW (ref 13.0–17.0)
MCH: 31.6 pg (ref 26.0–34.0)
MCHC: 35 g/dL (ref 30.0–36.0)
MCV: 90.3 fL (ref 78.0–100.0)
PLATELETS: 251 10*3/uL (ref 150–400)
RBC: 3.73 MIL/uL — AB (ref 4.22–5.81)
RDW: 12.9 % (ref 11.5–15.5)
WBC: 11.5 10*3/uL — AB (ref 4.0–10.5)

## 2013-05-13 LAB — BASIC METABOLIC PANEL
BUN: 10 mg/dL (ref 6–23)
CHLORIDE: 101 meq/L (ref 96–112)
CO2: 25 mEq/L (ref 19–32)
Calcium: 8.6 mg/dL (ref 8.4–10.5)
Creatinine, Ser: 0.77 mg/dL (ref 0.50–1.35)
GFR calc Af Amer: >90 mL/min (ref >90)
GFR calc non Af Amer: >90 mL/min (ref >90)
Glucose, Bld: 183 mg/dL — ABNORMAL HIGH (ref 70–99)
POTASSIUM: 4.6 meq/L (ref 3.7–5.3)
Sodium: 137 mEq/L (ref 137–147)

## 2013-05-13 MED ORDER — CELECOXIB 200 MG PO CAPS: 200.0000 mg | ORAL_CAPSULE | Freq: Every day | ORAL | Status: DC

## 2013-05-13 MED ORDER — ACETAMINOPHEN 325 MG PO TABS: 650.0000 mg | ORAL_TABLET | Freq: Four times a day (QID) | ORAL | Status: DC | PRN

## 2013-05-13 MED ORDER — BISACODYL 5 MG PO TBEC: DELAYED_RELEASE_TABLET | ORAL | Status: DC

## 2013-05-13 MED ORDER — SODIUM CHLORIDE 0.9 % IV BOLUS (SEPSIS): 500.0000 mL | Freq: Once | INTRAVENOUS | Status: DC

## 2013-05-13 MED ORDER — OXYCODONE HCL 5 MG PO TABS: ORAL_TABLET | ORAL | Status: DC

## 2013-05-13 MED ORDER — SODIUM CHLORIDE 0.9 % IV BOLUS (SEPSIS)
500.0000 mL | Freq: Once | INTRAVENOUS | Status: AC
  Administered 2013-05-13: 500 mL via INTRAVENOUS

## 2013-05-13 MED ORDER — LORAZEPAM 1 MG PO TABS: 1.0000 mg | ORAL_TABLET | Freq: Three times a day (TID) | ORAL | Status: DC | PRN

## 2013-05-13 MED ORDER — ASPIRIN 325 MG PO TBEC: DELAYED_RELEASE_TABLET | ORAL | Status: DC

## 2013-05-13 MED ORDER — FENTANYL 50 MCG/HR TD PT72: 50.0000 ug | MEDICATED_PATCH | TRANSDERMAL | Status: DC

## 2013-05-13 MED ORDER — DSS 100 MG PO CAPS: ORAL_CAPSULE | ORAL | Status: DC

## 2013-05-13 NOTE — Progress Notes (Signed)
Physical Therapy Treatment Patient Details Name: Lee Owens MRN: [id number] DOB: 1950/11/18 Today's Date: 05/13/2013 Time: 7673-4193 PT Time Calculation (min): 37 min  PT Assessment / Plan / Recommendation  History of Present Illness Pt is a 63 y/o male admitted s/p L TKA.    PT Comments   Pt progressing well towards physical therapy goals. Pt and wife anticipate d/c later this morning, and therapist discussed HEP, car transfer, and safety awareness for return home trip.   Follow Up Recommendations  Home health PT     Does the patient have the potential to tolerate intense rehabilitation     Barriers to Discharge        Equipment Recommendations  None recommended by PT    Recommendations for Other Services    Frequency 7X/week   Progress towards PT Goals Progress towards PT goals: Progressing toward goals  Plan Current plan remains appropriate    Precautions / Restrictions Precautions Precautions: Fall;Knee Precaution Comments: Discussed towel roll under heel and NO pillow under knee.  Restrictions Weight Bearing Restrictions: Yes LLE Weight Bearing: Weight bearing as tolerated   Pertinent Vitals/Pain Pt reports minimal pain throughout session.     Mobility  Bed Mobility General bed mobility comments: NT - pt sitting up in recliner when PT arrived.  Transfers Overall transfer level: Needs assistance Equipment used: Rolling walker (2 wheeled) Transfers: Sit to/from Stand Sit to Stand: Supervision General transfer comment: VC's for hand placement on seated surface for safety. Ambulation/Gait Ambulation/Gait assistance: Min guard;Supervision Ambulation Distance (Feet): 200 Feet Assistive device: Rolling walker (2 wheeled) Gait Pattern/deviations: Step-through pattern;Decreased stride length Gait velocity: Decreased Gait velocity interpretation: Below normal speed for age/gender General Gait Details: VC's for increased fluidity of walker movement and proper  placement of walker during turns.  Stairs: Yes Stairs assistance: Min guard Stair Management: Forwards;With walker;Two rails Number of Stairs: 6 (1 curb step and 5 steps ascending and descending with 2 rail) General stair comments: VC's for sequencing and safety awareness.    Exercises Total Joint Exercises Ankle Circles/Pumps: 10 reps Quad Sets: 10 reps Short Arc Quad: 10 reps Heel Slides: 10 reps Hip ABduction/ADduction: 10 reps Straight Leg Raises: 10 reps Long Arc Quad: 10 reps Goniometric ROM: 6-113 AAROM   PT Diagnosis:    PT Problem List:   PT Treatment Interventions:     PT Goals (current goals can now be found in the care plan section) Acute Rehab PT Goals Patient Stated Goal: To return home with wife PT Goal Formulation: With patient/family Time For Goal Achievement: 05/19/13 Potential to Achieve Goals: Good  Visit Information  Last PT Received On: 05/13/13 Assistance Needed: +1 History of Present Illness: Pt is a 63 y/o male admitted s/p L TKA.     Subjective Data  Subjective: Pt agreeable to PT session and OOB. Patient Stated Goal: To return home with wife   Cognition  Cognition Arousal/Alertness: Awake/alert Behavior During Therapy: WFL for tasks assessed/performed Overall Cognitive Status: Within Functional Limits for tasks assessed    Balance  Balance Overall balance assessment: No apparent balance deficits (not formally assessed)  End of Session PT - End of Session Equipment Utilized During Treatment: Gait belt Activity Tolerance: Patient tolerated treatment well Patient left: in chair;with call bell/phone within reach;with family/visitor present Nurse Communication: Mobility status CPM Left Knee CPM Left Knee: Off Additional Comments:  (Bone foam on)   GP     Jolyn Lent 05/13/2013, 12:18 PM  Jolyn Lent, PT, DPT [phone]

## 2013-05-13 NOTE — Progress Notes (Signed)
OT Cancellation Note  Patient Details Name: Lee Owens MRN: [id number] DOB: 02-15-1951   Cancelled Treatment:    Reason Eval/Treat Not Completed: OT screened, no needs identified, will sign off. 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  Almon Register [phone] 05/13/2013, 9:05 AM

## 2013-05-13 NOTE — Discharge Summary (Signed)
Patient ID: Lee Owens MRN: [id number] DOB/AGE: 03-27-1951 63 y.o.  Admit date: 05/12/2013 Discharge date: 05/13/2013  Admission Diagnoses:  Principal Problem:   Left knee DJD Active Problems:   VITAMIN D DEFICIENCY   ALLERGIC RHINITIS   FIBROMYALGIA   SLEEP DISORDER   PSA, INCREASED   Hypertension   Anxiety   BPH (benign prostatic hypertrophy)   DJD (degenerative joint disease) of knee   Discharge Diagnoses:  Same  Past Medical History  Diagnosis Date  . Hyperlipidemia     takes Lovastatin daily  . Muscle pain     takes Baclofen daily  . Insomnia     takes Restoril nightly   . Chronic fatigue   . Allergic rhinitis   . Vitamin D deficiency disease   . Vitamin B12 deficiency 2009  . BPH (benign prostatic hypertrophy)     takes Proscar daily  . Hypertension     takes Lisinopril daily  . Night muscle spasms     takes Valium as needed  . Urinary frequency     takes rapaflo daily  . Pneumonia     at age 63  . Arthritis   . Joint pain   . Joint swelling   . Back pain     occasionally but reason unknown  . Hemorrhoids   . Diverticulosis   . Anxiety     takes Valium bid  . Depression     takes Cymbalta daily    Surgeries: Procedure(s): TOTAL KNEE ARTHROPLASTY- left on 05/12/2013   Consultants:    Discharged Condition: Improved  Hospital Course: Lee Owens is a 63 y.o. male who was admitted 05/12/2013 for operative treatment ofLeft knee DJD. Patient has severe unremitting pain that affects sleep, daily activities, and work/hobbies. After pre-op clearance the patient was taken to the operating room on 05/12/2013 and underwent  Procedure(s): TOTAL KNEE ARTHROPLASTY- left.    Patient was given perioperative antibiotics: Anti-infectives   Start     Dose/Rate Route Frequency Ordered Stop   05/12/13 1330  ceFAZolin (ANCEF) IVPB 2 g/50 mL premix     2 g 100 mL/hr over 30 Minutes Intravenous Every 6 hours 05/12/13 1112 05/12/13 2059   05/12/13 0600   ceFAZolin (ANCEF) IVPB 2 g/50 mL premix     2 g 100 mL/hr over 30 Minutes Intravenous On call to O.R. 05/11/13 1445 05/12/13 5176       Patient was given sequential compression devices, early ambulation, and chemoprophylaxis to prevent DVT.  Patient benefited maximally from hospital stay and there were no complications.    Recent vital signs: Patient Vitals for the past 24 hrs:  BP Temp Temp src Pulse Resp SpO2 Height Weight  05/13/13 0550 104/60 mmHg 98.2 F (36.8 C) Oral 67 18 99 % - -  05/13/13 0133 102/52 mmHg 97.3 F (36.3 C) Oral 63 18 98 % - -  05/12/13 2022 103/60 mmHg 98.3 F (36.8 C) Oral 72 18 97 % - -  05/12/13 1700 101/56 mmHg 98.1 F (36.7 C) - 81 17 - 6\' 1"  (1.854 m) 91.627 kg (202 lb)  05/12/13 1122 141/88 mmHg 98.1 F (36.7 C) - 82 18 92 % - -  05/12/13 1115 144/91 mmHg 98.1 F (36.7 C) - 82 16 94 % - -  05/12/13 1045 140/63 mmHg 97.9 F (36.6 C) - 89 14 97 % - -  05/12/13 1030 137/87 mmHg - - 88 11 97 % - -  05/12/13 0917 - 98.6 F (37 C) - - - - - -  Recent laboratory studies:  Recent Labs  05/12/13 0547 05/13/13 0355  WBC 4.7 11.5*  HGB 14.4 11.8*  HCT 40.1 33.7*  PLT 233 251  NA  --  137  K  --  4.6  CL  --  101  CO2  --  25  BUN  --  10  CREATININE  --  0.77  GLUCOSE  --  183*  CALCIUM  --  8.6     Discharge Medications:     Medication List    STOP taking these medications       diazepam 5 MG tablet  Commonly known as:  VALIUM     fentaNYL 25 MCG/HR patch  Commonly known as:  DURAGESIC - dosed mcg/hr  Replaced by:  fentaNYL 50 MCG/HR     omega-3 acid ethyl esters 1 G capsule  Commonly known as:  LOVAZA     oseltamivir 75 MG capsule  Commonly known as:  TAMIFLU      TAKE these medications       acetaminophen 325 MG tablet  Commonly known as:  TYLENOL  Take 2 tablets (650 mg total) by mouth every 6 (six) hours as needed for mild pain (or Fever >/= 101).     ALIGN PO  Take 1 capsule by mouth daily.     aspirin 325  MG EC tablet  1 tab a day for the next 30 days to prevent blood clots     baclofen 20 MG tablet  Commonly known as:  LIORESAL  Take 20 mg by mouth 3 (three) times daily.     bisacodyl 5 MG EC tablet  Commonly known as:  DULCOLAX  Take 2 tablets every night with dinner until bowel movement.  LAXITIVE.  Restart if two days since last bowel movement     celecoxib 200 MG capsule  Commonly known as:  CELEBREX  Take 1 capsule (200 mg total) by mouth daily.     DSS 100 MG Caps  1 tab 2 times a day while on narcotics.  STOOL SOFTENER     DULoxetine 60 MG capsule  Commonly known as:  CYMBALTA  Take 120 mg by mouth daily.     fentaNYL 50 MCG/HR  Commonly known as:  DURAGESIC - dosed mcg/hr  Place 1 patch (50 mcg total) onto the skin every 3 (three) days.     finasteride 5 MG tablet  Commonly known as:  PROSCAR  Take 5 mg by mouth daily.     lisinopril 5 MG tablet  Commonly known as:  PRINIVIL,ZESTRIL  Take 5 mg by mouth daily.     LORazepam 1 MG tablet  Commonly known as:  ATIVAN  Take 1 tablet (1 mg total) by mouth every 8 (eight) hours as needed for anxiety.     lovastatin 40 MG tablet  Commonly known as:  MEVACOR  Take 40 mg by mouth at bedtime.     oxyCODONE 5 MG immediate release tablet  Commonly known as:  Oxy IR/ROXICODONE  1-2 tablets every 4-6 hrs as needed for pain     RESTORIL 30 MG capsule  Generic drug:  temazepam  Take 30 mg by mouth at bedtime as needed for sleep.     silodosin 8 MG Caps capsule  Commonly known as:  RAPAFLO  Take 8 mg by mouth daily with breakfast.     vitamin B-12 1000 MCG tablet  Commonly known as:  CYANOCOBALAMIN  Take 2,000 mcg by mouth daily.  Vitamin D-3 5000 UNITS Tabs  Take 5,000 Units by mouth daily.        Diagnostic Studies: Dg Chest 2 View  05/05/2013   CLINICAL DATA:  Hypertension, preop for left knee arthroplasty  EXAM: CHEST  2 VIEW  COMPARISON:  01/22/2009  FINDINGS: Cardiomediastinal silhouette is stable. No  acute infiltrate or pleural effusion. No pulmonary edema. Mild degenerative changes thoracic spine.  IMPRESSION: No active cardiopulmonary disease.   Electronically Signed   By: Lahoma Crocker M.D.   On: 05/05/2013 10:53   Dg Chest Port 1 View  05/12/2013   CLINICAL DATA:  Reason cold.  Preop.  EXAM: PORTABLE CHEST - 1 VIEW  COMPARISON:  DG CHEST 2 VIEW dated 05/05/2013  FINDINGS: The heart size and mediastinal contours are within normal limits. Both lungs are clear. The visualized skeletal structures are unremarkable.  IMPRESSION: No active disease.   Electronically Signed   By: Rolm Baptise M.D.   On: 05/12/2013 06:32    Disposition: Final discharge disposition not confirmed      Discharge Orders   Future Appointments Provider Department Dept Phone   07/01/2013 1:15 PM Cassandria Anger, MD [place] [phone]   Future Orders Complete By Expires   Call MD / Call 911  As directed    Comments:     If you experience chest pain or shortness of breath, CALL 911 and be transported to the hospital emergency room.  If you develope a fever above 101 F, pus (white drainage) or increased drainage or redness at the wound, or calf pain, call your surgeon's office.   Change dressing  As directed    Comments:     Change the dressing daily with sterile 4 x 4 inch gauze dressing and apply TED hose.  You may clean the incision with alcohol prior to redressing.   Constipation Prevention  As directed    Comments:     Drink plenty of fluids.  Prune juice may be helpful.  You may use a stool softener, such as Colace (over the counter) 100 mg twice a day.  Use MiraLax (over the counter) for constipation as needed.   CPM  As directed    Comments:     Continuous passive motion machine (CPM):      Use the CPM from 0 to 90 for 6 hours per day.       You may break it up into 2 or 3 sessions per day.      Use CPM for 2 weeks or until you are told to stop.   Diet - low sodium heart healthy   As directed    Discharge instructions  As directed    Comments:     Total Knee Replacement Care After Refer to this sheet in the next few weeks. These discharge instructions provide you with general information on caring for yourself after you leave the hospital. Your caregiver may also give you specific instructions. Your treatment has been planned according to the most current medical practices available, but unavoidable complications sometimes occur. If you have any problems or questions after discharge, please call your caregiver. Regaining a near full range of motion of your knee within the first 3 to 6 weeks after surgery is critical. [place] may resume a normal diet and activities as directed.  Perform exercises as directed.  Place gray foam block, curve side up under heel at all times except when in CPM or  when walking.  DO NOT modify, tear, cut, or change in any way the gray foam block. You will receive physical therapy daily  Take showers instead of baths until informed otherwise.  You may shower on Sunday.  Please wash whole leg including wound with soap and water  Change bandages (dressings)daily It is OK to take over-the-counter tylenol in addition to the oxycodone for pain, discomfort, or fever. Oxycodone is VERY constipating.  Please take stool softener twice a day and laxatives daily until bowels are regular Eat a well-balanced diet.  Avoid lifting or driving until you are instructed otherwise.  Make an appointment to see your caregiver for stitches (suture) or staple removal as directed.  If you have been sent home with a continuous passive motion machine (CPM machine), 0-90 degrees 6 hrs a day   2 hrs a shift SEEK MEDICAL CARE IF: You have swelling of your calf or leg.  You develop shortness of breath or chest pain.  You have redness, swelling, or increasing pain in the wound.  There is pus or any unusual drainage coming from the surgical site.  You  notice a bad smell coming from the surgical site or dressing.  The surgical site breaks open after sutures or staples have been removed.  There is persistent bleeding from the suture or staple line.  You are getting worse or are not improving.  You have any other questions or concerns.  SEEK IMMEDIATE MEDICAL CARE IF:  You have a fever.  You develop a rash.  You have difficulty breathing.  You develop any reaction or side effects to medicines given.  Your knee motion is decreasing rather than improving.  MAKE SURE YOU:  Understand these instructions.  Will watch your condition.  Will get help right away if you are not doing well or get worse.   Do not put a pillow under the knee. Place it under the heel.  As directed    Comments:     Place gray foam block, curve side up under heel at all times except when in CPM or when walking.  DO NOT modify, tear, cut, or change in any way the gray foam block.   Increase activity slowly as tolerated  As directed    TED hose  As directed    Comments:     Use stockings (TED hose) for 2 weeks on both leg(s).  You may remove them at night for sleeping.      Follow-up Information   Follow up with Lorn Junes, MD On 05/26/2013. (appt time 2:15, For wound re-check)    Specialty:  Orthopedic Surgery   Contact information:   [place] [address] [phone]        Signed: Linda Hedges 05/13/2013, 9:09 AM

## 2013-05-14 ENCOUNTER — Encounter (HOSPITAL_COMMUNITY): Payer: Self-pay | Admitting: Orthopedic Surgery

## 2013-06-23 ENCOUNTER — Other Ambulatory Visit (HOSPITAL_COMMUNITY): Payer: BC Managed Care – PPO

## 2013-06-24 ENCOUNTER — Encounter (HOSPITAL_COMMUNITY): Payer: Self-pay | Admitting: Pharmacy Technician

## 2013-06-25 ENCOUNTER — Encounter: Payer: Self-pay | Admitting: Physician Assistant

## 2013-06-25 ENCOUNTER — Other Ambulatory Visit: Payer: Self-pay | Admitting: Physician Assistant

## 2013-06-25 DIAGNOSIS — M1711 Unilateral primary osteoarthritis, right knee: Secondary | ICD-10-CM | POA: Insufficient documentation

## 2013-06-25 NOTE — H&P (Signed)
TOTAL KNEE ADMISSION H&P  Patient is being admitted for right total knee arthroplasty.  Subjective:  Chief Complaint:right knee pain.  HPI: Lee Owens, 63 y.o. male, has a history of pain and functional disability in the right knee due to arthritis and has failed non-surgical conservative treatments for greater than 12 weeks to includeNSAID's and/or analgesics, corticosteriod injections, viscosupplementation injections, flexibility and strengthening excercises, supervised PT with diminished ADL's post treatment, weight reduction as appropriate and activity modification.  Onset of symptoms was gradual, starting 10 years ago with gradually worsening course since that time. The patient noted prior procedures on the knee to include  arthroscopy and menisectomy on the right knee(s).  Patient currently rates pain in the right knee(s) at 10 out of 10 with activity. Patient has night pain, worsening of pain with activity and weight bearing, pain that interferes with activities of daily living, crepitus and joint swelling.  Patient has evidence of subchondral sclerosis, periarticular osteophytes, joint subluxation and joint space narrowing by imaging studies.There is no active infection.  Patient Active Problem List   Diagnosis Date Noted  . Right knee DJD   . Left knee DJD 05/12/2013  . DJD (degenerative joint disease) of knee 05/12/2013  . Hyperlipidemia   . Hypertension   . Insomnia   . Anxiety   . Vitamin B12 deficiency   . BPH (benign prostatic hypertrophy)   . URI (upper respiratory infection) 07/04/2012  . Hyperglycemia 07/04/2012  . Constipation 04/03/2012  . Knee osteoarthritis 09/27/2011  . Well adult exam 09/27/2011  . Hypogonadism, male 11/30/2010  . Preop exam for internal medicine 11/30/2010  . Neoplasm of uncertain behavior of skin 04/28/2010  . CERUMEN IMPACTION 04/28/2010  . CHEST PAIN, RIGHT 01/22/2009  . TOBACCO USE, QUIT 01/22/2009  . VITAMIN D DEFICIENCY 11/29/2007  .  FIBROMYALGIA 11/29/2007  . ALLERGIC RHINITIS 08/29/2007  . HERNIA 08/29/2007  . Rosacea 08/29/2007  . SLEEP DISORDER 08/29/2007  . FATIGUE 08/29/2007  . PSA, INCREASED 08/29/2007   Past Medical History  Diagnosis Date  . Hyperlipidemia     takes Lovastatin daily  . Muscle pain     takes Baclofen daily  . Insomnia     takes Restoril nightly   . Chronic fatigue   . Allergic rhinitis   . Vitamin D deficiency disease   . Vitamin B12 deficiency 2009  . BPH (benign prostatic hypertrophy)     takes Proscar daily  . Hypertension     takes Lisinopril daily  . Night muscle spasms     takes Valium as needed  . Urinary frequency     takes rapaflo daily  . Pneumonia     at age 19  . Arthritis   . Joint pain   . Joint swelling   . Back pain     occasionally but reason unknown  . Hemorrhoids   . Diverticulosis   . Anxiety     takes Valium bid  . Depression     takes Cymbalta daily  . Right knee DJD     Past Surgical History  Procedure Laterality Date  . Prostate biopsy  11-11  . Knee arthroscopy Bilateral 2012  . Colonoscopy    . Total knee arthroplasty Left 05/12/2013    Procedure: TOTAL KNEE ARTHROPLASTY- left;  Surgeon: Robert A Wainer, MD;  Location: [facility] OR;  Service: Orthopedics;  Laterality: Left;     (Not in a hospital admission) No Known Allergies   Current Outpatient Prescriptions on File Prior to Visit    Medication Sig Dispense Refill  . aspirin 325 MG EC tablet Take 325 mg by mouth daily.      . baclofen (LIORESAL) 20 MG tablet Take 20 mg by mouth 3 (three) times daily.      . bisacodyl (DULCOLAX) 5 MG EC tablet Take 5 mg by mouth daily as needed for mild constipation or moderate constipation.      . celecoxib (CELEBREX) 200 MG capsule Take 1 capsule (200 mg total) by mouth daily.  30 capsule  3  . Cholecalciferol (VITAMIN D-3) 5000 UNITS TABS Take 5,000 Units by mouth daily.      . diazepam (VALIUM) 5 MG tablet Take 5 mg by mouth every 12 (twelve) hours as  needed for anxiety.       . docusate sodium (COLACE) 100 MG capsule Take 100 mg by mouth daily as needed for mild constipation.      . DULoxetine (CYMBALTA) 60 MG capsule Take 120 mg by mouth daily.       . fentaNYL (DURAGESIC - DOSED MCG/HR) 25 MCG/HR patch Place 25 mcg onto the skin every 3 (three) days.      . finasteride (PROSCAR) 5 MG tablet Take 5 mg by mouth daily.      Marland Kitchen lisinopril (PRINIVIL,ZESTRIL) 5 MG tablet Take 5 mg by mouth daily.      Marland Kitchen LORazepam (ATIVAN) 1 MG tablet Take 1 tablet (1 mg total) by mouth every 8 (eight) hours as needed for anxiety.  60 tablet  0  . lovastatin (MEVACOR) 40 MG tablet Take 40 mg by mouth at bedtime.      Marland Kitchen oxyCODONE (OXY IR/ROXICODONE) 5 MG immediate release tablet Take 5 mg by mouth every 4 (four) hours as needed for moderate pain or severe pain.      . Probiotic Product (ALIGN PO) Take 1 capsule by mouth daily.        . silodosin (RAPAFLO) 8 MG CAPS capsule Take 8 mg by mouth as needed (to relieve urinary symptoms).       . temazepam (RESTORIL) 30 MG capsule Take 30-60 mg by mouth at bedtime as needed for sleep.       . vitamin B-12 (CYANOCOBALAMIN) 1000 MCG tablet Take 2,000 mcg by mouth daily.        No current facility-administered medications on file prior to visit.   History  Substance Use Topics  . Smoking status: Former Research scientist (life sciences)  . Smokeless tobacco: Not on file     Comment: quit smoking in 7 1/61yrs ago  . Alcohol Use: Yes     Comment: daily    Family History  Problem Relation Age of Onset  . Hypertension Other   . Coronary artery disease Other      Review of Systems  Constitutional: Negative.   HENT: Negative.   Eyes: Negative.   Respiratory: Negative.   Cardiovascular: Negative.   Gastrointestinal: Negative.   Genitourinary: Negative.   Musculoskeletal: Positive for back pain and joint pain.  Skin: Negative.   Neurological: Negative.   Endo/Heme/Allergies: Negative.   Psychiatric/Behavioral: Positive for depression.  Negative for suicidal ideas, hallucinations, memory loss and substance abuse. The patient is nervous/anxious. The patient does not have insomnia.     Objective:  Physical Exam  Constitutional: He is oriented to person, place, and time. He appears well-developed and well-nourished.  HENT:  Head: Normocephalic and atraumatic.  Mouth/Throat: Oropharynx is clear and moist.  Eyes: Conjunctivae and EOM are normal. Pupils are equal, round, and reactive  to light.  Cardiovascular: Normal rate and regular rhythm.   Respiratory: Effort normal and breath sounds normal.  GI: Soft. Bowel sounds are normal.  Musculoskeletal:  Right knee has ROM 0-120 with significant varus deformity. 2+crepititus.  2+synovitis. Left knee has ROM 0-120 well healed total knee replacement scar. 2+DP  Neurological: He is alert and oriented to person, place, and time.  Skin: Skin is warm and dry.  Psychiatric: He has a normal mood and affect. His behavior is normal.    Vital signs in last 24 hours: Last recorded: 04/01 1300   BP: 125/82    Temp: 98.2 F (36.8 C)    Height: 6' (1.829 m) SpO2: 97  Weight: 92.534 kg (204 lb)     Labs:   Estimated body mass index is 27.66 kg/(m^2) as calculated from the following:   Height as of this encounter: 6' (1.829 m).   Weight as of this encounter: 92.534 kg (204 lb).   Imaging Review Plain radiographs demonstrate severe degenerative joint disease of the right knee(s). The overall alignment issignificant varus. The bone quality appears to be good for age and reported activity level.  Assessment/Plan:  End stage arthritis, right knee   The patient history, physical examination, clinical judgment of the provider and imaging studies are consistent with end stage degenerative joint disease of the right knee(s) and total knee arthroplasty is deemed medically necessary. The treatment options including medical management, injection therapy arthroscopy and arthroplasty were  discussed at length. The risks and benefits of total knee arthroplasty were presented and reviewed. The risks due to aseptic loosening, infection, stiffness, patella tracking problems, thromboembolic complications and other imponderables were discussed. The patient acknowledged the explanation, agreed to proceed with the plan and consent was signed. Patient is being admitted for inpatient treatment for surgery, pain control, PT, OT, prophylactic antibiotics, VTE prophylaxis, progressive ambulation and ADL's and discharge planning. The patient is planning to be discharged home with home health services  Veena Sturgess A. Eugenio Dollins, PA-C Physician Assistant [facility] [phone]  06/25/2013, 2:42 PM   

## 2013-06-30 ENCOUNTER — Encounter (HOSPITAL_COMMUNITY): Payer: Self-pay

## 2013-06-30 ENCOUNTER — Encounter (HOSPITAL_COMMUNITY)
Admission: RE | Admit: 2013-06-30 | Discharge: 2013-06-30 | Disposition: A | Discharge status: Home / Self Care | Payer: BC Managed Care – PPO | Source: Ambulatory Visit | Attending: Orthopedic Surgery | Admitting: Orthopedic Surgery

## 2013-06-30 DIAGNOSIS — Z01812 Encounter for preprocedural laboratory examination: Secondary | ICD-10-CM | POA: Insufficient documentation

## 2013-06-30 LAB — URINALYSIS, ROUTINE W REFLEX MICROSCOPIC
Bilirubin Urine: NEGATIVE
GLUCOSE, UA: NEGATIVE mg/dL
HGB URINE DIPSTICK: NEGATIVE
Ketones, ur: NEGATIVE mg/dL
Leukocytes, UA: NEGATIVE
Nitrite: NEGATIVE
PROTEIN: NEGATIVE mg/dL
Specific Gravity, Urine: 1.02 (ref 1.005–1.030)
UROBILINOGEN UA: 0.2 mg/dL (ref 0.0–1.0)
pH: 5.5 (ref 5.0–8.0)

## 2013-06-30 LAB — CBC WITH DIFFERENTIAL/PLATELET
Basophils Absolute: 0 10*3/uL (ref 0.0–0.1)
Basophils Relative: 0 % (ref 0–1)
EOS ABS: 0.3 10*3/uL (ref 0.0–0.7)
EOS PCT: 4 % (ref 0–5)
HEMATOCRIT: 45.1 % (ref 39.0–52.0)
Hemoglobin: 15.4 g/dL (ref 13.0–17.0)
Lymphocytes Relative: 21 % (ref 12–46)
Lymphs Abs: 1.5 10*3/uL (ref 0.7–4.0)
MCH: 31.2 pg (ref 26.0–34.0)
MCHC: 34.1 g/dL (ref 30.0–36.0)
MCV: 91.5 fL (ref 78.0–100.0)
MONO ABS: 0.4 10*3/uL (ref 0.1–1.0)
Monocytes Relative: 5 % (ref 3–12)
Neutro Abs: 5.2 10*3/uL (ref 1.7–7.7)
Neutrophils Relative %: 70 % (ref 43–77)
PLATELETS: 250 10*3/uL (ref 150–400)
RBC: 4.93 MIL/uL (ref 4.22–5.81)
RDW: 13.1 % (ref 11.5–15.5)
WBC: 7.4 10*3/uL (ref 4.0–10.5)

## 2013-06-30 LAB — COMPREHENSIVE METABOLIC PANEL
ALT: 17 U/L (ref 0–53)
AST: 16 U/L (ref 0–37)
Albumin: 3.7 g/dL (ref 3.5–5.2)
Alkaline Phosphatase: 79 U/L (ref 39–117)
BUN: 12 mg/dL (ref 6–23)
CALCIUM: 9.6 mg/dL (ref 8.4–10.5)
CO2: 26 mEq/L (ref 19–32)
CREATININE: 0.73 mg/dL (ref 0.50–1.35)
Chloride: 102 mEq/L (ref 96–112)
GFR calc non Af Amer: >90 mL/min (ref >90)
GLUCOSE: 109 mg/dL — AB (ref 70–99)
Potassium: 4.5 mEq/L (ref 3.7–5.3)
Sodium: 142 mEq/L (ref 137–147)
TOTAL PROTEIN: 6.8 g/dL (ref 6.0–8.3)
Total Bilirubin: 0.5 mg/dL (ref 0.3–1.2)

## 2013-06-30 LAB — TYPE AND SCREEN
ABO/RH(D): B POS
ANTIBODY SCREEN: NEGATIVE

## 2013-06-30 LAB — SURGICAL PCR SCREEN
MRSA, PCR: NEGATIVE
Staphylococcus aureus: NEGATIVE

## 2013-06-30 LAB — PROTIME-INR
INR: 0.96 (ref 0.00–1.49)
Prothrombin Time: 12.6 seconds (ref 11.6–15.2)

## 2013-06-30 LAB — APTT: aPTT: 34 seconds (ref 24–37)

## 2013-06-30 MED ORDER — POVIDONE-IODINE 7.5 % EX SOLN: Freq: Once | CUTANEOUS | Status: DC

## 2013-06-30 MED ORDER — CHLORHEXIDINE GLUCONATE 4 % EX LIQD: 60.0000 mL | Freq: Once | CUTANEOUS | Status: DC

## 2013-06-30 NOTE — Pre-Procedure Instructions (Signed)
Lee Owens  06/30/2013   Your procedure is scheduled on:  07/07/13  Report to [facility]  2 * 3 at 530 AM.  Call this number if you have problems the morning of surgery: [phone]   Remember:   Do not eat food or drink liquids after midnight.   Take these medicines the morning of surgery with A SIP OF WATER: baclofen,valium,cymbalta,patchfor pain,proscar,oxir   Do not wear jewelry, make-up or nail polish.  Do not wear lotions, powders, or perfumes. You may wear deodorant.  Do not shave 48 hours prior to surgery. Men may shave face and neck.  Do not bring valuables to the hospital.  [facility] is not responsible                  for any belongings or valuables.               Contacts, dentures or bridgework may not be worn into surgery.  Leave suitcase in the car. After surgery it may be brought to your room.  For patients admitted to the hospital, discharge time is determined by your                treatment team.               Patients discharged the day of surgery will not be allowed to drive  home.  Name and phone number of your driver: family  Special Instructions: Shower using CHG 2 nights before surgery and the night before surgery.  If you shower the day of surgery use CHG.  Use special wash - you have one bottle of CHG for all showers.  You should use approximately 1/3 of the bottle for each shower.   Please read over the following fact sheets that you were given: Pain Booklet, Coughing and Deep Breathing, Blood Transfusion Information, MRSA Information and Surgical Site Infection Prevention

## 2013-07-01 ENCOUNTER — Encounter: Payer: Self-pay | Admitting: Internal Medicine

## 2013-07-01 ENCOUNTER — Ambulatory Visit (INDEPENDENT_AMBULATORY_CARE_PROVIDER_SITE_OTHER): Payer: BC Managed Care – PPO | Admitting: Internal Medicine

## 2013-07-01 VITALS — BP 122/80 | HR 82 | Temp 97.6°F | Wt 205.0 lb

## 2013-07-01 DIAGNOSIS — R739 Hyperglycemia, unspecified: Secondary | ICD-10-CM

## 2013-07-01 DIAGNOSIS — M1711 Unilateral primary osteoarthritis, right knee: Secondary | ICD-10-CM

## 2013-07-01 DIAGNOSIS — G4700 Insomnia, unspecified: Secondary | ICD-10-CM

## 2013-07-01 DIAGNOSIS — G479 Sleep disorder, unspecified: Secondary | ICD-10-CM

## 2013-07-01 DIAGNOSIS — E538 Deficiency of other specified B group vitamins: Secondary | ICD-10-CM

## 2013-07-01 DIAGNOSIS — M1710 Unilateral primary osteoarthritis, unspecified knee: Secondary | ICD-10-CM

## 2013-07-01 DIAGNOSIS — IMO0001 Reserved for inherently not codable concepts without codable children: Secondary | ICD-10-CM

## 2013-07-01 DIAGNOSIS — R7309 Other abnormal glucose: Secondary | ICD-10-CM

## 2013-07-01 DIAGNOSIS — IMO0002 Reserved for concepts with insufficient information to code with codable children: Secondary | ICD-10-CM

## 2013-07-01 LAB — URINE CULTURE
COLONY COUNT: NO GROWTH
Culture: NO GROWTH

## 2013-07-01 MED ORDER — OXYCODONE HCL 5 MG PO TABS: 5.0000 mg | ORAL_TABLET | ORAL | Status: DC | PRN

## 2013-07-01 MED ORDER — FENTANYL 25 MCG/HR TD PT72: 25.0000 ug | MEDICATED_PATCH | TRANSDERMAL | Status: DC

## 2013-07-01 NOTE — Progress Notes (Signed)
   Subjective:    HPI  C/o worsening knee pain. Getting  R TKR on 4/15 by Dr Noemi Chapel next wk  The patient is here to follow up on chronic HTN, dyslipidemia, insomnia and chronic moderate spastic fibromyalgia/CFS symptoms controlled some with medicines. FMS is 80% better on Fentanyl/Tramadol    F/u cramps  Wt Readings from Last 3 Encounters:  07/01/13 205 lb (92.987 kg)  06/30/13 201 lb 11.2 oz (91.491 kg)  06/25/13 204 lb (92.534 kg)   BP Readings from Last 3 Encounters:  07/01/13 122/80  06/30/13 139/92  06/25/13 125/82      Review of Systems  Constitutional: Positive for fatigue. Negative for appetite change and unexpected weight change.  HENT: Negative for congestion, nosebleeds, sneezing, sore throat and trouble swallowing.   Eyes: Negative for itching and visual disturbance.  Respiratory: Negative for cough.   Cardiovascular: Negative for chest pain, palpitations and leg swelling.  Gastrointestinal: Negative for nausea, diarrhea, blood in stool and abdominal distention.  Genitourinary: Negative for frequency and hematuria.  Musculoskeletal: Positive for arthralgias, back pain and myalgias. Negative for gait problem, joint swelling and neck pain.  Skin: Negative for rash.  Neurological: Negative for dizziness, tremors, speech difficulty and weakness.  Psychiatric/Behavioral: Positive for sleep disturbance. Negative for suicidal ideas, dysphoric mood and agitation. The patient is not nervous/anxious.        Objective:   Physical Exam  Constitutional: He is oriented to person, place, and time. He appears well-developed. No distress.  obese  HENT:  Mouth/Throat: Oropharynx is clear and moist.  Eyes: Conjunctivae are normal. Pupils are equal, round, and reactive to light.  Neck: Normal range of motion. No JVD present. No thyromegaly present.  Cardiovascular: Normal rate, regular rhythm, normal heart sounds and intact distal pulses.  Exam reveals no gallop and no  friction rub.   No murmur heard. Pulmonary/Chest: Effort normal and breath sounds normal. No respiratory distress. He has no wheezes. He has no rales. He exhibits no tenderness.  Abdominal: Soft. Bowel sounds are normal. He exhibits no distension and no mass. There is no tenderness. There is no rebound and no guarding.  Genitourinary:  Refused - def to Urol  Musculoskeletal: He exhibits edema and tenderness.  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  Neurological: He is alert and oriented to person, place, and time. He has normal reflexes. No cranial nerve deficit. He exhibits normal muscle tone. Coordination normal.  Skin: Skin is warm and dry. No rash noted. No erythema.  Psychiatric: He has a normal mood and affect. His behavior is normal. Judgment and thought content normal.  Limping less  Lab Results  Component Value Date   WBC 7.4 06/30/2013   HGB 15.4 06/30/2013   HCT 45.1 06/30/2013   PLT 250 06/30/2013   GLUCOSE 109* 06/30/2013   CHOL 179 09/24/2012   TRIG 138.0 09/24/2012   HDL 53.30 09/24/2012   LDLCALC 98 09/24/2012   ALT 17 06/30/2013   AST 16 06/30/2013   NA 142 06/30/2013   K 4.5 06/30/2013   CL 102 06/30/2013   CREATININE 0.73 06/30/2013   BUN 12 06/30/2013   CO2 26 06/30/2013   TSH 1.65 09/24/2012   PSA 1.23 09/24/2012   INR 0.96 06/30/2013   HGBA1C 5.6 01/15/2009          Assessment & Plan:

## 2013-07-01 NOTE — Assessment & Plan Note (Signed)
Continue with current prescription therapy as reflected on the Med list.  

## 2013-07-01 NOTE — Progress Notes (Signed)
Pre visit review using our clinic review tool, if applicable. No additional management support is needed unless otherwise documented below in the visit note. 

## 2013-07-01 NOTE — Assessment & Plan Note (Signed)
Mild - cont to watch

## 2013-07-01 NOTE — Assessment & Plan Note (Signed)
TKR next week

## 2013-07-06 MED ORDER — CHLORHEXIDINE GLUCONATE 4 % EX LIQD
60.0000 mL | Freq: Once | CUTANEOUS | Status: DC
  Filled 2013-07-06: qty 60

## 2013-07-06 MED ORDER — CEFAZOLIN SODIUM-DEXTROSE 2-3 GM-% IV SOLR
2.0000 g | INTRAVENOUS | Status: AC
  Administered 2013-07-07: 2 g via INTRAVENOUS
  Filled 2013-07-06: qty 50

## 2013-07-07 ENCOUNTER — Encounter (HOSPITAL_COMMUNITY): Payer: BC Managed Care – PPO | Admitting: Certified Registered"

## 2013-07-07 ENCOUNTER — Encounter (HOSPITAL_COMMUNITY): Payer: Self-pay | Admitting: *Deleted

## 2013-07-07 ENCOUNTER — Encounter (HOSPITAL_COMMUNITY)
Admission: RE | Disposition: A | Discharge status: Home Health | Payer: Self-pay | Source: Ambulatory Visit | Attending: Orthopedic Surgery

## 2013-07-07 ENCOUNTER — Inpatient Hospital Stay (HOSPITAL_COMMUNITY): Payer: BC Managed Care – PPO | Admitting: Certified Registered"

## 2013-07-07 ENCOUNTER — Inpatient Hospital Stay (HOSPITAL_COMMUNITY)
Admission: RE | Admit: 2013-07-07 | Discharge: 2013-07-08 | DRG: 470 | Disposition: A | Discharge status: Home Health | Payer: BC Managed Care – PPO | Source: Ambulatory Visit | Attending: Orthopedic Surgery | Admitting: Orthopedic Surgery

## 2013-07-07 DIAGNOSIS — F3289 Other specified depressive episodes: Secondary | ICD-10-CM | POA: Diagnosis present

## 2013-07-07 DIAGNOSIS — Z96652 Presence of left artificial knee joint: Secondary | ICD-10-CM

## 2013-07-07 DIAGNOSIS — K5730 Diverticulosis of large intestine without perforation or abscess without bleeding: Secondary | ICD-10-CM | POA: Diagnosis present

## 2013-07-07 DIAGNOSIS — G4700 Insomnia, unspecified: Secondary | ICD-10-CM | POA: Diagnosis present

## 2013-07-07 DIAGNOSIS — F329 Major depressive disorder, single episode, unspecified: Secondary | ICD-10-CM | POA: Diagnosis present

## 2013-07-07 DIAGNOSIS — Z79899 Other long term (current) drug therapy: Secondary | ICD-10-CM

## 2013-07-07 DIAGNOSIS — I10 Essential (primary) hypertension: Secondary | ICD-10-CM | POA: Diagnosis present

## 2013-07-07 DIAGNOSIS — N400 Enlarged prostate without lower urinary tract symptoms: Secondary | ICD-10-CM | POA: Diagnosis present

## 2013-07-07 DIAGNOSIS — E559 Vitamin D deficiency, unspecified: Secondary | ICD-10-CM | POA: Diagnosis present

## 2013-07-07 DIAGNOSIS — M1711 Unilateral primary osteoarthritis, right knee: Secondary | ICD-10-CM

## 2013-07-07 DIAGNOSIS — Z87891 Personal history of nicotine dependence: Secondary | ICD-10-CM

## 2013-07-07 DIAGNOSIS — Z96659 Presence of unspecified artificial knee joint: Secondary | ICD-10-CM

## 2013-07-07 DIAGNOSIS — F411 Generalized anxiety disorder: Secondary | ICD-10-CM | POA: Diagnosis present

## 2013-07-07 DIAGNOSIS — Z8249 Family history of ischemic heart disease and other diseases of the circulatory system: Secondary | ICD-10-CM

## 2013-07-07 DIAGNOSIS — M179 Osteoarthritis of knee, unspecified: Secondary | ICD-10-CM | POA: Diagnosis present

## 2013-07-07 DIAGNOSIS — G479 Sleep disorder, unspecified: Secondary | ICD-10-CM | POA: Diagnosis present

## 2013-07-07 DIAGNOSIS — IMO0001 Reserved for inherently not codable concepts without codable children: Secondary | ICD-10-CM | POA: Diagnosis present

## 2013-07-07 DIAGNOSIS — E538 Deficiency of other specified B group vitamins: Secondary | ICD-10-CM | POA: Diagnosis present

## 2013-07-07 DIAGNOSIS — Z7982 Long term (current) use of aspirin: Secondary | ICD-10-CM

## 2013-07-07 DIAGNOSIS — M791 Myalgia, unspecified site: Secondary | ICD-10-CM | POA: Diagnosis present

## 2013-07-07 DIAGNOSIS — M1710 Unilateral primary osteoarthritis, unspecified knee: Secondary | ICD-10-CM | POA: Diagnosis present

## 2013-07-07 DIAGNOSIS — R972 Elevated prostate specific antigen [PSA]: Secondary | ICD-10-CM | POA: Diagnosis present

## 2013-07-07 DIAGNOSIS — E785 Hyperlipidemia, unspecified: Secondary | ICD-10-CM | POA: Diagnosis present

## 2013-07-07 HISTORY — PX: TOTAL KNEE ARTHROPLASTY: SHX125

## 2013-07-07 SURGERY — ARTHROPLASTY, KNEE, TOTAL
Anesthesia: General | Site: Knee | Laterality: Right

## 2013-07-07 MED ORDER — LORAZEPAM 1 MG PO TABS: 1.0000 mg | ORAL_TABLET | Freq: Three times a day (TID) | ORAL | Status: DC | PRN

## 2013-07-07 MED ORDER — TEMAZEPAM 15 MG PO CAPS: 30.0000 mg | ORAL_CAPSULE | Freq: Every evening | ORAL | Status: DC | PRN

## 2013-07-07 MED ORDER — 0.9 % SODIUM CHLORIDE (POUR BTL) OPTIME
TOPICAL | Status: DC | PRN
  Administered 2013-07-07: 1000 mL

## 2013-07-07 MED ORDER — DOCUSATE SODIUM 100 MG PO CAPS
100.0000 mg | ORAL_CAPSULE | Freq: Two times a day (BID) | ORAL | Status: DC
  Filled 2013-07-07 (×2): qty 1

## 2013-07-07 MED ORDER — DEXAMETHASONE 6 MG PO TABS
10.0000 mg | ORAL_TABLET | Freq: Three times a day (TID) | ORAL | Status: AC
  Administered 2013-07-07 (×2): 10 mg via ORAL
  Filled 2013-07-07 (×4): qty 1

## 2013-07-07 MED ORDER — MIDAZOLAM HCL 5 MG/5ML IJ SOLN
INTRAMUSCULAR | Status: DC | PRN
  Administered 2013-07-07 (×2): 2 mg via INTRAVENOUS

## 2013-07-07 MED ORDER — OXYCODONE HCL 5 MG/5ML PO SOLN: 5.0000 mg | Freq: Once | ORAL | Status: AC | PRN

## 2013-07-07 MED ORDER — DULOXETINE HCL 60 MG PO CPEP
120.0000 mg | ORAL_CAPSULE | Freq: Every day | ORAL | Status: DC
  Filled 2013-07-07: qty 2

## 2013-07-07 MED ORDER — METOCLOPRAMIDE HCL 10 MG PO TABS: 5.0000 mg | ORAL_TABLET | Freq: Three times a day (TID) | ORAL | Status: DC | PRN

## 2013-07-07 MED ORDER — MEPERIDINE HCL 25 MG/ML IJ SOLN: 6.2500 mg | INTRAMUSCULAR | Status: DC | PRN

## 2013-07-07 MED ORDER — LIDOCAINE HCL (CARDIAC) 20 MG/ML IV SOLN
INTRAVENOUS | Status: AC
  Filled 2013-07-07: qty 5

## 2013-07-07 MED ORDER — CELECOXIB 200 MG PO CAPS
200.0000 mg | ORAL_CAPSULE | Freq: Two times a day (BID) | ORAL | Status: DC
  Administered 2013-07-07 (×2): 200 mg via ORAL
  Filled 2013-07-07 (×4): qty 1

## 2013-07-07 MED ORDER — BUPIVACAINE-EPINEPHRINE (PF) 0.25% -1:200000 IJ SOLN
INTRAMUSCULAR | Status: AC
  Filled 2013-07-07: qty 30

## 2013-07-07 MED ORDER — FINASTERIDE 5 MG PO TABS
5.0000 mg | ORAL_TABLET | Freq: Every day | ORAL | Status: DC
  Administered 2013-07-07: 5 mg via ORAL
  Filled 2013-07-07 (×2): qty 1

## 2013-07-07 MED ORDER — SODIUM CHLORIDE 0.9 % IR SOLN
Status: DC | PRN
  Administered 2013-07-07: 1000 mL

## 2013-07-07 MED ORDER — DEXAMETHASONE SODIUM PHOSPHATE 10 MG/ML IJ SOLN
INTRAMUSCULAR | Status: DC | PRN
  Administered 2013-07-07: 10 mg via INTRAVENOUS

## 2013-07-07 MED ORDER — BACLOFEN 20 MG PO TABS
20.0000 mg | ORAL_TABLET | Freq: Three times a day (TID) | ORAL | Status: DC
  Administered 2013-07-07: 20 mg via ORAL
  Filled 2013-07-07 (×3): qty 1

## 2013-07-07 MED ORDER — HYDROMORPHONE HCL PF 1 MG/ML IJ SOLN
0.2500 mg | INTRAMUSCULAR | Status: DC | PRN
  Administered 2013-07-07 (×4): 0.5 mg via INTRAVENOUS

## 2013-07-07 MED ORDER — BUPIVACAINE-EPINEPHRINE PF 0.5-1:200000 % IJ SOLN
INTRAMUSCULAR | Status: DC | PRN
  Administered 2013-07-07: 30 mL

## 2013-07-07 MED ORDER — FENTANYL 50 MCG/HR TD PT72
50.0000 ug | MEDICATED_PATCH | TRANSDERMAL | Status: DC
Start: 2013-07-07 — End: 2013-07-08
  Administered 2013-07-07: 50 ug via TRANSDERMAL
  Filled 2013-07-07: qty 1

## 2013-07-07 MED ORDER — ACETAMINOPHEN 325 MG PO TABS: 650.0000 mg | ORAL_TABLET | Freq: Four times a day (QID) | ORAL | Status: DC | PRN

## 2013-07-07 MED ORDER — OXYCODONE HCL 5 MG PO TABS
ORAL_TABLET | ORAL | Status: AC
  Filled 2013-07-07: qty 1

## 2013-07-07 MED ORDER — ALUM & MAG HYDROXIDE-SIMETH 200-200-20 MG/5ML PO SUSP: 30.0000 mL | ORAL | Status: DC | PRN

## 2013-07-07 MED ORDER — DEXAMETHASONE SODIUM PHOSPHATE 10 MG/ML IJ SOLN
10.0000 mg | Freq: Three times a day (TID) | INTRAMUSCULAR | Status: AC
Start: 2013-07-07 — End: 2013-07-08
  Administered 2013-07-07: 10 mg via INTRAVENOUS
  Filled 2013-07-07 (×3): qty 1

## 2013-07-07 MED ORDER — HYDROMORPHONE HCL PF 1 MG/ML IJ SOLN
INTRAMUSCULAR | Status: AC
  Filled 2013-07-07: qty 2

## 2013-07-07 MED ORDER — PROPOFOL 10 MG/ML IV BOLUS
INTRAVENOUS | Status: DC | PRN
  Administered 2013-07-07: 200 mg via INTRAVENOUS

## 2013-07-07 MED ORDER — SODIUM CHLORIDE 0.9 % IJ SOLN
INTRAMUSCULAR | Status: AC
  Filled 2013-07-07: qty 10

## 2013-07-07 MED ORDER — VITAMIN B-12 1000 MCG PO TABS
2000.0000 ug | ORAL_TABLET | Freq: Every day | ORAL | Status: DC
  Administered 2013-07-07: 2000 ug via ORAL
  Filled 2013-07-07 (×2): qty 2

## 2013-07-07 MED ORDER — ROCURONIUM BROMIDE 50 MG/5ML IV SOLN
INTRAVENOUS | Status: AC
  Filled 2013-07-07: qty 1

## 2013-07-07 MED ORDER — LACTATED RINGERS IV SOLN
INTRAVENOUS | Status: DC
  Administered 2013-07-07 (×2): via INTRAVENOUS

## 2013-07-07 MED ORDER — CEFAZOLIN SODIUM-DEXTROSE 2-3 GM-% IV SOLR
2.0000 g | Freq: Four times a day (QID) | INTRAVENOUS | Status: AC
  Administered 2013-07-07 (×2): 2 g via INTRAVENOUS
  Filled 2013-07-07 (×2): qty 50

## 2013-07-07 MED ORDER — HYDROMORPHONE HCL PF 1 MG/ML IJ SOLN
1.0000 mg | INTRAMUSCULAR | Status: DC | PRN
  Administered 2013-07-07 (×4): 2 mg via INTRAVENOUS
  Administered 2013-07-08 (×2): 1 mg via INTRAVENOUS
  Filled 2013-07-07 (×2): qty 2
  Filled 2013-07-07 (×2): qty 1
  Filled 2013-07-07 (×2): qty 2

## 2013-07-07 MED ORDER — DEXAMETHASONE SODIUM PHOSPHATE 10 MG/ML IJ SOLN
INTRAMUSCULAR | Status: AC
  Filled 2013-07-07: qty 1

## 2013-07-07 MED ORDER — PHENOL 1.4 % MT LIQD
1.0000 | OROMUCOSAL | Status: DC | PRN
Start: 2013-07-07 — End: 2013-07-08

## 2013-07-07 MED ORDER — METOCLOPRAMIDE HCL 5 MG/ML IJ SOLN: 5.0000 mg | Freq: Three times a day (TID) | INTRAMUSCULAR | Status: DC | PRN

## 2013-07-07 MED ORDER — PROPOFOL 10 MG/ML IV BOLUS
INTRAVENOUS | Status: AC
  Filled 2013-07-07: qty 20

## 2013-07-07 MED ORDER — SUFENTANIL CITRATE 50 MCG/ML IV SOLN
INTRAVENOUS | Status: AC
  Filled 2013-07-07: qty 1

## 2013-07-07 MED ORDER — SIMVASTATIN 20 MG PO TABS
20.0000 mg | ORAL_TABLET | Freq: Every day | ORAL | Status: DC
  Administered 2013-07-07: 20 mg via ORAL
  Filled 2013-07-07 (×2): qty 1

## 2013-07-07 MED ORDER — MIDAZOLAM HCL 2 MG/2ML IJ SOLN: 0.5000 mg | Freq: Once | INTRAMUSCULAR | Status: DC | PRN

## 2013-07-07 MED ORDER — MIDAZOLAM HCL 2 MG/2ML IJ SOLN
INTRAMUSCULAR | Status: AC
  Filled 2013-07-07: qty 2

## 2013-07-07 MED ORDER — DIAZEPAM 5 MG PO TABS
5.0000 mg | ORAL_TABLET | Freq: Two times a day (BID) | ORAL | Status: DC | PRN
  Administered 2013-07-07: 5 mg via ORAL
  Filled 2013-07-07: qty 1

## 2013-07-07 MED ORDER — VITAMIN D 1000 UNITS PO TABS
5000.0000 [IU] | ORAL_TABLET | Freq: Every day | ORAL | Status: DC
  Administered 2013-07-07: 5000 [IU] via ORAL
  Filled 2013-07-07 (×2): qty 5

## 2013-07-07 MED ORDER — BUPIVACAINE-EPINEPHRINE 0.25% -1:200000 IJ SOLN
INTRAMUSCULAR | Status: DC | PRN
  Administered 2013-07-07: 30 mL

## 2013-07-07 MED ORDER — ONDANSETRON HCL 4 MG PO TABS: 4.0000 mg | ORAL_TABLET | Freq: Four times a day (QID) | ORAL | Status: DC | PRN

## 2013-07-07 MED ORDER — FENTANYL 50 MCG/HR TD PT72: 50.0000 ug | MEDICATED_PATCH | TRANSDERMAL | Status: DC

## 2013-07-07 MED ORDER — OXYCODONE HCL 5 MG PO TABS
5.0000 mg | ORAL_TABLET | Freq: Once | ORAL | Status: AC | PRN
  Administered 2013-07-07: 5 mg via ORAL

## 2013-07-07 MED ORDER — ACETAMINOPHEN 650 MG RE SUPP: 650.0000 mg | Freq: Four times a day (QID) | RECTAL | Status: DC | PRN

## 2013-07-07 MED ORDER — POTASSIUM CHLORIDE IN NACL 20-0.9 MEQ/L-% IV SOLN
INTRAVENOUS | Status: DC
  Administered 2013-07-07 – 2013-07-08 (×2): via INTRAVENOUS
  Filled 2013-07-07 (×4): qty 1000

## 2013-07-07 MED ORDER — FLORA-Q PO CAPS
1.0000 | ORAL_CAPSULE | Freq: Every day | ORAL | Status: DC
  Administered 2013-07-07: 1 via ORAL
  Filled 2013-07-07 (×2): qty 1

## 2013-07-07 MED ORDER — TAMSULOSIN HCL 0.4 MG PO CAPS
0.4000 mg | ORAL_CAPSULE | Freq: Every day | ORAL | Status: DC
  Administered 2013-07-07: 0.4 mg via ORAL
  Filled 2013-07-07 (×2): qty 1

## 2013-07-07 MED ORDER — BACLOFEN 20 MG PO TABS
20.0000 mg | ORAL_TABLET | Freq: Three times a day (TID) | ORAL | Status: DC
  Administered 2013-07-07: 20 mg via ORAL
  Filled 2013-07-07 (×4): qty 1

## 2013-07-07 MED ORDER — DIPHENHYDRAMINE HCL 12.5 MG/5ML PO ELIX: 12.5000 mg | ORAL_SOLUTION | ORAL | Status: DC | PRN

## 2013-07-07 MED ORDER — MENTHOL 3 MG MT LOZG: 1.0000 | LOZENGE | OROMUCOSAL | Status: DC | PRN

## 2013-07-07 MED ORDER — ONDANSETRON HCL 4 MG/2ML IJ SOLN: 4.0000 mg | Freq: Four times a day (QID) | INTRAMUSCULAR | Status: DC | PRN

## 2013-07-07 MED ORDER — BISACODYL 5 MG PO TBEC
10.0000 mg | DELAYED_RELEASE_TABLET | Freq: Every day | ORAL | Status: DC
  Filled 2013-07-07: qty 2

## 2013-07-07 MED ORDER — LIDOCAINE HCL (CARDIAC) 20 MG/ML IV SOLN
INTRAVENOUS | Status: DC | PRN
  Administered 2013-07-07: 30 mg via INTRAVENOUS

## 2013-07-07 MED ORDER — ASPIRIN EC 325 MG PO TBEC
325.0000 mg | DELAYED_RELEASE_TABLET | Freq: Every day | ORAL | Status: DC
  Administered 2013-07-08: 325 mg via ORAL
  Filled 2013-07-07 (×2): qty 1

## 2013-07-07 MED ORDER — PROMETHAZINE HCL 25 MG/ML IJ SOLN: 6.2500 mg | INTRAMUSCULAR | Status: DC | PRN

## 2013-07-07 MED ORDER — SUFENTANIL CITRATE 50 MCG/ML IV SOLN
INTRAVENOUS | Status: DC | PRN
  Administered 2013-07-07: 10 ug via INTRAVENOUS
  Administered 2013-07-07: 5 ug via INTRAVENOUS
  Administered 2013-07-07: 15 ug via INTRAVENOUS

## 2013-07-07 MED ORDER — OXYCODONE HCL 5 MG PO TABS
15.0000 mg | ORAL_TABLET | ORAL | Status: DC | PRN
  Administered 2013-07-07 – 2013-07-08 (×6): 15 mg via ORAL
  Filled 2013-07-07 (×6): qty 3

## 2013-07-07 SURGICAL SUPPLY — 71 items
BANDAGE ESMARK 6X9 LF (GAUZE/BANDAGES/DRESSINGS) ×1 IMPLANT
BLADE SAGITTAL 25.0X1.19X90 (BLADE) ×2 IMPLANT
BLADE SAGITTAL 25.0X1.19X90MM (BLADE) ×1
BLADE SAW SGTL 11.0X1.19X90.0M (BLADE) IMPLANT
BLADE SAW SGTL 13.0X1.19X90.0M (BLADE) ×3 IMPLANT
BLADE SURG 10 STRL SS (BLADE) ×6 IMPLANT
BNDG ELASTIC 6X15 VLCR STRL LF (GAUZE/BANDAGES/DRESSINGS) ×3 IMPLANT
BNDG ESMARK 6X9 LF (GAUZE/BANDAGES/DRESSINGS) ×3
BOWL SMART MIX CTS (DISPOSABLE) ×3 IMPLANT
CAPT RP KNEE ×3 IMPLANT
CEMENT HV SMART SET (Cement) ×6 IMPLANT
CLOSURE STERI-STRIP 1/2X4 (GAUZE/BANDAGES/DRESSINGS) ×1
CLOSURE WOUND 1/2 X4 (GAUZE/BANDAGES/DRESSINGS) ×1
CLSR STERI-STRIP ANTIMIC 1/2X4 (GAUZE/BANDAGES/DRESSINGS) ×2 IMPLANT
COVER SURGICAL LIGHT HANDLE (MISCELLANEOUS) ×3 IMPLANT
CUFF TOURNIQUET SINGLE 34IN LL (TOURNIQUET CUFF) ×3 IMPLANT
CUFF TOURNIQUET SINGLE 44IN (TOURNIQUET CUFF) IMPLANT
DRAPE EXTREMITY T 121X128X90 (DRAPE) ×3 IMPLANT
DRAPE INCISE IOBAN 66X45 STRL (DRAPES) ×3 IMPLANT
DRAPE PROXIMA HALF (DRAPES) ×3 IMPLANT
DRAPE U-SHAPE 47X51 STRL (DRAPES) ×3 IMPLANT
DRSG ADAPTIC 3X8 NADH LF (GAUZE/BANDAGES/DRESSINGS) ×3 IMPLANT
DRSG PAD ABDOMINAL 8X10 ST (GAUZE/BANDAGES/DRESSINGS) ×6 IMPLANT
DURAPREP 26ML APPLICATOR (WOUND CARE) ×6 IMPLANT
ELECT CAUTERY BLADE 6.4 (BLADE) ×3 IMPLANT
ELECT REM PT RETURN 9FT ADLT (ELECTROSURGICAL) ×3
ELECTRODE REM PT RTRN 9FT ADLT (ELECTROSURGICAL) ×1 IMPLANT
EVACUATOR 1/8 PVC DRAIN (DRAIN) ×3 IMPLANT
FACESHIELD WRAPAROUND (MASK) ×3 IMPLANT
GLOVE BIO SURGEON STRL SZ7 (GLOVE) ×3 IMPLANT
GLOVE BIOGEL PI IND STRL 7.0 (GLOVE) ×1 IMPLANT
GLOVE BIOGEL PI IND STRL 7.5 (GLOVE) ×1 IMPLANT
GLOVE BIOGEL PI INDICATOR 7.0 (GLOVE) ×2
GLOVE BIOGEL PI INDICATOR 7.5 (GLOVE) ×2
GLOVE SS BIOGEL STRL SZ 7.5 (GLOVE) ×1 IMPLANT
GLOVE SUPERSENSE BIOGEL SZ 7.5 (GLOVE) ×2
GOWN STRL REUS W/ TWL LRG LVL3 (GOWN DISPOSABLE) ×2 IMPLANT
GOWN STRL REUS W/ TWL XL LVL3 (GOWN DISPOSABLE) ×2 IMPLANT
GOWN STRL REUS W/TWL LRG LVL3 (GOWN DISPOSABLE) ×4
GOWN STRL REUS W/TWL XL LVL3 (GOWN DISPOSABLE) ×4
HANDPIECE INTERPULSE COAX TIP (DISPOSABLE) ×2
HOOD PEEL AWAY FACE SHEILD DIS (HOOD) ×6 IMPLANT
IMMOBILIZER KNEE 22 (SOFTGOODS) ×3 IMPLANT
IMMOBILIZER KNEE 22 UNIV (SOFTGOODS) IMPLANT
KIT BASIN OR (CUSTOM PROCEDURE TRAY) ×3 IMPLANT
KIT ROOM TURNOVER OR (KITS) ×3 IMPLANT
MANIFOLD NEPTUNE II (INSTRUMENTS) ×3 IMPLANT
NS IRRIG 1000ML POUR BTL (IV SOLUTION) ×3 IMPLANT
PACK TOTAL JOINT (CUSTOM PROCEDURE TRAY) ×3 IMPLANT
PAD ABD 8X10 STRL (GAUZE/BANDAGES/DRESSINGS) ×6 IMPLANT
PAD ARMBOARD 7.5X6 YLW CONV (MISCELLANEOUS) ×6 IMPLANT
PAD CAST 4YDX4 CTTN HI CHSV (CAST SUPPLIES) ×1 IMPLANT
PADDING CAST COTTON 4X4 STRL (CAST SUPPLIES) ×2
PADDING CAST COTTON 6X4 STRL (CAST SUPPLIES) ×3 IMPLANT
RUBBERBAND STERILE (MISCELLANEOUS) ×3 IMPLANT
SET HNDPC FAN SPRY TIP SCT (DISPOSABLE) ×1 IMPLANT
SPONGE GAUZE 4X4 12PLY (GAUZE/BANDAGES/DRESSINGS) ×3 IMPLANT
SPONGE GAUZE 4X4 12PLY STER LF (GAUZE/BANDAGES/DRESSINGS) ×3 IMPLANT
STRIP CLOSURE SKIN 1/2X4 (GAUZE/BANDAGES/DRESSINGS) ×2 IMPLANT
SUCTION FRAZIER TIP 10 FR DISP (SUCTIONS) ×3 IMPLANT
SUT ETHIBOND NAB CT1 #1 30IN (SUTURE) ×6 IMPLANT
SUT MNCRL AB 3-0 PS2 18 (SUTURE) ×3 IMPLANT
SUT VIC AB 0 CT1 27 (SUTURE) ×4
SUT VIC AB 0 CT1 27XBRD ANBCTR (SUTURE) ×2 IMPLANT
SUT VIC AB 2-0 CT1 27 (SUTURE) ×4
SUT VIC AB 2-0 CT1 TAPERPNT 27 (SUTURE) ×2 IMPLANT
SYR 30ML SLIP (SYRINGE) ×3 IMPLANT
TOWEL OR 17X24 6PK STRL BLUE (TOWEL DISPOSABLE) ×3 IMPLANT
TOWEL OR 17X26 10 PK STRL BLUE (TOWEL DISPOSABLE) ×3 IMPLANT
TRAY FOLEY CATH 16FR SILVER (SET/KITS/TRAYS/PACK) ×3 IMPLANT
WATER STERILE IRR 1000ML POUR (IV SOLUTION) ×6 IMPLANT

## 2013-07-07 NOTE — Progress Notes (Signed)
Utilization review completed.  

## 2013-07-07 NOTE — Transfer of Care (Signed)
Immediate Anesthesia Transfer of Care Note  Patient: Lee Owens  Procedure(s) Performed: Procedure(s): TOTAL KNEE ARTHROPLASTY- right (Right)  Patient Location: PACU  Anesthesia Type:GA combined with regional for post-op pain  Level of Consciousness: awake, alert  and oriented  Airway & Oxygen Therapy: Patient Spontanous Breathing and Patient connected to nasal cannula oxygen  Post-op Assessment: Report given to PACU RN and Post -op Vital signs reviewed and stable  Post vital signs: Reviewed and stable  Complications: No apparent anesthesia complications

## 2013-07-07 NOTE — Op Note (Signed)
MRN:     [id number] DOB/AGE:    1950-10-02 / 63 y.o.       OPERATIVE REPORT    DATE OF PROCEDURE:  07/07/2013       PREOPERATIVE DIAGNOSIS:   djd right knee      Estimated body mass index is 27.25 kg/(m^2) as calculated from the following:   Height as of 06/30/13: 6' (1.829 m).   Weight as of 05/07/13: 91.173 kg (201 lb).                                                        POSTOPERATIVE DIAGNOSIS:   djd right knee                                                                      PROCEDURE:  Procedure(s): TOTAL KNEE ARTHROPLASTY- right Using Depuy Sigma RP implants #4 Femur, #5Tibia, 7mm sigma RP bearing, 35 Patella     SURGEON: Lorn Junes    ASSISTANT:  Kirstin Shepperson PA-C   (Present and scrubbed throughout the case, critical for assistance with exposure, retraction, instrumentation, and closure.)         ANESTHESIA: GET with Femoral Nerve Block  DRAINS: foley, 2 medium hemovac in knee   TOURNIQUET TIME: 25DGU   COMPLICATIONS:  None     SPECIMENS: None   INDICATIONS FOR PROCEDURE: The patient has  djd right knee, varus deformities, XR shows bone on bone arthritis. Patient has failed all conservative measures including anti-inflammatory medicines, narcotics, attempts at  exercise and weight loss, cortisone injections and viscosupplementation.  Risks and benefits of surgery have been discussed, questions answered.   DESCRIPTION OF PROCEDURE: The patient identified by armband, received  right femoral nerve block and IV antibiotics, in the holding area at [facility]. Patient taken to the operating room, appropriate anesthetic  monitors were attached General endotracheal anesthesia induced with  the patient in supine position, Foley catheter was inserted. Tourniquet  applied high to the operative thigh. Lateral post and foot positioner  applied to the table, the lower extremity was then prepped and draped  in usual sterile fashion from the ankle to the tourniquet.  Time-out procedure was performed. The limb was wrapped with an Esmarch bandage and the tourniquet inflated to 365 mmHg. We began the operation by making the anterior midline incision starting at handbreadth above the patella going over the patella 1 cm medial to and  4 cm distal to the tibial tubercle. Small bleeders in the skin and the  subcutaneous tissue identified and cauterized. Transverse retinaculum was incised and reflected medially and a medial parapatellar arthrotomy was accomplished. the patella was everted and theprepatellar fat pad resected. The superficial medial collateral  ligament was then elevated from anterior to posterior along the proximal  flare of the tibia and anterior half of the menisci resected. The knee was hyperflexed exposing bone on bone arthritis. Peripheral and notch osteophytes as well as the cruciate ligaments were then resected. We continued to  work our way around posteriorly along the proximal tibia, and externally  rotated the tibia subluxing it out from underneath the femur. A McHale  retractor was placed through the notch and a lateral Hohmann retractor  placed, and we then drilled through the proximal tibia in line with the  axis of the tibia followed by an intramedullary guide rod and 2-degree  posterior slope cutting guide. The tibial cutting guide was pinned into place  allowing resection of 4 mm of bone medially and about 6 mm of bone  laterally because of her varus deformity. Satisfied with the tibial resection, we then  entered the distal femur 2 mm anterior to the PCL origin with the  intramedullary guide rod and applied the distal femoral cutting guide  set at 50mm, with 5 degrees of valgus. This was pinned along the  epicondylar axis. At this point, the distal femoral cut was accomplished without difficulty. We then sized for a #4 femoral component and pinned the guide in 3 degrees of external rotation.The chamfer cutting guide was pinned into place.  The anterior, posterior, and chamfer cuts were accomplished without difficulty followed by  the Sigma RP box cutting guide and the box cut. We also removed posterior osteophytes from the posterior femoral condyles. At this  time, the knee was brought into full extension. We checked our  extension and flexion gaps and found them symmetric at 65mm.  The patella thickness measured at 26 mm. We set the cutting guide at 17 and removed the posterior 9.5-10 mm  of the patella sized for 35 button and drilled the lollipop. The knee  was then once again hyperflexed exposing the proximal tibia. We sized for a #5 tibial base plate, applied the smokestack and the conical reamer followed by the the Delta fin keel punch. We then hammered into place the Sigma RP trial femoral component, inserted a 15-mm trial bearing, trial patellar button, and took the knee through range of motion from 0-130 degrees. No thumb pressure was required for patellar  tracking. At this point, all trial components were removed, a double batch of DePuy HV cement  was mixed and applied to all bony metallic mating surfaces except for the posterior condyles of the femur itself. In order, we  hammered into place the tibial tray and removed excess cement, the femoral component and removed excess cement, a 15-mm Sigma RP bearing  was inserted, and the knee brought to full extension with compression.  The patellar button was clamped into place, and excess cement  removed. While the cement cured the wound was irrigated out with normal saline solution pulse lavage, and medium Hemovac drains were placed.. Ligament stability and patellar tracking were checked and found to be excellent. The tourniquet was then released and hemostasis was obtained with cautery. The parapatellar arthrotomy was closed with  #1 ethibond suture. The subcutaneous tissue with 0 and 2-0 undyed  Vicryl suture, and 4-0 Monocryl.. A dressing of Xeroform,  4 x 4, dressing sponges,  Webril, and Ace wrap applied. Needle and sponge count were correct times 2.The patient awakened, extubated, and taken to recovery room without difficulty. Vascular status was normal, pulses 2+ and symmetric.   Lorn Junes 07/07/2013, 8:57 AM

## 2013-07-07 NOTE — Anesthesia Procedure Notes (Addendum)
Anesthesia Regional Block:  Femoral nerve block  Pre-Anesthetic Checklist: ,, timeout performed, Correct Patient, Correct Site, Correct Laterality, Correct Procedure, Correct Position, site marked, Risks and benefits discussed,  Surgical consent,  Pre-op evaluation,  At surgeon's request and post-op pain management  Laterality: Right and Lower  Prep: chloraprep       Needles:  Injection technique: Single-shot  Needle Type: Echogenic Stimulator Needle      Needle Gauge: 22 and 22 G    Additional Needles:  Procedures: nerve stimulator Femoral nerve block  Nerve Stimulator or Paresthesia:  Response: patella twitch, 0.45 mA, 0.1 ms,   Additional Responses:   Narrative:  Start time: 07/07/2013 6:59 AM End time: 07/07/2013 7:05 AM Injection made incrementally with aspirations every 5 mL.  Performed by: Personally  Anesthesiologist: Jenita Seashore, MD  Additional Notes: Pt identified in Holding room.  Monitors applied. Working IV access confirmed. Sterile prep R groin.  #22ga PNS to patella twitch at 0.41mA threshold.  30cc 0.5% Bupivacaine with 1:200k epi injected incrementally after negative test dose.  Patient asymptomatic, VSS, no heme aspirated, tolerated well.  Jenita Seashore, MD   Procedure Name: LMA Insertion Date/Time: 07/07/2013 7:24 AM Performed by: Claybon Jabs R Pre-anesthesia Checklist: Patient identified, Emergency Drugs available, Suction available, Patient being monitored and Timeout performed Patient Re-evaluated:Patient Re-evaluated prior to inductionOxygen Delivery Method: Circle system utilized Preoxygenation: Pre-oxygenation with 100% oxygen Intubation Type: IV induction Ventilation: Mask ventilation without difficulty LMA Size: 5.0 Number of attempts: 1 Tube secured with: Tape Dental Injury: Teeth and Oropharynx as per pre-operative assessment

## 2013-07-07 NOTE — Anesthesia Postprocedure Evaluation (Signed)
  Anesthesia Post-op Note  Patient: Lee Owens  Procedure(s) Performed: Procedure(s): TOTAL KNEE ARTHROPLASTY- right (Right)  Patient Location: PACU  Anesthesia Type:GA combined with regional for post-op pain  Level of Consciousness: awake, alert , oriented and patient cooperative  Airway and Oxygen Therapy: Patient Spontanous Breathing and Patient connected to nasal cannula oxygen  Post-op Pain: mild  Post-op Assessment: Post-op Vital signs reviewed, Patient's Cardiovascular Status Stable, Respiratory Function Stable, Patent Airway, No signs of Nausea or vomiting and Pain level controlled  Post-op Vital Signs: Reviewed and stable  Last Vitals:  Filed Vitals:   07/07/13 1000  BP: 122/73  Pulse: 82  Temp:   Resp: 14    Complications: No apparent anesthesia complications

## 2013-07-07 NOTE — H&P (View-Only) (Signed)
TOTAL KNEE ADMISSION H&P  Patient is being admitted for right total knee arthroplasty.  Subjective:  Chief Complaint:right knee pain.  HPI: Lee Owens, 63 y.o. male, has a history of pain and functional disability in the right knee due to arthritis and has failed non-surgical conservative treatments for greater than 12 weeks to includeNSAID's and/or analgesics, corticosteriod injections, viscosupplementation injections, flexibility and strengthening excercises, supervised PT with diminished ADL's post treatment, weight reduction as appropriate and activity modification.  Onset of symptoms was gradual, starting 10 years ago with gradually worsening course since that time. The patient noted prior procedures on the knee to include  arthroscopy and menisectomy on the right knee(s).  Patient currently rates pain in the right knee(s) at 10 out of 10 with activity. Patient has night pain, worsening of pain with activity and weight bearing, pain that interferes with activities of daily living, crepitus and joint swelling.  Patient has evidence of subchondral sclerosis, periarticular osteophytes, joint subluxation and joint space narrowing by imaging studies.There is no active infection.  Patient Active Problem List   Diagnosis Date Noted  . Right knee DJD   . Left knee DJD 05/12/2013  . DJD (degenerative joint disease) of knee 05/12/2013  . Hyperlipidemia   . Hypertension   . Insomnia   . Anxiety   . Vitamin B12 deficiency   . BPH (benign prostatic hypertrophy)   . URI (upper respiratory infection) 07/04/2012  . Hyperglycemia 07/04/2012  . Constipation 04/03/2012  . Knee osteoarthritis 09/27/2011  . Well adult exam 09/27/2011  . Hypogonadism, male 11/30/2010  . Preop exam for internal medicine 11/30/2010  . Neoplasm of uncertain behavior of skin 04/28/2010  . CERUMEN IMPACTION 04/28/2010  . CHEST PAIN, RIGHT 01/22/2009  . TOBACCO USE, QUIT 01/22/2009  . VITAMIN D DEFICIENCY 11/29/2007  .  FIBROMYALGIA 11/29/2007  . ALLERGIC RHINITIS 08/29/2007  . HERNIA 08/29/2007  . Rosacea 08/29/2007  . SLEEP DISORDER 08/29/2007  . FATIGUE 08/29/2007  . PSA, INCREASED 08/29/2007   Past Medical History  Diagnosis Date  . Hyperlipidemia     takes Lovastatin daily  . Muscle pain     takes Baclofen daily  . Insomnia     takes Restoril nightly   . Chronic fatigue   . Allergic rhinitis   . Vitamin D deficiency disease   . Vitamin B12 deficiency 2009  . BPH (benign prostatic hypertrophy)     takes Proscar daily  . Hypertension     takes Lisinopril daily  . Night muscle spasms     takes Valium as needed  . Urinary frequency     takes rapaflo daily  . Pneumonia     at age 59  . Arthritis   . Joint pain   . Joint swelling   . Back pain     occasionally but reason unknown  . Hemorrhoids   . Diverticulosis   . Anxiety     takes Valium bid  . Depression     takes Cymbalta daily  . Right knee DJD     Past Surgical History  Procedure Laterality Date  . Prostate biopsy  11-11  . Knee arthroscopy Bilateral 2012  . Colonoscopy    . Total knee arthroplasty Left 05/12/2013    Procedure: TOTAL KNEE ARTHROPLASTY- left;  Surgeon: Lorn Junes, MD;  Location: [facility];  Service: Orthopedics;  Laterality: Left;     (Not in a hospital admission) No Known Allergies   Current Outpatient Prescriptions on File Prior to Visit  Medication Sig Dispense Refill  . aspirin 325 MG EC tablet Take 325 mg by mouth daily.      . baclofen (LIORESAL) 20 MG tablet Take 20 mg by mouth 3 (three) times daily.      . bisacodyl (DULCOLAX) 5 MG EC tablet Take 5 mg by mouth daily as needed for mild constipation or moderate constipation.      . celecoxib (CELEBREX) 200 MG capsule Take 1 capsule (200 mg total) by mouth daily.  30 capsule  3  . Cholecalciferol (VITAMIN D-3) 5000 UNITS TABS Take 5,000 Units by mouth daily.      . diazepam (VALIUM) 5 MG tablet Take 5 mg by mouth every 12 (twelve) hours as  needed for anxiety.       . docusate sodium (COLACE) 100 MG capsule Take 100 mg by mouth daily as needed for mild constipation.      . DULoxetine (CYMBALTA) 60 MG capsule Take 120 mg by mouth daily.       . fentaNYL (DURAGESIC - DOSED MCG/HR) 25 MCG/HR patch Place 25 mcg onto the skin every 3 (three) days.      . finasteride (PROSCAR) 5 MG tablet Take 5 mg by mouth daily.      Marland Kitchen lisinopril (PRINIVIL,ZESTRIL) 5 MG tablet Take 5 mg by mouth daily.      Marland Kitchen LORazepam (ATIVAN) 1 MG tablet Take 1 tablet (1 mg total) by mouth every 8 (eight) hours as needed for anxiety.  60 tablet  0  . lovastatin (MEVACOR) 40 MG tablet Take 40 mg by mouth at bedtime.      Marland Kitchen oxyCODONE (OXY IR/ROXICODONE) 5 MG immediate release tablet Take 5 mg by mouth every 4 (four) hours as needed for moderate pain or severe pain.      . Probiotic Product (ALIGN PO) Take 1 capsule by mouth daily.        . silodosin (RAPAFLO) 8 MG CAPS capsule Take 8 mg by mouth as needed (to relieve urinary symptoms).       . temazepam (RESTORIL) 30 MG capsule Take 30-60 mg by mouth at bedtime as needed for sleep.       . vitamin B-12 (CYANOCOBALAMIN) 1000 MCG tablet Take 2,000 mcg by mouth daily.        No current facility-administered medications on file prior to visit.   History  Substance Use Topics  . Smoking status: Former Research scientist (life sciences)  . Smokeless tobacco: Not on file     Comment: quit smoking in 7 1/61yrs ago  . Alcohol Use: Yes     Comment: daily    Family History  Problem Relation Age of Onset  . Hypertension Other   . Coronary artery disease Other      Review of Systems  Constitutional: Negative.   HENT: Negative.   Eyes: Negative.   Respiratory: Negative.   Cardiovascular: Negative.   Gastrointestinal: Negative.   Genitourinary: Negative.   Musculoskeletal: Positive for back pain and joint pain.  Skin: Negative.   Neurological: Negative.   Endo/Heme/Allergies: Negative.   Psychiatric/Behavioral: Positive for depression.  Negative for suicidal ideas, hallucinations, memory loss and substance abuse. The patient is nervous/anxious. The patient does not have insomnia.     Objective:  Physical Exam  Constitutional: He is oriented to person, place, and time. He appears well-developed and well-nourished.  HENT:  Head: Normocephalic and atraumatic.  Mouth/Throat: Oropharynx is clear and moist.  Eyes: Conjunctivae and EOM are normal. Pupils are equal, round, and reactive  to light.  Cardiovascular: Normal rate and regular rhythm.   Respiratory: Effort normal and breath sounds normal.  GI: Soft. Bowel sounds are normal.  Musculoskeletal:  Right knee has ROM 0-120 with significant varus deformity. 2+crepititus.  2+synovitis. Left knee has ROM 0-120 well healed total knee replacement scar. 2+DP  Neurological: He is alert and oriented to person, place, and time.  Skin: Skin is warm and dry.  Psychiatric: He has a normal mood and affect. His behavior is normal.    Vital signs in last 24 hours: Last recorded: 04/01 1300   BP: 125/82    Temp: 98.2 F (36.8 C)    Height: 6' (1.829 m) SpO2: 97  Weight: 92.534 kg (204 lb)     Labs:   Estimated body mass index is 27.66 kg/(m^2) as calculated from the following:   Height as of this encounter: 6' (1.829 m).   Weight as of this encounter: 92.534 kg (204 lb).   Imaging Review Plain radiographs demonstrate severe degenerative joint disease of the right knee(s). The overall alignment issignificant varus. The bone quality appears to be good for age and reported activity level.  Assessment/Plan:  End stage arthritis, right knee   The patient history, physical examination, clinical judgment of the provider and imaging studies are consistent with end stage degenerative joint disease of the right knee(s) and total knee arthroplasty is deemed medically necessary. The treatment options including medical management, injection therapy arthroscopy and arthroplasty were  discussed at length. The risks and benefits of total knee arthroplasty were presented and reviewed. The risks due to aseptic loosening, infection, stiffness, patella tracking problems, thromboembolic complications and other imponderables were discussed. The patient acknowledged the explanation, agreed to proceed with the plan and consent was signed. Patient is being admitted for inpatient treatment for surgery, pain control, PT, OT, prophylactic antibiotics, VTE prophylaxis, progressive ambulation and ADL's and discharge planning. The patient is planning to be discharged home with home health services  Akiah Bauch A. Kaleen Mask Physician Assistant [facility] [phone]  06/25/2013, 2:42 PM

## 2013-07-07 NOTE — Interval H&P Note (Signed)
History and Physical Interval Note:  07/07/2013 7:08 AM  Lee Owens  has presented today for surgery, with the diagnosis of djd right knee  The various methods of treatment have been discussed with the patient and family. After consideration of risks, benefits and other options for treatment, the patient has consented to  Procedure(s): TOTAL KNEE ARTHROPLASTY (Right) as a surgical intervention .  The patient's history has been reviewed, patient examined, no change in status, stable for surgery.  I have reviewed the patient's chart and labs.  Questions were answered to the patient's satisfaction.     Lorn Junes

## 2013-07-07 NOTE — Progress Notes (Signed)
Orthopedic Tech Progress Note Patient Details:  Lee Owens 11/11/1950 [id number] CPM applied to Right LE with appropriate settings. OHF applied to bed. Footsie roll provided.  CPM Right Knee CPM Right Knee: On Right Knee Flexion (Degrees): 60 Right Knee Extension (Degrees): 0   Asia R Thompson 07/07/2013, 10:16 AM

## 2013-07-07 NOTE — Anesthesia Preprocedure Evaluation (Addendum)
Anesthesia Evaluation  Patient identified by MRN, date of birth, ID band Patient awake    Reviewed: Allergy & Precautions, H&P , NPO status , Patient's Chart, lab work & pertinent test results, reviewed documented beta blocker date and time   History of Anesthesia Complications Negative for: history of anesthetic complications  Airway Mallampati: II TM Distance: >3 FB Neck ROM: Full    Dental  (+) Teeth Intact, Dental Advisory Given, Implants, Caps   Pulmonary COPDformer smoker,  breath sounds clear to auscultation  Pulmonary exam normal       Cardiovascular hypertension, Pt. on medications Rhythm:Regular Rate:Normal     Neuro/Psych negative neurological ROS     GI/Hepatic Neg liver ROS, GERD-  Controlled,  Endo/Other  negative endocrine ROS  Renal/GU negative Renal ROS     Musculoskeletal  (+) Fibromyalgia - (duragesic patch), narcotic dependent  Abdominal   Peds  Hematology negative hematology ROS (+)   Anesthesia Other Findings   Reproductive/Obstetrics                        Anesthesia Physical Anesthesia Plan  ASA: III  Anesthesia Plan: General   Post-op Pain Management:    Induction: Intravenous  Airway Management Planned: LMA  Additional Equipment: None  Intra-op Plan:   Post-operative Plan: Extubation in OR  Informed Consent: I have reviewed the patients History and Physical, chart, labs and discussed the procedure including the risks, benefits and alternatives for the proposed anesthesia with the patient or authorized representative who has indicated his/her understanding and acceptance.   Dental advisory given  Plan Discussed with: CRNA, Anesthesiologist and Surgeon  Anesthesia Plan Comments: (Plan routine monitors, GA- LMA OK, femoral nerve block for post op analgesia)       Anesthesia Quick Evaluation

## 2013-07-07 NOTE — Evaluation (Signed)
Physical Therapy Evaluation Patient Details Name: Lee Owens MRN: [id number] DOB: 1951/01/10 Today's Date: 07/07/2013   History of Present Illness  R TKA  Clinical Impression  Pt is s/p TKA resulting in the deficits listed below (see PT Problem List).  Pt will benefit from skilled PT to increase their independence and safety with mobility to allow discharge to the venue listed below.      Follow Up Recommendations Home health PT;Supervision/Assistance - 24 hour    Equipment Recommendations  None recommended by PT    Recommendations for Other Services OT consult     Precautions / Restrictions Precautions Precautions: Knee Required Braces or Orthoses: Knee Immobilizer - Right Knee Immobilizer - Right: On when out of bed or walking;Discontinue post op day 2 Restrictions Weight Bearing Restrictions: Yes RLE Weight Bearing: Weight bearing as tolerated      Mobility  Bed Mobility Overal bed mobility: Needs Assistance Bed Mobility: Sit to Supine;Supine to Sit     Supine to sit: Min guard Sit to supine: Min guard   General bed mobility comments: Cues for technique  Transfers Overall transfer level: Needs assistance Equipment used: Rolling walker (2 wheeled) Transfers: Sit to/from Stand Sit to Stand: Min guard         General transfer comment: Cues for safety, hand placement, and technique  Ambulation/Gait Ambulation/Gait assistance: Min guard Ambulation Distance (Feet): 300 Feet Assistive device: Rolling walker (2 wheeled) Gait Pattern/deviations: Step-through pattern     General Gait Details: Cues for gait sequence, and to activate R quad for stance stability; naturally progressing to step-through pattern; nice, stable knee in stance; anticipate good progress  Stairs            Wheelchair Mobility    Modified Rankin (Stroke Patients Only)       Balance                                             Pertinent Vitals/Pain 7/10  pain R knee post session patient repositioned for comfort and Optimal knee extension     Home Living Family/patient expects to be discharged to:: Private residence Living Arrangements: Spouse/significant other Available Help at Discharge: Family;Available 24 hours/day Type of Home: House Home Access: Level entry (small threshold)     Home Layout: One level Home Equipment: Walker - 2 wheels;Cane - single point;Bedside commode;Tub bench      Prior Function Level of Independence: Independent         Comments: >5 falls in past year     Hand Dominance   Dominant Hand: Right    Extremity/Trunk Assessment   Upper Extremity Assessment: Overall WFL for tasks assessed           Lower Extremity Assessment: RLE deficits/detail RLE Deficits / Details: Overall the knee is movingquite well; showing good control of knee against gravity with minimal quad lag       Communication   Communication: No difficulties  Cognition Arousal/Alertness: Awake/alert Behavior During Therapy: WFL for tasks assessed/performed Overall Cognitive Status: Within Functional Limits for tasks assessed                      General Comments      Exercises Total Joint Exercises Quad Sets: AROM;Right;5 reps Heel Slides: AROM;Right;5 reps Straight Leg Raises: AROM;Right;5 reps Goniometric ROM: 2-80deg      Assessment/Plan  PT Assessment Patient needs continued PT services  PT Diagnosis Difficulty walking;Acute pain   PT Problem List Decreased strength;Decreased range of motion;Decreased activity tolerance;Decreased balance;Decreased mobility;Decreased knowledge of use of DME;Pain  PT Treatment Interventions DME instruction;Gait training;Stair training;Functional mobility training;Therapeutic activities;Therapeutic exercise;Balance training;Patient/family education   PT Goals (Current goals can be found in the Care Plan section) Acute Rehab PT Goals Patient Stated Goal: be able to do  his job without pain PT Goal Formulation: With patient Time For Goal Achievement: 07/14/13 Potential to Achieve Goals: Good    Frequency 7X/week   Barriers to discharge        Co-evaluation               End of Session Equipment Utilized During Treatment: Right knee immobilizer Activity Tolerance: Patient tolerated treatment well Patient left: in chair;with call bell/phone within reach;with family/visitor present Nurse Communication: Mobility status         Time: 4166-0630 PT Time Calculation (min): 38 min   Charges:   PT Evaluation $Initial PT Evaluation Tier I: 1 Procedure PT Treatments $Gait Training: 8-22 mins $Therapeutic Exercise: 8-22 mins   PT G Codes:          [facility] [place] 07/07/2013, 8:04 PM Roney Marion, PT  Acute Rehabilitation Services Pager [phone] Office [phone]

## 2013-07-07 NOTE — Care Management Note (Signed)
CARE MANAGEMENT NOTE 07/07/2013  Patient:  Lee Owens, Lee Owens   Account Number:  [id number]  Date Initiated:  07/07/2013  Documentation initiated by:  Ricki Miller  Subjective/Objective Assessment:   63 yr old male s/p right total knee arthroplasty.     Action/Plan:   Patient preoperatively setup with [facility], no changes. Has DME from previous surgeryPreoperatively setup with [facility], no changes..Patient and wife requesting same therapist.   Anticipated DC Date:  07/08/2013   Anticipated DC Plan:  [place]  CM consult      [place]   Choice offered to / List presented to:  C-3 Spouse   DME arranged  CPM      DME agency  [employer]     [place] arranged  HH-2 PT      [place]   Status of service:  Completed, signed off Medicare Important Message given?   (If response is "NO", the following Medicare IM given date fields will be blank) Date Medicare IM given:   Date Additional Medicare IM given:    Discharge Disposition:  [place]  Per UR Regulation:

## 2013-07-08 ENCOUNTER — Encounter (HOSPITAL_COMMUNITY): Payer: Self-pay | Admitting: Orthopedic Surgery

## 2013-07-08 LAB — BASIC METABOLIC PANEL
BUN: 15 mg/dL (ref 6–23)
CO2: 23 mEq/L (ref 19–32)
Calcium: 9 mg/dL (ref 8.4–10.5)
Chloride: 103 mEq/L (ref 96–112)
Creatinine, Ser: 0.65 mg/dL (ref 0.50–1.35)
GFR calc Af Amer: >90 mL/min (ref >90)
GFR calc non Af Amer: >90 mL/min (ref >90)
GLUCOSE: 160 mg/dL — AB (ref 70–99)
POTASSIUM: 4.9 meq/L (ref 3.7–5.3)
SODIUM: 139 meq/L (ref 137–147)

## 2013-07-08 LAB — CBC
HCT: 32.3 % — ABNORMAL LOW (ref 39.0–52.0)
HEMOGLOBIN: 11.1 g/dL — AB (ref 13.0–17.0)
MCH: 31 pg (ref 26.0–34.0)
MCHC: 34.4 g/dL (ref 30.0–36.0)
MCV: 90.2 fL (ref 78.0–100.0)
Platelets: 216 10*3/uL (ref 150–400)
RBC: 3.58 MIL/uL — ABNORMAL LOW (ref 4.22–5.81)
RDW: 12.9 % (ref 11.5–15.5)
WBC: 12.2 10*3/uL — ABNORMAL HIGH (ref 4.0–10.5)

## 2013-07-08 MED ORDER — BISACODYL 5 MG PO TBEC: DELAYED_RELEASE_TABLET | ORAL | Status: DC

## 2013-07-08 MED ORDER — ASPIRIN 325 MG PO TBEC: DELAYED_RELEASE_TABLET | ORAL | Status: DC

## 2013-07-08 MED ORDER — FENTANYL 50 MCG/HR TD PT72: 50.0000 ug | MEDICATED_PATCH | TRANSDERMAL | Status: DC

## 2013-07-08 MED ORDER — ACETAMINOPHEN 325 MG PO TABS: 650.0000 mg | ORAL_TABLET | Freq: Four times a day (QID) | ORAL | Status: DC | PRN

## 2013-07-08 MED ORDER — OXYCODONE HCL 5 MG PO TABS: ORAL_TABLET | ORAL | Status: DC

## 2013-07-08 NOTE — Progress Notes (Signed)
Pt discharged to home. D/c instructions given, no questions verbalized. Vitals stable. 

## 2013-07-08 NOTE — Progress Notes (Signed)
Seen and agreed 07/08/2013 Lee Owens PTA [phone] pager [phone] office    

## 2013-07-08 NOTE — Progress Notes (Signed)
OT Cancellation Note  Patient Details Name: Orvile Corona MRN: [id number] DOB: Aug 18, 1950   Cancelled Treatment:    Reason Eval/Treat Not Completed: OT screened, no needs identified, will sign off Pt and spouse state they have equipment and pt has had prior TKA.   Hortencia Pilar 07/08/2013, 9:04 AM

## 2013-07-08 NOTE — Progress Notes (Signed)
Physical Therapy Treatment Patient Details Name: Lee Owens MRN: [id number] DOB: 04-Nov-1950 Today's Date: 07/08/2013    History of Present Illness R TKA    PT Comments    Patient progressing towards goals. Prior to therapy spoke with nurse in regards to immobilizer. Verbal confirmation d/c immobilizer with all activity. Patient able to tolerate therapy. Patient requires cues for proper technique. Continue to recommend HHPT at this time to return to functional independence.   Follow Up Recommendations  Home health PT;Supervision/Assistance - 24 hour     Equipment Recommendations  None recommended by PT    Recommendations for Other Services       Precautions / Restrictions Precautions Precautions: Knee Restrictions Weight Bearing Restrictions: Yes RLE Weight Bearing: Weight bearing as tolerated    Mobility  Bed Mobility               General bed mobility comments: Patient in recliner prior to therapy.  Transfers Overall transfer level: Needs assistance Equipment used: Rolling walker (2 wheeled) Transfers: Sit to/from Stand Sit to Stand: Min guard         General transfer comment: Patient required cues for hand placement with stand to sit.  Ambulation/Gait Ambulation/Gait assistance: Min guard Ambulation Distance (Feet): 150 Feet Assistive device: Rolling walker (2 wheeled) Gait Pattern/deviations: Step-through pattern     General Gait Details: Patient required cues for proper foot mechanics with gait.    Stairs Stairs: Yes Stairs assistance: Min guard Stair Management: No rails;Backwards;Forwards;With walker Number of Stairs: 1 General stair comments: Patient able to ascend and descend step with cues for technique. Step x 2  Wheelchair Mobility    Modified Rankin (Stroke Patients Only)       Balance                                    Cognition Arousal/Alertness: Awake/alert Behavior During Therapy: WFL for tasks  assessed/performed Overall Cognitive Status: Within Functional Limits for tasks assessed                      Exercises Total Joint Exercises Ankle Circles/Pumps: AROM;Right;5 reps;Seated Quad Sets: AROM;Seated;Right;10 reps Heel Slides: AROM;Seated;Right;5 reps Hip ABduction/ADduction: Seated;AROM;Right;5 reps Straight Leg Raises: AROM;Seated;Right;10 reps Long Arc Quad: AROM;Seated;Right;15 reps    General Comments        Pertinent Vitals/Pain Patient reports 6/10 pain level in right knee. As per patient, nursing staff will be in shortly to adm IV for pain medicine.    Home Living                      Prior Function            PT Goals (current goals can now be found in the care plan section) Progress towards PT goals: Progressing toward goals    Frequency  7X/week    PT Plan Current plan remains appropriate    Co-evaluation             End of Session Equipment Utilized During Treatment: Gait belt Activity Tolerance: Patient tolerated treatment well Patient left: in chair;with call bell/phone within reach;with family/visitor present     Time: 9485-4627 PT Time Calculation (min): 29 min  Charges:                       G Codes:      [place], SPTA  07/08/2013, 8:43 AM

## 2013-07-08 NOTE — Plan of Care (Signed)
Problem: Consults Goal: Diagnosis- Total Joint Replacement Primary Total Knee Right     

## 2013-07-08 NOTE — Discharge Summary (Signed)
Patient ID: Lee Owens MRN: [id number] DOB/AGE: Jul 06, 1950 63 y.o.  Admit date: 07/07/2013 Discharge date: 07/08/2013  Admission Diagnoses:  Principal Problem:   Right knee DJD Active Problems:   VITAMIN D DEFICIENCY   FIBROMYALGIA   SLEEP DISORDER   PSA, INCREASED   Knee osteoarthritis   DJD (degenerative joint disease) of knee   Status post total left knee replacement   Discharge Diagnoses:  Same  Past Medical History  Diagnosis Date  . Hyperlipidemia     takes Lovastatin daily  . Muscle pain     takes Baclofen daily  . Insomnia     takes Restoril nightly   . Chronic fatigue   . Allergic rhinitis   . Vitamin D deficiency disease   . Vitamin B12 deficiency 2009  . BPH (benign prostatic hypertrophy)     takes Proscar daily  . Hypertension     takes Lisinopril daily  . Night muscle spasms     takes Valium as needed  . Urinary frequency     takes rapaflo daily  . Joint pain   . Joint swelling   . Back pain     occasionally but reason unknown  . Hemorrhoids   . Diverticulosis   . Anxiety     takes Valium bid  . Depression     takes Cymbalta daily  . Pneumonia 1971  . Arthritis     "hips, knees, hands" (07/07/2013)  . Right knee DJD     Surgeries: Procedure(s): TOTAL KNEE ARTHROPLASTY- right on 07/07/2013   Consultants:    Discharged Condition: Improved  Hospital Course: Lee Owens is an 63 y.o. male who was admitted 07/07/2013 for operative treatment ofRight knee DJD. Patient has severe unremitting pain that affects sleep, daily activities, and work/hobbies. After pre-op clearance the patient was taken to the operating room on 07/07/2013 and underwent  Procedure(s): TOTAL KNEE ARTHROPLASTY- right.    Patient was given perioperative antibiotics: Anti-infectives   Start     Dose/Rate Route Frequency Ordered Stop   07/07/13 1300  ceFAZolin (ANCEF) IVPB 2 g/50 mL premix     2 g 100 mL/hr over 30 Minutes Intravenous Every 6 hours 07/07/13 1141  07/07/13 1912   07/07/13 0600  ceFAZolin (ANCEF) IVPB 2 g/50 mL premix     2 g 100 mL/hr over 30 Minutes Intravenous On call to O.R. 07/06/13 1337 07/07/13 0713       Patient was given sequential compression devices, early ambulation, and chemoprophylaxis to prevent DVT.  Patient benefited maximally from hospital stay and there were no complications.    Recent vital signs: Patient Vitals for the past 24 hrs:  BP Temp Temp src Pulse Resp SpO2  07/08/13 0549 99/54 mmHg 98.4 F (36.9 C) Oral 78 18 98 %  07/08/13 0000 - - - - 18 98 %  07/07/13 2122 104/61 mmHg 98 F (36.7 C) Oral 86 18 98 %  07/07/13 2000 - - - - 18 98 %  07/07/13 1146 130/88 mmHg 97.7 F (36.5 C) Oral 77 18 97 %  07/07/13 1130 125/66 mmHg 97.8 F (36.6 C) - 78 22 96 %  07/07/13 1115 125/75 mmHg - - 77 11 95 %  07/07/13 1100 132/74 mmHg - - 81 18 94 %  07/07/13 1045 128/77 mmHg - - 79 20 95 %  07/07/13 1030 112/75 mmHg - - 86 18 94 %  07/07/13 1015 128/76 mmHg - - 82 14 95 %  07/07/13 1000 122/73 mmHg - -  82 14 95 %  07/07/13 0945 123/68 mmHg - - 86 15 93 %  07/07/13 0935 - - - 87 17 93 %  07/07/13 0933 116/78 mmHg - - 85 16 95 %  07/07/13 0930 - 97.4 F (36.3 C) - 87 22 92 %     Recent laboratory studies:  Recent Labs  07/08/13 0408  WBC 12.2*  HGB 11.1*  HCT 32.3*  PLT 216  NA 139  K 4.9  CL 103  CO2 23  BUN 15  CREATININE 0.65  GLUCOSE 160*  CALCIUM 9.0     Discharge Medications:     Medication List    STOP taking these medications       fentaNYL 25 MCG/HR patch  Commonly known as:  DURAGESIC - dosed mcg/hr  Replaced by:  fentaNYL 50 MCG/HR      TAKE these medications       acetaminophen 325 MG tablet  Commonly known as:  TYLENOL  Take 2 tablets (650 mg total) by mouth every 6 (six) hours as needed for mild pain (or Fever >/= 101).     ALIGN PO  Take 1 capsule by mouth daily.     aspirin 325 MG EC tablet  1 tab a day for the next 30 days to prevent blood clots     baclofen  20 MG tablet  Commonly known as:  LIORESAL  Take 20 mg by mouth 3 (three) times daily.     bisacodyl 5 MG EC tablet  Commonly known as:  DULCOLAX  Take 2 tablets every night with dinner until bowel movement.  LAXITIVE.  Restart if two days since last bowel movement     celecoxib 200 MG capsule  Commonly known as:  CELEBREX  Take 1 capsule (200 mg total) by mouth daily.     diazepam 5 MG tablet  Commonly known as:  VALIUM  Take 5 mg by mouth every 12 (twelve) hours as needed for anxiety.     docusate sodium 100 MG capsule  Commonly known as:  COLACE  Take 100 mg by mouth daily as needed for mild constipation.     DULoxetine 60 MG capsule  Commonly known as:  CYMBALTA  Take 120 mg by mouth daily.     fentaNYL 50 MCG/HR  Commonly known as:  DURAGESIC - dosed mcg/hr  Place 1 patch (50 mcg total) onto the skin every 3 (three) days.     finasteride 5 MG tablet  Commonly known as:  PROSCAR  Take 5 mg by mouth daily.     lisinopril 5 MG tablet  Commonly known as:  PRINIVIL,ZESTRIL  Take 5 mg by mouth daily.     LORazepam 1 MG tablet  Commonly known as:  ATIVAN  Take 1 tablet (1 mg total) by mouth every 8 (eight) hours as needed for anxiety.     lovastatin 40 MG tablet  Commonly known as:  MEVACOR  Take 40 mg by mouth at bedtime.     oxyCODONE 5 MG immediate release tablet  Commonly known as:  Oxy IR/ROXICODONE  1-2 tablets every 4-6 hrs as needed for pain     RESTORIL 30 MG capsule  Generic drug:  temazepam  Take 30-60 mg by mouth at bedtime as needed for sleep.     silodosin 8 MG Caps capsule  Commonly known as:  RAPAFLO  Take 8 mg by mouth as needed (to relieve urinary symptoms).     vitamin B-12 1000 MCG tablet  Commonly known as:  CYANOCOBALAMIN  Take 2,000 mcg by mouth daily.     Vitamin D-3 5000 UNITS Tabs  Take 5,000 Units by mouth daily.        Diagnostic Studies: No results found.  Disposition: 06-Home-Health Care Svc      Discharge Orders    Future Appointments Provider Department Dept Phone   09/30/2013 1:15 PM Cassandria Anger, MD [place] [phone]   Future Orders Complete By Expires   Call MD / Call 911  As directed    Change dressing  As directed    Constipation Prevention  As directed    CPM  As directed    Diet - low sodium heart healthy  As directed    Discharge instructions  As directed    Do not put a pillow under the knee. Place it under the heel.  As directed    Increase activity slowly as tolerated  As directed    TED hose  As directed       Follow-up Information   Follow up with [facility]. (Someone from [facility] will contact you concerning start date and time for physical therapy.)    Contact information:   [place] [address] [phone]       Follow up with Lorn Junes, MD On 07/21/2013. (appt time 2:15pm)    Specialty:  Orthopedic Surgery   Contact information:   [place] [address] [phone]        Signed: Linda Hedges 07/08/2013, 7:52 AM

## 2013-07-25 ENCOUNTER — Other Ambulatory Visit: Payer: Self-pay | Admitting: Internal Medicine

## 2013-08-14 ENCOUNTER — Other Ambulatory Visit: Payer: Self-pay | Admitting: Internal Medicine

## 2013-08-18 ENCOUNTER — Other Ambulatory Visit: Payer: Self-pay | Admitting: Internal Medicine

## 2013-09-30 ENCOUNTER — Ambulatory Visit (INDEPENDENT_AMBULATORY_CARE_PROVIDER_SITE_OTHER): Payer: BC Managed Care – PPO | Admitting: Internal Medicine

## 2013-09-30 ENCOUNTER — Encounter: Payer: Self-pay | Admitting: Internal Medicine

## 2013-09-30 VITALS — BP 130/84 | HR 76 | Temp 98.6°F | Resp 16 | Wt 206.0 lb

## 2013-09-30 DIAGNOSIS — I10 Essential (primary) hypertension: Secondary | ICD-10-CM

## 2013-09-30 DIAGNOSIS — M170 Bilateral primary osteoarthritis of knee: Secondary | ICD-10-CM

## 2013-09-30 DIAGNOSIS — IMO0001 Reserved for inherently not codable concepts without codable children: Secondary | ICD-10-CM

## 2013-09-30 DIAGNOSIS — M1710 Unilateral primary osteoarthritis, unspecified knee: Secondary | ICD-10-CM

## 2013-09-30 DIAGNOSIS — E538 Deficiency of other specified B group vitamins: Secondary | ICD-10-CM

## 2013-09-30 DIAGNOSIS — E559 Vitamin D deficiency, unspecified: Secondary | ICD-10-CM

## 2013-09-30 MED ORDER — OXYCODONE HCL 5 MG PO TABS: ORAL_TABLET | ORAL | Status: DC

## 2013-09-30 MED ORDER — FENTANYL 25 MCG/HR TD PT72: 25.0000 ug | MEDICATED_PATCH | TRANSDERMAL | Status: DC

## 2013-09-30 NOTE — Assessment & Plan Note (Signed)
Continue with current prescription therapy as reflected on the Med list.  

## 2013-09-30 NOTE — Assessment & Plan Note (Signed)
7/15 Duragesic would last x 2 d only - will use 25 mcg q 2 d

## 2013-09-30 NOTE — Assessment & Plan Note (Signed)
S/p B TKR 

## 2013-09-30 NOTE — Progress Notes (Signed)
Pre visit review using our clinic review tool, if applicable. No additional management support is needed unless otherwise documented below in the visit note. 

## 2013-09-30 NOTE — Progress Notes (Signed)
   Subjective:    HPI  F/u knee B pain - B TKR. Getting  R TKR on 4/15 by Dr Noemi Chapel next wk  The patient is here to follow up on chronic HTN, dyslipidemia, insomnia and chronic moderate spastic fibromyalgia/CFS symptoms controlled some with medicines.   FMS is 80% better on Fentanyl/Tramadol    F/u cramps  Wt Readings from Last 3 Encounters:  09/30/13 206 lb (93.441 kg)  07/08/13 204 lb (92.534 kg)  07/08/13 204 lb (92.534 kg)   BP Readings from Last 3 Encounters:  09/30/13 130/84  07/08/13 99/54  07/08/13 99/54      Review of Systems  Constitutional: Positive for fatigue. Negative for appetite change and unexpected weight change.  HENT: Negative for congestion, nosebleeds, sneezing, sore throat and trouble swallowing.   Eyes: Negative for itching and visual disturbance.  Respiratory: Negative for cough.   Cardiovascular: Negative for chest pain, palpitations and leg swelling.  Gastrointestinal: Negative for nausea, diarrhea, blood in stool and abdominal distention.  Genitourinary: Negative for frequency and hematuria.  Musculoskeletal: Positive for arthralgias, back pain and myalgias. Negative for gait problem, joint swelling and neck pain.  Skin: Negative for rash.  Neurological: Negative for dizziness, tremors, speech difficulty and weakness.  Psychiatric/Behavioral: Positive for sleep disturbance. Negative for suicidal ideas, dysphoric mood and agitation. The patient is not nervous/anxious.        Objective:   Physical Exam  Constitutional: He is oriented to person, place, and time. He appears well-developed. No distress.  obese  HENT:  Mouth/Throat: Oropharynx is clear and moist.  Eyes: Conjunctivae are normal. Pupils are equal, round, and reactive to light.  Neck: Normal range of motion. No JVD present. No thyromegaly present.  Cardiovascular: Normal rate, regular rhythm, normal heart sounds and intact distal pulses.  Exam reveals no gallop and no friction  rub.   No murmur heard. Pulmonary/Chest: Effort normal and breath sounds normal. No respiratory distress. He has no wheezes. He has no rales. He exhibits no tenderness.  Abdominal: Soft. Bowel sounds are normal. He exhibits no distension and no mass. There is no tenderness. There is no rebound and no guarding.  Genitourinary:  Refused - def to Urol  Musculoskeletal: He exhibits edema and tenderness.  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  Neurological: He is alert and oriented to person, place, and time. He has normal reflexes. No cranial nerve deficit. He exhibits normal muscle tone. Coordination normal.  Skin: Skin is warm and dry. No rash noted. No erythema.  Psychiatric: He has a normal mood and affect. His behavior is normal. Judgment and thought content normal.  Limping less  Lab Results  Component Value Date   WBC 12.2* 07/08/2013   HGB 11.1* 07/08/2013   HCT 32.3* 07/08/2013   PLT 216 07/08/2013   GLUCOSE 160* 07/08/2013   CHOL 179 09/24/2012   TRIG 138.0 09/24/2012   HDL 53.30 09/24/2012   LDLCALC 98 09/24/2012   ALT 17 06/30/2013   AST 16 06/30/2013   NA 139 07/08/2013   K 4.9 07/08/2013   CL 103 07/08/2013   CREATININE 0.65 07/08/2013   BUN 15 07/08/2013   CO2 23 07/08/2013   TSH 1.65 09/24/2012   PSA 1.23 09/24/2012   INR 0.96 06/30/2013   HGBA1C 5.6 01/15/2009          Assessment & Plan:

## 2013-11-08 ENCOUNTER — Other Ambulatory Visit: Payer: Self-pay | Admitting: Internal Medicine

## 2013-12-30 ENCOUNTER — Ambulatory Visit (INDEPENDENT_AMBULATORY_CARE_PROVIDER_SITE_OTHER): Payer: BC Managed Care – PPO | Admitting: Internal Medicine

## 2013-12-30 ENCOUNTER — Encounter: Payer: Self-pay | Admitting: Internal Medicine

## 2013-12-30 VITALS — BP 110/84 | HR 77 | Temp 98.0°F | Wt 200.0 lb

## 2013-12-30 DIAGNOSIS — Z23 Encounter for immunization: Secondary | ICD-10-CM

## 2013-12-30 DIAGNOSIS — F411 Generalized anxiety disorder: Secondary | ICD-10-CM

## 2013-12-30 DIAGNOSIS — M791 Myalgia: Secondary | ICD-10-CM

## 2013-12-30 DIAGNOSIS — M609 Myositis, unspecified: Secondary | ICD-10-CM

## 2013-12-30 DIAGNOSIS — I10 Essential (primary) hypertension: Secondary | ICD-10-CM

## 2013-12-30 DIAGNOSIS — IMO0001 Reserved for inherently not codable concepts without codable children: Secondary | ICD-10-CM

## 2013-12-30 DIAGNOSIS — M170 Bilateral primary osteoarthritis of knee: Secondary | ICD-10-CM

## 2013-12-30 MED ORDER — FENTANYL 25 MCG/HR TD PT72: 25.0000 ug | MEDICATED_PATCH | TRANSDERMAL | Status: DC

## 2013-12-30 MED ORDER — LOSARTAN POTASSIUM 100 MG PO TABS: 100.0000 mg | ORAL_TABLET | Freq: Every day | ORAL | Status: DC

## 2013-12-30 MED ORDER — OXYCODONE HCL 5 MG PO TABS: ORAL_TABLET | ORAL | Status: DC

## 2013-12-30 NOTE — Progress Notes (Signed)
Subjective:    HPI  C/o R nipple discomfort x 1 month He fell 2 times C/o cough x 2-3 weeks  F/u knee B pain - B TKR. Getting  R TKR on 4/15 by Dr Noemi Chapel next wk  The patient is here to follow up on chronic HTN, dyslipidemia, insomnia and chronic moderate spastic fibromyalgia/CFS symptoms controlled some with medicines.   FMS is 80% better on Fentanyl/Tramadol    F/u cramps  Wt Readings from Last 3 Encounters:  12/30/13 200 lb (90.719 kg)  09/30/13 206 lb (93.441 kg)  07/08/13 204 lb (92.534 kg)   BP Readings from Last 3 Encounters:  12/30/13 110/84  09/30/13 130/84  07/08/13 99/54      Review of Systems  Constitutional: Positive for fatigue. Negative for appetite change and unexpected weight change.  HENT: Negative for congestion, nosebleeds, sneezing, sore throat and trouble swallowing.   Eyes: Negative for itching and visual disturbance.  Respiratory: Negative for cough.   Cardiovascular: Negative for chest pain, palpitations and leg swelling.  Gastrointestinal: Negative for nausea, diarrhea, blood in stool and abdominal distention.  Genitourinary: Negative for frequency and hematuria.  Musculoskeletal: Positive for arthralgias, back pain and myalgias. Negative for gait problem, joint swelling and neck pain.  Skin: Negative for rash.  Neurological: Negative for dizziness, tremors, speech difficulty and weakness.  Psychiatric/Behavioral: Positive for sleep disturbance. Negative for suicidal ideas, dysphoric mood and agitation. The patient is not nervous/anxious.        Objective:   Physical Exam  Constitutional: He is oriented to person, place, and time. He appears well-developed. No distress.  obese  HENT:  Mouth/Throat: Oropharynx is clear and moist.  Eyes: Conjunctivae are normal. Pupils are equal, round, and reactive to light.  Neck: Normal range of motion. No JVD present. No thyromegaly present.  Cardiovascular: Normal rate, regular rhythm, normal  heart sounds and intact distal pulses.  Exam reveals no gallop and no friction rub.   No murmur heard. Pulmonary/Chest: Effort normal and breath sounds normal. No respiratory distress. He has no wheezes. He has no rales. He exhibits no tenderness.  Abdominal: Soft. Bowel sounds are normal. He exhibits no distension and no mass. There is no tenderness. There is no rebound and no guarding.  Genitourinary:  Refused - def to Urol  Musculoskeletal: He exhibits edema and tenderness.  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  Neurological: He is alert and oriented to person, place, and time. He has normal reflexes. No cranial nerve deficit. He exhibits normal muscle tone. Coordination normal.  Skin: Skin is warm and dry. No rash noted. No erythema.  Psychiatric: He has a normal mood and affect. His behavior is normal. Judgment and thought content normal.  Limping  R breast tissue is generous   Lab Results  Component Value Date   WBC 12.2* 07/08/2013   HGB 11.1* 07/08/2013   HCT 32.3* 07/08/2013   PLT 216 07/08/2013   GLUCOSE 160* 07/08/2013   CHOL 179 09/24/2012   TRIG 138.0 09/24/2012   HDL 53.30 09/24/2012   LDLCALC 98 09/24/2012   ALT 17 06/30/2013   AST 16 06/30/2013   NA 139 07/08/2013   K 4.9 07/08/2013   CL 103 07/08/2013   CREATININE 0.65 07/08/2013   BUN 15 07/08/2013   CO2 23 07/08/2013   TSH 1.65 09/24/2012   PSA 1.23 09/24/2012   INR 0.96 06/30/2013   HGBA1C  5.6 01/15/2009          Assessment & Plan:

## 2013-12-30 NOTE — Progress Notes (Signed)
Pre visit review using our clinic review tool, if applicable. No additional management support is needed unless otherwise documented below in the visit note. 

## 2014-01-04 NOTE — Assessment & Plan Note (Signed)
S/p B TKR 

## 2014-01-04 NOTE — Assessment & Plan Note (Signed)
Continue with current prescription therapy as reflected on the Med list.  

## 2014-01-12 ENCOUNTER — Telehealth: Payer: Self-pay | Admitting: Internal Medicine

## 2014-01-12 DIAGNOSIS — N644 Mastodynia: Secondary | ICD-10-CM

## 2014-01-12 NOTE — Telephone Encounter (Signed)
Pt wife called in and said that spot on pt nipple is still hurting him and not getting better.  They would like a referral to get the mammogram for him at the [facility] on [address].

## 2014-01-12 NOTE — Telephone Encounter (Signed)
Please advise in PCPs absence.  

## 2014-01-13 ENCOUNTER — Other Ambulatory Visit: Payer: Self-pay | Admitting: Internal Medicine

## 2014-01-13 DIAGNOSIS — N644 Mastodynia: Secondary | ICD-10-CM | POA: Insufficient documentation

## 2014-01-13 NOTE — Telephone Encounter (Signed)
Pts wife informed.

## 2014-01-13 NOTE — Telephone Encounter (Signed)
ordered

## 2014-01-15 ENCOUNTER — Telehealth: Payer: Self-pay | Admitting: Internal Medicine

## 2014-01-15 NOTE — Telephone Encounter (Signed)
Forwarding 2 pages to Dr.Plotnikov, Cristie Hem

## 2014-01-21 ENCOUNTER — Ambulatory Visit
Admission: RE | Admit: 2014-01-21 | Discharge: 2014-01-21 | Disposition: A | Discharge status: Home / Self Care | Payer: BC Managed Care – PPO | Source: Ambulatory Visit | Attending: Internal Medicine | Admitting: Internal Medicine

## 2014-01-21 ENCOUNTER — Encounter: Payer: Self-pay | Admitting: Internal Medicine

## 2014-01-21 DIAGNOSIS — N644 Mastodynia: Secondary | ICD-10-CM

## 2014-02-10 ENCOUNTER — Other Ambulatory Visit: Payer: Self-pay | Admitting: Internal Medicine

## 2014-02-15 ENCOUNTER — Other Ambulatory Visit: Payer: Self-pay | Admitting: Internal Medicine

## 2014-03-24 ENCOUNTER — Telehealth: Payer: Self-pay | Admitting: Internal Medicine

## 2014-03-24 DIAGNOSIS — R739 Hyperglycemia, unspecified: Secondary | ICD-10-CM

## 2014-03-24 DIAGNOSIS — E785 Hyperlipidemia, unspecified: Secondary | ICD-10-CM

## 2014-03-24 DIAGNOSIS — R972 Elevated prostate specific antigen [PSA]: Secondary | ICD-10-CM

## 2014-03-24 DIAGNOSIS — I10 Essential (primary) hypertension: Secondary | ICD-10-CM

## 2014-03-24 DIAGNOSIS — E291 Testicular hypofunction: Secondary | ICD-10-CM

## 2014-03-24 DIAGNOSIS — E538 Deficiency of other specified B group vitamins: Secondary | ICD-10-CM

## 2014-03-24 NOTE — Telephone Encounter (Signed)
Labs ordered. Pt informed

## 2014-03-24 NOTE — Telephone Encounter (Signed)
Patient would like to know if plot can schedule blood work before appt on the 11th.

## 2014-03-30 ENCOUNTER — Other Ambulatory Visit (INDEPENDENT_AMBULATORY_CARE_PROVIDER_SITE_OTHER): Payer: 59

## 2014-03-30 DIAGNOSIS — E291 Testicular hypofunction: Secondary | ICD-10-CM

## 2014-03-30 DIAGNOSIS — R739 Hyperglycemia, unspecified: Secondary | ICD-10-CM

## 2014-03-30 DIAGNOSIS — R972 Elevated prostate specific antigen [PSA]: Secondary | ICD-10-CM

## 2014-03-30 DIAGNOSIS — I10 Essential (primary) hypertension: Secondary | ICD-10-CM

## 2014-03-30 DIAGNOSIS — E785 Hyperlipidemia, unspecified: Secondary | ICD-10-CM

## 2014-03-30 DIAGNOSIS — E538 Deficiency of other specified B group vitamins: Secondary | ICD-10-CM

## 2014-03-30 LAB — CBC WITH DIFFERENTIAL/PLATELET
Basophils Absolute: 0 10*3/uL (ref 0.0–0.1)
Basophils Relative: 0.5 % (ref 0.0–3.0)
EOS ABS: 0.2 10*3/uL (ref 0.0–0.7)
Eosinophils Relative: 2.6 % (ref 0.0–5.0)
HEMATOCRIT: 44.8 % (ref 39.0–52.0)
Hemoglobin: 15.1 g/dL (ref 13.0–17.0)
Lymphocytes Relative: 24.3 % (ref 12.0–46.0)
Lymphs Abs: 1.8 10*3/uL (ref 0.7–4.0)
MCHC: 33.7 g/dL (ref 30.0–36.0)
MCV: 92.8 fl (ref 78.0–100.0)
MONO ABS: 0.6 10*3/uL (ref 0.1–1.0)
MONOS PCT: 8.3 % (ref 3.0–12.0)
NEUTROS ABS: 4.7 10*3/uL (ref 1.4–7.7)
NEUTROS PCT: 64.3 % (ref 43.0–77.0)
Platelets: 263 10*3/uL (ref 150.0–400.0)
RBC: 4.83 Mil/uL (ref 4.22–5.81)
RDW: 13.5 % (ref 11.5–15.5)
WBC: 7.4 10*3/uL (ref 4.0–10.5)

## 2014-03-30 LAB — LIPID PANEL
CHOL/HDL RATIO: 3
Cholesterol: 184 mg/dL (ref 0–200)
HDL: 59.3 mg/dL (ref >39.00)
LDL Cholesterol: 95 mg/dL (ref 0–99)
NonHDL: 124.7
TRIGLYCERIDES: 149 mg/dL (ref 0.0–149.0)
VLDL: 29.8 mg/dL (ref 0.0–40.0)

## 2014-03-30 LAB — BASIC METABOLIC PANEL
BUN: 14 mg/dL (ref 6–23)
CALCIUM: 9.5 mg/dL (ref 8.4–10.5)
CO2: 29 mEq/L (ref 19–32)
Chloride: 105 mEq/L (ref 96–112)
Creatinine, Ser: 0.9 mg/dL (ref 0.4–1.5)
GFR: 95.47 mL/min (ref >60.00)
Glucose, Bld: 97 mg/dL (ref 70–99)
Potassium: 4.9 mEq/L (ref 3.5–5.1)
Sodium: 142 mEq/L (ref 135–145)

## 2014-03-30 LAB — HEPATIC FUNCTION PANEL
ALT: 22 U/L (ref 0–53)
AST: 17 U/L (ref 0–37)
Albumin: 4 g/dL (ref 3.5–5.2)
Alkaline Phosphatase: 59 U/L (ref 39–117)
BILIRUBIN DIRECT: 0.2 mg/dL (ref 0.0–0.3)
BILIRUBIN TOTAL: 0.8 mg/dL (ref 0.2–1.2)
TOTAL PROTEIN: 6.4 g/dL (ref 6.0–8.3)

## 2014-03-30 LAB — VITAMIN B12: Vitamin B-12: 635 pg/mL (ref 211–911)

## 2014-03-30 LAB — TESTOSTERONE: Testosterone: 134.92 ng/dL — ABNORMAL LOW (ref 300.00–890.00)

## 2014-03-30 LAB — PSA: PSA: 1.1 ng/mL (ref 0.10–4.00)

## 2014-03-30 LAB — TSH: TSH: 1.45 u[IU]/mL (ref 0.35–4.50)

## 2014-04-06 ENCOUNTER — Ambulatory Visit (INDEPENDENT_AMBULATORY_CARE_PROVIDER_SITE_OTHER): Payer: 59 | Admitting: Internal Medicine

## 2014-04-06 ENCOUNTER — Encounter: Payer: Self-pay | Admitting: Internal Medicine

## 2014-04-06 VITALS — BP 118/80 | HR 94 | Temp 97.6°F | Wt 204.0 lb

## 2014-04-06 DIAGNOSIS — E538 Deficiency of other specified B group vitamins: Secondary | ICD-10-CM

## 2014-04-06 DIAGNOSIS — I10 Essential (primary) hypertension: Secondary | ICD-10-CM

## 2014-04-06 DIAGNOSIS — E291 Testicular hypofunction: Secondary | ICD-10-CM

## 2014-04-06 MED ORDER — FENTANYL 25 MCG/HR TD PT72: 25.0000 ug | MEDICATED_PATCH | TRANSDERMAL | Status: DC

## 2014-04-06 MED ORDER — OXYCODONE HCL 5 MG PO TABS: ORAL_TABLET | ORAL | Status: DC

## 2014-04-06 MED ORDER — DULOXETINE HCL 60 MG PO CPEP: 120.0000 mg | ORAL_CAPSULE | Freq: Two times a day (BID) | ORAL | Status: DC

## 2014-04-06 NOTE — Assessment & Plan Note (Signed)
Continue with current prescription therapy as reflected on the Med list.  

## 2014-04-06 NOTE — Progress Notes (Signed)
Pre visit review using our clinic review tool, if applicable. No additional management support is needed unless otherwise documented below in the visit note. 

## 2014-04-06 NOTE — Progress Notes (Signed)
Subjective:    HPI  C/o depression, grief after a dog died, mother was dx'd w/liver cancer  F/u R nipple discomfort x 1 month He fell 2 times   F/u knee B pain - B TKR. Getting  R TKR on 4/15 by Dr Noemi Chapel next wk  The patient is here to follow up on chronic HTN, dyslipidemia, insomnia and chronic moderate spastic fibromyalgia/CFS symptoms controlled some with medicines.   FMS is 80% better on Fentanyl/Tramadol    F/u cramps  Wt Readings from Last 3 Encounters:  04/06/14 204 lb (92.534 kg)  12/30/13 200 lb (90.719 kg)  09/30/13 206 lb (93.441 kg)   BP Readings from Last 3 Encounters:  04/06/14 118/80  12/30/13 110/84  09/30/13 130/84      Review of Systems  Constitutional: Positive for fatigue. Negative for appetite change and unexpected weight change.  HENT: Negative for congestion, nosebleeds, sneezing, sore throat and trouble swallowing.   Eyes: Negative for itching and visual disturbance.  Respiratory: Negative for cough.   Cardiovascular: Negative for chest pain, palpitations and leg swelling.  Gastrointestinal: Negative for nausea, diarrhea, blood in stool and abdominal distention.  Genitourinary: Negative for frequency and hematuria.  Musculoskeletal: Positive for myalgias, back pain and arthralgias. Negative for joint swelling, gait problem and neck pain.  Skin: Negative for rash.  Neurological: Negative for dizziness, tremors, speech difficulty and weakness.  Psychiatric/Behavioral: Positive for sleep disturbance. Negative for suicidal ideas, dysphoric mood and agitation. The patient is not nervous/anxious.        Objective:   Physical Exam  Constitutional: He is oriented to person, place, and time. He appears well-developed. No distress.  NAD  HENT:  Mouth/Throat: Oropharynx is clear and moist.  Eyes: Conjunctivae are normal. Pupils are equal, round, and reactive to light.  Neck: Normal range of motion. No JVD present. No thyromegaly present.   Cardiovascular: Normal rate, regular rhythm, normal heart sounds and intact distal pulses.  Exam reveals no gallop and no friction rub.   No murmur heard. Pulmonary/Chest: Effort normal and breath sounds normal. No respiratory distress. He has no wheezes. He has no rales. He exhibits no tenderness.  Abdominal: Soft. Bowel sounds are normal. He exhibits no distension and no mass. There is no tenderness. There is no rebound and no guarding.  Musculoskeletal: Normal range of motion. He exhibits no edema or tenderness.  Lymphadenopathy:    He has no cervical adenopathy.  Neurological: He is alert and oriented to person, place, and time. He has normal reflexes. No cranial nerve deficit. He exhibits normal muscle tone. He displays a negative Romberg sign. Coordination and gait normal.  No meningeal signs  Skin: Skin is warm and dry. No rash noted.  Psychiatric: He has a normal mood and affect. His behavior is normal. Judgment and thought content normal.  Limping  R breast tissue is generous Tearful, anxious   Lab Results  Component Value Date   WBC 7.4 03/30/2014   HGB 15.1 03/30/2014   HCT 44.8 03/30/2014   PLT 263.0 03/30/2014   GLUCOSE 97 03/30/2014   CHOL 184 03/30/2014   TRIG 149.0 03/30/2014   HDL 59.30 03/30/2014   LDLCALC 95 03/30/2014   ALT 22 03/30/2014   AST 17 03/30/2014   NA 142 03/30/2014   K 4.9 03/30/2014   CL 105 03/30/2014   CREATININE 0.9 03/30/2014   BUN 14 03/30/2014   CO2 29 03/30/2014   TSH 1.45 03/30/2014   PSA 1.10 03/30/2014  INR 0.96 06/30/2013   HGBA1C 5.6 01/15/2009          Assessment & Plan:

## 2014-04-06 NOTE — Patient Instructions (Signed)

## 2014-04-06 NOTE — Assessment & Plan Note (Signed)
Dr Diona Fanti. His free testosterone was ok in 2014 Treatment options are discussed

## 2014-04-30 ENCOUNTER — Other Ambulatory Visit: Payer: Self-pay | Admitting: Internal Medicine

## 2014-05-01 ENCOUNTER — Other Ambulatory Visit: Payer: Self-pay | Admitting: Internal Medicine

## 2014-05-01 NOTE — Telephone Encounter (Signed)
Refill done.  

## 2014-05-14 ENCOUNTER — Other Ambulatory Visit: Payer: Self-pay | Admitting: Internal Medicine

## 2014-07-06 ENCOUNTER — Encounter: Payer: Self-pay | Admitting: Internal Medicine

## 2014-07-06 ENCOUNTER — Ambulatory Visit (INDEPENDENT_AMBULATORY_CARE_PROVIDER_SITE_OTHER): Payer: 59 | Admitting: Internal Medicine

## 2014-07-06 VITALS — BP 112/80 | HR 76 | Temp 98.3°F | Resp 16 | Wt 205.0 lb

## 2014-07-06 DIAGNOSIS — E538 Deficiency of other specified B group vitamins: Secondary | ICD-10-CM | POA: Diagnosis not present

## 2014-07-06 DIAGNOSIS — M791 Myalgia: Secondary | ICD-10-CM | POA: Diagnosis not present

## 2014-07-06 DIAGNOSIS — F32A Depression, unspecified: Secondary | ICD-10-CM

## 2014-07-06 DIAGNOSIS — M609 Myositis, unspecified: Secondary | ICD-10-CM

## 2014-07-06 DIAGNOSIS — F329 Major depressive disorder, single episode, unspecified: Secondary | ICD-10-CM | POA: Diagnosis not present

## 2014-07-06 DIAGNOSIS — I10 Essential (primary) hypertension: Secondary | ICD-10-CM | POA: Diagnosis not present

## 2014-07-06 DIAGNOSIS — IMO0001 Reserved for inherently not codable concepts without codable children: Secondary | ICD-10-CM

## 2014-07-06 MED ORDER — BACLOFEN 20 MG PO TABS: 20.0000 mg | ORAL_TABLET | Freq: Three times a day (TID) | ORAL | Status: DC

## 2014-07-06 MED ORDER — BUPROPION HCL ER (SR) 150 MG PO TB12: 300.0000 mg | ORAL_TABLET | Freq: Every day | ORAL | Status: DC

## 2014-07-06 MED ORDER — DIAZEPAM 5 MG PO TABS: 5.0000 mg | ORAL_TABLET | Freq: Four times a day (QID) | ORAL | Status: DC | PRN

## 2014-07-06 MED ORDER — FENTANYL 25 MCG/HR TD PT72: 25.0000 ug | MEDICATED_PATCH | TRANSDERMAL | Status: DC

## 2014-07-06 MED ORDER — OXYCODONE HCL 5 MG PO TABS: ORAL_TABLET | ORAL | Status: DC

## 2014-07-06 MED ORDER — DULOXETINE HCL 60 MG PO CPEP: 120.0000 mg | ORAL_CAPSULE | Freq: Two times a day (BID) | ORAL | Status: DC

## 2014-07-06 NOTE — Progress Notes (Signed)
Subjective:    HPI  C/o constipation  F/u depression, grief after a dog died, mother was dx'd w/liver cancer  F/u R nipple discomfort x 1 month He fell 2 times   F/u knee B pain - B TKR. Getting  R TKR on 4/15 by Dr Noemi Chapel next wk  The patient is here to follow up on chronic HTN, dyslipidemia, insomnia and chronic moderate spastic fibromyalgia/CFS symptoms controlled some with medicines.   FMS is 80% better on Fentanyl/Tramadol    F/u cramps  Wt Readings from Last 3 Encounters:  07/06/14 205 lb (92.987 kg)  04/06/14 204 lb (92.534 kg)  12/30/13 200 lb (90.719 kg)   BP Readings from Last 3 Encounters:  07/06/14 112/80  04/06/14 118/80  12/30/13 110/84      Review of Systems  Constitutional: Positive for fatigue. Negative for appetite change and unexpected weight change.  HENT: Negative for congestion, nosebleeds, sneezing, sore throat and trouble swallowing.   Eyes: Negative for itching and visual disturbance.  Respiratory: Negative for cough.   Cardiovascular: Negative for chest pain, palpitations and leg swelling.  Gastrointestinal: Negative for nausea, diarrhea, blood in stool and abdominal distention.  Genitourinary: Negative for frequency and hematuria.  Musculoskeletal: Positive for myalgias, back pain and arthralgias. Negative for joint swelling, gait problem and neck pain.  Skin: Negative for rash.  Neurological: Negative for dizziness, tremors, speech difficulty and weakness.  Psychiatric/Behavioral: Positive for sleep disturbance. Negative for suicidal ideas, dysphoric mood and agitation. The patient is not nervous/anxious.        Objective:   Physical Exam  Constitutional: He is oriented to person, place, and time. He appears well-developed. No distress.  NAD  HENT:  Mouth/Throat: Oropharynx is clear and moist.  Eyes: Conjunctivae are normal. Pupils are equal, round, and reactive to light.  Neck: Normal range of motion. No JVD present. No  thyromegaly present.  Cardiovascular: Normal rate, regular rhythm, normal heart sounds and intact distal pulses.  Exam reveals no gallop and no friction rub.   No murmur heard. Pulmonary/Chest: Effort normal and breath sounds normal. No respiratory distress. He has no wheezes. He has no rales. He exhibits no tenderness.  Abdominal: Soft. Bowel sounds are normal. He exhibits no distension and no mass. There is no tenderness. There is no rebound and no guarding.  Musculoskeletal: Normal range of motion. He exhibits no edema or tenderness.  Lymphadenopathy:    He has no cervical adenopathy.  Neurological: He is alert and oriented to person, place, and time. He has normal reflexes. No cranial nerve deficit. He exhibits normal muscle tone. He displays a negative Romberg sign. Coordination and gait normal.  No meningeal signs  Skin: Skin is warm and dry. No rash noted.  Psychiatric: He has a normal mood and affect. His behavior is normal. Judgment and thought content normal.  Limping  R breast tissue is generous Tearful, anxious   Lab Results  Component Value Date   WBC 7.4 03/30/2014   HGB 15.1 03/30/2014   HCT 44.8 03/30/2014   PLT 263.0 03/30/2014   GLUCOSE 97 03/30/2014   CHOL 184 03/30/2014   TRIG 149.0 03/30/2014   HDL 59.30 03/30/2014   LDLCALC 95 03/30/2014   ALT 22 03/30/2014   AST 17 03/30/2014   NA 142 03/30/2014   K 4.9 03/30/2014   CL 105 03/30/2014   CREATININE 0.9 03/30/2014   BUN 14 03/30/2014   CO2 29 03/30/2014   TSH 1.45 03/30/2014   PSA 1.10  03/30/2014   INR 0.96 06/30/2013   HGBA1C 5.6 01/15/2009          Assessment & Plan:

## 2014-07-06 NOTE — Progress Notes (Signed)
Pre visit review using our clinic review tool, if applicable. No additional management support is needed unless otherwise documented below in the visit note. 

## 2014-07-12 DIAGNOSIS — E785 Hyperlipidemia, unspecified: Secondary | ICD-10-CM | POA: Insufficient documentation

## 2014-07-12 NOTE — Assessment & Plan Note (Signed)
On Losartan 

## 2014-07-12 NOTE — Assessment & Plan Note (Signed)
On Vit B12 

## 2014-07-12 NOTE — Assessment & Plan Note (Signed)
Cymbalta 

## 2014-07-12 NOTE — Assessment & Plan Note (Addendum)
Fentanyl, Oxycodone, Diazepam   Potential benefits of a long term opioids use as well as potential risks (i.e. addiction risk, apnea etc) and complications (i.e. Somnolence, constipation and others) were explained to the patient and were aknowledged.

## 2014-07-22 ENCOUNTER — Telehealth: Payer: Self-pay | Admitting: *Deleted

## 2014-07-22 NOTE — Telephone Encounter (Signed)
Pharmacy states pt has always taken Cymbalta 60 mg 1 po bid.   Most recent Rx was for 2 bid. Which is correct?

## 2014-07-23 ENCOUNTER — Other Ambulatory Visit: Payer: Self-pay | Admitting: Internal Medicine

## 2014-07-23 MED ORDER — DULOXETINE HCL 60 MG PO CPEP: 60.0000 mg | ORAL_CAPSULE | Freq: Two times a day (BID) | ORAL | Status: DC

## 2014-07-23 NOTE — Telephone Encounter (Signed)
1 po bid is correct. Sorry! Thx

## 2014-07-23 NOTE — Telephone Encounter (Signed)
[facility] informed.

## 2014-08-05 ENCOUNTER — Other Ambulatory Visit: Payer: Self-pay | Admitting: Internal Medicine

## 2014-10-07 ENCOUNTER — Encounter: Payer: Self-pay | Admitting: Internal Medicine

## 2014-10-07 ENCOUNTER — Ambulatory Visit (INDEPENDENT_AMBULATORY_CARE_PROVIDER_SITE_OTHER): Payer: 59 | Admitting: Internal Medicine

## 2014-10-07 ENCOUNTER — Other Ambulatory Visit: Payer: Self-pay | Admitting: Internal Medicine

## 2014-10-07 VITALS — BP 120/82 | HR 69 | Wt 205.0 lb

## 2014-10-07 DIAGNOSIS — I10 Essential (primary) hypertension: Secondary | ICD-10-CM | POA: Diagnosis not present

## 2014-10-07 DIAGNOSIS — M791 Myalgia: Secondary | ICD-10-CM

## 2014-10-07 DIAGNOSIS — E538 Deficiency of other specified B group vitamins: Secondary | ICD-10-CM | POA: Diagnosis not present

## 2014-10-07 DIAGNOSIS — R972 Elevated prostate specific antigen [PSA]: Secondary | ICD-10-CM

## 2014-10-07 DIAGNOSIS — M609 Myositis, unspecified: Secondary | ICD-10-CM

## 2014-10-07 DIAGNOSIS — IMO0001 Reserved for inherently not codable concepts without codable children: Secondary | ICD-10-CM

## 2014-10-07 MED ORDER — FENTANYL 25 MCG/HR TD PT72: 25.0000 ug | MEDICATED_PATCH | TRANSDERMAL | Status: DC

## 2014-10-07 MED ORDER — LOVASTATIN 40 MG PO TABS: 40.0000 mg | ORAL_TABLET | Freq: Every day | ORAL | Status: DC

## 2014-10-07 MED ORDER — OXYCODONE HCL 5 MG PO TABS: ORAL_TABLET | ORAL | Status: DC

## 2014-10-07 MED ORDER — TEMAZEPAM 30 MG PO CAPS: 30.0000 mg | ORAL_CAPSULE | Freq: Every evening | ORAL | Status: DC | PRN

## 2014-10-07 MED ORDER — BUPROPION HCL ER (SR) 150 MG PO TB12: 300.0000 mg | ORAL_TABLET | Freq: Every day | ORAL | Status: DC

## 2014-10-07 NOTE — Assessment & Plan Note (Signed)
Dr Dahlstedt      

## 2014-10-07 NOTE — Progress Notes (Signed)
Pre visit review using our clinic review tool, if applicable. No additional management support is needed unless otherwise documented below in the visit note. 

## 2014-10-07 NOTE — Progress Notes (Signed)
   Subjective:    HPI    F/u depression  F/u knee B pain - B TKR. Getting  R TKR on 4/15 by Dr Noemi Chapel next wk  The patient is here to follow up on chronic HTN, dyslipidemia, insomnia and chronic moderate spastic fibromyalgia/CFS symptoms controlled some with medicines.    Wt Readings from Last 3 Encounters:  10/07/14 205 lb (92.987 kg)  07/06/14 205 lb (92.987 kg)  04/06/14 204 lb (92.534 kg)   BP Readings from Last 3 Encounters:  10/07/14 120/82  07/06/14 112/80  04/06/14 118/80      Review of Systems  Constitutional: Positive for fatigue. Negative for appetite change and unexpected weight change.  HENT: Negative for congestion, nosebleeds, sneezing, sore throat and trouble swallowing.   Eyes: Negative for itching and visual disturbance.  Respiratory: Negative for cough.   Cardiovascular: Negative for chest pain, palpitations and leg swelling.  Gastrointestinal: Negative for nausea, diarrhea, blood in stool and abdominal distention.  Genitourinary: Negative for frequency and hematuria.  Musculoskeletal: Positive for myalgias, back pain and arthralgias. Negative for joint swelling, gait problem and neck pain.  Skin: Negative for rash.  Neurological: Negative for dizziness, tremors, speech difficulty and weakness.  Psychiatric/Behavioral: Positive for sleep disturbance. Negative for suicidal ideas, dysphoric mood and agitation. The patient is not nervous/anxious.        Objective:   Physical Exam  Constitutional: He is oriented to person, place, and time. He appears well-developed. No distress.  NAD  HENT:  Mouth/Throat: Oropharynx is clear and moist.  Eyes: Conjunctivae are normal. Pupils are equal, round, and reactive to light.  Neck: Normal range of motion. No JVD present. No thyromegaly present.  Cardiovascular: Normal rate, regular rhythm, normal heart sounds and intact distal pulses.  Exam reveals no gallop and no friction rub.   No murmur  heard. Pulmonary/Chest: Effort normal and breath sounds normal. No respiratory distress. He has no wheezes. He has no rales. He exhibits no tenderness.  Abdominal: Soft. Bowel sounds are normal. He exhibits no distension and no mass. There is no tenderness. There is no rebound and no guarding.  Musculoskeletal: Normal range of motion. He exhibits no edema or tenderness.  Lymphadenopathy:    He has no cervical adenopathy.  Neurological: He is alert and oriented to person, place, and time. He has normal reflexes. No cranial nerve deficit. He exhibits normal muscle tone. He displays a negative Romberg sign. Coordination and gait normal.  No meningeal signs  Skin: Skin is warm and dry. No rash noted.  Psychiatric: He has a normal mood and affect. His behavior is normal. Judgment and thought content normal.  Limping  R breast tissue is generous Tearful, anxious   Lab Results  Component Value Date   WBC 7.4 03/30/2014   HGB 15.1 03/30/2014   HCT 44.8 03/30/2014   PLT 263.0 03/30/2014   GLUCOSE 97 03/30/2014   CHOL 184 03/30/2014   TRIG 149.0 03/30/2014   HDL 59.30 03/30/2014   LDLCALC 95 03/30/2014   ALT 22 03/30/2014   AST 17 03/30/2014   NA 142 03/30/2014   K 4.9 03/30/2014   CL 105 03/30/2014   CREATININE 0.9 03/30/2014   BUN 14 03/30/2014   CO2 29 03/30/2014   TSH 1.45 03/30/2014   PSA 1.10 03/30/2014   INR 0.96 06/30/2013   HGBA1C 5.6 01/15/2009          Assessment & Plan:

## 2014-10-07 NOTE — Assessment & Plan Note (Signed)
On B12 

## 2014-10-07 NOTE — Assessment & Plan Note (Signed)
Duragesic, Oxy IR  Potential benefits of a long term opioids use as well as potential risks (i.e. addiction risk, apnea etc) and complications (i.e. Somnolence, constipation and others) were explained to the patient and were aknowledged.  

## 2014-10-07 NOTE — Assessment & Plan Note (Signed)
Losartan 

## 2014-10-15 ENCOUNTER — Other Ambulatory Visit: Payer: Self-pay | Admitting: Internal Medicine

## 2014-10-16 ENCOUNTER — Other Ambulatory Visit: Payer: Self-pay | Admitting: Internal Medicine

## 2014-11-02 ENCOUNTER — Telehealth: Payer: Self-pay

## 2014-11-02 MED ORDER — DULOXETINE HCL 60 MG PO CPEP: 60.0000 mg | ORAL_CAPSULE | Freq: Every day | ORAL | Status: DC

## 2014-11-02 NOTE — Telephone Encounter (Signed)
Called pt: no answer/left message.   Sent pt [employer].

## 2014-11-02 NOTE — Telephone Encounter (Signed)
PA for duloxetine was denied. Plan is able to approve #[id number] of 60 mg daily but not 60mg  bid. Please advise.

## 2014-11-02 NOTE — Telephone Encounter (Signed)
Stay on 60 mg/d Thx

## 2014-11-02 NOTE — Telephone Encounter (Signed)
Pt stated that he is able to get 120 mg of duloxetine covered through another pharmacy. If it is okay he would like to continue to get it through them.

## 2014-11-02 NOTE — Telephone Encounter (Signed)
Ok Thx 

## 2014-12-11 ENCOUNTER — Telehealth: Payer: Self-pay | Admitting: *Deleted

## 2014-12-11 NOTE — Telephone Encounter (Signed)
He feels better on 1 bid Thx

## 2014-12-11 NOTE — Telephone Encounter (Signed)
PA Case [id number] is denied via [employer]. I called [phone]. His plan will cover Cymbalta 60 mg 1 qd without PA.  If he must have Duloxetine 60 mg bid, they need a explanation as to why. Why does pt need Cymbalta 60 mg 1 bid?

## 2014-12-17 NOTE — Telephone Encounter (Signed)
I called pt's plan at number below. His mem ID # [id number].  I spoke to [place] and advised  of below.  She states based on my answers she was able to get an approval. Pt's pharmacy informed.

## 2015-01-03 ENCOUNTER — Other Ambulatory Visit: Payer: Self-pay | Admitting: Internal Medicine

## 2015-01-06 ENCOUNTER — Ambulatory Visit (INDEPENDENT_AMBULATORY_CARE_PROVIDER_SITE_OTHER): Payer: 59 | Admitting: Internal Medicine

## 2015-01-06 ENCOUNTER — Encounter: Payer: Self-pay | Admitting: Internal Medicine

## 2015-01-06 VITALS — BP 120/86 | HR 80 | Wt 210.0 lb

## 2015-01-06 DIAGNOSIS — Z23 Encounter for immunization: Secondary | ICD-10-CM | POA: Diagnosis not present

## 2015-01-06 DIAGNOSIS — R972 Elevated prostate specific antigen [PSA]: Secondary | ICD-10-CM

## 2015-01-06 DIAGNOSIS — M791 Myalgia: Secondary | ICD-10-CM | POA: Diagnosis not present

## 2015-01-06 DIAGNOSIS — E559 Vitamin D deficiency, unspecified: Secondary | ICD-10-CM | POA: Diagnosis not present

## 2015-01-06 DIAGNOSIS — M609 Myositis, unspecified: Secondary | ICD-10-CM

## 2015-01-06 DIAGNOSIS — E538 Deficiency of other specified B group vitamins: Secondary | ICD-10-CM | POA: Diagnosis not present

## 2015-01-06 DIAGNOSIS — E291 Testicular hypofunction: Secondary | ICD-10-CM

## 2015-01-06 DIAGNOSIS — IMO0001 Reserved for inherently not codable concepts without codable children: Secondary | ICD-10-CM

## 2015-01-06 MED ORDER — OXYCODONE HCL 5 MG PO TABS: ORAL_TABLET | ORAL | Status: DC

## 2015-01-06 MED ORDER — FENTANYL 25 MCG/HR TD PT72: 25.0000 ug | MEDICATED_PATCH | TRANSDERMAL | Status: DC

## 2015-01-06 MED ORDER — DIAZEPAM 5 MG PO TABS: 5.0000 mg | ORAL_TABLET | Freq: Four times a day (QID) | ORAL | Status: DC | PRN

## 2015-01-06 NOTE — Assessment & Plan Note (Signed)
Testosterone IM vs Androgel discussed

## 2015-01-06 NOTE — Assessment & Plan Note (Signed)
Chronic - severe spastic pains 2013 - worse, esp B knee pain Duragesic, Oxy IR  Potential benefits of a long term opioids use as well as potential risks (i.e. addiction risk, apnea etc) and complications (i.e. Somnolence, constipation and others) were explained to the patient and were aknowledged. 7/15 Duragesic would last x 2 d only

## 2015-01-06 NOTE — Progress Notes (Signed)
Pre visit review using our clinic review tool, if applicable. No additional management support is needed unless otherwise documented below in the visit note. 

## 2015-01-06 NOTE — Assessment & Plan Note (Signed)
On Vit D 

## 2015-01-06 NOTE — Patient Instructions (Addendum)
There are natural ways to boost your testosterone:  1. Lose Weight If you're overweight, shedding the excess pounds may increase your testosterone levels, according to multiple research. Overweight men are more likely to have low testosterone levels to begin with, so this is an important trick to increase your body's testosterone production when you need it most.   2. Strength Training    Strength training is also known to boost testosterone levels, provided you are doing so intensely enough. When strength training to boost testosterone, you'll want to increase the weight and lower your number of reps, and then focus on exercises that work a large number of muscles.  3. Optimize Your Vitamin D Levels Vitamin D, a steroid hormone, is essential for the healthy development of the nucleus of the sperm cell, and helps maintain semen quality and sperm count. Vitamin D also increases levels of testosterone, which may boost libido. In one study, overweight men who were given vitamin D supplements had a significant increase in testosterone levels after one year.  4. Reduce Stress When you're under a lot of stress, your body releases high levels of the stress hormone cortisol. This hormone actually blocks the effects of testosterone, presumably because, from a biological standpoint, testosterone-associated behaviors (mating, competing, aggression) may have lowered your chances of survival in an emergency (hence, the "fight or flight" response is dominant, courtesy of cortisol).  5. Limit or Eliminate Sugar from Your Diet Testosterone levels decrease after you eat sugar, which is likely because the sugar leads to a high insulin level, another factor leading to low testosterone.  6. Eat Healthy Fats By healthy, this means not only mon- and polyunsaturated fats, like that found in avocadoes and nuts, but also saturated, as these are essential for building testosterone. Research shows that a diet with less than  40 percent of energy as fat (and that mainly from animal sources, i.e. saturated) lead to a decrease in testosterone levels.  It's important to understand that your body requires saturated fats from animal and vegetable sources (such as meat, dairy, certain oils, and tropical plants like coconut) for optimal functioning, and if you neglect this important food group in favor of sugar, grains and other starchy carbs, your health and weight are almost guaranteed to suffer. Examples of healthy fats you can eat more of to give your testosterone levels a boost include:  Olives and Olive oil  Coconuts and coconut oil Butter made from organic milk  Raw nuts, such as, almonds or pecans Eggs Avocados   Meats Palm oil Unheated organic nut oils   7. "Testosterone boosters" containing Vitamin D-3, Niacin, Vitamin B-6, Vitamin B-12, Magnesium, Zinc, Selenium, D-Aspartic Acid, Fenugreed Seed Extract, Oystershell, [place] Extract, [place] Ginseng may be helpful as well.  ha

## 2015-01-06 NOTE — Assessment & Plan Note (Signed)
Chronic ?On Vit B12 ?

## 2015-01-06 NOTE — Assessment & Plan Note (Addendum)
Dr D said it is ok to use testosterone PSA

## 2015-01-06 NOTE — Progress Notes (Signed)
Subjective:  Patient ID: Lee Owens, male    DOB: 20-Sep-1950  Age: 64 y.o. MRN: [id number]  CC: No chief complaint on file.   HPI Lee Owens presents for chronic pain and stiffness, Vit B12 def, insomnia.  Outpatient Prescriptions Prior to Visit  Medication Sig Dispense Refill  . baclofen (LIORESAL) 20 MG tablet TAKE 1 TABLET BY MOUTH THREE TIMES DAILY 270 tablet 2  . bisacodyl (DULCOLAX) 5 MG EC tablet Take 2 tablets every night with dinner until bowel movement.  LAXITIVE.  Restart if two days since last bowel movement 30 tablet 0  . buPROPion (WELLBUTRIN XL) 150 MG 24 hr tablet Take 2 tablets (300 mg total) by mouth daily. 180 tablet 3  . celecoxib (CELEBREX) 200 MG capsule Take 1 capsule (200 mg total) by mouth daily. 30 capsule 3  . Cholecalciferol (VITAMIN D-3) 5000 UNITS TABS Take 5,000 Units by mouth daily.    Marland Kitchen docusate sodium (COLACE) 100 MG capsule Take 100 mg by mouth daily as needed for mild constipation.    . DULoxetine (CYMBALTA) 60 MG capsule Take 1 capsule (60 mg total) by mouth daily. 90 capsule 3  . finasteride (PROSCAR) 5 MG tablet Take 5 mg by mouth daily.    Marland Kitchen losartan (COZAAR) 100 MG tablet TAKE 1 TABLET BY MOUTH DAILY 90 tablet 1  . lovastatin (MEVACOR) 40 MG tablet Take 1 tablet (40 mg total) by mouth at bedtime. 90 tablet 3  . Probiotic Product (ALIGN PO) Take 1 capsule by mouth daily.      . silodosin (RAPAFLO) 8 MG CAPS capsule Take 8 mg by mouth as needed (to relieve urinary symptoms).     . temazepam (RESTORIL) 30 MG capsule Take 1-2 capsules (30-60 mg total) by mouth at bedtime as needed for sleep. 30 capsule 5  . vitamin B-12 (CYANOCOBALAMIN) 1000 MCG tablet Take 2,000 mcg by mouth daily.     Marland Kitchen aspirin 325 MG EC tablet 1 tab a day for the next 30 days to prevent blood clots 30 tablet 0  . buPROPion (WELLBUTRIN SR) 150 MG 12 hr tablet Take 2 tablets (300 mg total) by mouth daily. 60 tablet 5  . diazepam (VALIUM) 5 MG tablet Take 1 tablet (5 mg total)  by mouth every 6 (six) hours as needed for anxiety. 120 tablet 3  . fentaNYL (DURAGESIC) 25 MCG/HR patch Place 1 patch (25 mcg total) onto the skin every other day. Please fill on or after 10/07/14 15 patch 0  . oxyCODONE (OXY IR/ROXICODONE) 5 MG immediate release tablet 1-2 tablets every 4-6 hrs as needed for pain  Please fill on or after 10/07/14 120 tablet 0  . acetaminophen (TYLENOL) 325 MG tablet Take 2 tablets (650 mg total) by mouth every 6 (six) hours as needed for mild pain (or Fever >/= 101). (Patient not taking: Reported on 01/06/2015)    . LORazepam (ATIVAN) 1 MG tablet Take 1 tablet (1 mg total) by mouth every 8 (eight) hours as needed for anxiety. (Patient not taking: Reported on 01/06/2015) 60 tablet 0   No facility-administered medications prior to visit.    ROS Review of Systems  Constitutional: Negative for appetite change, fatigue and unexpected weight change.  HENT: Negative for congestion, nosebleeds, rhinorrhea, sneezing, sore throat and trouble swallowing.   Eyes: Negative for itching and visual disturbance.  Respiratory: Negative for cough.   Cardiovascular: Negative for chest pain, palpitations and leg swelling.  Gastrointestinal: Positive for constipation. Negative for nausea, diarrhea, blood  in stool and abdominal distention.  Genitourinary: Negative for frequency and hematuria.  Musculoskeletal: Positive for myalgias, back pain, joint swelling, arthralgias, gait problem, neck pain and neck stiffness.  Skin: Negative for rash.  Neurological: Negative for dizziness, tremors, speech difficulty and weakness.  Psychiatric/Behavioral: Positive for sleep disturbance and decreased concentration. Negative for dysphoric mood and agitation. The patient is nervous/anxious.     Objective:  BP 120/86 mmHg  Pulse 80  Wt 210 lb (95.255 kg)  SpO2 94%  BP Readings from Last 3 Encounters:  01/06/15 120/86  10/07/14 120/82  07/06/14 112/80    Wt Readings from Last 3  Encounters:  01/06/15 210 lb (95.255 kg)  10/07/14 205 lb (92.987 kg)  07/06/14 205 lb (92.987 kg)    Physical Exam  Constitutional: He is oriented to person, place, and time. He appears well-developed. No distress.  NAD  HENT:  Mouth/Throat: Oropharynx is clear and moist.  Eyes: Conjunctivae are normal. Pupils are equal, round, and reactive to light.  Neck: Normal range of motion. No JVD present. No thyromegaly present.  Cardiovascular: Normal rate, regular rhythm, normal heart sounds and intact distal pulses.  Exam reveals no gallop and no friction rub.   No murmur heard. Pulmonary/Chest: Effort normal and breath sounds normal. No respiratory distress. He has no wheezes. He has no rales. He exhibits no tenderness.  Abdominal: Soft. Bowel sounds are normal. He exhibits no distension and no mass. There is no tenderness. There is no rebound and no guarding.  Musculoskeletal: Normal range of motion. He exhibits tenderness. He exhibits no edema.  Lymphadenopathy:    He has no cervical adenopathy.  Neurological: He is alert and oriented to person, place, and time. He has normal reflexes. No cranial nerve deficit. He exhibits normal muscle tone. He displays a negative Romberg sign. Coordination and gait normal.  Skin: Skin is warm and dry. No rash noted.  Psychiatric: His behavior is normal. Judgment and thought content normal.    Lab Results  Component Value Date   WBC 7.4 03/30/2014   HGB 15.1 03/30/2014   HCT 44.8 03/30/2014   PLT 263.0 03/30/2014   GLUCOSE 97 03/30/2014   CHOL 184 03/30/2014   TRIG 149.0 03/30/2014   HDL 59.30 03/30/2014   LDLCALC 95 03/30/2014   ALT 22 03/30/2014   AST 17 03/30/2014   NA 142 03/30/2014   K 4.9 03/30/2014   CL 105 03/30/2014   CREATININE 0.9 03/30/2014   BUN 14 03/30/2014   CO2 29 03/30/2014   TSH 1.45 03/30/2014   PSA 1.10 03/30/2014   INR 0.96 06/30/2013   HGBA1C 5.6 01/15/2009    Mm Digital Diagnostic Bilat  01/21/2014  CLINICAL  DATA:  Palpable right breast mass, 64 year old male. History of a recent fall with trauma to the right chest wall. The patient takes multiple medications including finasteride, which has been associated with gynecomastia. EXAM: DIGITAL DIAGNOSTIC  bilateral MAMMOGRAM WITH CAD COMPARISON:  None. ACR Breast Density Category a: The breast tissue is almost entirely fatty. FINDINGS: Right greater than left benign appearing retroareolar gynecomastia is identified without suspicious imaging feature. Mammographic images were processed with CAD. IMPRESSION: Right remote left mild gynecomastia. Clinical followup is suggested. Of the patient's medications provided on a list he brought with him today, finasteride is most likely to be associated with gynecomastia. RECOMMENDATION: Clinical followup I have discussed the findings and recommendations with the patient. Results were also provided in writing at the conclusion of the visit. If applicable, a  reminder letter will be sent to the patient regarding the next appointment. BI-RADS CATEGORY  1: Negative. Electronically Signed   By: Conchita Paris M.D.   On: 01/21/2014 14:50    Assessment & Plan:   Diagnoses and all orders for this visit:  Vitamin B12 deficiency  Myalgia and myositis  Vitamin D deficiency  Hypogonadism, male  PSA, INCREASED  Need for influenza vaccination -     Flu Vaccine QUAD 36+ mos IM  Other orders -     Discontinue: oxyCODONE (OXY IR/ROXICODONE) 5 MG immediate release tablet; 1-2 tablets every 4-6 hrs as needed for pain  Please fill on or after 01/07/15 -     Discontinue: fentaNYL (DURAGESIC) 25 MCG/HR patch; Place 1 patch (25 mcg total) onto the skin every other day. Please fill on or after 01/07/15 -     diazepam (VALIUM) 5 MG tablet; Take 1 tablet (5 mg total) by mouth every 6 (six) hours as needed for anxiety. -     Discontinue: oxyCODONE (OXY IR/ROXICODONE) 5 MG immediate release tablet; 1-2 tablets every 4-6 hrs as needed for  pain  Please fill on or after 02/07/15 -     Discontinue: fentaNYL (DURAGESIC) 25 MCG/HR patch; Place 1 patch (25 mcg total) onto the skin every other day. Please fill on or after 02/07/15 -     fentaNYL (DURAGESIC) 25 MCG/HR patch; Place 1 patch (25 mcg total) onto the skin every other day. Please fill on or after 03/09/15 -     oxyCODONE (OXY IR/ROXICODONE) 5 MG immediate release tablet; 1-2 tablets every 4-6 hrs as needed for pain  Please fill on or after 03/09/15  I have discontinued Mr. Wight aspirin, oxyCODONE, fentaNYL, oxyCODONE, and fentaNYL. I have also changed his fentaNYL and oxyCODONE. Additionally, I am having him maintain his Probiotic Product (ALIGN PO), vitamin B-12, finasteride, silodosin, Vitamin D-3, celecoxib, LORazepam, docusate sodium, acetaminophen, bisacodyl, baclofen, temazepam, lovastatin, buPROPion, DULoxetine, losartan, aspirin EC, and diazepam.  Meds ordered this encounter  Medications  . aspirin EC 81 MG tablet    Sig: Take 81 mg by mouth daily.  Marland Kitchen DISCONTD: oxyCODONE (OXY IR/ROXICODONE) 5 MG immediate release tablet    Sig: 1-2 tablets every 4-6 hrs as needed for pain  Please fill on or after 01/07/15    Dispense:  120 tablet    Refill:  0  . DISCONTD: fentaNYL (DURAGESIC) 25 MCG/HR patch    Sig: Place 1 patch (25 mcg total) onto the skin every other day. Please fill on or after 01/07/15    Dispense:  15 patch    Refill:  0  . diazepam (VALIUM) 5 MG tablet    Sig: Take 1 tablet (5 mg total) by mouth every 6 (six) hours as needed for anxiety.    Dispense:  120 tablet    Refill:  3  . DISCONTD: oxyCODONE (OXY IR/ROXICODONE) 5 MG immediate release tablet    Sig: 1-2 tablets every 4-6 hrs as needed for pain  Please fill on or after 02/07/15    Dispense:  120 tablet    Refill:  0  . DISCONTD: fentaNYL (DURAGESIC) 25 MCG/HR patch    Sig: Place 1 patch (25 mcg total) onto the skin every other day. Please fill on or after 02/07/15    Dispense:  15  patch    Refill:  0  . fentaNYL (DURAGESIC) 25 MCG/HR patch    Sig: Place 1 patch (25 mcg total) onto the skin every other day.  Please fill on or after 03/09/15    Dispense:  15 patch    Refill:  0  . oxyCODONE (OXY IR/ROXICODONE) 5 MG immediate release tablet    Sig: 1-2 tablets every 4-6 hrs as needed for pain  Please fill on or after 03/09/15    Dispense:  120 tablet    Refill:  0     Follow-up: Return in about 3 months (around 04/08/2015) for a follow-up visit.  Walker Kehr, MD

## 2015-01-28 ENCOUNTER — Telehealth: Payer: Self-pay

## 2015-01-28 NOTE — Telephone Encounter (Signed)
PA for duloxetine received. Started via cover my meds.   [name]

## 2015-02-23 ENCOUNTER — Telehealth: Payer: Self-pay

## 2015-02-23 NOTE — Telephone Encounter (Signed)
PA initiated via covermymeds. [id number]

## 2015-02-23 NOTE — Telephone Encounter (Signed)
Spoke to pt and PA was approved in September 2016 via phone notes in September.  Pt confirmed that he is not having issues getting his prescription and he will contact pharmacy regarding.

## 2015-03-02 ENCOUNTER — Other Ambulatory Visit: Payer: Self-pay | Admitting: Internal Medicine

## 2015-03-02 NOTE — Telephone Encounter (Signed)
Ok to refill 

## 2015-03-05 ENCOUNTER — Other Ambulatory Visit: Payer: Self-pay

## 2015-03-05 MED ORDER — BUPROPION HCL ER (XL) 150 MG PO TB24: 300.0000 mg | ORAL_TABLET | Freq: Every day | ORAL | Status: DC

## 2015-04-07 ENCOUNTER — Encounter: Payer: Self-pay | Admitting: Internal Medicine

## 2015-04-07 ENCOUNTER — Ambulatory Visit (INDEPENDENT_AMBULATORY_CARE_PROVIDER_SITE_OTHER): Payer: BLUE CROSS/BLUE SHIELD | Admitting: Internal Medicine

## 2015-04-07 VITALS — BP 130/90 | HR 75 | Wt 204.0 lb

## 2015-04-07 DIAGNOSIS — E559 Vitamin D deficiency, unspecified: Secondary | ICD-10-CM | POA: Diagnosis not present

## 2015-04-07 DIAGNOSIS — I10 Essential (primary) hypertension: Secondary | ICD-10-CM | POA: Diagnosis not present

## 2015-04-07 DIAGNOSIS — E538 Deficiency of other specified B group vitamins: Secondary | ICD-10-CM | POA: Diagnosis not present

## 2015-04-07 MED ORDER — DIAZEPAM 5 MG PO TABS: 5.0000 mg | ORAL_TABLET | Freq: Four times a day (QID) | ORAL | Status: DC | PRN

## 2015-04-07 MED ORDER — FENTANYL 25 MCG/HR TD PT72: 25.0000 ug | MEDICATED_PATCH | TRANSDERMAL | Status: DC

## 2015-04-07 MED ORDER — DULOXETINE HCL 60 MG PO CPEP: 60.0000 mg | ORAL_CAPSULE | Freq: Every day | ORAL | Status: DC

## 2015-04-07 MED ORDER — BACLOFEN 20 MG PO TABS: 20.0000 mg | ORAL_TABLET | Freq: Three times a day (TID) | ORAL | Status: DC

## 2015-04-07 MED ORDER — OXYCODONE HCL 5 MG PO TABS: ORAL_TABLET | ORAL | Status: DC

## 2015-04-07 MED ORDER — TEMAZEPAM 30 MG PO CAPS: 30.0000 mg | ORAL_CAPSULE | Freq: Every evening | ORAL | Status: DC | PRN

## 2015-04-07 NOTE — Assessment & Plan Note (Signed)
On Losartan 

## 2015-04-07 NOTE — Progress Notes (Signed)
Subjective:  Patient ID: Lee Owens, male    DOB: Aug 07, 1950  Age: 65 y.o. MRN: [id number]  CC: No chief complaint on file.   HPI Lee Owens presents for chronic pain, spastic muscles, depression f/u. Mother passed before thanksgiving  Outpatient Prescriptions Prior to Visit  Medication Sig Dispense Refill  . acetaminophen (TYLENOL) 325 MG tablet Take 2 tablets (650 mg total) by mouth every 6 (six) hours as needed for mild pain (or Fever >/= 101).    . bisacodyl (DULCOLAX) 5 MG EC tablet Take 2 tablets every night with dinner until bowel movement.  LAXITIVE.  Restart if two days since last bowel movement 30 tablet 0  . buPROPion (WELLBUTRIN XL) 150 MG 24 hr tablet Take 2 tablets (300 mg total) by mouth daily. 180 tablet 2  . celecoxib (CELEBREX) 200 MG capsule Take 1 capsule (200 mg total) by mouth daily. 30 capsule 3  . Cholecalciferol (VITAMIN D-3) 5000 UNITS TABS Take 5,000 Units by mouth daily.    Marland Kitchen docusate sodium (COLACE) 100 MG capsule Take 100 mg by mouth daily as needed for mild constipation.    . finasteride (PROSCAR) 5 MG tablet Take 5 mg by mouth daily.    Marland Kitchen LORazepam (ATIVAN) 1 MG tablet Take 1 tablet (1 mg total) by mouth every 8 (eight) hours as needed for anxiety. 60 tablet 0  . losartan (COZAAR) 100 MG tablet TAKE 1 TABLET BY MOUTH DAILY 90 tablet 1  . lovastatin (MEVACOR) 40 MG tablet Take 1 tablet (40 mg total) by mouth at bedtime. 90 tablet 3  . Probiotic Product (ALIGN PO) Take 1 capsule by mouth daily.      . silodosin (RAPAFLO) 8 MG CAPS capsule Take 8 mg by mouth as needed (to relieve urinary symptoms).     . vitamin B-12 (CYANOCOBALAMIN) 1000 MCG tablet Take 2,000 mcg by mouth daily.     . baclofen (LIORESAL) 20 MG tablet TAKE 1 TABLET BY MOUTH THREE TIMES DAILY 270 tablet 2  . diazepam (VALIUM) 5 MG tablet Take 1 tablet (5 mg total) by mouth every 6 (six) hours as needed for anxiety. 120 tablet 3  . DULoxetine (CYMBALTA) 60 MG capsule Take 1 capsule (60 mg  total) by mouth daily. 90 capsule 3  . fentaNYL (DURAGESIC) 25 MCG/HR patch Place 1 patch (25 mcg total) onto the skin every other day. Please fill on or after 03/09/15 15 patch 0  . oxyCODONE (OXY IR/ROXICODONE) 5 MG immediate release tablet 1-2 tablets every 4-6 hrs as needed for pain  Please fill on or after 03/09/15 120 tablet 0  . temazepam (RESTORIL) 30 MG capsule Take 1-2 capsules (30-60 mg total) by mouth at bedtime as needed for sleep. 30 capsule 5  . aspirin EC 81 MG tablet Take 81 mg by mouth daily. Reported on 04/07/2015     No facility-administered medications prior to visit.    ROS Review of Systems  Constitutional: Positive for fatigue. Negative for appetite change and unexpected weight change.  HENT: Negative for congestion, nosebleeds, sneezing, sore throat and trouble swallowing.   Eyes: Negative for itching and visual disturbance.  Respiratory: Negative for cough.   Cardiovascular: Negative for chest pain, palpitations and leg swelling.  Gastrointestinal: Negative for nausea, diarrhea, blood in stool and abdominal distention.  Genitourinary: Negative for frequency and hematuria.  Musculoskeletal: Positive for myalgias, back pain, arthralgias, neck pain and neck stiffness. Negative for joint swelling and gait problem.  Skin: Negative for rash.  Neurological:  Negative for dizziness, tremors, speech difficulty and weakness.  Psychiatric/Behavioral: Negative for suicidal ideas, sleep disturbance, dysphoric mood and agitation. The patient is nervous/anxious.     Objective:  BP 130/90 mmHg  Pulse 75  Wt 204 lb (92.534 kg)  SpO2 96%  BP Readings from Last 3 Encounters:  04/07/15 130/90  01/06/15 120/86  10/07/14 120/82    Wt Readings from Last 3 Encounters:  04/07/15 204 lb (92.534 kg)  01/06/15 210 lb (95.255 kg)  10/07/14 205 lb (92.987 kg)    Physical Exam  Constitutional: He is oriented to person, place, and time. He appears well-developed. No distress.    NAD  HENT:  Mouth/Throat: Oropharynx is clear and moist. No oropharyngeal exudate.  Eyes: Conjunctivae are normal. Pupils are equal, round, and reactive to light.  Neck: Normal range of motion. No JVD present. No thyromegaly present.  Cardiovascular: Normal rate, regular rhythm, normal heart sounds and intact distal pulses.  Exam reveals no gallop and no friction rub.   No murmur heard. Pulmonary/Chest: Effort normal and breath sounds normal. No respiratory distress. He has no wheezes. He has no rales. He exhibits no tenderness.  Abdominal: Soft. Bowel sounds are normal. He exhibits no distension and no mass. There is no tenderness. There is no rebound and no guarding.  Musculoskeletal: Normal range of motion. He exhibits tenderness. He exhibits no edema.  Lymphadenopathy:    He has no cervical adenopathy.  Neurological: He is alert and oriented to person, place, and time. He has normal reflexes. No cranial nerve deficit. He exhibits normal muscle tone. He displays a negative Romberg sign. Coordination and gait normal.  Skin: Skin is warm and dry. No rash noted.  Psychiatric: He has a normal mood and affect. Judgment and thought content normal.    Lab Results  Component Value Date   WBC 7.4 03/30/2014   HGB 15.1 03/30/2014   HCT 44.8 03/30/2014   PLT 263.0 03/30/2014   GLUCOSE 97 03/30/2014   CHOL 184 03/30/2014   TRIG 149.0 03/30/2014   HDL 59.30 03/30/2014   LDLCALC 95 03/30/2014   ALT 22 03/30/2014   AST 17 03/30/2014   NA 142 03/30/2014   K 4.9 03/30/2014   CL 105 03/30/2014   CREATININE 0.9 03/30/2014   BUN 14 03/30/2014   CO2 29 03/30/2014   TSH 1.45 03/30/2014   PSA 1.10 03/30/2014   INR 0.96 06/30/2013   HGBA1C 5.6 01/15/2009    Mm Digital Diagnostic Bilat  01/21/2014  CLINICAL DATA:  Palpable right breast mass, 65 year old male. History of a recent fall with trauma to the right chest wall. The patient takes multiple medications including finasteride, which has  been associated with gynecomastia. EXAM: DIGITAL DIAGNOSTIC  bilateral MAMMOGRAM WITH CAD COMPARISON:  None. ACR Breast Density Category a: The breast tissue is almost entirely fatty. FINDINGS: Right greater than left benign appearing retroareolar gynecomastia is identified without suspicious imaging feature. Mammographic images were processed with CAD. IMPRESSION: Right remote left mild gynecomastia. Clinical followup is suggested. Of the patient's medications provided on a list he brought with him today, finasteride is most likely to be associated with gynecomastia. RECOMMENDATION: Clinical followup I have discussed the findings and recommendations with the patient. Results were also provided in writing at the conclusion of the visit. If applicable, a reminder letter will be sent to the patient regarding the next appointment. BI-RADS CATEGORY  1: Negative. Electronically Signed   By: Conchita Paris M.D.   On: 01/21/2014 14:50  Assessment & Plan:   Diagnoses and all orders for this visit:  Essential hypertension  Vitamin B12 deficiency  Other orders -     baclofen (LIORESAL) 20 MG tablet; Take 1 tablet (20 mg total) by mouth 3 (three) times daily. -     DULoxetine (CYMBALTA) 60 MG capsule; Take 1 capsule (60 mg total) by mouth daily. -     Discontinue: fentaNYL (DURAGESIC) 25 MCG/HR patch; Place 1 patch (25 mcg total) onto the skin every other day. Please fill on or after 04/09/15 -     diazepam (VALIUM) 5 MG tablet; Take 1 tablet (5 mg total) by mouth every 6 (six) hours as needed for anxiety. -     Discontinue: oxyCODONE (OXY IR/ROXICODONE) 5 MG immediate release tablet; 1-2 tablets every 4-6 hrs as needed for pain  Please fill on or after 04/09/15 -     temazepam (RESTORIL) 30 MG capsule; Take 1-2 capsules (30-60 mg total) by mouth at bedtime as needed for sleep. -     Discontinue: oxyCODONE (OXY IR/ROXICODONE) 5 MG immediate release tablet; 1-2 tablets every 4-6 hrs as needed for  pain  Please fill on or after 05/10/15 -     Discontinue: fentaNYL (DURAGESIC) 25 MCG/HR patch; Place 1 patch (25 mcg total) onto the skin every other day. Please fill on or after 05/10/15 -     oxyCODONE (OXY IR/ROXICODONE) 5 MG immediate release tablet; 1-2 tablets every 4-6 hrs as needed for pain  Please fill on or after 06/07/15 -     fentaNYL (DURAGESIC) 25 MCG/HR patch; Place 1 patch (25 mcg total) onto the skin every other day. Please fill on or after 06/07/15  I have discontinued Mr. Tenenbaum's fentaNYL, oxyCODONE, fentaNYL, and oxyCODONE. I have also changed his baclofen, oxyCODONE, and fentaNYL. Additionally, I am having him maintain his Probiotic Product (ALIGN PO), vitamin B-12, finasteride, silodosin, Vitamin D-3, celecoxib, LORazepam, docusate sodium, acetaminophen, bisacodyl, lovastatin, losartan, aspirin EC, buPROPion, DULoxetine, diazepam, and temazepam.  Meds ordered this encounter  Medications  . baclofen (LIORESAL) 20 MG tablet    Sig: Take 1 tablet (20 mg total) by mouth 3 (three) times daily.    Dispense:  270 tablet    Refill:  2  . DULoxetine (CYMBALTA) 60 MG capsule    Sig: Take 1 capsule (60 mg total) by mouth daily.    Dispense:  90 capsule    Refill:  3  . DISCONTD: fentaNYL (DURAGESIC) 25 MCG/HR patch    Sig: Place 1 patch (25 mcg total) onto the skin every other day. Please fill on or after 04/09/15    Dispense:  15 patch    Refill:  0  . diazepam (VALIUM) 5 MG tablet    Sig: Take 1 tablet (5 mg total) by mouth every 6 (six) hours as needed for anxiety.    Dispense:  120 tablet    Refill:  3  . DISCONTD: oxyCODONE (OXY IR/ROXICODONE) 5 MG immediate release tablet    Sig: 1-2 tablets every 4-6 hrs as needed for pain  Please fill on or after 04/09/15    Dispense:  120 tablet    Refill:  0  . temazepam (RESTORIL) 30 MG capsule    Sig: Take 1-2 capsules (30-60 mg total) by mouth at bedtime as needed for sleep.    Dispense:  30 capsule    Refill:  5  .  DISCONTD: oxyCODONE (OXY IR/ROXICODONE) 5 MG immediate release tablet    Sig: 1-2 tablets  every 4-6 hrs as needed for pain  Please fill on or after 05/10/15    Dispense:  120 tablet    Refill:  0  . DISCONTD: fentaNYL (DURAGESIC) 25 MCG/HR patch    Sig: Place 1 patch (25 mcg total) onto the skin every other day. Please fill on or after 05/10/15    Dispense:  15 patch    Refill:  0  . oxyCODONE (OXY IR/ROXICODONE) 5 MG immediate release tablet    Sig: 1-2 tablets every 4-6 hrs as needed for pain  Please fill on or after 06/07/15    Dispense:  120 tablet    Refill:  0  . fentaNYL (DURAGESIC) 25 MCG/HR patch    Sig: Place 1 patch (25 mcg total) onto the skin every other day. Please fill on or after 06/07/15    Dispense:  15 patch    Refill:  0     Follow-up: Return in about 3 months (around 07/06/2015) for a follow-up visit.  Walker Kehr, MD

## 2015-04-07 NOTE — Progress Notes (Signed)
Pre visit review using our clinic review tool, if applicable. No additional management support is needed unless otherwise documented below in the visit note. 

## 2015-04-07 NOTE — Assessment & Plan Note (Signed)
On Vit D 

## 2015-04-07 NOTE — Assessment & Plan Note (Signed)
On B12 

## 2015-05-18 ENCOUNTER — Other Ambulatory Visit: Payer: Self-pay | Admitting: General Practice

## 2015-05-18 ENCOUNTER — Telehealth: Payer: Self-pay | Admitting: General Practice

## 2015-05-18 NOTE — Telephone Encounter (Signed)
[facility] is requesting a re-fill for Diazepam, 5mg  tabs.  Please advise.

## 2015-05-18 NOTE — Telephone Encounter (Signed)
Pharmacist at [facility] says that pt does have 3 re-fills and they will stop the re-fill requests from being sent to the office.

## 2015-07-06 ENCOUNTER — Telehealth: Payer: Self-pay

## 2015-07-06 NOTE — Telephone Encounter (Signed)
Recd faxed rx request for baclofen 20mg  tabs from [facility] on [address]----please advise, thanks

## 2015-07-06 NOTE — Telephone Encounter (Signed)
OK to fill this prescription with additional refills x3 Thank you!  

## 2015-07-07 ENCOUNTER — Ambulatory Visit (INDEPENDENT_AMBULATORY_CARE_PROVIDER_SITE_OTHER): Payer: BLUE CROSS/BLUE SHIELD | Admitting: Internal Medicine

## 2015-07-07 ENCOUNTER — Encounter: Payer: Self-pay | Admitting: Internal Medicine

## 2015-07-07 ENCOUNTER — Other Ambulatory Visit: Payer: Self-pay

## 2015-07-07 VITALS — BP 118/80 | HR 74 | Wt 210.0 lb

## 2015-07-07 DIAGNOSIS — Z0000 Encounter for general adult medical examination without abnormal findings: Secondary | ICD-10-CM

## 2015-07-07 DIAGNOSIS — I10 Essential (primary) hypertension: Secondary | ICD-10-CM

## 2015-07-07 DIAGNOSIS — IMO0001 Reserved for inherently not codable concepts without codable children: Secondary | ICD-10-CM

## 2015-07-07 DIAGNOSIS — M791 Myalgia: Secondary | ICD-10-CM

## 2015-07-07 DIAGNOSIS — M609 Myositis, unspecified: Secondary | ICD-10-CM

## 2015-07-07 DIAGNOSIS — E559 Vitamin D deficiency, unspecified: Secondary | ICD-10-CM | POA: Diagnosis not present

## 2015-07-07 DIAGNOSIS — F32A Depression, unspecified: Secondary | ICD-10-CM

## 2015-07-07 DIAGNOSIS — F329 Major depressive disorder, single episode, unspecified: Secondary | ICD-10-CM

## 2015-07-07 DIAGNOSIS — E538 Deficiency of other specified B group vitamins: Secondary | ICD-10-CM

## 2015-07-07 MED ORDER — OXYCODONE HCL 5 MG PO TABS: ORAL_TABLET | ORAL | Status: DC

## 2015-07-07 MED ORDER — FENTANYL 25 MCG/HR TD PT72: 25.0000 ug | MEDICATED_PATCH | TRANSDERMAL | Status: DC

## 2015-07-07 MED ORDER — BACLOFEN 20 MG PO TABS: 20.0000 mg | ORAL_TABLET | Freq: Three times a day (TID) | ORAL | Status: DC

## 2015-07-07 NOTE — Assessment & Plan Note (Signed)
Cymbalta 

## 2015-07-07 NOTE — Assessment & Plan Note (Signed)
Duragesic, Oxy IR  Potential benefits of a long term opioids use as well as potential risks (i.e. addiction risk, apnea etc) and complications (i.e. Somnolence, constipation and others) were explained to the patient and were aknowledged.  

## 2015-07-07 NOTE — Progress Notes (Signed)
Pre visit review using our clinic review tool, if applicable. No additional management support is needed unless otherwise documented below in the visit note. 

## 2015-07-07 NOTE — Telephone Encounter (Signed)
Refill sent to [facility]  

## 2015-07-07 NOTE — Assessment & Plan Note (Signed)
Chronic  On Losartan 

## 2015-07-07 NOTE — Assessment & Plan Note (Signed)
On Vit D 

## 2015-07-07 NOTE — Assessment & Plan Note (Signed)
On B12 

## 2015-07-07 NOTE — Progress Notes (Signed)
Subjective:  Patient ID: Lee Owens, male    DOB: [birth year]  Age: 65 y.o. MRN: [id number]  CC: No chief complaint on file.   HPI Lee Owens presents for LBP, myalgias, HTN f/u  Outpatient Prescriptions Prior to Visit  Medication Sig Dispense Refill  . acetaminophen (TYLENOL) 325 MG tablet Take 2 tablets (650 mg total) by mouth every 6 (six) hours as needed for mild pain (or Fever >/= 101).    Marland Kitchen aspirin EC 81 MG tablet Take 81 mg by mouth daily. Reported on 04/07/2015    . baclofen (LIORESAL) 20 MG tablet Take 1 tablet (20 mg total) by mouth 3 (three) times daily. 270 tablet 3  . bisacodyl (DULCOLAX) 5 MG EC tablet Take 2 tablets every night with dinner until bowel movement.  LAXITIVE.  Restart if two days since last bowel movement 30 tablet 0  . buPROPion (WELLBUTRIN XL) 150 MG 24 hr tablet Take 2 tablets (300 mg total) by mouth daily. 180 tablet 2  . celecoxib (CELEBREX) 200 MG capsule Take 1 capsule (200 mg total) by mouth daily. 30 capsule 3  . Cholecalciferol (VITAMIN D-3) 5000 UNITS TABS Take 5,000 Units by mouth daily.    . diazepam (VALIUM) 5 MG tablet Take 1 tablet (5 mg total) by mouth every 6 (six) hours as needed for anxiety. 120 tablet 3  . docusate sodium (COLACE) 100 MG capsule Take 100 mg by mouth daily as needed for mild constipation.    . DULoxetine (CYMBALTA) 60 MG capsule Take 1 capsule (60 mg total) by mouth daily. 90 capsule 3  . finasteride (PROSCAR) 5 MG tablet Take 5 mg by mouth daily.    Marland Kitchen LORazepam (ATIVAN) 1 MG tablet Take 1 tablet (1 mg total) by mouth every 8 (eight) hours as needed for anxiety. 60 tablet 0  . losartan (COZAAR) 100 MG tablet TAKE 1 TABLET BY MOUTH DAILY 90 tablet 1  . lovastatin (MEVACOR) 40 MG tablet Take 1 tablet (40 mg total) by mouth at bedtime. 90 tablet 3  . Probiotic Product (ALIGN PO) Take 1 capsule by mouth daily.      . silodosin (RAPAFLO) 8 MG CAPS capsule Take 8 mg by mouth as needed (to relieve urinary symptoms).     .  temazepam (RESTORIL) 30 MG capsule Take 1-2 capsules (30-60 mg total) by mouth at bedtime as needed for sleep. 30 capsule 5  . vitamin B-12 (CYANOCOBALAMIN) 1000 MCG tablet Take 2,000 mcg by mouth daily.     . fentaNYL (DURAGESIC) 25 MCG/HR patch Place 1 patch (25 mcg total) onto the skin every other day. Please fill on or after 06/07/15 15 patch 0  . oxyCODONE (OXY IR/ROXICODONE) 5 MG immediate release tablet 1-2 tablets every 4-6 hrs as needed for pain  Please fill on or after 06/07/15 120 tablet 0   No facility-administered medications prior to visit.    ROS Review of Systems  Constitutional: Negative for appetite change, fatigue and unexpected weight change.  HENT: Negative for congestion, nosebleeds, sneezing, sore throat and trouble swallowing.   Eyes: Negative for itching and visual disturbance.  Respiratory: Negative for cough.   Cardiovascular: Negative for chest pain, palpitations and leg swelling.  Gastrointestinal: Negative for nausea, diarrhea, blood in stool and abdominal distention.  Genitourinary: Negative for frequency and hematuria.  Musculoskeletal: Negative for back pain, joint swelling, gait problem and neck pain.  Skin: Negative for rash.  Neurological: Negative for dizziness, tremors, speech difficulty and weakness.  Psychiatric/Behavioral: Negative  for suicidal ideas, sleep disturbance, dysphoric mood and agitation. The patient is not nervous/anxious.     Objective:  BP 118/80 mmHg  Pulse 74  Wt 210 lb (95.255 kg)  SpO2 95%  BP Readings from Last 3 Encounters:  07/07/15 118/80  04/07/15 130/90  01/06/15 120/86    Wt Readings from Last 3 Encounters:  07/07/15 210 lb (95.255 kg)  04/07/15 204 lb (92.534 kg)  01/06/15 210 lb (95.255 kg)    Physical Exam  Constitutional: He is oriented to person, place, and time. He appears well-developed. No distress.  NAD  HENT:  Mouth/Throat: Oropharynx is clear and moist.  Eyes: Conjunctivae are normal. Pupils are  equal, round, and reactive to light.  Neck: Normal range of motion. No JVD present. No thyromegaly present.  Cardiovascular: Normal rate, regular rhythm, normal heart sounds and intact distal pulses.  Exam reveals no gallop and no friction rub.   No murmur heard. Pulmonary/Chest: Effort normal and breath sounds normal. No respiratory distress. He has no wheezes. He has no rales. He exhibits no tenderness.  Abdominal: Soft. Bowel sounds are normal. He exhibits no distension and no mass. There is no tenderness. There is no rebound and no guarding.  Musculoskeletal: Normal range of motion. He exhibits no edema or tenderness.  Lymphadenopathy:    He has no cervical adenopathy.  Neurological: He is alert and oriented to person, place, and time. He has normal reflexes. No cranial nerve deficit. He exhibits normal muscle tone. He displays a negative Romberg sign. Coordination and gait normal.  Skin: Skin is warm and dry. No rash noted.  Psychiatric: He has a normal mood and affect. His behavior is normal. Judgment and thought content normal.    Lab Results  Component Value Date   WBC 7.4 03/30/2014   HGB 15.1 03/30/2014   HCT 44.8 03/30/2014   PLT 263.0 03/30/2014   GLUCOSE 97 03/30/2014   CHOL 184 03/30/2014   TRIG 149.0 03/30/2014   HDL 59.30 03/30/2014   LDLCALC 95 03/30/2014   ALT 22 03/30/2014   AST 17 03/30/2014   NA 142 03/30/2014   K 4.9 03/30/2014   CL 105 03/30/2014   CREATININE 0.9 03/30/2014   BUN 14 03/30/2014   CO2 29 03/30/2014   TSH 1.45 03/30/2014   PSA 1.10 03/30/2014   INR 0.96 06/30/2013   HGBA1C 5.6 01/15/2009    Mm Digital Diagnostic Bilat  01/21/2014  CLINICAL DATA:  Palpable right breast mass, 65 year old male. History of a recent fall with trauma to the right chest wall. The patient takes multiple medications including finasteride, which has been associated with gynecomastia. EXAM: DIGITAL DIAGNOSTIC  bilateral MAMMOGRAM WITH CAD COMPARISON:  None. ACR  Breast Density Category a: The breast tissue is almost entirely fatty. FINDINGS: Right greater than left benign appearing retroareolar gynecomastia is identified without suspicious imaging feature. Mammographic images were processed with CAD. IMPRESSION: Right remote left mild gynecomastia. Clinical followup is suggested. Of the patient's medications provided on a list he brought with him today, finasteride is most likely to be associated with gynecomastia. RECOMMENDATION: Clinical followup I have discussed the findings and recommendations with the patient. Results were also provided in writing at the conclusion of the visit. If applicable, a reminder letter will be sent to the patient regarding the next appointment. BI-RADS CATEGORY  1: Negative. Electronically Signed   By: Conchita Paris M.D.   On: 01/21/2014 14:50    Assessment & Plan:   There are no diagnoses  linked to this encounter. I have discontinued Mr. Naba oxyCODONE, fentaNYL, fentaNYL, and oxyCODONE. I have also changed his fentaNYL and oxyCODONE. Additionally, I am having him maintain his Probiotic Product (ALIGN PO), vitamin B-12, finasteride, silodosin, Vitamin D-3, celecoxib, LORazepam, docusate sodium, acetaminophen, bisacodyl, lovastatin, losartan, aspirin EC, buPROPion, DULoxetine, diazepam, temazepam, and baclofen.  Meds ordered this encounter  Medications  . DISCONTD: fentaNYL (DURAGESIC) 25 MCG/HR patch    Sig: Place 1 patch (25 mcg total) onto the skin every other day. Please fill on or after 07/08/15    Dispense:  15 patch    Refill:  0  . DISCONTD: oxyCODONE (OXY IR/ROXICODONE) 5 MG immediate release tablet    Sig: 1-2 tablets every 4-6 hrs as needed for pain  Please fill on or after 07/08/15    Dispense:  120 tablet    Refill:  0  . DISCONTD: fentaNYL (DURAGESIC) 25 MCG/HR patch    Sig: Place 1 patch (25 mcg total) onto the skin every other day. Please fill on or after 08/07/15    Dispense:  15 patch    Refill:  0   . DISCONTD: oxyCODONE (OXY IR/ROXICODONE) 5 MG immediate release tablet    Sig: 1-2 tablets every 4-6 hrs as needed for pain  Please fill on or after 08/07/15    Dispense:  120 tablet    Refill:  0  . fentaNYL (DURAGESIC) 25 MCG/HR patch    Sig: Place 1 patch (25 mcg total) onto the skin every other day. Please fill on or after 09/07/15    Dispense:  15 patch    Refill:  0  . oxyCODONE (OXY IR/ROXICODONE) 5 MG immediate release tablet    Sig: 1-2 tablets every 4-6 hrs as needed for pain  Please fill on or after 09/07/15    Dispense:  120 tablet    Refill:  0     Follow-up: No Follow-up on file.  Walker Kehr, MD

## 2015-07-23 ENCOUNTER — Other Ambulatory Visit: Payer: Self-pay | Admitting: Internal Medicine

## 2015-08-07 ENCOUNTER — Other Ambulatory Visit: Payer: Self-pay | Admitting: Internal Medicine

## 2015-09-30 ENCOUNTER — Other Ambulatory Visit (INDEPENDENT_AMBULATORY_CARE_PROVIDER_SITE_OTHER): Payer: BLUE CROSS/BLUE SHIELD

## 2015-09-30 DIAGNOSIS — M609 Myositis, unspecified: Secondary | ICD-10-CM | POA: Diagnosis not present

## 2015-09-30 DIAGNOSIS — I10 Essential (primary) hypertension: Secondary | ICD-10-CM

## 2015-09-30 DIAGNOSIS — Z0000 Encounter for general adult medical examination without abnormal findings: Secondary | ICD-10-CM | POA: Diagnosis not present

## 2015-09-30 DIAGNOSIS — M791 Myalgia: Secondary | ICD-10-CM | POA: Diagnosis not present

## 2015-09-30 DIAGNOSIS — IMO0001 Reserved for inherently not codable concepts without codable children: Secondary | ICD-10-CM

## 2015-09-30 LAB — PSA: PSA: 1.01 ng/mL (ref 0.10–4.00)

## 2015-09-30 LAB — URINALYSIS, ROUTINE W REFLEX MICROSCOPIC
BILIRUBIN URINE: NEGATIVE
KETONES UR: NEGATIVE
LEUKOCYTES UA: NEGATIVE
NITRITE: NEGATIVE
Specific Gravity, Urine: 1.01 (ref 1.000–1.030)
TOTAL PROTEIN, URINE-UPE24: NEGATIVE
URINE GLUCOSE: NEGATIVE
Urobilinogen, UA: 0.2 (ref 0.0–1.0)
WBC, UA: NONE SEEN (ref 0–?)
pH: 6 (ref 5.0–8.0)

## 2015-09-30 LAB — BASIC METABOLIC PANEL
BUN: 7 mg/dL (ref 6–23)
CHLORIDE: 106 meq/L (ref 96–112)
CO2: 30 meq/L (ref 19–32)
Calcium: 9.4 mg/dL (ref 8.4–10.5)
Creatinine, Ser: 0.86 mg/dL (ref 0.40–1.50)
GFR: 95.01 mL/min (ref >60.00)
Glucose, Bld: 102 mg/dL — ABNORMAL HIGH (ref 70–99)
POTASSIUM: 4.5 meq/L (ref 3.5–5.1)
Sodium: 143 mEq/L (ref 135–145)

## 2015-09-30 LAB — CBC WITH DIFFERENTIAL/PLATELET
BASOS PCT: 0.7 % (ref 0.0–3.0)
Basophils Absolute: 0 10*3/uL (ref 0.0–0.1)
EOS PCT: 5 % (ref 0.0–5.0)
Eosinophils Absolute: 0.3 10*3/uL (ref 0.0–0.7)
HEMATOCRIT: 41.3 % (ref 39.0–52.0)
HEMOGLOBIN: 14.1 g/dL (ref 13.0–17.0)
LYMPHS PCT: 29.6 % (ref 12.0–46.0)
Lymphs Abs: 1.7 10*3/uL (ref 0.7–4.0)
MCHC: 34 g/dL (ref 30.0–36.0)
MCV: 91.8 fl (ref 78.0–100.0)
Monocytes Absolute: 0.5 10*3/uL (ref 0.1–1.0)
Monocytes Relative: 9.1 % (ref 3.0–12.0)
NEUTROS ABS: 3.3 10*3/uL (ref 1.4–7.7)
Neutrophils Relative %: 55.6 % (ref 43.0–77.0)
Platelets: 276 10*3/uL (ref 150.0–400.0)
RBC: 4.5 Mil/uL (ref 4.22–5.81)
RDW: 13.3 % (ref 11.5–15.5)
WBC: 5.9 10*3/uL (ref 4.0–10.5)

## 2015-09-30 LAB — HEPATIC FUNCTION PANEL
ALT: 17 U/L (ref 0–53)
AST: 13 U/L (ref 0–37)
Albumin: 4 g/dL (ref 3.5–5.2)
Alkaline Phosphatase: 61 U/L (ref 39–117)
Bilirubin, Direct: 0.1 mg/dL (ref 0.0–0.3)
Total Bilirubin: 0.5 mg/dL (ref 0.2–1.2)
Total Protein: 6.1 g/dL (ref 6.0–8.3)

## 2015-09-30 LAB — TSH: TSH: 1.27 u[IU]/mL (ref 0.35–4.50)

## 2015-09-30 LAB — CK: CK TOTAL: 69 U/L (ref 7–232)

## 2015-10-01 LAB — HEPATITIS C ANTIBODY: HCV Ab: NEGATIVE

## 2015-10-06 ENCOUNTER — Encounter: Payer: Self-pay | Admitting: Internal Medicine

## 2015-10-06 ENCOUNTER — Ambulatory Visit (INDEPENDENT_AMBULATORY_CARE_PROVIDER_SITE_OTHER): Payer: BLUE CROSS/BLUE SHIELD | Admitting: Internal Medicine

## 2015-10-06 VITALS — BP 118/78 | HR 86 | Temp 98.1°F | Resp 16 | Ht 73.0 in | Wt 209.1 lb

## 2015-10-06 DIAGNOSIS — E559 Vitamin D deficiency, unspecified: Secondary | ICD-10-CM

## 2015-10-06 DIAGNOSIS — E538 Deficiency of other specified B group vitamins: Secondary | ICD-10-CM

## 2015-10-06 DIAGNOSIS — F419 Anxiety disorder, unspecified: Secondary | ICD-10-CM | POA: Diagnosis not present

## 2015-10-06 DIAGNOSIS — F329 Major depressive disorder, single episode, unspecified: Secondary | ICD-10-CM

## 2015-10-06 DIAGNOSIS — M609 Myositis, unspecified: Secondary | ICD-10-CM

## 2015-10-06 DIAGNOSIS — M791 Myalgia: Secondary | ICD-10-CM | POA: Diagnosis not present

## 2015-10-06 DIAGNOSIS — F32A Depression, unspecified: Secondary | ICD-10-CM

## 2015-10-06 DIAGNOSIS — D485 Neoplasm of uncertain behavior of skin: Secondary | ICD-10-CM

## 2015-10-06 DIAGNOSIS — IMO0001 Reserved for inherently not codable concepts without codable children: Secondary | ICD-10-CM

## 2015-10-06 MED ORDER — FENTANYL 25 MCG/HR TD PT72: 25.0000 ug | MEDICATED_PATCH | TRANSDERMAL | Status: DC

## 2015-10-06 MED ORDER — OXYCODONE HCL 5 MG PO TABS: ORAL_TABLET | ORAL | Status: DC

## 2015-10-06 MED ORDER — DIAZEPAM 5 MG PO TABS: 5.0000 mg | ORAL_TABLET | Freq: Four times a day (QID) | ORAL | Status: DC | PRN

## 2015-10-06 NOTE — Assessment & Plan Note (Addendum)
Duragesic, Oxy IR  Potential benefits of a long term opioids use as well as potential risks (i.e. addiction risk, apnea etc) and complications (i.e. Somnolence, constipation and others) were explained to the patient and were aknowledged.  Diazepam prn spasms

## 2015-10-06 NOTE — Assessment & Plan Note (Signed)
Diazepam prn  Potential benefits of a long term benzodiazepines  use as well as potential risks  and complications were explained to the patient and were aknowledged. 

## 2015-10-06 NOTE — Assessment & Plan Note (Signed)
Mole on R upper back 11x12 mm Bx offered

## 2015-10-06 NOTE — Progress Notes (Signed)
Subjective:  Patient ID: Lee Owens, male    DOB: 05-16-1950  Age: 65 y.o. MRN: [id number]  CC: No chief complaint on file.   HPI [name] presents for myalgias w/spasms, LBP, anxiety, depression f/u   Outpatient Prescriptions Prior to Visit  Medication Sig Dispense Refill  . aspirin EC 81 MG tablet Take 81 mg by mouth daily. Reported on 04/07/2015    . baclofen (LIORESAL) 20 MG tablet Take 1 tablet (20 mg total) by mouth 3 (three) times daily. 270 tablet 3  . bisacodyl (DULCOLAX) 5 MG EC tablet Take 2 tablets every night with dinner until bowel movement.  LAXITIVE.  Restart if two days since last bowel movement 30 tablet 0  . buPROPion (WELLBUTRIN XL) 150 MG 24 hr tablet Take 2 tablets (300 mg total) by mouth daily. 180 tablet 2  . Cholecalciferol (VITAMIN D-3) 5000 UNITS TABS Take 5,000 Units by mouth daily.    . diazepam (VALIUM) 5 MG tablet Take 1 tablet (5 mg total) by mouth every 6 (six) hours as needed for anxiety. 120 tablet 3  . docusate sodium (COLACE) 100 MG capsule Take 100 mg by mouth daily as needed for mild constipation.    . DULoxetine (CYMBALTA) 60 MG capsule TAKE 1 CAPSULE BY MOUTH TWICE DAILY 180 capsule 3  . fentaNYL (DURAGESIC) 25 MCG/HR patch Place 1 patch (25 mcg total) onto the skin every other day. Please fill on or after 09/07/15 15 patch 0  . finasteride (PROSCAR) 5 MG tablet Take 5 mg by mouth daily.    Marland Kitchen losartan (COZAAR) 100 MG tablet TAKE 1 TABLET BY MOUTH DAILY 90 tablet 1  . lovastatin (MEVACOR) 40 MG tablet Take 1 tablet (40 mg total) by mouth at bedtime. 90 tablet 3  . oxyCODONE (OXY IR/ROXICODONE) 5 MG immediate release tablet 1-2 tablets every 4-6 hrs as needed for pain  Please fill on or after 09/07/15 120 tablet 0  . Probiotic Product (ALIGN PO) Take 1 capsule by mouth daily.      . silodosin (RAPAFLO) 8 MG CAPS capsule Take 8 mg by mouth as needed (to relieve urinary symptoms).     . temazepam (RESTORIL) 30 MG capsule Take 1-2 capsules (30-60  mg total) by mouth at bedtime as needed for sleep. 30 capsule 5  . vitamin B-12 (CYANOCOBALAMIN) 1000 MCG tablet Take 2,000 mcg by mouth daily.     Marland Kitchen acetaminophen (TYLENOL) 325 MG tablet Take 2 tablets (650 mg total) by mouth every 6 (six) hours as needed for mild pain (or Fever >/= 101). (Patient not taking: Reported on 10/06/2015)    . celecoxib (CELEBREX) 200 MG capsule Take 1 capsule (200 mg total) by mouth daily. (Patient not taking: Reported on 10/06/2015) 30 capsule 3  . LORazepam (ATIVAN) 1 MG tablet Take 1 tablet (1 mg total) by mouth every 8 (eight) hours as needed for anxiety. (Patient not taking: Reported on 10/06/2015) 60 tablet 0   No facility-administered medications prior to visit.    ROS Review of Systems  Constitutional: Positive for fatigue. Negative for appetite change and unexpected weight change.  HENT: Negative for congestion, nosebleeds, sneezing, sore throat and trouble swallowing.   Eyes: Negative for itching and visual disturbance.  Respiratory: Negative for cough.   Cardiovascular: Negative for chest pain, palpitations and leg swelling.  Gastrointestinal: Negative for nausea, diarrhea, blood in stool and abdominal distention.  Genitourinary: Negative for frequency and hematuria.  Musculoskeletal: Positive for myalgias, back pain, arthralgias, gait problem,  neck pain and neck stiffness. Negative for joint swelling.  Skin: Negative for rash.  Neurological: Negative for dizziness, tremors, speech difficulty and weakness.  Psychiatric/Behavioral: Negative for sleep disturbance, dysphoric mood, decreased concentration and agitation. The patient is nervous/anxious.     Objective:  BP 118/78 mmHg  Pulse 86  Temp(Src) 98.1 F (36.7 C) (Oral)  Resp 16  Ht 6\' 1"  (1.854 m)  Wt 209 lb 1.9 oz (94.856 kg)  BMI 27.60 kg/m2  SpO2 95%  BP Readings from Last 3 Encounters:  10/06/15 118/78  07/07/15 118/80  04/07/15 130/90    Wt Readings from Last 3 Encounters:    10/06/15 209 lb 1.9 oz (94.856 kg)  07/07/15 210 lb (95.255 kg)  04/07/15 204 lb (92.534 kg)    Physical Exam  Constitutional: He is oriented to person, place, and time. He appears well-developed. No distress.  NAD  HENT:  Mouth/Throat: Oropharynx is clear and moist.  Eyes: Conjunctivae are normal. Pupils are equal, round, and reactive to light.  Neck: Normal range of motion. No JVD present. No thyromegaly present.  Cardiovascular: Normal rate, regular rhythm, normal heart sounds and intact distal pulses.  Exam reveals no gallop and no friction rub.   No murmur heard. Pulmonary/Chest: Effort normal and breath sounds normal. No respiratory distress. He has no wheezes. He has no rales. He exhibits no tenderness.  Abdominal: Soft. Bowel sounds are normal. He exhibits no distension and no mass. There is no tenderness. There is no rebound and no guarding.  Musculoskeletal: Normal range of motion. He exhibits tenderness. He exhibits no edema.  Lymphadenopathy:    He has no cervical adenopathy.  Neurological: He is alert and oriented to person, place, and time. He has normal reflexes. No cranial nerve deficit. He exhibits normal muscle tone. He displays a negative Romberg sign. Coordination and gait normal.  Skin: Skin is warm and dry. No rash noted. No erythema.  Psychiatric: He has a normal mood and affect. His behavior is normal. Judgment and thought content normal.  Tender large muscle groups  Mole on R upper back 11x12 mm  Lab Results  Component Value Date   WBC 5.9 09/30/2015   HGB 14.1 09/30/2015   HCT 41.3 09/30/2015   PLT 276.0 09/30/2015   GLUCOSE 102* 09/30/2015   CHOL 184 03/30/2014   TRIG 149.0 03/30/2014   HDL 59.30 03/30/2014   LDLCALC 95 03/30/2014   ALT 17 09/30/2015   AST 13 09/30/2015   NA 143 09/30/2015   K 4.5 09/30/2015   CL 106 09/30/2015   CREATININE 0.86 09/30/2015   BUN 7 09/30/2015   CO2 30 09/30/2015   TSH 1.27 09/30/2015   PSA 1.01 09/30/2015    INR 0.96 06/30/2013   HGBA1C 5.6 01/15/2009    Mm Digital Diagnostic Bilat  01/21/2014  CLINICAL DATA:  Palpable right breast mass, 65 year old male. History of a recent fall with trauma to the right chest wall. The patient takes multiple medications including finasteride, which has been associated with gynecomastia. EXAM: DIGITAL DIAGNOSTIC  bilateral MAMMOGRAM WITH CAD COMPARISON:  None. ACR Breast Density Category a: The breast tissue is almost entirely fatty. FINDINGS: Right greater than left benign appearing retroareolar gynecomastia is identified without suspicious imaging feature. Mammographic images were processed with CAD. IMPRESSION: Right remote left mild gynecomastia. Clinical followup is suggested. Of the patient's medications provided on a list he brought with him today, finasteride is most likely to be associated with gynecomastia. RECOMMENDATION: Clinical followup I have discussed  the findings and recommendations with the patient. Results were also provided in writing at the conclusion of the visit. If applicable, a reminder letter will be sent to the patient regarding the next appointment. BI-RADS CATEGORY  1: Negative. Electronically Signed   By: Conchita Paris M.D.   On: 01/21/2014 14:50    Assessment & Plan:   There are no diagnoses linked to this encounter. I am having Mr. Keath maintain his Probiotic Product (ALIGN PO), vitamin B-12, finasteride, silodosin, Vitamin D-3, celecoxib, LORazepam, docusate sodium, acetaminophen, bisacodyl, lovastatin, aspirin EC, buPROPion, diazepam, temazepam, baclofen, fentaNYL, oxyCODONE, DULoxetine, and losartan.  No orders of the defined types were placed in this encounter.     Follow-up: No Follow-up on file.  Walker Kehr, MD

## 2015-10-06 NOTE — Assessment & Plan Note (Signed)
On Cymbalta, Wellbutrin

## 2015-10-06 NOTE — Progress Notes (Signed)
Pre visit review using our clinic review tool, if applicable. No additional management support is needed unless otherwise documented below in the visit note. 

## 2015-10-06 NOTE — Assessment & Plan Note (Signed)
On Vit D 

## 2015-10-06 NOTE — Assessment & Plan Note (Signed)
On B12 

## 2015-10-18 ENCOUNTER — Other Ambulatory Visit: Payer: Self-pay | Admitting: Internal Medicine

## 2015-10-19 NOTE — Telephone Encounter (Signed)
Done

## 2015-11-05 ENCOUNTER — Telehealth: Payer: Self-pay | Admitting: Emergency Medicine

## 2015-11-05 MED ORDER — LOVASTATIN 40 MG PO TABS: 40.0000 mg | ORAL_TABLET | Freq: Every day | ORAL | 3 refills | Status: DC

## 2015-11-05 NOTE — Telephone Encounter (Signed)
Pt needs a prescription refill on lovastatin (MEVACOR) 40 MG tablet. Pharmacy is [facility]. Please follow up thanks.

## 2015-11-05 NOTE — Telephone Encounter (Signed)
Done. See meds.  

## 2015-11-09 ENCOUNTER — Telehealth: Payer: Self-pay | Admitting: Internal Medicine

## 2015-11-09 NOTE — Telephone Encounter (Signed)
Call Id: [id number] [facility] - Client [place] Patient Name: Lee Owens DOB: September 26, 1950 Initial Comment Caller states he is having blood in his urine. Nurse Assessment Nurse: Markus Daft, RN, Sherre Poot Date/Time (Eastern Time): 11/09/2015 3:44:49 PM Confirm and document reason for call. If symptomatic, describe symptoms. You must click the next button to save text entered. 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 He notices in first void of the morning. He is not having pain with urination and no flank pain. - Started in last 2 wks. as he returned from a long flight where he was drinking a lot of Pepsi. h/o BPH so urination can be difficult. - Had seen his MD in f/u pain mgmt, urinalysis done first week of July and it was ok. PSA was ok. Has the patient traveled out of the country within the last 30 days? ---Not Applicable Does the patient have any new or worsening symptoms? ---Yes Will a triage be completed? ---Yes Related visit to physician within the last 2 weeks? ---No Does the PT have any chronic conditions? (i.e. diabetes, asthma, etc.) ---Yes List chronic conditions. 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 Is this a behavioral health or substance abuse call? ---No Guidelines Guideline Title Affirmed Question Affirmed Notes Urine - Blood In Blood in urine, but all triage questions negative (Exception: could be normal menstrual bleeding) Final Disposition User See Physician within [place], RN, [place] Comments Warm conf. to office for appt. within 24 hrs. There was nothing available with him til end of August. Pt then states that he would like to see someone he knows so he plans to call Urologist. Then he states that  he will make an appt and cancel if not needed. Set up at 9:30 am Dr. Elna Breslow. Referrals REFERRED TO PCP OFFICE Disagree/Comply: Comply

## 2015-11-10 ENCOUNTER — Ambulatory Visit (INDEPENDENT_AMBULATORY_CARE_PROVIDER_SITE_OTHER): Payer: BLUE CROSS/BLUE SHIELD | Admitting: Family

## 2015-11-10 ENCOUNTER — Encounter: Payer: Self-pay | Admitting: Family

## 2015-11-10 ENCOUNTER — Other Ambulatory Visit (INDEPENDENT_AMBULATORY_CARE_PROVIDER_SITE_OTHER): Payer: BLUE CROSS/BLUE SHIELD

## 2015-11-10 VITALS — BP 112/70 | HR 91 | Temp 98.0°F | Resp 18 | Ht 73.0 in | Wt 217.0 lb

## 2015-11-10 DIAGNOSIS — R319 Hematuria, unspecified: Secondary | ICD-10-CM

## 2015-11-10 LAB — URINALYSIS, ROUTINE W REFLEX MICROSCOPIC
BILIRUBIN URINE: NEGATIVE
KETONES UR: NEGATIVE
NITRITE: NEGATIVE
PH: 6 (ref 5.0–8.0)
Specific Gravity, Urine: 1.01 (ref 1.000–1.030)
TOTAL PROTEIN, URINE-UPE24: NEGATIVE
URINE GLUCOSE: NEGATIVE
Urobilinogen, UA: 0.2 (ref 0.0–1.0)

## 2015-11-10 NOTE — Progress Notes (Signed)
Subjective:    Patient ID: Lee Owens, male    DOB: 05/30/1950, 65 y.o.   MRN: [id number]  Chief Complaint  Patient presents with  . Hematuria    starting a couple of weeks ago has had hematuria on and off    HPI:  Lee Owens is a 65 y.o. male who  has a past medical history of Allergic rhinitis; Anxiety; Arthritis; Back pain; BPH (benign prostatic hypertrophy); Chronic fatigue; Depression; Diverticulosis; Hemorrhoids; Hyperlipidemia; Hypertension; Insomnia; Joint pain; Joint swelling; Muscle pain; Night muscle spasms; Pneumonia (1971); Right knee DJD; Urinary frequency; Vitamin B12 deficiency (2009); and Vitamin D deficiency disease. and presents today for an office visit.   This is a new problem. Associated symptom of blood in his urine has been going on for a couple of weeks generally waxing and waning. Describes a pinkish color to his urine and also had what he thinks are small little clots. Denies any fevers, chills, abdominal pain, dysuria, or frequency/urgency. Occasional abdominal pain. There are no modifying factors that make it better or worse. Significant history of PSA elevation and is currently maintained on silodosin for his prostate and managed by urology.   No Known Allergies   Current Outpatient Prescriptions on File Prior to Visit  Medication Sig Dispense Refill  . acetaminophen (TYLENOL) 325 MG tablet Take 2 tablets (650 mg total) by mouth every 6 (six) hours as needed for mild pain (or Fever >/= 101).    Marland Kitchen aspirin EC 81 MG tablet Take 81 mg by mouth daily. Reported on 04/07/2015    . baclofen (LIORESAL) 20 MG tablet Take 1 tablet (20 mg total) by mouth 3 (three) times daily. 270 tablet 3  . bisacodyl (DULCOLAX) 5 MG EC tablet Take 2 tablets every night with dinner until bowel movement.  LAXITIVE.  Restart if two days since last bowel movement 30 tablet 0  . buPROPion (WELLBUTRIN XL) 150 MG 24 hr tablet Take 2 tablets (300 mg total) by mouth daily. 180 tablet 2  .  celecoxib (CELEBREX) 200 MG capsule Take 1 capsule (200 mg total) by mouth daily. 30 capsule 3  . Cholecalciferol (VITAMIN D-3) 5000 UNITS TABS Take 5,000 Units by mouth daily.    . diazepam (VALIUM) 5 MG tablet Take 1 tablet (5 mg total) by mouth every 6 (six) hours as needed for anxiety. 120 tablet 3  . docusate sodium (COLACE) 100 MG capsule Take 100 mg by mouth daily as needed for mild constipation.    . DULoxetine (CYMBALTA) 60 MG capsule TAKE 1 CAPSULE BY MOUTH TWICE DAILY 180 capsule 3  . fentaNYL (DURAGESIC) 25 MCG/HR patch Place 1 patch (25 mcg total) onto the skin every other day. Please fill on or after 12/08/15 15 patch 0  . finasteride (PROSCAR) 5 MG tablet Take 5 mg by mouth daily.    Marland Kitchen LORazepam (ATIVAN) 1 MG tablet Take 1 tablet (1 mg total) by mouth every 8 (eight) hours as needed for anxiety. 60 tablet 0  . losartan (COZAAR) 100 MG tablet TAKE 1 TABLET BY MOUTH DAILY 90 tablet 1  . lovastatin (MEVACOR) 40 MG tablet Take 1 tablet (40 mg total) by mouth at bedtime. 90 tablet 3  . oxyCODONE (OXY IR/ROXICODONE) 5 MG immediate release tablet 1-2 tablets every 4-6 hrs as needed for pain  Please fill on or after 12/08/15 120 tablet 0  . Probiotic Product (ALIGN PO) Take 1 capsule by mouth daily.      . silodosin (RAPAFLO) 8  MG CAPS capsule Take 8 mg by mouth as needed (to relieve urinary symptoms).     . temazepam (RESTORIL) 30 MG capsule TAKE 1-2 CAPLETS BY MOUTH AT BEDTIME AS NEEDED FOR SLEEP. 30 capsule 3  . vitamin B-12 (CYANOCOBALAMIN) 1000 MCG tablet Take 2,000 mcg by mouth daily.      No current facility-administered medications on file prior to visit.      Past Surgical History:  Procedure Laterality Date  . COLONOSCOPY    . JOINT REPLACEMENT    . KNEE ARTHROSCOPY Bilateral 2012  . PROSTATE BIOPSY  01/2010  . 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 TOTAL KNEE ARTHROPLASTY Right 07/07/2013  . TOTAL KNEE ARTHROPLASTY Right 07/07/2013   Procedure: TOTAL KNEE ARTHROPLASTY- right;  Surgeon: Lorn Junes, MD;  Location: [facility];  Service: Orthopedics;  Laterality: Right;    Past Medical History:  Diagnosis Date  . Allergic rhinitis   . Anxiety    takes Valium bid  . Arthritis    "hips, knees, hands" (07/07/2013)  . Back pain    occasionally but reason unknown  . BPH (benign prostatic hypertrophy)    takes Proscar daily  . Chronic fatigue   . Depression    takes Cymbalta daily  . Diverticulosis   . Hemorrhoids   . Hyperlipidemia    takes Lovastatin daily  . Hypertension    takes Lisinopril daily  . Insomnia    takes Restoril nightly   . Joint pain   . Joint swelling   . Muscle pain    takes Baclofen daily  . Night muscle spasms    takes Valium as needed  . Pneumonia 1971  . Right knee DJD   . Urinary frequency    takes rapaflo daily  . Vitamin B12 deficiency 2009  . Vitamin D deficiency disease      Review of Systems  Constitutional: Negative for chills and fever.  Genitourinary: Positive for hematuria. Negative for dysuria, flank pain, frequency, penile pain, penile swelling, scrotal swelling and urgency.      Objective:    BP 112/70 (BP Location: Left Arm, Patient Position: Sitting, Cuff Size: Normal)   Pulse 91   Temp 98 F (36.7 C) (Oral)   Resp 18   Ht 6\' 1"  (1.854 m)   Wt 217 lb (98.4 kg)   SpO2 95%   BMI 28.63 kg/m  Nursing note and vital signs reviewed.  Physical Exam  Constitutional: He is oriented to person, place, and time. He appears well-developed and well-nourished. No distress.  Cardiovascular: Normal rate, regular rhythm, normal heart sounds and intact distal pulses.   Pulmonary/Chest: Effort normal and breath sounds normal.  Abdominal: Normal appearance and bowel sounds are normal. He exhibits no mass. There is no hepatosplenomegaly. There is no tenderness. There is  no rigidity, no rebound, no guarding, no CVA tenderness, no tenderness at McBurney's point and negative Murphy's sign.  Neurological: He is alert and oriented to person, place, and time.  Skin: Skin is warm and dry.  Psychiatric: He has a normal mood and affect. His behavior is normal. Judgment and thought content normal.       Assessment & Plan:   Problem List Items Addressed This Visit      Other   Hematuria - Primary    Lab work unavailable for review during appointment. Abdominal  exam is benign and no costovertebral tenderness. Given improvement and stable vital signs continue to monitor pending UA and urinary culture results.  Unlikely UTI given lack of symptoms with concern for underlying pathology that may require cystoscopy to identify.      Relevant Orders   Urine culture   Urinalysis    Other Visit Diagnoses   None.      I am having Mr. Eicher maintain his Probiotic Product (ALIGN PO), vitamin B-12, finasteride, silodosin, Vitamin D-3, celecoxib, LORazepam, docusate sodium, acetaminophen, bisacodyl, aspirin EC, buPROPion, baclofen, DULoxetine, losartan, fentaNYL, oxyCODONE, diazepam, temazepam, and lovastatin.   Follow-up: Return if symptoms worsen or fail to improve.  Mauricio Po, FNP

## 2015-11-10 NOTE — Patient Instructions (Signed)
Thank you for choosing [facility].  Summary/Instructions:  Please continue to take your medications as prescribed.  We will await the urine lab work.   If symptoms return, we may send you back to Urology.  If your symptoms worsen or fail to improve, please contact our office for further instruction, or in case of emergency go directly to the emergency room at the closest medical facility.    Hematuria, Adult Hematuria is blood in your urine. It can be caused by a bladder infection, kidney infection, prostate infection, kidney stone, or cancer of your urinary tract. Infections can usually be treated with medicine, and a kidney stone usually will pass through your urine. If neither of these is the cause of your hematuria, further workup to find out the reason may be needed. It is very important that you tell your health care provider about any blood you see in your urine, even if the blood stops without treatment or happens without causing pain. Blood in your urine that happens and then stops and then happens again can be a symptom of a very serious condition. Also, pain is not a symptom in the initial stages of many urinary cancers. HOME CARE INSTRUCTIONS   Drink lots of fluid, 3-4 quarts a day. If you have been diagnosed with an infection, cranberry juice is especially recommended, in addition to large amounts of water.  Avoid caffeine, tea, and carbonated beverages because they tend to irritate the bladder.  Avoid alcohol because it may irritate the prostate.  Take all medicines as directed by your health care provider.  If you were prescribed an antibiotic medicine, finish it all even if you start to feel better.  If you have been diagnosed with a kidney stone, follow your health care provider's instructions regarding straining your urine to catch the stone.  Empty your bladder often. Avoid holding urine for long periods of time.  After a bowel movement, women should cleanse  front to back. Use each tissue only once.  Empty your bladder before and after sexual intercourse if you are a male. SEEK MEDICAL CARE IF:  You develop back pain.  You have a fever.  You have a feeling of sickness in your stomach (nausea) or vomiting.  Your symptoms are not better in 3 days. Return sooner if you are getting worse. SEEK IMMEDIATE MEDICAL CARE IF:   You develop severe vomiting and are unable to keep the medicine down.  You develop severe back or abdominal pain despite taking your medicines.  You begin passing a large amount of blood or clots in your urine.  You feel extremely weak or faint, or you pass out. MAKE SURE YOU:   Understand these instructions.  Will watch your condition.  Will get help right away if you are not doing well or get worse.   This information is not intended to replace advice given to you by your health care provider. Make sure you discuss any questions you have with your health care provider.   Document Released: 03/13/2005 Document Revised: 04/03/2014 Document Reviewed: 11/11/2012 Elsevier Interactive Patient Education [employer].

## 2015-11-10 NOTE — Assessment & Plan Note (Addendum)
Lab work unavailable for review during appointment. Abdominal exam is benign and no costovertebral tenderness. Given improvement and stable vital signs continue to monitor pending UA and urinary culture results.  Unlikely UTI given lack of symptoms with concern for underlying pathology that may require cystoscopy to identify.

## 2015-11-11 ENCOUNTER — Encounter: Payer: Self-pay | Admitting: Family

## 2015-11-12 LAB — URINE CULTURE: ORGANISM ID, BACTERIA: NO GROWTH

## 2015-11-14 ENCOUNTER — Encounter: Payer: Self-pay | Admitting: Family

## 2015-11-15 MED ORDER — CIPROFLOXACIN HCL 500 MG PO TABS: 500.0000 mg | ORAL_TABLET | Freq: Two times a day (BID) | ORAL | 0 refills | Status: DC

## 2016-01-05 ENCOUNTER — Encounter: Payer: Self-pay | Admitting: Internal Medicine

## 2016-01-05 ENCOUNTER — Ambulatory Visit (INDEPENDENT_AMBULATORY_CARE_PROVIDER_SITE_OTHER): Payer: BLUE CROSS/BLUE SHIELD | Admitting: Internal Medicine

## 2016-01-05 VITALS — BP 122/80 | HR 72 | Temp 98.2°F | Wt 224.0 lb

## 2016-01-05 DIAGNOSIS — Z23 Encounter for immunization: Secondary | ICD-10-CM

## 2016-01-05 DIAGNOSIS — E538 Deficiency of other specified B group vitamins: Secondary | ICD-10-CM

## 2016-01-05 DIAGNOSIS — R972 Elevated prostate specific antigen [PSA]: Secondary | ICD-10-CM

## 2016-01-05 DIAGNOSIS — Z1211 Encounter for screening for malignant neoplasm of colon: Secondary | ICD-10-CM | POA: Diagnosis not present

## 2016-01-05 DIAGNOSIS — M791 Myalgia, unspecified site: Secondary | ICD-10-CM

## 2016-01-05 MED ORDER — FENTANYL 25 MCG/HR TD PT72: 25.0000 ug | MEDICATED_PATCH | TRANSDERMAL | 0 refills | Status: DC

## 2016-01-05 MED ORDER — OXYCODONE HCL 5 MG PO TABS: ORAL_TABLET | ORAL | 0 refills | Status: DC

## 2016-01-05 MED ORDER — MAGNESIUM GLUCONATE 30 MG PO TABS: 30.0000 mg | ORAL_TABLET | Freq: Two times a day (BID) | ORAL | 5 refills | Status: DC

## 2016-01-05 NOTE — Progress Notes (Signed)
Pre visit review using our clinic review tool, if applicable. No additional management support is needed unless otherwise documented below in the visit note. 

## 2016-01-05 NOTE — Assessment & Plan Note (Signed)
Dr Diona Fanti - neg bx

## 2016-01-05 NOTE — Addendum Note (Signed)
Addended by: Cresenciano Lick on: 01/05/2016 04:52 PM   Modules accepted: Orders

## 2016-01-05 NOTE — Assessment & Plan Note (Addendum)
On B12/B complex 

## 2016-01-05 NOTE — Progress Notes (Signed)
Subjective:  Patient ID: Lee Owens, male    DOB: 1950-10-28  Age: 65 y.o. MRN: [id number]  CC: No chief complaint on file.   HPI Lee Owens presents for chronic pain and spasms,  Depression f/u   Outpatient Medications Prior to Visit  Medication Sig Dispense Refill  . aspirin EC 81 MG tablet Take 81 mg by mouth daily. Reported on 04/07/2015    . baclofen (LIORESAL) 20 MG tablet Take 1 tablet (20 mg total) by mouth 3 (three) times daily. 270 tablet 3  . bisacodyl (DULCOLAX) 5 MG EC tablet Take 2 tablets every night with dinner until bowel movement.  LAXITIVE.  Restart if two days since last bowel movement 30 tablet 0  . buPROPion (WELLBUTRIN XL) 150 MG 24 hr tablet Take 2 tablets (300 mg total) by mouth daily. 180 tablet 2  . Cholecalciferol (VITAMIN D-3) 5000 UNITS TABS Take 5,000 Units by mouth daily.    . diazepam (VALIUM) 5 MG tablet Take 1 tablet (5 mg total) by mouth every 6 (six) hours as needed for anxiety. 120 tablet 3  . docusate sodium (COLACE) 100 MG capsule Take 100 mg by mouth daily as needed for mild constipation.    . DULoxetine (CYMBALTA) 60 MG capsule TAKE 1 CAPSULE BY MOUTH TWICE DAILY 180 capsule 3  . fentaNYL (DURAGESIC) 25 MCG/HR patch Place 1 patch (25 mcg total) onto the skin every other day. Please fill on or after 12/08/15 15 patch 0  . finasteride (PROSCAR) 5 MG tablet Take 5 mg by mouth daily.    Marland Kitchen losartan (COZAAR) 100 MG tablet TAKE 1 TABLET BY MOUTH DAILY 90 tablet 1  . lovastatin (MEVACOR) 40 MG tablet Take 1 tablet (40 mg total) by mouth at bedtime. 90 tablet 3  . oxyCODONE (OXY IR/ROXICODONE) 5 MG immediate release tablet 1-2 tablets every 4-6 hrs as needed for pain  Please fill on or after 12/08/15 120 tablet 0  . Probiotic Product (ALIGN PO) Take 1 capsule by mouth daily.      . temazepam (RESTORIL) 30 MG capsule TAKE 1-2 CAPLETS BY MOUTH AT BEDTIME AS NEEDED FOR SLEEP. 30 capsule 3  . vitamin B-12 (CYANOCOBALAMIN) 1000 MCG tablet Take 2,000 mcg by  mouth daily.     . ciprofloxacin (CIPRO) 500 MG tablet Take 1 tablet (500 mg total) by mouth 2 (two) times daily. 20 tablet 0  . silodosin (RAPAFLO) 8 MG CAPS capsule Take 8 mg by mouth as needed (to relieve urinary symptoms).     Marland Kitchen acetaminophen (TYLENOL) 325 MG tablet Take 2 tablets (650 mg total) by mouth every 6 (six) hours as needed for mild pain (or Fever >/= 101). (Patient not taking: Reported on 01/05/2016)    . celecoxib (CELEBREX) 200 MG capsule Take 1 capsule (200 mg total) by mouth daily. (Patient not taking: Reported on 01/05/2016) 30 capsule 3  . LORazepam (ATIVAN) 1 MG tablet Take 1 tablet (1 mg total) by mouth every 8 (eight) hours as needed for anxiety. (Patient not taking: Reported on 01/05/2016) 60 tablet 0   No facility-administered medications prior to visit.     ROS Review of Systems  Constitutional: Positive for unexpected weight change. Negative for appetite change and fatigue.  HENT: Negative for congestion, nosebleeds, sneezing, sore throat and trouble swallowing.   Eyes: Negative for itching and visual disturbance.  Respiratory: Negative for cough.   Cardiovascular: Negative for chest pain, palpitations and leg swelling.  Gastrointestinal: Negative for abdominal distention, blood in  stool, diarrhea and nausea.  Genitourinary: Negative for frequency and hematuria.  Musculoskeletal: Positive for arthralgias, gait problem, myalgias and neck stiffness. Negative for back pain, joint swelling and neck pain.  Skin: Negative for rash.  Neurological: Negative for dizziness, tremors, speech difficulty and weakness.  Psychiatric/Behavioral: Positive for sleep disturbance. Negative for agitation and dysphoric mood. The patient is nervous/anxious.     Objective:  BP 122/80   Pulse 72   Temp 98.2 F (36.8 C) (Oral)   Wt 224 lb (101.6 kg)   SpO2 94%   BMI 29.55 kg/m   BP Readings from Last 3 Encounters:  01/05/16 122/80  11/10/15 112/70  10/06/15 118/78    Wt  Readings from Last 3 Encounters:  01/05/16 224 lb (101.6 kg)  11/10/15 217 lb (98.4 kg)  10/06/15 209 lb 1.9 oz (94.9 kg)    Physical Exam  Constitutional: He is oriented to person, place, and time. He appears well-developed. No distress.  NAD  HENT:  Mouth/Throat: Oropharynx is clear and moist.  Eyes: Conjunctivae are normal. Pupils are equal, round, and reactive to light.  Neck: Normal range of motion. No JVD present. No thyromegaly present.  Cardiovascular: Normal rate, regular rhythm, normal heart sounds and intact distal pulses.  Exam reveals no gallop and no friction rub.   No murmur heard. Pulmonary/Chest: Effort normal and breath sounds normal. No respiratory distress. He has no wheezes. He has no rales. He exhibits no tenderness.  Abdominal: Soft. Bowel sounds are normal. He exhibits no distension and no mass. There is no tenderness. There is no rebound and no guarding.  Musculoskeletal: Normal range of motion. He exhibits tenderness. He exhibits no edema.  Lymphadenopathy:    He has no cervical adenopathy.  Neurological: He is alert and oriented to person, place, and time. He has normal reflexes. No cranial nerve deficit. He exhibits normal muscle tone. He displays a negative Romberg sign. Coordination and gait normal.  Skin: Skin is warm and dry. No rash noted.  Psychiatric: He has a normal mood and affect. His behavior is normal. Judgment and thought content normal.    Lab Results  Component Value Date   WBC 5.9 09/30/2015   HGB 14.1 09/30/2015   HCT 41.3 09/30/2015   PLT 276.0 09/30/2015   GLUCOSE 102 (H) 09/30/2015   CHOL 184 03/30/2014   TRIG 149.0 03/30/2014   HDL 59.30 03/30/2014   LDLCALC 95 03/30/2014   ALT 17 09/30/2015   AST 13 09/30/2015   NA 143 09/30/2015   K 4.5 09/30/2015   CL 106 09/30/2015   CREATININE 0.86 09/30/2015   BUN 7 09/30/2015   CO2 30 09/30/2015   TSH 1.27 09/30/2015   PSA 1.01 09/30/2015   INR 0.96 06/30/2013   HGBA1C 5.6  01/15/2009    Mm Digital Diagnostic Bilat  Result Date: 01/21/2014 CLINICAL DATA:  Palpable right breast mass, 65 year old male. History of a recent fall with trauma to the right chest wall. The patient takes multiple medications including finasteride, which has been associated with gynecomastia. EXAM: DIGITAL DIAGNOSTIC  bilateral MAMMOGRAM WITH CAD COMPARISON:  None. ACR Breast Density Category a: The breast tissue is almost entirely fatty. FINDINGS: Right greater than left benign appearing retroareolar gynecomastia is identified without suspicious imaging feature. Mammographic images were processed with CAD. IMPRESSION: Right remote left mild gynecomastia. Clinical followup is suggested. Of the patient's medications provided on a list he brought with him today, finasteride is most likely to be associated with gynecomastia. RECOMMENDATION: Clinical  followup I have discussed the findings and recommendations with the patient. Results were also provided in writing at the conclusion of the visit. If applicable, a reminder letter will be sent to the patient regarding the next appointment. BI-RADS CATEGORY  1: Negative. Electronically Signed   By: Conchita Paris M.D.   On: 01/21/2014 14:50    Assessment & Plan:   There are no diagnoses linked to this encounter. I have discontinued Mr. Kapla silodosin and ciprofloxacin. I am also having him maintain his Probiotic Product (ALIGN PO), vitamin B-12, finasteride, Vitamin D-3, celecoxib, LORazepam, docusate sodium, acetaminophen, bisacodyl, aspirin EC, buPROPion, baclofen, DULoxetine, losartan, fentaNYL, oxyCODONE, diazepam, temazepam, lovastatin, and tamsulosin.  Meds ordered this encounter  Medications  . tamsulosin (FLOMAX) 0.4 MG CAPS capsule    Sig: Take 1 capsule by mouth daily.    Refill:  2     Follow-up: No Follow-up on file.  Walker Kehr, MD

## 2016-02-04 ENCOUNTER — Other Ambulatory Visit: Payer: Self-pay | Admitting: *Deleted

## 2016-02-04 MED ORDER — LOSARTAN POTASSIUM 100 MG PO TABS: 100.0000 mg | ORAL_TABLET | Freq: Every day | ORAL | 1 refills | Status: DC

## 2016-02-06 ENCOUNTER — Other Ambulatory Visit: Payer: Self-pay | Admitting: Internal Medicine

## 2016-02-29 ENCOUNTER — Telehealth: Payer: Self-pay | Admitting: *Deleted

## 2016-02-29 NOTE — Telephone Encounter (Signed)
OK to fill this prescription with additional refills x3 Thank you!  

## 2016-02-29 NOTE — Telephone Encounter (Signed)
Rec'd fax pt requesting refill on his Temazepam 30 mg.../lmb

## 2016-03-01 MED ORDER — TEMAZEPAM 30 MG PO CAPS: ORAL_CAPSULE | ORAL | 3 refills | Status: DC

## 2016-03-01 NOTE — Telephone Encounter (Signed)
Called refill into [facility] on [address]. Had to leave on pharmacy vm...Lee Owens

## 2016-04-05 ENCOUNTER — Ambulatory Visit (INDEPENDENT_AMBULATORY_CARE_PROVIDER_SITE_OTHER): Payer: Medicare Other | Admitting: Internal Medicine

## 2016-04-05 ENCOUNTER — Encounter: Payer: Self-pay | Admitting: Internal Medicine

## 2016-04-05 VITALS — BP 110/60 | HR 72 | Wt 225.0 lb

## 2016-04-05 DIAGNOSIS — Z0000 Encounter for general adult medical examination without abnormal findings: Secondary | ICD-10-CM

## 2016-04-05 DIAGNOSIS — N320 Bladder-neck obstruction: Secondary | ICD-10-CM | POA: Diagnosis not present

## 2016-04-05 DIAGNOSIS — I10 Essential (primary) hypertension: Secondary | ICD-10-CM | POA: Diagnosis not present

## 2016-04-05 DIAGNOSIS — N401 Enlarged prostate with lower urinary tract symptoms: Secondary | ICD-10-CM | POA: Diagnosis not present

## 2016-04-05 DIAGNOSIS — E785 Hyperlipidemia, unspecified: Secondary | ICD-10-CM | POA: Diagnosis not present

## 2016-04-05 MED ORDER — DIAZEPAM 5 MG PO TABS: 5.0000 mg | ORAL_TABLET | Freq: Four times a day (QID) | ORAL | 3 refills | Status: DC | PRN

## 2016-04-05 MED ORDER — FENTANYL 25 MCG/HR TD PT72: 25.0000 ug | MEDICATED_PATCH | TRANSDERMAL | 0 refills | Status: DC

## 2016-04-05 MED ORDER — OXYCODONE HCL 10 MG PO TABS: 5.0000 mg | ORAL_TABLET | Freq: Three times a day (TID) | ORAL | 0 refills | Status: DC | PRN

## 2016-04-05 NOTE — Assessment & Plan Note (Signed)
Losartan 

## 2016-04-05 NOTE — Progress Notes (Signed)
Pre visit review using our clinic review tool, if applicable. No additional management support is needed unless otherwise documented below in the visit note. 

## 2016-04-05 NOTE — Progress Notes (Signed)
Subjective:  Patient ID: Lee Owens, male    DOB: Nov 07, 1950  Age: 66 y.o. MRN: [id number]  CC: No chief complaint on file.   HPI Lee Owens presents for chronic pain/spasms, depression, B12 def f/u. Pain is worse. He cut back on work 75%, LBP/spasms is still very bad   Outpatient Medications Prior to Visit  Medication Sig Dispense Refill  . aspirin EC 81 MG tablet Take 81 mg by mouth daily. Reported on 04/07/2015    . baclofen (LIORESAL) 20 MG tablet Take 1 tablet (20 mg total) by mouth 3 (three) times daily. 270 tablet 3  . bisacodyl (DULCOLAX) 5 MG EC tablet Take 2 tablets every night with dinner until bowel movement.  LAXITIVE.  Restart if two days since last bowel movement 30 tablet 0  . buPROPion (WELLBUTRIN XL) 150 MG 24 hr tablet TAKE 2 TABLETS DAILY. 180 tablet 1  . Cholecalciferol (VITAMIN D-3) 5000 UNITS TABS Take 5,000 Units by mouth daily.    . diazepam (VALIUM) 5 MG tablet Take 1 tablet (5 mg total) by mouth every 6 (six) hours as needed for anxiety. 120 tablet 3  . docusate sodium (COLACE) 100 MG capsule Take 100 mg by mouth daily as needed for mild constipation.    . DULoxetine (CYMBALTA) 60 MG capsule TAKE 1 CAPSULE BY MOUTH TWICE DAILY 180 capsule 3  . fentaNYL (DURAGESIC) 25 MCG/HR patch Place 1 patch (25 mcg total) onto the skin every other day. Please fill on or after 03/08/16 15 patch 0  . finasteride (PROSCAR) 5 MG tablet Take 5 mg by mouth daily.    Marland Kitchen losartan (COZAAR) 100 MG tablet Take 1 tablet (100 mg total) by mouth daily. 90 tablet 1  . lovastatin (MEVACOR) 40 MG tablet Take 1 tablet (40 mg total) by mouth at bedtime. 90 tablet 3  . magnesium gluconate (MAGONATE) 30 MG tablet Take 1 tablet (30 mg total) by mouth 2 (two) times daily. 60 tablet 5  . Probiotic Product (ALIGN PO) Take 1 capsule by mouth daily.      . tamsulosin (FLOMAX) 0.4 MG CAPS capsule Take 1 capsule by mouth daily.  2  . temazepam (RESTORIL) 30 MG capsule TAKE 1-2 CAPLETS BY MOUTH AT  BEDTIME AS NEEDED FOR SLEEP. 30 capsule 3  . vitamin B-12 (CYANOCOBALAMIN) 1000 MCG tablet Take 2,000 mcg by mouth daily.     Marland Kitchen acetaminophen (TYLENOL) 325 MG tablet Take 2 tablets (650 mg total) by mouth every 6 (six) hours as needed for mild pain (or Fever >/= 101). (Patient not taking: Reported on 04/05/2016)    . celecoxib (CELEBREX) 200 MG capsule Take 1 capsule (200 mg total) by mouth daily. (Patient not taking: Reported on 04/05/2016) 30 capsule 3  . LORazepam (ATIVAN) 1 MG tablet Take 1 tablet (1 mg total) by mouth every 8 (eight) hours as needed for anxiety. (Patient not taking: Reported on 04/05/2016) 60 tablet 0  . oxyCODONE (OXY IR/ROXICODONE) 5 MG immediate release tablet 1-2 tablets every 4-6 hrs as needed for pain  Please fill on or after 03/08/16 (Patient not taking: Reported on 04/05/2016) 120 tablet 0  . buPROPion (WELLBUTRIN XL) 150 MG 24 hr tablet Take 2 tablets (300 mg total) by mouth daily. (Patient not taking: Reported on 04/05/2016) 180 tablet 2   No facility-administered medications prior to visit.     ROS Review of Systems  Constitutional: Positive for fatigue. Negative for appetite change and unexpected weight change.  HENT: Negative for congestion,  nosebleeds, sneezing, sore throat and trouble swallowing.   Eyes: Negative for itching and visual disturbance.  Respiratory: Negative for cough.   Cardiovascular: Negative for chest pain, palpitations and leg swelling.  Gastrointestinal: Negative for abdominal distention, blood in stool, diarrhea and nausea.  Genitourinary: Negative for frequency and hematuria.  Musculoskeletal: Positive for arthralgias, back pain, gait problem, myalgias and neck pain. Negative for joint swelling.  Skin: Negative for rash.  Neurological: Negative for dizziness, tremors, speech difficulty and weakness.  Psychiatric/Behavioral: Positive for decreased concentration and dysphoric mood. Negative for agitation, sleep disturbance and suicidal  ideas. The patient is not nervous/anxious.     Objective:  BP 110/60   Pulse 72   Wt 225 lb (102.1 kg)   SpO2 93%   BMI 29.69 kg/m   BP Readings from Last 3 Encounters:  04/05/16 110/60  01/05/16 122/80  11/10/15 112/70    Wt Readings from Last 3 Encounters:  04/05/16 225 lb (102.1 kg)  01/05/16 224 lb (101.6 kg)  11/10/15 217 lb (98.4 kg)    Physical Exam  Constitutional: He is oriented to person, place, and time. He appears well-developed. No distress.  NAD  HENT:  Mouth/Throat: Oropharynx is clear and moist.  Eyes: Conjunctivae are normal. Pupils are equal, round, and reactive to light.  Neck: Normal range of motion. No JVD present. No thyromegaly present.  Cardiovascular: Normal rate, regular rhythm, normal heart sounds and intact distal pulses.  Exam reveals no gallop and no friction rub.   No murmur heard. Pulmonary/Chest: Effort normal and breath sounds normal. No respiratory distress. He has no wheezes. He has no rales. He exhibits no tenderness.  Abdominal: Soft. Bowel sounds are normal. He exhibits no distension and no mass. There is no tenderness. There is no rebound and no guarding.  Musculoskeletal: Normal range of motion. He exhibits tenderness. He exhibits no edema.  Lymphadenopathy:    He has no cervical adenopathy.  Neurological: He is alert and oriented to person, place, and time. He has normal reflexes. No cranial nerve deficit. He exhibits normal muscle tone. He displays a negative Romberg sign. Coordination abnormal. Gait normal.  Skin: Skin is warm and dry. No rash noted.  Psychiatric: He has a normal mood and affect. His behavior is normal. Judgment and thought content normal.  LS tender  Lab Results  Component Value Date   WBC 5.9 09/30/2015   HGB 14.1 09/30/2015   HCT 41.3 09/30/2015   PLT 276.0 09/30/2015   GLUCOSE 102 (H) 09/30/2015   CHOL 184 03/30/2014   TRIG 149.0 03/30/2014   HDL 59.30 03/30/2014   LDLCALC 95 03/30/2014   ALT 17  09/30/2015   AST 13 09/30/2015   NA 143 09/30/2015   K 4.5 09/30/2015   CL 106 09/30/2015   CREATININE 0.86 09/30/2015   BUN 7 09/30/2015   CO2 30 09/30/2015   TSH 1.27 09/30/2015   PSA 1.01 09/30/2015   INR 0.96 06/30/2013   HGBA1C 5.6 01/15/2009    Mm Digital Diagnostic Bilat  Result Date: 01/21/2014 CLINICAL DATA:  Palpable right breast mass, 66 year old male. History of a recent fall with trauma to the right chest wall. The patient takes multiple medications including finasteride, which has been associated with gynecomastia. EXAM: DIGITAL DIAGNOSTIC  bilateral MAMMOGRAM WITH CAD COMPARISON:  None. ACR Breast Density Category a: The breast tissue is almost entirely fatty. FINDINGS: Right greater than left benign appearing retroareolar gynecomastia is identified without suspicious imaging feature. Mammographic images were processed with CAD.  IMPRESSION: Right remote left mild gynecomastia. Clinical followup is suggested. Of the patient's medications provided on a list he brought with him today, finasteride is most likely to be associated with gynecomastia. RECOMMENDATION: Clinical followup I have discussed the findings and recommendations with the patient. Results were also provided in writing at the conclusion of the visit. If applicable, a reminder letter will be sent to the patient regarding the next appointment. BI-RADS CATEGORY  1: Negative. Electronically Signed   By: Conchita Paris M.D.   On: 01/21/2014 14:50    Assessment & Plan:   There are no diagnoses linked to this encounter. I am having Mr. Pamphile maintain his Probiotic Product (ALIGN PO), vitamin B-12, finasteride, Vitamin D-3, celecoxib, LORazepam, docusate sodium, acetaminophen, bisacodyl, aspirin EC, baclofen, DULoxetine, diazepam, lovastatin, tamsulosin, magnesium gluconate, oxyCODONE, fentaNYL, losartan, buPROPion, and temazepam.  No orders of the defined types were placed in this encounter.    Follow-up: No  Follow-up on file.  Walker Kehr, MD

## 2016-04-14 ENCOUNTER — Encounter: Payer: Self-pay | Admitting: Internal Medicine

## 2016-06-29 ENCOUNTER — Other Ambulatory Visit (INDEPENDENT_AMBULATORY_CARE_PROVIDER_SITE_OTHER): Payer: Medicare Other

## 2016-06-29 DIAGNOSIS — E785 Hyperlipidemia, unspecified: Secondary | ICD-10-CM

## 2016-06-29 DIAGNOSIS — N320 Bladder-neck obstruction: Secondary | ICD-10-CM

## 2016-06-29 DIAGNOSIS — N401 Enlarged prostate with lower urinary tract symptoms: Secondary | ICD-10-CM | POA: Diagnosis not present

## 2016-06-29 DIAGNOSIS — I10 Essential (primary) hypertension: Secondary | ICD-10-CM

## 2016-06-29 DIAGNOSIS — Z0000 Encounter for general adult medical examination without abnormal findings: Secondary | ICD-10-CM | POA: Diagnosis not present

## 2016-06-29 LAB — URINALYSIS
BILIRUBIN URINE: NEGATIVE
HGB URINE DIPSTICK: NEGATIVE
Ketones, ur: NEGATIVE
LEUKOCYTES UA: NEGATIVE
NITRITE: NEGATIVE
Specific Gravity, Urine: 1.01 (ref 1.000–1.030)
Total Protein, Urine: NEGATIVE
Urine Glucose: NEGATIVE
Urobilinogen, UA: 0.2 (ref 0.0–1.0)
pH: 6 (ref 5.0–8.0)

## 2016-06-29 LAB — HEPATIC FUNCTION PANEL
ALT: 26 U/L (ref 0–53)
AST: 16 U/L (ref 0–37)
Albumin: 4.3 g/dL (ref 3.5–5.2)
Alkaline Phosphatase: 65 U/L (ref 39–117)
BILIRUBIN TOTAL: 0.6 mg/dL (ref 0.2–1.2)
Bilirubin, Direct: 0.1 mg/dL (ref 0.0–0.3)
Total Protein: 6.4 g/dL (ref 6.0–8.3)

## 2016-06-29 LAB — CBC WITH DIFFERENTIAL/PLATELET
BASOS PCT: 0.5 % (ref 0.0–3.0)
Basophils Absolute: 0 10*3/uL (ref 0.0–0.1)
EOS PCT: 2.5 % (ref 0.0–5.0)
Eosinophils Absolute: 0.2 10*3/uL (ref 0.0–0.7)
HCT: 42.2 % (ref 39.0–52.0)
HEMOGLOBIN: 14.7 g/dL (ref 13.0–17.0)
LYMPHS ABS: 1.8 10*3/uL (ref 0.7–4.0)
Lymphocytes Relative: 26.4 % (ref 12.0–46.0)
MCHC: 34.8 g/dL (ref 30.0–36.0)
MCV: 91.4 fl (ref 78.0–100.0)
MONO ABS: 0.6 10*3/uL (ref 0.1–1.0)
Monocytes Relative: 8.6 % (ref 3.0–12.0)
NEUTROS ABS: 4.1 10*3/uL (ref 1.4–7.7)
Neutrophils Relative %: 62 % (ref 43.0–77.0)
Platelets: 254 10*3/uL (ref 150.0–400.0)
RBC: 4.62 Mil/uL (ref 4.22–5.81)
RDW: 13.4 % (ref 11.5–15.5)
WBC: 6.6 10*3/uL (ref 4.0–10.5)

## 2016-06-29 LAB — BASIC METABOLIC PANEL
BUN: 9 mg/dL (ref 6–23)
CHLORIDE: 106 meq/L (ref 96–112)
CO2: 30 meq/L (ref 19–32)
Calcium: 9.4 mg/dL (ref 8.4–10.5)
Creatinine, Ser: 0.96 mg/dL (ref 0.40–1.50)
GFR: 83.49 mL/min (ref >60.00)
GLUCOSE: 117 mg/dL — AB (ref 70–99)
POTASSIUM: 4.5 meq/L (ref 3.5–5.1)
Sodium: 142 mEq/L (ref 135–145)

## 2016-06-29 LAB — TSH: TSH: 1.84 u[IU]/mL (ref 0.35–4.50)

## 2016-06-29 LAB — LIPID PANEL
CHOLESTEROL: 174 mg/dL (ref 0–200)
HDL: 55.5 mg/dL (ref >39.00)
LDL CALC: 89 mg/dL (ref 0–99)
NonHDL: 118.15
TRIGLYCERIDES: 148 mg/dL (ref 0.0–149.0)
Total CHOL/HDL Ratio: 3
VLDL: 29.6 mg/dL (ref 0.0–40.0)

## 2016-06-29 LAB — PSA: PSA: 0.76 ng/mL (ref 0.10–4.00)

## 2016-07-05 ENCOUNTER — Telehealth: Payer: Self-pay | Admitting: Internal Medicine

## 2016-07-05 ENCOUNTER — Encounter: Payer: Self-pay | Admitting: Internal Medicine

## 2016-07-05 ENCOUNTER — Ambulatory Visit (INDEPENDENT_AMBULATORY_CARE_PROVIDER_SITE_OTHER): Payer: Medicare Other | Admitting: Internal Medicine

## 2016-07-05 DIAGNOSIS — R42 Dizziness and giddiness: Secondary | ICD-10-CM | POA: Diagnosis not present

## 2016-07-05 DIAGNOSIS — I10 Essential (primary) hypertension: Secondary | ICD-10-CM | POA: Diagnosis not present

## 2016-07-05 DIAGNOSIS — F419 Anxiety disorder, unspecified: Secondary | ICD-10-CM | POA: Diagnosis not present

## 2016-07-05 DIAGNOSIS — E538 Deficiency of other specified B group vitamins: Secondary | ICD-10-CM

## 2016-07-05 DIAGNOSIS — R310 Gross hematuria: Secondary | ICD-10-CM

## 2016-07-05 DIAGNOSIS — F331 Major depressive disorder, recurrent, moderate: Secondary | ICD-10-CM

## 2016-07-05 DIAGNOSIS — Z23 Encounter for immunization: Secondary | ICD-10-CM | POA: Diagnosis not present

## 2016-07-05 MED ORDER — FENTANYL 25 MCG/HR TD PT72: 25.0000 ug | MEDICATED_PATCH | TRANSDERMAL | 0 refills | Status: DC

## 2016-07-05 MED ORDER — DIAZEPAM 5 MG PO TABS: 5.0000 mg | ORAL_TABLET | Freq: Four times a day (QID) | ORAL | 3 refills | Status: DC | PRN

## 2016-07-05 MED ORDER — OXYCODONE HCL 10 MG PO TABS: 5.0000 mg | ORAL_TABLET | Freq: Three times a day (TID) | ORAL | 0 refills | Status: DC | PRN

## 2016-07-05 MED ORDER — TEMAZEPAM 30 MG PO CAPS: ORAL_CAPSULE | ORAL | 5 refills | Status: DC

## 2016-07-05 MED ORDER — DULOXETINE HCL 60 MG PO CPEP
60.0000 mg | ORAL_CAPSULE | Freq: Two times a day (BID) | ORAL | 3 refills | Status: DC
Start: 2016-07-05 — End: 2017-08-30

## 2016-07-05 NOTE — Telephone Encounter (Signed)
Pt called and said that the prescriptions that were sent today were sent to the wrong [facility]. He asked if it could be changed to the [facility] on [address] and [address] and this should be the pharmacy for all further refills.

## 2016-07-05 NOTE — Assessment & Plan Note (Addendum)
He was on 50 mg Losartan. Will d/c

## 2016-07-05 NOTE — Patient Instructions (Signed)
MC well w/Jill 

## 2016-07-05 NOTE — Assessment & Plan Note (Signed)
Cymbalta, Wellbutrin 

## 2016-07-05 NOTE — Assessment & Plan Note (Signed)
He was on 50 mg Losartan. Will d/c

## 2016-07-05 NOTE — Telephone Encounter (Signed)
All were printed except for cymbalta, LM for pt to call back just to make sure this is the only RX he needs.

## 2016-07-05 NOTE — Assessment & Plan Note (Signed)
On B12/B complex 

## 2016-07-05 NOTE — Progress Notes (Signed)
Pre visit review using our clinic review tool, if applicable. No additional management support is needed unless otherwise documented below in the visit note. 

## 2016-07-05 NOTE — Assessment & Plan Note (Signed)
4/18 gross Will ref to Dr Diona Fanti

## 2016-07-05 NOTE — Progress Notes (Signed)
Subjective:  Patient ID: Lee Owens, male    DOB: [birth year]  Age: 66 y.o. MRN: [id number]  CC: No chief complaint on file.   HPI Corleone Biegler presents for a well exam C/o dizziness when gets up C/o blood in the urine  Outpatient Medications Prior to Visit  Medication Sig Dispense Refill  . aspirin EC 81 MG tablet Take 81 mg by mouth daily. Reported on 04/07/2015    . buPROPion (WELLBUTRIN XL) 150 MG 24 hr tablet TAKE 2 TABLETS DAILY. 180 tablet 1  . Cholecalciferol (VITAMIN D-3) 5000 UNITS TABS Take 5,000 Units by mouth daily.    . diazepam (VALIUM) 5 MG tablet Take 1 tablet (5 mg total) by mouth every 6 (six) hours as needed for anxiety. 120 tablet 3  . DULoxetine (CYMBALTA) 60 MG capsule TAKE 1 CAPSULE BY MOUTH TWICE DAILY 180 capsule 3  . fentaNYL (DURAGESIC) 25 MCG/HR patch Place 1 patch (25 mcg total) onto the skin every other day. Please fill on or after 06/06/16 15 patch 0  . finasteride (PROSCAR) 5 MG tablet Take 5 mg by mouth daily.    Marland Kitchen losartan (COZAAR) 100 MG tablet Take 1 tablet (100 mg total) by mouth daily. 90 tablet 1  . lovastatin (MEVACOR) 40 MG tablet Take 1 tablet (40 mg total) by mouth at bedtime. 90 tablet 3  . magnesium gluconate (MAGONATE) 30 MG tablet Take 1 tablet (30 mg total) by mouth 2 (two) times daily. 60 tablet 5  . Oxycodone HCl 10 MG TABS Take 0.5-1 tablets (5-10 mg total) by mouth 3 (three) times daily as needed. 90 each 0  . tamsulosin (FLOMAX) 0.4 MG CAPS capsule Take 1 capsule by mouth daily.  2  . temazepam (RESTORIL) 30 MG capsule TAKE 1-2 CAPLETS BY MOUTH AT BEDTIME AS NEEDED FOR SLEEP. 30 capsule 3  . vitamin B-12 (CYANOCOBALAMIN) 1000 MCG tablet Take 2,000 mcg by mouth daily.     Marland Kitchen acetaminophen (TYLENOL) 325 MG tablet Take 2 tablets (650 mg total) by mouth every 6 (six) hours as needed for mild pain (or Fever >/= 101). (Patient not taking: Reported on 04/05/2016)    . bisacodyl (DULCOLAX) 5 MG EC tablet Take 2 tablets every night with dinner  until bowel movement.  LAXITIVE.  Restart if two days since last bowel movement 30 tablet 0  . celecoxib (CELEBREX) 200 MG capsule Take 1 capsule (200 mg total) by mouth daily. (Patient not taking: Reported on 04/05/2016) 30 capsule 3  . docusate sodium (COLACE) 100 MG capsule Take 100 mg by mouth daily as needed for mild constipation.    Marland Kitchen LORazepam (ATIVAN) 1 MG tablet Take 1 tablet (1 mg total) by mouth every 8 (eight) hours as needed for anxiety. (Patient not taking: Reported on 04/05/2016) 60 tablet 0  . Probiotic Product (ALIGN PO) Take 1 capsule by mouth daily.       No facility-administered medications prior to visit.     ROS Review of Systems  Constitutional: Negative for appetite change, fatigue and unexpected weight change.  HENT: Negative for congestion, nosebleeds, sneezing, sore throat and trouble swallowing.   Eyes: Negative for itching and visual disturbance.  Respiratory: Negative for cough.   Cardiovascular: Negative for chest pain, palpitations and leg swelling.  Gastrointestinal: Negative for abdominal distention, blood in stool, diarrhea and nausea.  Genitourinary: Positive for hematuria. Negative for frequency.  Musculoskeletal: Positive for arthralgias, back pain and myalgias. Negative for gait problem, joint swelling and neck pain.  Skin: Negative for rash.  Neurological: Negative for dizziness, tremors, speech difficulty and weakness.  Psychiatric/Behavioral: Negative for agitation, dysphoric mood and sleep disturbance. The patient is nervous/anxious.     Objective:  BP 110/68 (BP Location: Left Arm, Patient Position: Sitting, Cuff Size: Large)   Pulse 75   Temp 98 F (36.7 C) (Oral)   Ht 6\' 1"  (1.854 m)   Wt 221 lb (100.2 kg)   SpO2 98%   BMI 29.16 kg/m   BP Readings from Last 3 Encounters:  07/05/16 110/68  04/05/16 110/60  01/05/16 122/80    Wt Readings from Last 3 Encounters:  07/05/16 221 lb (100.2 kg)  04/05/16 225 lb (102.1 kg)  01/05/16 224  lb (101.6 kg)    Physical Exam  Constitutional: He is oriented to person, place, and time. He appears well-developed and well-nourished. No distress.  HENT:  Head: Normocephalic and atraumatic.  Right Ear: External ear normal.  Left Ear: External ear normal.  Nose: Nose normal.  Mouth/Throat: Oropharynx is clear and moist. No oropharyngeal exudate.  Eyes: Conjunctivae and EOM are normal. Pupils are equal, round, and reactive to light. Right eye exhibits no discharge. Left eye exhibits no discharge. No scleral icterus.  Neck: Normal range of motion. Neck supple. No JVD present. No tracheal deviation present. No thyromegaly present.  Cardiovascular: Normal rate, regular rhythm, normal heart sounds and intact distal pulses.  Exam reveals no gallop and no friction rub.   No murmur heard. Pulmonary/Chest: Effort normal and breath sounds normal. No stridor. No respiratory distress. He has no wheezes. He has no rales. He exhibits no tenderness.  Abdominal: Soft. Bowel sounds are normal. He exhibits no distension and no mass. There is no tenderness. There is no rebound and no guarding.  Genitourinary: Rectum normal, prostate normal and penis normal. Rectal exam shows guaiac negative stool. No penile tenderness.  Musculoskeletal: Normal range of motion. He exhibits no edema or tenderness.  Lymphadenopathy:    He has no cervical adenopathy.  Neurological: He is alert and oriented to person, place, and time. He has normal reflexes. No cranial nerve deficit. He exhibits normal muscle tone. Coordination normal.  Skin: Skin is warm and dry. No rash noted. He is not diaphoretic. No erythema. No pallor.  Psychiatric: He has a normal mood and affect. His behavior is normal. Judgment and thought content normal.  Rectal - per Urology LS, muscles - tender  Lab Results  Component Value Date   WBC 6.6 06/29/2016   HGB 14.7 06/29/2016   HCT 42.2 06/29/2016   PLT 254.0 06/29/2016   GLUCOSE 117 (H)  06/29/2016   CHOL 174 06/29/2016   TRIG 148.0 06/29/2016   HDL 55.50 06/29/2016   LDLCALC 89 06/29/2016   ALT 26 06/29/2016   AST 16 06/29/2016   NA 142 06/29/2016   K 4.5 06/29/2016   CL 106 06/29/2016   CREATININE 0.96 06/29/2016   BUN 9 06/29/2016   CO2 30 06/29/2016   TSH 1.84 06/29/2016   PSA 0.76 06/29/2016   INR 0.96 06/30/2013   HGBA1C 5.6 01/15/2009    Mm Digital Diagnostic Bilat  Result Date: 01/21/2014 CLINICAL DATA:  Palpable right breast mass, 66 year old male. History of a recent fall with trauma to the right chest wall. The patient takes multiple medications including finasteride, which has been associated with gynecomastia. EXAM: DIGITAL DIAGNOSTIC  bilateral MAMMOGRAM WITH CAD COMPARISON:  None. ACR Breast Density Category a: The breast tissue is almost entirely fatty. FINDINGS: Right greater  than left benign appearing retroareolar gynecomastia is identified without suspicious imaging feature. Mammographic images were processed with CAD. IMPRESSION: Right remote left mild gynecomastia. Clinical followup is suggested. Of the patient's medications provided on a list he brought with him today, finasteride is most likely to be associated with gynecomastia. RECOMMENDATION: Clinical followup I have discussed the findings and recommendations with the patient. Results were also provided in writing at the conclusion of the visit. If applicable, a reminder letter will be sent to the patient regarding the next appointment. BI-RADS CATEGORY  1: Negative. Electronically Signed   By: Conchita Paris M.D.   On: 01/21/2014 14:50    Assessment & Plan:   Diagnoses and all orders for this visit:  Need for pneumococcal vaccination -     Pneumococcal conjugate vaccine 13-valent   I have discontinued Mr. Sunderland's Probiotic Product (ALIGN PO), celecoxib, LORazepam, docusate sodium, acetaminophen, and bisacodyl. I am also having him maintain his vitamin B-12, finasteride, Vitamin D-3,  aspirin EC, DULoxetine, lovastatin, tamsulosin, magnesium gluconate, losartan, buPROPion, temazepam, diazepam, Oxycodone HCl, and fentaNYL.  No orders of the defined types were placed in this encounter.    Follow-up: No Follow-up on file.  Walker Kehr, MD

## 2016-07-10 NOTE — Telephone Encounter (Signed)
Patient called back. He states everything has been taken care of. He got all his rx's. He just said for future he would like the [employer] at [employer]. Thank you.

## 2016-07-10 NOTE — Telephone Encounter (Signed)
LMTCB

## 2016-07-19 DIAGNOSIS — R310 Gross hematuria: Secondary | ICD-10-CM | POA: Diagnosis not present

## 2016-07-24 DIAGNOSIS — R310 Gross hematuria: Secondary | ICD-10-CM | POA: Diagnosis not present

## 2016-07-24 DIAGNOSIS — R319 Hematuria, unspecified: Secondary | ICD-10-CM | POA: Diagnosis not present

## 2016-07-27 ENCOUNTER — Telehealth: Payer: Self-pay | Admitting: Internal Medicine

## 2016-07-27 DIAGNOSIS — R310 Gross hematuria: Secondary | ICD-10-CM | POA: Diagnosis not present

## 2016-07-27 NOTE — Telephone Encounter (Signed)
Pt was summoned for [employer] [place] duty and would like a Letter excusing him because of his anxiety.    Jury duty is [place]

## 2016-07-27 NOTE — Telephone Encounter (Signed)
OK - need his jury date and # Thx

## 2016-07-27 NOTE — Telephone Encounter (Signed)
Okay to write

## 2016-07-28 NOTE — Telephone Encounter (Signed)
Pt notified letter ready 

## 2016-08-04 ENCOUNTER — Telehealth: Payer: Self-pay | Admitting: *Deleted

## 2016-08-04 NOTE — Telephone Encounter (Signed)
Rec'd fax for refill on Baclofen 20 mg to take 1 three times a day # [phone]. Med is not on med list pls advise...Johny Chess

## 2016-08-04 NOTE — Telephone Encounter (Signed)
OK w/1 ref Corrin Parker

## 2016-08-07 MED ORDER — BACLOFEN 20 MG PO TABS: 20.0000 mg | ORAL_TABLET | Freq: Three times a day (TID) | ORAL | 1 refills | Status: DC

## 2016-08-07 NOTE — Telephone Encounter (Signed)
Added med back to med list [facility]...Johny Chess

## 2016-08-17 ENCOUNTER — Encounter: Payer: Self-pay | Admitting: Gastroenterology

## 2016-08-30 ENCOUNTER — Other Ambulatory Visit: Payer: Self-pay

## 2016-08-30 MED ORDER — BUPROPION HCL ER (XL) 150 MG PO TB24: 300.0000 mg | ORAL_TABLET | Freq: Every day | ORAL | 1 refills | Status: DC

## 2016-09-07 ENCOUNTER — Encounter: Payer: Self-pay | Admitting: Gastroenterology

## 2016-09-26 ENCOUNTER — Ambulatory Visit (AMBULATORY_SURGERY_CENTER): Payer: Self-pay

## 2016-09-26 VITALS — Ht 73.0 in | Wt 225.4 lb

## 2016-09-26 DIAGNOSIS — Z1211 Encounter for screening for malignant neoplasm of colon: Secondary | ICD-10-CM

## 2016-09-26 MED ORDER — NA SULFATE-K SULFATE-MG SULF 17.5-3.13-1.6 GM/177ML PO SOLN: ORAL | 0 refills | Status: DC

## 2016-09-26 NOTE — Progress Notes (Signed)
Per pt, no allergies to soy or egg products.Pt not taking any weight loss meds or using  O2 at home.  Pt refused Emmi video.  Pt states he had a hard time being sedated with last colonoscopy.

## 2016-10-03 ENCOUNTER — Encounter: Payer: Self-pay | Admitting: Internal Medicine

## 2016-10-03 ENCOUNTER — Ambulatory Visit (INDEPENDENT_AMBULATORY_CARE_PROVIDER_SITE_OTHER): Payer: Medicare Other | Admitting: Internal Medicine

## 2016-10-03 DIAGNOSIS — F419 Anxiety disorder, unspecified: Secondary | ICD-10-CM

## 2016-10-03 DIAGNOSIS — B079 Viral wart, unspecified: Secondary | ICD-10-CM

## 2016-10-03 DIAGNOSIS — I10 Essential (primary) hypertension: Secondary | ICD-10-CM

## 2016-10-03 DIAGNOSIS — F331 Major depressive disorder, recurrent, moderate: Secondary | ICD-10-CM

## 2016-10-03 DIAGNOSIS — E538 Deficiency of other specified B group vitamins: Secondary | ICD-10-CM | POA: Diagnosis not present

## 2016-10-03 MED ORDER — OXYCODONE HCL 10 MG PO TABS: 5.0000 mg | ORAL_TABLET | Freq: Three times a day (TID) | ORAL | 0 refills | Status: DC | PRN

## 2016-10-03 MED ORDER — TEMAZEPAM 30 MG PO CAPS: ORAL_CAPSULE | ORAL | 5 refills | Status: DC

## 2016-10-03 MED ORDER — FENTANYL 25 MCG/HR TD PT72: 25.0000 ug | MEDICATED_PATCH | TRANSDERMAL | 0 refills | Status: DC

## 2016-10-03 NOTE — Assessment & Plan Note (Signed)
On B12 

## 2016-10-03 NOTE — Progress Notes (Signed)
Subjective:  Patient ID: Lee Owens, male    DOB: [birth year]  Age: 66 y.o. MRN: [id number]  CC: No chief complaint on file.   HPI Lee Owens presents for spasms, pain, anxiety, depression f/u.  Outpatient Medications Prior to Visit  Medication Sig Dispense Refill  . aspirin EC 81 MG tablet Take 81 mg by mouth daily. Reported on 04/07/2015    . baclofen (LIORESAL) 20 MG tablet Take 1 tablet (20 mg total) by mouth 3 (three) times daily. 90 each 1  . buPROPion (WELLBUTRIN XL) 150 MG 24 hr tablet Take 2 tablets (300 mg total) by mouth daily. 180 tablet 1  . Cholecalciferol (VITAMIN D-3) 5000 UNITS TABS Take 5,000 Units by mouth daily.    . diazepam (VALIUM) 5 MG tablet Take 1 tablet (5 mg total) by mouth every 6 (six) hours as needed for anxiety. 120 tablet 3  . DULoxetine (CYMBALTA) 60 MG capsule Take 1 capsule (60 mg total) by mouth 2 (two) times daily. 180 capsule 3  . fentaNYL (DURAGESIC) 25 MCG/HR patch Place 1 patch (25 mcg total) onto the skin every other day. Please fill on or after 09/05/16 15 patch 0  . finasteride (PROSCAR) 5 MG tablet Take 5 mg by mouth daily.    Marland Kitchen lovastatin (MEVACOR) 40 MG tablet Take 1 tablet (40 mg total) by mouth at bedtime. 90 tablet 3  . magnesium gluconate (MAGONATE) 30 MG tablet Take 1 tablet (30 mg total) by mouth 2 (two) times daily. 60 tablet 5  . Na Sulfate-K Sulfate-Mg Sulf (SUPREP BOWEL PREP KIT) 17.5-3.13-1.6 GM/180ML SOLN Suprep as directed / no substitutions 354 mL 0  . Oxycodone HCl 10 MG TABS Take 0.5-1 tablets (5-10 mg total) by mouth 3 (three) times daily as needed. 90 each 0  . tamsulosin (FLOMAX) 0.4 MG CAPS capsule Take 1 capsule by mouth daily.  2  . temazepam (RESTORIL) 30 MG capsule TAKE 1-2 CAPLETS BY MOUTH AT BEDTIME AS NEEDED FOR SLEEP. 15 capsule 5  . vitamin B-12 (CYANOCOBALAMIN) 1000 MCG tablet Take 2,000 mcg by mouth daily.      No facility-administered medications prior to visit.     ROS Review of Systems    Constitutional: Negative for appetite change, fatigue and unexpected weight change.  HENT: Negative for congestion, nosebleeds, sneezing, sore throat and trouble swallowing.   Eyes: Negative for itching and visual disturbance.  Respiratory: Negative for cough.   Cardiovascular: Negative for chest pain, palpitations and leg swelling.  Gastrointestinal: Negative for abdominal distention, blood in stool, diarrhea and nausea.  Genitourinary: Negative for frequency and hematuria.  Musculoskeletal: Positive for arthralgias, back pain and gait problem. Negative for joint swelling and neck pain.  Skin: Negative for rash.  Neurological: Negative for dizziness, tremors, speech difficulty and weakness.  Psychiatric/Behavioral: Negative for agitation, dysphoric mood and sleep disturbance. The patient is not nervous/anxious.     Objective:  BP 128/82 (BP Location: Right Arm, Patient Position: Sitting, Cuff Size: Large)   Pulse 80   Temp 98.1 F (36.7 C) (Oral)   Ht 6' 1" (1.854 m)   Wt 224 lb (101.6 kg)   SpO2 95%   BMI 29.55 kg/m   BP Readings from Last 3 Encounters:  10/03/16 128/82  07/05/16 110/68  04/05/16 110/60    Wt Readings from Last 3 Encounters:  10/03/16 224 lb (101.6 kg)  09/26/16 225 lb 6.4 oz (102.2 kg)  07/05/16 221 lb (100.2 kg)    Physical Exam  Constitutional: He  is oriented to person, place, and time. He appears well-developed. No distress.  NAD  HENT:  Mouth/Throat: Oropharynx is clear and moist.  Eyes: Conjunctivae are normal. Pupils are equal, round, and reactive to light.  Neck: Normal range of motion. No JVD present. No thyromegaly present.  Cardiovascular: Normal rate, regular rhythm, normal heart sounds and intact distal pulses.  Exam reveals no gallop and no friction rub.   No murmur heard. Pulmonary/Chest: Effort normal and breath sounds normal. No respiratory distress. He has no wheezes. He has no rales. He exhibits no tenderness.  Abdominal: Soft.  Bowel sounds are normal. He exhibits no distension and no mass. There is no tenderness. There is no rebound and no guarding.  Musculoskeletal: Normal range of motion. He exhibits tenderness. He exhibits no edema.  Lymphadenopathy:    He has no cervical adenopathy.  Neurological: He is alert and oriented to person, place, and time. He has normal reflexes. No cranial nerve deficit. He exhibits normal muscle tone. He displays a negative Romberg sign. Coordination abnormal. Gait normal.  Skin: Skin is warm and dry. No rash noted.  Psychiatric: He has a normal mood and affect. His behavior is normal. Judgment and thought content normal.  warts   Procedure Note :     Procedure : Cryosurgery   Indication:  Wart(s)     Risks including unsuccessful procedure , bleeding, infection, bruising, scar, a need for a repeat  procedure and others were explained to the patient in detail as well as the benefits. Informed consent was obtained verbally.    4 lesion(s)  on forehead and chest   was/were treated with liquid nitrogen on a Q-tip in a usual fasion . Band-Aid was applied and antibiotic ointment was given for a later use.   Tolerated well. Complications none.   Postprocedure instructions :     Keep the wounds clean. You can wash them with liquid soap and water. Pat dry with gauze or a Kleenex tissue  Before applying antibiotic ointment and a Band-Aid.   You need to report immediately  if  any signs of infection develop.     Lab Results  Component Value Date   WBC 6.6 06/29/2016   HGB 14.7 06/29/2016   HCT 42.2 06/29/2016   PLT 254.0 06/29/2016   GLUCOSE 117 (H) 06/29/2016   CHOL 174 06/29/2016   TRIG 148.0 06/29/2016   HDL 55.50 06/29/2016   LDLCALC 89 06/29/2016   ALT 26 06/29/2016   AST 16 06/29/2016   NA 142 06/29/2016   K 4.5 06/29/2016   CL 106 06/29/2016   CREATININE 0.96 06/29/2016   BUN 9 06/29/2016   CO2 30 06/29/2016   TSH 1.84 06/29/2016   PSA 0.76 06/29/2016   INR  0.96 06/30/2013   HGBA1C 5.6 01/15/2009    Mm Digital Diagnostic Bilat  Result Date: 01/21/2014 CLINICAL DATA:  Palpable right breast mass, 63-year-old male. History of a recent fall with trauma to the right chest wall. The patient takes multiple medications including finasteride, which has been associated with gynecomastia. EXAM: DIGITAL DIAGNOSTIC  bilateral MAMMOGRAM WITH CAD COMPARISON:  None. ACR Breast Density Category a: The breast tissue is almost entirely fatty. FINDINGS: Right greater than left benign appearing retroareolar gynecomastia is identified without suspicious imaging feature. Mammographic images were processed with CAD. IMPRESSION: Right remote left mild gynecomastia. Clinical followup is suggested. Of the patient's medications provided on a list he brought with him today, finasteride is most likely to be associated with   gynecomastia. RECOMMENDATION: Clinical followup I have discussed the findings and recommendations with the patient. Results were also provided in writing at the conclusion of the visit. If applicable, a reminder letter will be sent to the patient regarding the next appointment. BI-RADS CATEGORY  1: Negative. Electronically Signed   By: Conchita Paris M.D.   On: 01/21/2014 14:50    Assessment & Plan:   There are no diagnoses linked to this encounter. I am having Mr. Wassenaar maintain his vitamin B-12, finasteride, Vitamin D-3, aspirin EC, lovastatin, tamsulosin, magnesium gluconate, temazepam, diazepam, DULoxetine, Oxycodone HCl, fentaNYL, baclofen, buPROPion, and Na Sulfate-K Sulfate-Mg Sulf.  No orders of the defined types were placed in this encounter.    Follow-up: No Follow-up on file.  Walker Kehr, MD

## 2016-10-03 NOTE — Assessment & Plan Note (Signed)
Diazepam prn  Potential benefits of a long term benzodiazepines  use as well as potential risks  and complications were explained to the patient and were aknowledged. 

## 2016-10-03 NOTE — Assessment & Plan Note (Signed)
BP Readings from Last 3 Encounters:  10/03/16 128/82  07/05/16 110/68  04/05/16 110/60

## 2016-10-03 NOTE — Assessment & Plan Note (Signed)
Cymbalta, Wellbutrin 

## 2016-10-11 ENCOUNTER — Ambulatory Visit (AMBULATORY_SURGERY_CENTER): Payer: Medicare Other | Admitting: Gastroenterology

## 2016-10-11 ENCOUNTER — Encounter: Payer: Self-pay | Admitting: Gastroenterology

## 2016-10-11 VITALS — BP 133/73 | HR 57 | Temp 98.9°F | Resp 11 | Ht 73.0 in | Wt 225.0 lb

## 2016-10-11 DIAGNOSIS — Z1211 Encounter for screening for malignant neoplasm of colon: Secondary | ICD-10-CM

## 2016-10-11 DIAGNOSIS — Z1212 Encounter for screening for malignant neoplasm of rectum: Secondary | ICD-10-CM

## 2016-10-11 DIAGNOSIS — F419 Anxiety disorder, unspecified: Secondary | ICD-10-CM | POA: Diagnosis not present

## 2016-10-11 DIAGNOSIS — D123 Benign neoplasm of transverse colon: Secondary | ICD-10-CM

## 2016-10-11 DIAGNOSIS — I10 Essential (primary) hypertension: Secondary | ICD-10-CM | POA: Diagnosis not present

## 2016-10-11 HISTORY — PX: COLONOSCOPY: SHX174

## 2016-10-11 MED ORDER — SODIUM CHLORIDE 0.9 % IV SOLN: 500.0000 mL | INTRAVENOUS | Status: AC

## 2016-10-11 NOTE — Op Note (Addendum)
[facility] Patient Name: Lee Owens Procedure Date: 10/11/2016 2:38 PM MRN: [id number] Endoscopist: Mauri Pole , MD Age: 66 Referring MD:  Date of Birth: 18-Jun-1950 Gender: Male Account #: [id number] Procedure:                Colonoscopy Indications:              Screening for colorectal malignant neoplasm, Last                            colonoscopy: 2008 Medicines:                Monitored Anesthesia Care Procedure:                Pre-Anesthesia Assessment:                           - Prior to the procedure, a History and Physical                            was performed, and patient medications and                            allergies were reviewed. The patient's tolerance of                            previous anesthesia was also reviewed. The risks                            and benefits of the procedure and the sedation                            options and risks were discussed with the patient.                            All questions were answered, and informed consent                            was obtained. Prior Anticoagulants: The patient has                            taken no previous anticoagulant or antiplatelet                            agents. ASA Grade Assessment: II - A patient with                            mild systemic disease. After reviewing the risks                            and benefits, the patient was deemed in                            satisfactory condition to undergo the procedure.  After obtaining informed consent, the colonoscope                            was passed under direct vision. Throughout the                            procedure, the patient's blood pressure, pulse, and                            oxygen saturations were monitored continuously. The                            Colonoscope was introduced through the anus and                            advanced to the the cecum, identified by                             appendiceal orifice and ileocecal valve. The                            colonoscopy was performed without difficulty. The                            patient tolerated the procedure well. The quality                            of the bowel preparation was excellent. The                            ileocecal valve, appendiceal orifice, and rectum                            were photographed. Scope In: 2:43:18 PM Scope Out: 2:57:51 PM Scope Withdrawal Time: 0 hours 11 minutes 44 seconds  Total Procedure Duration: 0 hours 14 minutes 33 seconds  Findings:                 The perianal and digital rectal examinations were                            normal.                           A 5 mm polyp was found in the transverse colon. The                            polyp was sessile. The polyp was removed with a                            cold snare. Resection and retrieval were complete.                           Multiple small and large-mouthed diverticula were  found in the sigmoid colon, descending colon and                            transverse colon.                           Non-bleeding internal hemorrhoids were found during                            retroflexion. The hemorrhoids were small.                           The exam was otherwise without abnormality. Complications:            No immediate complications. Estimated Blood Loss:     Estimated blood loss was minimal. Impression:               - One 5 mm polyp in the transverse colon, removed                            with a cold snare. Resected and retrieved.                           - Moderate diverticulosis in the sigmoid colon, in                            the descending colon and in the transverse colon.                           - Non-bleeding internal hemorrhoids.                           - The examination was otherwise normal. Recommendation:           - Patient has a  contact number available for                            emergencies. The signs and symptoms of potential                            delayed complications were discussed with the                            patient. Return to normal activities tomorrow.                            Written discharge instructions were provided to the                            patient.                           - Resume previous diet.                           - Continue present medications.                           -  Await pathology results.                           - Repeat colonoscopy in 5-10 years for surveillance                            based on pathology results. Mauri Pole, MD 10/11/2016 3:01:39 PM This report has been signed electronically.

## 2016-10-11 NOTE — Progress Notes (Signed)
To PACU, vss patent aw report to rn 

## 2016-10-11 NOTE — Patient Instructions (Signed)
**  Handouts given on polyps, diverticulosis, and a High fiber diet**   YOU HAD AN ENDOSCOPIC PROCEDURE TODAY: Refer to the procedure report and other information in the discharge instructions given to you for any specific questions about what was found during the examination. If this information does not answer your questions, please call [facility] office at [phone] to clarify.   YOU SHOULD EXPECT: Some feelings of bloating in the abdomen. Passage of more gas than usual. Walking can help get rid of the air that was put into your GI tract during the procedure and reduce the bloating. If you had a lower endoscopy (such as a colonoscopy or flexible sigmoidoscopy) you may notice spotting of blood in your stool or on the toilet paper. Some abdominal soreness may be present for a day or two, also.  DIET: Your first meal following the procedure should be a light meal and then it is ok to progress to your normal diet. A half-sandwich or bowl of soup is an example of a good first meal. Heavy or fried foods are harder to digest and may make you feel nauseous or bloated. Drink plenty of fluids but you should avoid alcoholic beverages for 24 hours. If you had a esophageal dilation, please see attached instructions for diet.    ACTIVITY: Your care partner should take you home directly after the procedure. You should plan to take it easy, moving slowly for the rest of the day. You can resume normal activity the day after the procedure however YOU SHOULD NOT DRIVE, use power tools, machinery or perform tasks that involve climbing or major physical exertion for 24 hours (because of the sedation medicines used during the test).   SYMPTOMS TO REPORT IMMEDIATELY: A gastroenterologist can be reached at any hour. Please call [phone]  for any of the following symptoms:  Following lower endoscopy (colonoscopy, flexible sigmoidoscopy) Excessive amounts of blood in the stool  Significant tenderness, worsening of  abdominal pains  Swelling of the abdomen that is new, acute  Fever of 100 or higher    FOLLOW UP:  If any biopsies were taken you will be contacted by phone or by letter within the next 1-3 weeks. Call [phone]  if you have not heard about the biopsies in 3 weeks.  Please also call with any specific questions about appointments or follow up tests.

## 2016-10-11 NOTE — Progress Notes (Signed)
Called to room to assist during endoscopic procedure.  Patient ID and intended procedure confirmed with present staff. Received instructions for my participation in the procedure from the performing physician.  

## 2016-10-12 ENCOUNTER — Telehealth: Payer: Self-pay | Admitting: *Deleted

## 2016-10-12 NOTE — Telephone Encounter (Signed)
  Follow up Call-  Call back number 10/11/2016  Post procedure Call Back phone  # [phone]  Permission to leave phone message Yes  Some recent data might be hidden     Patient questions: spoke with wife  Do you have a fever, pain , or abdominal swelling? No. Pain Score  0 *  Have you tolerated food without any problems? Yes.    Have you been able to return to your normal activities? Yes.    Do you have any questions about your discharge instructions: Diet   No. Medications  No. Follow up visit  No.  Do you have questions or concerns about your Care? No.  Actions: * If pain score is 4 or above: No action needed, pain <4.

## 2016-10-22 ENCOUNTER — Encounter: Payer: Self-pay | Admitting: Gastroenterology

## 2016-11-01 DIAGNOSIS — R3911 Hesitancy of micturition: Secondary | ICD-10-CM | POA: Diagnosis not present

## 2016-11-01 DIAGNOSIS — R972 Elevated prostate specific antigen [PSA]: Secondary | ICD-10-CM | POA: Diagnosis not present

## 2016-11-01 DIAGNOSIS — N401 Enlarged prostate with lower urinary tract symptoms: Secondary | ICD-10-CM | POA: Diagnosis not present

## 2016-11-28 ENCOUNTER — Other Ambulatory Visit: Payer: Self-pay | Admitting: Internal Medicine

## 2016-11-28 ENCOUNTER — Encounter: Payer: Self-pay | Admitting: Internal Medicine

## 2016-11-28 DIAGNOSIS — M545 Low back pain: Principal | ICD-10-CM

## 2016-11-28 DIAGNOSIS — G8929 Other chronic pain: Secondary | ICD-10-CM

## 2016-12-06 ENCOUNTER — Telehealth: Payer: Self-pay

## 2016-12-06 NOTE — Telephone Encounter (Signed)
[facility] called and stated they only had 2 out of the 3 boxes need to fill the RX for pt fentanyl patches, they are trying to order the other box for the patient so he can get it tomorrow but pt may have to call back and get an early refill.   FYI

## 2016-12-15 DIAGNOSIS — M5442 Lumbago with sciatica, left side: Secondary | ICD-10-CM | POA: Diagnosis not present

## 2016-12-15 DIAGNOSIS — M5441 Lumbago with sciatica, right side: Secondary | ICD-10-CM | POA: Diagnosis not present

## 2016-12-15 DIAGNOSIS — G8929 Other chronic pain: Secondary | ICD-10-CM | POA: Diagnosis not present

## 2016-12-22 DIAGNOSIS — G8929 Other chronic pain: Secondary | ICD-10-CM | POA: Diagnosis not present

## 2016-12-22 DIAGNOSIS — M5442 Lumbago with sciatica, left side: Secondary | ICD-10-CM | POA: Diagnosis not present

## 2016-12-22 DIAGNOSIS — M5441 Lumbago with sciatica, right side: Secondary | ICD-10-CM | POA: Diagnosis not present

## 2017-01-01 DIAGNOSIS — M5441 Lumbago with sciatica, right side: Secondary | ICD-10-CM | POA: Diagnosis not present

## 2017-01-01 DIAGNOSIS — M4802 Spinal stenosis, cervical region: Secondary | ICD-10-CM | POA: Diagnosis not present

## 2017-01-01 DIAGNOSIS — M5442 Lumbago with sciatica, left side: Secondary | ICD-10-CM | POA: Diagnosis not present

## 2017-01-01 DIAGNOSIS — G8929 Other chronic pain: Secondary | ICD-10-CM | POA: Diagnosis not present

## 2017-01-02 DIAGNOSIS — M4802 Spinal stenosis, cervical region: Secondary | ICD-10-CM | POA: Diagnosis not present

## 2017-01-03 ENCOUNTER — Ambulatory Visit (INDEPENDENT_AMBULATORY_CARE_PROVIDER_SITE_OTHER): Payer: Medicare Other | Admitting: Internal Medicine

## 2017-01-03 ENCOUNTER — Encounter: Payer: Self-pay | Admitting: Internal Medicine

## 2017-01-03 VITALS — BP 146/88 | HR 68 | Temp 98.3°F | Ht 73.0 in | Wt 225.0 lb

## 2017-01-03 DIAGNOSIS — M5442 Lumbago with sciatica, left side: Secondary | ICD-10-CM | POA: Diagnosis not present

## 2017-01-03 DIAGNOSIS — Z23 Encounter for immunization: Secondary | ICD-10-CM | POA: Diagnosis not present

## 2017-01-03 DIAGNOSIS — E538 Deficiency of other specified B group vitamins: Secondary | ICD-10-CM

## 2017-01-03 DIAGNOSIS — F419 Anxiety disorder, unspecified: Secondary | ICD-10-CM

## 2017-01-03 DIAGNOSIS — M545 Low back pain, unspecified: Secondary | ICD-10-CM | POA: Insufficient documentation

## 2017-01-03 DIAGNOSIS — G8929 Other chronic pain: Secondary | ICD-10-CM

## 2017-01-03 DIAGNOSIS — E785 Hyperlipidemia, unspecified: Secondary | ICD-10-CM | POA: Diagnosis not present

## 2017-01-03 DIAGNOSIS — I10 Essential (primary) hypertension: Secondary | ICD-10-CM

## 2017-01-03 DIAGNOSIS — M5441 Lumbago with sciatica, right side: Secondary | ICD-10-CM | POA: Diagnosis not present

## 2017-01-03 MED ORDER — OXYCODONE HCL 10 MG PO TABS: 5.0000 mg | ORAL_TABLET | Freq: Three times a day (TID) | ORAL | 0 refills | Status: DC | PRN

## 2017-01-03 MED ORDER — LOVASTATIN 40 MG PO TABS: 40.0000 mg | ORAL_TABLET | Freq: Every day | ORAL | 3 refills | Status: DC

## 2017-01-03 MED ORDER — FENTANYL 25 MCG/HR TD PT72: 25.0000 ug | MEDICATED_PATCH | TRANSDERMAL | 0 refills | Status: DC

## 2017-01-03 NOTE — Progress Notes (Signed)
Subjective:  Patient ID: Lee Owens, male    DOB: 01-15-1951  Age: 66 y.o. MRN: [id number]  CC: No chief complaint on file.   HPI Lee Owens presents for depression, LBP, spasticity C/o a spot on R arm  Outpatient Medications Prior to Visit  Medication Sig Dispense Refill  . aspirin EC 81 MG tablet Take 81 mg by mouth daily. Reported on 04/07/2015    . baclofen (LIORESAL) 20 MG tablet Take 1 tablet (20 mg total) by mouth 3 (three) times daily. 90 each 1  . buPROPion (WELLBUTRIN XL) 150 MG 24 hr tablet Take 2 tablets (300 mg total) by mouth daily. 180 tablet 1  . Cholecalciferol (VITAMIN D-3) 5000 UNITS TABS Take 5,000 Units by mouth daily.    . diazepam (VALIUM) 5 MG tablet Take 1 tablet (5 mg total) by mouth every 6 (six) hours as needed for anxiety. 120 tablet 3  . DULoxetine (CYMBALTA) 60 MG capsule Take 1 capsule (60 mg total) by mouth 2 (two) times daily. 180 capsule 3  . fentaNYL (DURAGESIC) 25 MCG/HR patch Place 1 patch (25 mcg total) onto the skin every other day. Please fill on or after 12/06/16 15 patch 0  . finasteride (PROSCAR) 5 MG tablet Take 5 mg by mouth daily.    Marland Kitchen lovastatin (MEVACOR) 40 MG tablet Take 1 tablet (40 mg total) by mouth at bedtime. 90 tablet 3  . magnesium gluconate (MAGONATE) 30 MG tablet Take 1 tablet (30 mg total) by mouth 2 (two) times daily. 60 tablet 5  . Oxycodone HCl 10 MG TABS Take 0.5-1 tablets (5-10 mg total) by mouth 3 (three) times daily as needed. 90 each 0  . tamsulosin (FLOMAX) 0.4 MG CAPS capsule Take 1 capsule by mouth daily.  2  . temazepam (RESTORIL) 30 MG capsule TAKE 1-2 CAPLETS BY MOUTH AT BEDTIME AS NEEDED FOR SLEEP. 30 capsule 5  . vitamin B-12 (CYANOCOBALAMIN) 1000 MCG tablet Take 2,000 mcg by mouth daily.      Facility-Administered Medications Prior to Visit  Medication Dose Route Frequency Provider Last Rate Last Dose  . 0.9 %  sodium chloride infusion  500 mL Intravenous Continuous Nandigam, Kavitha V, MD         ROS Review of Systems  Constitutional: Positive for fatigue. Negative for appetite change and unexpected weight change.  HENT: Negative for congestion, nosebleeds, sneezing, sore throat and trouble swallowing.   Eyes: Negative for itching and visual disturbance.  Respiratory: Negative for cough.   Cardiovascular: Negative for chest pain, palpitations and leg swelling.  Gastrointestinal: Negative for abdominal distention, blood in stool, diarrhea and nausea.  Genitourinary: Negative for frequency and hematuria.  Musculoskeletal: Positive for arthralgias, back pain, gait problem, myalgias, neck pain and neck stiffness. Negative for joint swelling.  Skin: Negative for rash.  Neurological: Positive for weakness. Negative for dizziness, tremors and speech difficulty.  Psychiatric/Behavioral: Positive for sleep disturbance. Negative for agitation and dysphoric mood. The patient is nervous/anxious.     Objective:  BP (!) 146/88 (BP Location: Left Arm, Patient Position: Sitting, Cuff Size: Large)   Pulse 68   Temp 98.3 F (36.8 C) (Oral)   Ht 6\' 1"  (1.854 m)   Wt 225 lb (102.1 kg)   SpO2 98%   BMI 29.69 kg/m   BP Readings from Last 3 Encounters:  01/03/17 (!) 146/88  10/11/16 133/73  10/03/16 128/82    Wt Readings from Last 3 Encounters:  01/03/17 225 lb (102.1 kg)  10/11/16 225  lb (102.1 kg)  10/03/16 224 lb (101.6 kg)    Physical Exam  Constitutional: He is oriented to person, place, and time. He appears well-developed. No distress.  NAD  HENT:  Mouth/Throat: Oropharynx is clear and moist.  Eyes: Pupils are equal, round, and reactive to light. Conjunctivae are normal.  Neck: Normal range of motion. No JVD present. No thyromegaly present.  Cardiovascular: Normal rate, regular rhythm, normal heart sounds and intact distal pulses.  Exam reveals no gallop and no friction rub.   No murmur heard. Pulmonary/Chest: Effort normal and breath sounds normal. No respiratory  distress. He has no wheezes. He has no rales. He exhibits no tenderness.  Abdominal: Soft. Bowel sounds are normal. He exhibits no distension and no mass. There is no tenderness. There is no rebound and no guarding.  Musculoskeletal: Normal range of motion. He exhibits tenderness. He exhibits no edema.  Lymphadenopathy:    He has no cervical adenopathy.  Neurological: He is alert and oriented to person, place, and time. He has normal reflexes. No cranial nerve deficit. He exhibits normal muscle tone. He displays a negative Romberg sign. Coordination abnormal. Gait normal.  Skin: Skin is warm and dry. No rash noted.  Psychiatric: He has a normal mood and affect. His behavior is normal. Judgment and thought content normal.    Lab Results  Component Value Date   WBC 6.6 06/29/2016   HGB 14.7 06/29/2016   HCT 42.2 06/29/2016   PLT 254.0 06/29/2016   GLUCOSE 117 (H) 06/29/2016   CHOL 174 06/29/2016   TRIG 148.0 06/29/2016   HDL 55.50 06/29/2016   LDLCALC 89 06/29/2016   ALT 26 06/29/2016   AST 16 06/29/2016   NA 142 06/29/2016   K 4.5 06/29/2016   CL 106 06/29/2016   CREATININE 0.96 06/29/2016   BUN 9 06/29/2016   CO2 30 06/29/2016   TSH 1.84 06/29/2016   PSA 0.76 06/29/2016   INR 0.96 06/30/2013   HGBA1C 5.6 01/15/2009    Mm Digital Diagnostic Bilat  Result Date: 01/21/2014 CLINICAL DATA:  Palpable right breast mass, 66 year old male. History of a recent fall with trauma to the right chest wall. The patient takes multiple medications including finasteride, which has been associated with gynecomastia. EXAM: DIGITAL DIAGNOSTIC  bilateral MAMMOGRAM WITH CAD COMPARISON:  None. ACR Breast Density Category a: The breast tissue is almost entirely fatty. FINDINGS: Right greater than left benign appearing retroareolar gynecomastia is identified without suspicious imaging feature. Mammographic images were processed with CAD. IMPRESSION: Right remote left mild gynecomastia. Clinical followup  is suggested. Of the patient's medications provided on a list he brought with him today, finasteride is most likely to be associated with gynecomastia. RECOMMENDATION: Clinical followup I have discussed the findings and recommendations with the patient. Results were also provided in writing at the conclusion of the visit. If applicable, a reminder letter will be sent to the patient regarding the next appointment. BI-RADS CATEGORY  1: Negative. Electronically Signed   By: Conchita Paris M.D.   On: 01/21/2014 14:50    Assessment & Plan:   There are no diagnoses linked to this encounter. I am having Mr. Eckerman maintain his vitamin B-12, finasteride, Vitamin D-3, aspirin EC, lovastatin, tamsulosin, magnesium gluconate, diazepam, DULoxetine, baclofen, buPROPion, Oxycodone HCl, fentaNYL, and temazepam. We will continue to administer sodium chloride.  No orders of the defined types were placed in this encounter.    Follow-up: No Follow-up on file.  Walker Kehr, MD

## 2017-01-03 NOTE — Assessment & Plan Note (Signed)
Lovastatin 

## 2017-01-03 NOTE — Patient Instructions (Signed)
  Try water exercises, dry needling, PT

## 2017-01-03 NOTE — Addendum Note (Signed)
Addended by: Karren Cobble on: 01/03/2017 04:02 PM   Modules accepted: Orders

## 2017-01-03 NOTE — Assessment & Plan Note (Signed)
On Losartan 

## 2017-01-03 NOTE — Assessment & Plan Note (Signed)
B12/B complex

## 2017-01-03 NOTE — Assessment & Plan Note (Signed)
Diazepam prn  Potential benefits of a long term benzodiazepines  use as well as potential risks  and complications were explained to the patient and were aknowledged. 

## 2017-01-03 NOTE — Assessment & Plan Note (Signed)
Cymbalta, Wellbutrin

## 2017-01-03 NOTE — Assessment & Plan Note (Signed)
F/u w/Dr Kristen Loader exercises, Dry needling, PT

## 2017-01-09 DIAGNOSIS — M4802 Spinal stenosis, cervical region: Secondary | ICD-10-CM | POA: Diagnosis not present

## 2017-01-25 DIAGNOSIS — M5136 Other intervertebral disc degeneration, lumbar region: Secondary | ICD-10-CM | POA: Diagnosis not present

## 2017-02-05 ENCOUNTER — Telehealth: Payer: Self-pay | Admitting: Internal Medicine

## 2017-02-05 NOTE — Telephone Encounter (Signed)
Requesting refill on diazepam.  Patient uses [facility] on [address] at [place].

## 2017-02-06 MED ORDER — DIAZEPAM 5 MG PO TABS: 5.0000 mg | ORAL_TABLET | Freq: Four times a day (QID) | ORAL | 3 refills | Status: DC | PRN

## 2017-02-06 NOTE — Telephone Encounter (Signed)
RX faxed

## 2017-02-06 NOTE — Telephone Encounter (Signed)
Wife called in.  Notified.

## 2017-02-28 ENCOUNTER — Telehealth: Payer: Self-pay | Admitting: Internal Medicine

## 2017-02-28 MED ORDER — BUPROPION HCL ER (XL) 150 MG PO TB24: 300.0000 mg | ORAL_TABLET | Freq: Every day | ORAL | 1 refills | Status: DC

## 2017-02-28 NOTE — Telephone Encounter (Signed)
Pt is needing a refill on buPROPion (WELLBUTRIN XL) 150 MG 24 hr tablet sent to [facility].

## 2017-02-28 NOTE — Telephone Encounter (Signed)
Reviewed chart pt is up-to-date sent refills to [facility].../lmb  

## 2017-03-02 ENCOUNTER — Telehealth: Payer: Self-pay | Admitting: Internal Medicine

## 2017-03-02 NOTE — Telephone Encounter (Signed)
Copied from [place] [id number]. Topic: Quick Communication - Rx Refill/Question >> Mar 02, 2017 11:59 AM Yvette Rack wrote: Has the patient contacted their pharmacy? Yes.     (Agent: If no, request that the patient contact the pharmacy for the refill.) Refill on Fentanyl 25 mcg patch   Preferred Pharmacy (with phone number or street name): [facility], [address] AT [place]   Agent: Please be advised that RX refills may take up to 3 business days. We ask that you follow-up with your pharmacy.

## 2017-03-06 MED ORDER — FENTANYL 25 MCG/HR TD PT72: 25.0000 ug | MEDICATED_PATCH | TRANSDERMAL | 0 refills | Status: DC

## 2017-03-06 NOTE — Telephone Encounter (Signed)
Unable to email the Rx - Rx is being printed. The pt should have this Rx for 12/10 at home. Thx

## 2017-03-06 NOTE — Telephone Encounter (Signed)
Requesting Fentanyl patches OV: 01/03/17  Last refill: 01/03/17 for 15 patches.   Sending back to provider to fill
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
## 2017-03-09 ENCOUNTER — Encounter: Payer: Self-pay | Admitting: Diagnostic Neuroimaging

## 2017-03-09 ENCOUNTER — Ambulatory Visit (INDEPENDENT_AMBULATORY_CARE_PROVIDER_SITE_OTHER): Payer: Medicare Other | Admitting: Diagnostic Neuroimaging

## 2017-03-09 VITALS — BP 134/84 | HR 77 | Ht 73.0 in | Wt 219.6 lb

## 2017-03-09 DIAGNOSIS — R251 Tremor, unspecified: Secondary | ICD-10-CM

## 2017-03-09 DIAGNOSIS — M48062 Spinal stenosis, lumbar region with neurogenic claudication: Secondary | ICD-10-CM | POA: Diagnosis not present

## 2017-03-09 MED ORDER — ALPRAZOLAM 0.5 MG PO TABS: 0.5000 mg | ORAL_TABLET | ORAL | 0 refills | Status: DC | PRN

## 2017-03-09 NOTE — Progress Notes (Signed)
[facility]  PATIENT: Lee Owens DOB: 08-Jan-1951  REFERRING CLINICIAN: Trenton Gammon, MD HISTORY FROM: patient and wife and chart review REASON FOR VISIT: new consult    HISTORICAL  CHIEF COMPLAINT:  Chief Complaint  Patient presents with  . Gait Problem    rm 6, New Pt, wife- [name], " saw Dr Rolena Infante for back pain, wanted neuro eval for symptoms unexplained by MRI of back"    HISTORY OF PRESENT ILLNESS:   66 year old male here for evaluation of gait and balance difficulty.  Patient reports at least 15 years of intermittent whole body muscle pain and muscle spasm.  He has been on chronic pain management for many years.  For past 2 years patient has had low back pain, worsening with standing and walking.  Symptoms worsening over time.  If he stands and walks he may develop burning, pain and weakness in his thighs and legs.  Patient has had MRI of lumbar spine showing moderate spinal stenosis at L4-5 level, with additional multilevel degenerative changes.  Patient has been evaluated by orthopedic surgery.  However patient was noted to have gait ataxia, hyperreflexia, and upper extremity symptoms concerning for cervical myelopathy.  However MRI of the cervical spine showed no significant cervical myelopathy changes.  Therefore patient referred here to neurology clinic for further evaluation of other upper motor neuron etiologies.  Patient also reports intermittent tremors in his hands since he was in college.  These fluctuate depending on fatigue, stress, anxiety.  Patient does have some deterioration in handwriting and last few years.  No change in sense of smell or taste.  He has some constipation, related to opioid use.  He has some punching and kicking in his sleep.  He has family history of dementia and his sister.    REVIEW OF SYSTEMS: Full 14 system review of systems performed and negative with exception of: Fatigue blurred vision ringing in ears joint pain joint  swelling aching muscles urination problems BPH depression anxiety decreased energy dizziness tremor numbness weakness insomnia.  ALLERGIES: No Known Allergies  HOME MEDICATIONS: Outpatient Medications Prior to Visit  Medication Sig Dispense Refill  . buPROPion (WELLBUTRIN XL) 150 MG 24 hr tablet Take 2 tablets (300 mg total) by mouth daily. 180 tablet 1  . Cholecalciferol (VITAMIN D-3) 5000 UNITS TABS Take 5,000 Units by mouth daily.    . diazepam (VALIUM) 5 MG tablet Take 1 tablet (5 mg total) every 6 (six) hours as needed by mouth for anxiety. 120 tablet 3  . DULoxetine (CYMBALTA) 60 MG capsule Take 1 capsule (60 mg total) by mouth 2 (two) times daily. 180 capsule 3  . fentaNYL (DURAGESIC) 25 MCG/HR patch Place 1 patch (25 mcg total) onto the skin every other day. Please fill on or after 03/05/17 15 patch 0  . finasteride (PROSCAR) 5 MG tablet Take 5 mg by mouth daily.    Marland Kitchen lovastatin (MEVACOR) 40 MG tablet Take 1 tablet (40 mg total) by mouth at bedtime. 90 tablet 3  . Oxycodone HCl 10 MG TABS Take 0.5-1 tablets (5-10 mg total) by mouth 3 (three) times daily as needed. 90 each 0  . tamsulosin (FLOMAX) 0.4 MG CAPS capsule Take 1 capsule by mouth daily.  2  . temazepam (RESTORIL) 30 MG capsule TAKE 1-2 CAPLETS BY MOUTH AT BEDTIME AS NEEDED FOR SLEEP. 30 capsule 5  . vitamin B-12 (CYANOCOBALAMIN) 1000 MCG tablet Take 2,000 mcg by mouth daily.     Marland Kitchen amoxicillin (AMOXIL) 500 MG  tablet For dental exams  0  . baclofen (LIORESAL) 20 MG tablet Take 1 tablet (20 mg total) by mouth 3 (three) times daily. (Patient not taking: Reported on 03/09/2017) 90 each 1  . magnesium gluconate (MAGONATE) 30 MG tablet Take 1 tablet (30 mg total) by mouth 2 (two) times daily. (Patient not taking: Reported on 03/09/2017) 60 tablet 5  . NARCAN 4 MG/0.1ML LIQD nasal spray kit as needed.  0  . aspirin EC 81 MG tablet Take 81 mg by mouth daily. Reported on 04/07/2015     Facility-Administered Medications Prior to Visit    Medication Dose Route Frequency Provider Last Rate Last Dose  . 0.9 %  sodium chloride infusion  500 mL Intravenous Continuous Nandigam, Venia Minks, MD        PAST MEDICAL HISTORY: Past Medical History:  Diagnosis Date  . Allergic rhinitis   . Anxiety    takes Valium bid  . Arthritis    "hips, knees, hands" (07/07/2013)  . Back pain    occasionally but reason unknown  . BPH (benign prostatic hypertrophy)    takes Proscar daily  . Chronic fatigue   . Complication of anesthesia    Per pt, "Hard to sedate" past last colonoscopy in 2008!  . Depression    takes Cymbalta daily  . Diverticulosis   . Dizziness    due to dehydration  . Hemorrhoids   . Hyperlipidemia    takes Lovastatin daily  . Hypertension   . Insomnia    takes Restoril nightly   . Joint pain    knees/ankles/back pain  . Joint swelling   . Muscle pain    takes Baclofen daily  . Night muscle spasms    takes Valium as needed  . Pneumonia 1971  . Right knee DJD   . Urinary frequency    takes rapaflo daily  . Vitamin B12 deficiency 2009  . Vitamin D deficiency disease     PAST SURGICAL HISTORY: Past Surgical History:  Procedure Laterality Date  . COLONOSCOPY    . JOINT REPLACEMENT     both knees  . KNEE ARTHROSCOPY Bilateral 2012  . PROSTATE BIOPSY  01/2010  . 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 TOTAL KNEE ARTHROPLASTY Right 07/07/2013  . TOTAL KNEE ARTHROPLASTY Right 07/07/2013   Procedure: TOTAL KNEE ARTHROPLASTY- right;  Surgeon: Lorn Junes, MD;  Location: [place];  Service: Orthopedics;  Laterality: Right;    FAMILY HISTORY: Family History  Problem Relation Age of Onset  . Liver cancer Mother   . Heart failure Father   . Kidney disease Father   . Atrial fibrillation Father   . Coronary artery disease Father   . Hypertension Other   . Coronary artery  disease Other   . Dementia Sister     SOCIAL HISTORY:  Social History   Socioeconomic History  . Marital status: Married    Spouse name: ary  . Number of children: Not on file  . Years of education: Not on file  . Highest education level: Not on file  Social Needs  . Financial resource strain: Not on file  . Food insecurity - worry: Not on file  . Food insecurity - inability: Not on file  . Transportation needs - medical: Not on file  . Transportation needs - non-medical: Not on file  Occupational History  .  Occupation: Primary school teacher: SELF-EMPLOYED  Tobacco Use  . Smoking status: Former Smoker    Packs/day: 1.00    Years: 38.00    Pack years: 38.00    Types: Cigarettes    Last attempt to quit: 10/23/2005    Years since quitting: 11.3  . Smokeless tobacco: Never Used  . Tobacco comment: quit smoking 2008  Substance and Sexual Activity  . Alcohol use: Yes    Alcohol/week: 8.4 oz    Types: 14 Cans of beer per week    Comment: 03/09/17 "2 beers daily"  . Drug use: No    Comment: quit age 41  . Sexual activity: Not Currently  Other Topics Concern  . Not on file  Social History Narrative   Live with wife, MAry   Education: some college   Retired   No children   Caffeine- 3 daily     PHYSICAL EXAM  GENERAL EXAM/CONSTITUTIONAL: Vitals:  Vitals:   03/09/17 1115  BP: 134/84  Pulse: 77  Weight: 219 lb 9.6 oz (99.6 kg)  Height: '6\' 1"'$  (1.854 m)     Body mass index is 28.97 kg/m.  Visual Acuity Screening   Right eye Left eye Both eyes  Without correction: 20/30 20/30   With correction:        Patient is in no distress; well developed, nourished and groomed; neck is supple  CARDIOVASCULAR:  Examination of carotid arteries is normal; no carotid bruits  Regular rate and rhythm, no murmurs  Examination of peripheral vascular system by observation and palpation is normal  EYES:  Ophthalmoscopic exam of optic discs and posterior  segments is normal; no papilledema or hemorrhages  MUSCULOSKELETAL:  Gait, strength, tone, movements noted in Neurologic exam below  NEUROLOGIC: MENTAL STATUS:  No flowsheet data found.  awake, alert, oriented to person, place and time  recent and remote memory intact  normal attention and concentration  language fluent, comprehension intact, naming intact,   fund of knowledge appropriate  CRANIAL NERVE:   2nd - no papilledema on fundoscopic exam  2nd, 3rd, 4th, 6th - pupils equal and reactive to light, visual fields full to confrontation, extraocular muscles intact, no nystagmus  5th - facial sensation symmetric  7th - facial strength symmetric  8th - hearing intact  9th - palate elevates symmetrically, uvula midline  11th - shoulder shrug symmetric  12th - tongue protrusion midline  MOTOR:   POSTURAL AND ACTION TREMOR IN BUE  SLIGHT TREMOR WITH DRAWING SPIRALS  normal bulk and tone, full strength in the BUE EXCEPT LEFT WRIST FLEX (LIMITED BY PAIN)  BLE (HF 4+ LIMITED BY PAIN; OTHERWISE 5)  SENSORY:   normal and symmetric to light touch, temperature, vibration  COORDINATION:   finger-nose-finger, fine finger movements normal  REFLEXES:   deep tendon reflexes --> BUE TRACE; KNEES TRACE; ANKLES 0  INTERMITTENT UPGOING TOES WITH PLANTAR STIM  NEG HOFFMANS  GAIT/STATION:   narrow based gait; SLIGHTLY UNSTEADY, BUT able to walk on toes, heels and tandem; romberg is negative    DIAGNOSTIC DATA (LABS, IMAGING, TESTING) - I reviewed patient records, labs, notes, testing and imaging myself where available.  Lab Results  Component Value Date   WBC 6.6 06/29/2016   HGB 14.7 06/29/2016   HCT 42.2 06/29/2016   MCV 91.4 06/29/2016   PLT 254.0 06/29/2016      Component Value Date/Time   NA 142 06/29/2016 0936   K 4.5 06/29/2016 0936   CL 106  06/29/2016 0936   CO2 30 06/29/2016 0936   GLUCOSE 117 (H) 06/29/2016 0936   BUN 9 06/29/2016 0936    CREATININE 0.96 06/29/2016 0936   CALCIUM 9.4 06/29/2016 0936   PROT 6.4 06/29/2016 0936   ALBUMIN 4.3 06/29/2016 0936   AST 16 06/29/2016 0936   ALT 26 06/29/2016 0936   ALKPHOS 65 06/29/2016 0936   BILITOT 0.6 06/29/2016 0936   GFRNONAA >90 07/08/2013 0408   GFRAA >90 07/08/2013 0408   Lab Results  Component Value Date   CHOL 174 06/29/2016   HDL 55.50 06/29/2016   LDLCALC 89 06/29/2016   TRIG 148.0 06/29/2016   CHOLHDL 3 06/29/2016   Lab Results  Component Value Date   HGBA1C 5.6 01/15/2009   Lab Results  Component Value Date   UXLKGMWN02 725 03/30/2014   Lab Results  Component Value Date   TSH 1.84 06/29/2016     01/02/17 MRI cervical spine [I reviewed images myself and agree with interpretation. -VRP]  -At C5-6 mild to moderate central canal stenosis with moderate to severe bilateral foraminal narrowing -At C4-5 mild central canal stenosis with moderate to severe left and moderate right foraminal narrowing -At C6-7 moderate to severe left and mild right foraminal narrowing  01/02/17 MRI lumbar spine [I reviewed images myself and agree with interpretation. -VRP]  -At L4-5 moderate spinal canal stenosis with severe right and moderate left subarticular zone narrowing; moderate severe right and mild left foraminal stenosis -At L2-3 mild spinal canal stenosis with moderate right and mild left foraminal narrowing -At L3-4 mild spinal canal stenosis with mild to moderate right foraminal narrowing     ASSESSMENT AND PLAN  66 y.o. year old male here with 2 years of low back pain radiating to the legs, with neurogenic claudication, consistent with his lumbar degenerative spine disease and lumbar spinal stenosis.  Patient's gait difficulty is likely related to his lumbar spinal stenosis.  Patient also has some tremor in upper extremities and generalized hyperreflexia, which could be related to his anxiety disorder.  He does report intermittent whole body spasms, lasting 3  days at a time, going back 15 years.  Unclear etiology of the symptoms.  Will check MRI of the brain to rule out other central nervous system causes.   Dx:  1. Tremor   2. Spinal stenosis of lumbar region with neurogenic claudication      PLAN:  TREMORS - check MRI brain to eval tremor, intermittent whole body spasms  LOW BACK PAIN and GAIT DIFF - due to lumbar spinal stenosis; ok to proceed with surgery from neurology standpoint   Orders Placed This Encounter  Procedures  . MR BRAIN WO CONTRAST   Meds ordered this encounter  Medications  . ALPRAZolam (XANAX) 0.5 MG tablet    Sig: Take 1 tablet (0.5 mg total) by mouth as needed for anxiety (for sedation before MRI scan; take 1 hour before scan; may repeat 15 min before scan).    Dispense:  3 tablet    Refill:  0   Return in about 4 months (around 07/08/2017).    Penni Bombard, MD 36/64/4034, 74:25 AM Certified in Neurology, Neurophysiology and Neuroimaging  [facility] [address], [place], [place] [address] [phone]

## 2017-03-09 NOTE — Patient Instructions (Signed)
  TREMORS - check MRI brain to eval tremor, intermittent whole body spasms  LOW BACK PAIN and GAIT DIFF - due to lumbar spinal stenosis; ok to proceed with surgery from neurology standpoint

## 2017-03-18 ENCOUNTER — Ambulatory Visit
Admission: RE | Admit: 2017-03-18 | Discharge: 2017-03-18 | Disposition: A | Discharge status: Home / Self Care | Payer: Medicare Other | Source: Ambulatory Visit | Attending: Diagnostic Neuroimaging | Admitting: Diagnostic Neuroimaging

## 2017-03-18 DIAGNOSIS — G250 Essential tremor: Secondary | ICD-10-CM | POA: Diagnosis not present

## 2017-03-18 DIAGNOSIS — R251 Tremor, unspecified: Secondary | ICD-10-CM

## 2017-03-21 ENCOUNTER — Telehealth: Payer: Self-pay

## 2017-03-21 NOTE — Telephone Encounter (Signed)
-----   Message from Penni Bombard, MD sent at 03/21/2017  8:51 AM EST ----- Unremarkable imaging results. Please call patient. Continue current plan. -VRP

## 2017-03-23 NOTE — Telephone Encounter (Signed)
I called and spoke with patient and he is aware of results.

## 2017-03-26 DIAGNOSIS — G8929 Other chronic pain: Secondary | ICD-10-CM | POA: Diagnosis not present

## 2017-03-26 DIAGNOSIS — M5442 Lumbago with sciatica, left side: Secondary | ICD-10-CM | POA: Diagnosis not present

## 2017-03-26 DIAGNOSIS — M5441 Lumbago with sciatica, right side: Secondary | ICD-10-CM | POA: Diagnosis not present

## 2017-03-26 DIAGNOSIS — M4802 Spinal stenosis, cervical region: Secondary | ICD-10-CM | POA: Diagnosis not present

## 2017-04-06 ENCOUNTER — Other Ambulatory Visit (INDEPENDENT_AMBULATORY_CARE_PROVIDER_SITE_OTHER): Payer: Medicare Other

## 2017-04-06 ENCOUNTER — Encounter: Payer: Self-pay | Admitting: Internal Medicine

## 2017-04-06 ENCOUNTER — Ambulatory Visit (INDEPENDENT_AMBULATORY_CARE_PROVIDER_SITE_OTHER): Payer: Medicare Other | Admitting: Internal Medicine

## 2017-04-06 VITALS — BP 132/76 | HR 76 | Temp 98.2°F | Ht 73.0 in | Wt 227.0 lb

## 2017-04-06 DIAGNOSIS — E559 Vitamin D deficiency, unspecified: Secondary | ICD-10-CM

## 2017-04-06 DIAGNOSIS — R7309 Other abnormal glucose: Secondary | ICD-10-CM | POA: Diagnosis not present

## 2017-04-06 DIAGNOSIS — E538 Deficiency of other specified B group vitamins: Secondary | ICD-10-CM

## 2017-04-06 DIAGNOSIS — F32A Depression, unspecified: Secondary | ICD-10-CM | POA: Insufficient documentation

## 2017-04-06 DIAGNOSIS — F332 Major depressive disorder, recurrent severe without psychotic features: Secondary | ICD-10-CM

## 2017-04-06 DIAGNOSIS — F419 Anxiety disorder, unspecified: Secondary | ICD-10-CM

## 2017-04-06 DIAGNOSIS — F329 Major depressive disorder, single episode, unspecified: Secondary | ICD-10-CM | POA: Insufficient documentation

## 2017-04-06 DIAGNOSIS — F39 Unspecified mood [affective] disorder: Secondary | ICD-10-CM | POA: Insufficient documentation

## 2017-04-06 LAB — BASIC METABOLIC PANEL
BUN: 10 mg/dL (ref 6–23)
CALCIUM: 9 mg/dL (ref 8.4–10.5)
CHLORIDE: 101 meq/L (ref 96–112)
CO2: 28 mEq/L (ref 19–32)
CREATININE: 0.92 mg/dL (ref 0.40–1.50)
GFR: 87.48 mL/min (ref >60.00)
Glucose, Bld: 102 mg/dL — ABNORMAL HIGH (ref 70–99)
Potassium: 4 mEq/L (ref 3.5–5.1)
Sodium: 137 mEq/L (ref 135–145)

## 2017-04-06 LAB — HEMOGLOBIN A1C: HEMOGLOBIN A1C: 5.5 % (ref 4.6–6.5)

## 2017-04-06 LAB — SEDIMENTATION RATE: Sed Rate: 11 mm/hr (ref 0–20)

## 2017-04-06 MED ORDER — DEPLIN 15 15-90.314 MG PO CAPS: ORAL_CAPSULE | ORAL | 11 refills | Status: DC

## 2017-04-06 MED ORDER — TEMAZEPAM 30 MG PO CAPS: ORAL_CAPSULE | ORAL | 5 refills | Status: DC

## 2017-04-06 MED ORDER — FENTANYL 25 MCG/HR TD PT72: 25.0000 ug | MEDICATED_PATCH | TRANSDERMAL | 0 refills | Status: DC

## 2017-04-06 MED ORDER — OXYCODONE HCL 10 MG PO TABS: 5.0000 mg | ORAL_TABLET | Freq: Three times a day (TID) | ORAL | 0 refills | Status: DC | PRN

## 2017-04-06 NOTE — Assessment & Plan Note (Signed)
On B12 

## 2017-04-06 NOTE — Assessment & Plan Note (Addendum)
Added [facility] ref offered

## 2017-04-06 NOTE — Progress Notes (Signed)
Subjective:  Patient ID: Lee Owens, male    DOB: 01-29-1951  Age: 67 y.o. MRN: [id number]  CC: No chief complaint on file.   HPI Monique Gift presents for chronic pain, spasms - worse. C/o depression - worse. F/u w/B12 def  Outpatient Medications Prior to Visit  Medication Sig Dispense Refill  . ALPRAZolam (XANAX) 0.5 MG tablet Take 1 tablet (0.5 mg total) by mouth as needed for anxiety (for sedation before MRI scan; take 1 hour before scan; may repeat 15 min before scan). 3 tablet 0  . amoxicillin (AMOXIL) 500 MG tablet For dental exams  0  . buPROPion (WELLBUTRIN XL) 150 MG 24 hr tablet Take 2 tablets (300 mg total) by mouth daily. 180 tablet 1  . Cholecalciferol (VITAMIN D-3) 5000 UNITS TABS Take 5,000 Units by mouth daily.    . diazepam (VALIUM) 5 MG tablet Take 1 tablet (5 mg total) every 6 (six) hours as needed by mouth for anxiety. 120 tablet 3  . DULoxetine (CYMBALTA) 60 MG capsule Take 1 capsule (60 mg total) by mouth 2 (two) times daily. 180 capsule 3  . finasteride (PROSCAR) 5 MG tablet Take 5 mg by mouth daily.    Marland Kitchen lovastatin (MEVACOR) 40 MG tablet Take 1 tablet (40 mg total) by mouth at bedtime. 90 tablet 3  . magnesium gluconate (MAGONATE) 30 MG tablet Take 1 tablet (30 mg total) by mouth 2 (two) times daily. 60 tablet 5  . NARCAN 4 MG/0.1ML LIQD nasal spray kit as needed.  0  . tamsulosin (FLOMAX) 0.4 MG CAPS capsule Take 1 capsule by mouth daily.  2  . vitamin B-12 (CYANOCOBALAMIN) 1000 MCG tablet Take 2,000 mcg by mouth daily.     . fentaNYL (DURAGESIC) 25 MCG/HR patch Place 1 patch (25 mcg total) onto the skin every other day. Please fill on or after 03/05/17 15 patch 0  . Oxycodone HCl 10 MG TABS Take 0.5-1 tablets (5-10 mg total) by mouth 3 (three) times daily as needed. 90 each 0  . temazepam (RESTORIL) 30 MG capsule TAKE 1-2 CAPLETS BY MOUTH AT BEDTIME AS NEEDED FOR SLEEP. 30 capsule 5  . baclofen (LIORESAL) 20 MG tablet Take 1 tablet (20 mg total) by mouth 3  (three) times daily. (Patient not taking: Reported on 03/09/2017) 90 each 1   Facility-Administered Medications Prior to Visit  Medication Dose Route Frequency Provider Last Rate Last Dose  . 0.9 %  sodium chloride infusion  500 mL Intravenous Continuous Nandigam, Kavitha V, MD        ROS Review of Systems  Constitutional: Positive for fatigue. Negative for appetite change and unexpected weight change.  HENT: Negative for congestion, nosebleeds, sneezing, sore throat and trouble swallowing.   Eyes: Negative for itching and visual disturbance.  Respiratory: Negative for cough.   Cardiovascular: Negative for chest pain, palpitations and leg swelling.  Gastrointestinal: Negative for abdominal distention, blood in stool, diarrhea and nausea.  Genitourinary: Negative for frequency and hematuria.  Musculoskeletal: Positive for arthralgias, back pain, gait problem, myalgias, neck pain and neck stiffness. Negative for joint swelling.  Skin: Negative for rash.  Neurological: Negative for dizziness, tremors, speech difficulty and weakness.  Psychiatric/Behavioral: Positive for decreased concentration and dysphoric mood. Negative for agitation, sleep disturbance and suicidal ideas. The patient is nervous/anxious.     Objective:  BP 132/76 (BP Location: Left Arm, Patient Position: Sitting, Cuff Size: Large)   Pulse 76   Temp 98.2 F (36.8 C) (Oral)   Ht  6' 1"  (1.854 m)   Wt 227 lb (103 kg)   SpO2 98%   BMI 29.95 kg/m   BP Readings from Last 3 Encounters:  04/06/17 132/76  03/09/17 134/84  01/03/17 (!) 146/88    Wt Readings from Last 3 Encounters:  04/06/17 227 lb (103 kg)  03/09/17 219 lb 9.6 oz (99.6 kg)  01/03/17 225 lb (102.1 kg)    Physical Exam  Constitutional: He is oriented to person, place, and time. He appears well-developed. No distress.  NAD  HENT:  Mouth/Throat: Oropharynx is clear and moist.  Eyes: Conjunctivae are normal. Pupils are equal, round, and reactive to  light.  Neck: Normal range of motion. No JVD present. No thyromegaly present.  Cardiovascular: Normal rate, regular rhythm, normal heart sounds and intact distal pulses. Exam reveals no gallop and no friction rub.  No murmur heard. Pulmonary/Chest: Effort normal and breath sounds normal. No respiratory distress. He has no wheezes. He has no rales. He exhibits no tenderness.  Abdominal: Soft. Bowel sounds are normal. He exhibits no distension and no mass. There is no tenderness. There is no rebound and no guarding.  Musculoskeletal: Normal range of motion. He exhibits tenderness. He exhibits no edema.  Lymphadenopathy:    He has no cervical adenopathy.  Neurological: He is alert and oriented to person, place, and time. He has normal reflexes. No cranial nerve deficit. He exhibits normal muscle tone. He displays a negative Romberg sign. Coordination abnormal. Gait normal.  Skin: Skin is warm and dry. No rash noted.  Psychiatric: He has a normal mood and affect. His behavior is normal. Judgment and thought content normal.  LS tender  Lab Results  Component Value Date   WBC 6.6 06/29/2016   HGB 14.7 06/29/2016   HCT 42.2 06/29/2016   PLT 254.0 06/29/2016   GLUCOSE 117 (H) 06/29/2016   CHOL 174 06/29/2016   TRIG 148.0 06/29/2016   HDL 55.50 06/29/2016   LDLCALC 89 06/29/2016   ALT 26 06/29/2016   AST 16 06/29/2016   NA 142 06/29/2016   K 4.5 06/29/2016   CL 106 06/29/2016   CREATININE 0.96 06/29/2016   BUN 9 06/29/2016   CO2 30 06/29/2016   TSH 1.84 06/29/2016   PSA 0.76 06/29/2016   INR 0.96 06/30/2013   HGBA1C 5.6 01/15/2009    Mr Brain Wo Contrast  Result Date: 03/19/2017  [facility] [address] [place], [place] [address] [phone] NEUROIMAGING REPORT STUDY DATE: 03/18/2017 PATIENT NAME: Ramirez Fullbright DOB: [birth year] MRN: [id number] ORDERING CLINICIAN: Dr Leta Baptist CLINICAL HISTORY:  35 year patient with tremor COMPARISON FILMS:  none EXAM: MRI  Brain wo TECHNIQUE: MRI of the brain without contrast was obtained utilizing 5 mm axial slices with T1, T2, T2 flair, T2 star gradient echo and diffusion weighted views.  T1 sagittal and T2 coronal views were obtained. CONTRAST: none IMAGING SITE: [facility] FINDINGS: The brain parenchyma shows mild periventricular and subcortical nonspecific tiny punctate white matter hyperintensities likely due to changes of chronic microvascular ischemia. No structural lesion, tumor infarcts are noted. The paranasal sinuses show mild chronic erythematous changes with a small mucosal polyp/retention cyst in the left maxillary antrum.No abnormal lesions are seen on diffusion-weighted views to suggest acute ischemia. The cortical sulci, fissures and cisterns are normal in size and appearance. Lateral, third and fourth ventricle are normal in size and appearance. No extra-axial fluid collections are seen. No evidence of mass effect or midline shift.  On sagittal views  the posterior fossa, pituitary gland and corpus callosum are unremarkable. No evidence of intracranial hemorrhage on gradient-echo views. The orbits and their contents, paranasal sinuses and calvarium are unremarkable.  Intracranial flow voids are present except the right vertebral artery flow void appears to be of diminutive caliber.    Unremarkable MRI scan of the brain without contrast showing only minor changes of chronic microvascular ischemia and paranasal sinusitis. INTERPRETING PHYSICIAN: Antony Contras, MD Certified in  Neuroimaging by [employer] and [employer]    Assessment & Plan:   Diagnoses and all orders for this visit:  Elevated glucose -     Hemoglobin A1c; Future  Vitamin B12 deficiency -     Basic metabolic panel; Future -     Sedimentation rate; Future -     Hemoglobin A1c; Future  Vitamin D deficiency -     Basic metabolic panel; Future -     Sedimentation rate;  Future -     Hemoglobin A1c; Future  Severe episode of recurrent major depressive disorder, without psychotic features ([place]) -     Basic metabolic panel; Future -     Sedimentation rate; Future -     Hemoglobin A1c; Future  Other orders -     Discontinue: fentaNYL (DURAGESIC) 25 MCG/HR patch; Place 1 patch (25 mcg total) onto the skin every other day. Please fill on or after 04/05/17 -     Discontinue: Oxycodone HCl 10 MG TABS; Take 0.5-1 tablets (5-10 mg total) by mouth 3 (three) times daily as needed. -     temazepam (RESTORIL) 30 MG capsule; TAKE 1-2 CAPLETS BY MOUTH AT BEDTIME AS NEEDED FOR SLEEP. -     Discontinue: fentaNYL (DURAGESIC) 25 MCG/HR patch; Place 1 patch (25 mcg total) onto the skin every other day. Please fill on or after 05/06/17 -     Discontinue: Oxycodone HCl 10 MG TABS; Take 0.5-1 tablets (5-10 mg total) by mouth 3 (three) times daily as needed. -     fentaNYL (DURAGESIC) 25 MCG/HR patch; Place 1 patch (25 mcg total) onto the skin every other day. Please fill on or after 06/03/17 -     Oxycodone HCl 10 MG TABS; Take 0.5-1 tablets (5-10 mg total) by mouth 3 (three) times daily as needed. -     L-Methylfolate-Algae (DEPLIN 15) 15-90.314 MG CAPS; 1 po qd   I have discontinued Dalante Otani "Michael"'s baclofen, fentaNYL, and fentaNYL. I have also changed his fentaNYL. Additionally, I am having him start on DEPLIN 15. Lastly, I am having him maintain his vitamin B-12, finasteride, Vitamin D-3, tamsulosin, magnesium gluconate, DULoxetine, lovastatin, diazepam, buPROPion, NARCAN, amoxicillin, ALPRAZolam, temazepam, and Oxycodone HCl. We will continue to administer sodium chloride.  Meds ordered this encounter  Medications  . DISCONTD: fentaNYL (DURAGESIC) 25 MCG/HR patch    Sig: Place 1 patch (25 mcg total) onto the skin every other day. Please fill on or after 04/05/17    Dispense:  15 patch    Refill:  0  . DISCONTD: Oxycodone HCl 10 MG TABS    Sig: Take 0.5-1 tablets (5-10  mg total) by mouth 3 (three) times daily as needed.    Dispense:  90 each    Refill:  0    Please fill on or after 04/05/17  . temazepam (RESTORIL) 30 MG capsule    Sig: TAKE 1-2 CAPLETS BY MOUTH AT BEDTIME AS NEEDED FOR SLEEP.    Dispense:  30 capsule  Refill:  5  . DISCONTD: fentaNYL (DURAGESIC) 25 MCG/HR patch    Sig: Place 1 patch (25 mcg total) onto the skin every other day. Please fill on or after 05/06/17    Dispense:  15 patch    Refill:  0  . DISCONTD: Oxycodone HCl 10 MG TABS    Sig: Take 0.5-1 tablets (5-10 mg total) by mouth 3 (three) times daily as needed.    Dispense:  90 each    Refill:  0    Please fill on or after 05/06/17  . fentaNYL (DURAGESIC) 25 MCG/HR patch    Sig: Place 1 patch (25 mcg total) onto the skin every other day. Please fill on or after 06/03/17    Dispense:  15 patch    Refill:  0  . Oxycodone HCl 10 MG TABS    Sig: Take 0.5-1 tablets (5-10 mg total) by mouth 3 (three) times daily as needed.    Dispense:  90 each    Refill:  0    Please fill on or after 06/03/17  . L-Methylfolate-Algae (DEPLIN 15) 15-90.314 MG CAPS    Sig: 1 po qd    Dispense:  30 capsule    Refill:  11     Follow-up: Return in about 3 months (around 07/05/2017) for a follow-up visit.  Walker Kehr, MD

## 2017-04-06 NOTE — Patient Instructions (Addendum)
Wt Readings from Last 3 Encounters:  04/06/17 227 lb (103 kg)  03/09/17 219 lb 9.6 oz (99.6 kg)  01/03/17 225 lb (102.1 kg)   Try Trekking poles for walking

## 2017-04-06 NOTE — Assessment & Plan Note (Signed)
Diazepam prn  Potential benefits of a long term benzodiazepines  use as well as potential risks  and complications were explained to the patient and were aknowledged. 

## 2017-04-06 NOTE — Assessment & Plan Note (Signed)
On Vit D 

## 2017-04-22 ENCOUNTER — Encounter: Payer: Self-pay | Admitting: Internal Medicine

## 2017-04-24 ENCOUNTER — Other Ambulatory Visit: Payer: Self-pay | Admitting: Internal Medicine

## 2017-04-24 DIAGNOSIS — M25519 Pain in unspecified shoulder: Secondary | ICD-10-CM

## 2017-05-09 ENCOUNTER — Telehealth: Payer: Self-pay | Admitting: Internal Medicine

## 2017-05-09 NOTE — Telephone Encounter (Signed)
Copied from [place]. Topic: Quick Communication - See Telephone Encounter >> May 09, 2017  1:47 PM Ether Griffins B wrote: CRM for notification. See Telephone encounter for:  Pharmacy doesn't have the temazepam 30mg . They do have the 15mg . Can 60 be called in at 15 mgs? 05/09/17.

## 2017-05-10 MED ORDER — TEMAZEPAM 15 MG PO CAPS: 30.0000 mg | ORAL_CAPSULE | Freq: Every evening | ORAL | 0 refills | Status: DC | PRN

## 2017-05-10 NOTE — Telephone Encounter (Signed)
Ok Thx 

## 2017-05-10 NOTE — Telephone Encounter (Signed)
Called [facility] spoke w/pharmacist Amy. She verified that the temazepam 30 mg are on back order and they do not have a release date. Inform her MD has ok rx for 15 mg. Gave order verbally, also notified pt rx has been called to [facility].Marland KitchenJohny Owens

## 2017-05-12 NOTE — Progress Notes (Signed)
[facility] [place], [place] [address] Phone: [phone] Subjective:    I'm seeing this patient by the request  of:  Plotnikov, Evie Lacks, MD   CC: Shoulder pain  [id number]  Lee Owens is a 67 y.o. male coming in with complaint of shoulder pain.  Right-sided. Was turning over in bed when he felt the pain. Numbness in his hand. Can't raise arm. This morning he had radiating pain to his back and ribs. Feels popping. Neck pain and upper trap. Pain. Has deformity in his shoulder. Also has plantar fascia in left heel. Walks with a cane.   Onset- 1 month ago Location- Upper trap. Duration- All day Character- Sharp, dull Aggravating factors- flexion Reliving factors- Massage, oxycodone, heat Therapies tried-  Severity- 6 on pain scale   Patient did have an MRI of the cervical spine done showing a moderate to severe bilateral C5-C6 neural foraminal narrowing with moderate central stenosis as well as severe left and moderate right C4-C5 foraminal narrowing and mild to moderate spinal stenosis.  Independently visualized by me.  Past Medical History:  Diagnosis Date  . Allergic rhinitis   . Anxiety    takes Valium bid  . Arthritis    "hips, knees, hands" (07/07/2013)  . Back pain    occasionally but reason unknown  . BPH (benign prostatic hypertrophy)    takes Proscar daily  . Chronic fatigue   . Complication of anesthesia    Per pt, "Hard to sedate" past last colonoscopy in 2008!  . Depression    takes Cymbalta daily  . Diverticulosis   . Dizziness    due to dehydration  . Hemorrhoids   . Hyperlipidemia    takes Lovastatin daily  . Hypertension   . Insomnia    takes Restoril nightly   . Joint pain    knees/ankles/back pain  . Joint swelling   . Muscle pain    takes Baclofen daily  . Night muscle spasms    takes Valium as needed  . Pneumonia 1971  . Right knee DJD   . Urinary frequency    takes rapaflo daily  .  Vitamin B12 deficiency 2009  . Vitamin D deficiency disease    Past Surgical History:  Procedure Laterality Date  . COLONOSCOPY    . JOINT REPLACEMENT     both knees  . KNEE ARTHROSCOPY Bilateral 2012  . PROSTATE BIOPSY  01/2010  . 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 TOTAL KNEE ARTHROPLASTY Right 07/07/2013  . TOTAL KNEE ARTHROPLASTY Right 07/07/2013   Procedure: TOTAL KNEE ARTHROPLASTY- right;  Surgeon: Lorn Junes, MD;  Location: [facility];  Service: Orthopedics;  Laterality: Right;   Social History   Socioeconomic History  . Marital status: Married    Spouse name: ary  . Number of children: None  . Years of education: None  . Highest education level: None  Social Needs  . Financial resource strain: None  . Food insecurity - worry: None  . Food insecurity - inability: None  . Transportation needs - medical: None  . Transportation needs - non-medical: None  Occupational History  . Occupation: Primary school teacher: SELF-EMPLOYED  Tobacco Use  . Smoking status: Former Smoker    Packs/day: 1.00    Years: 38.00    Pack years: 38.00  Types: Cigarettes    Last attempt to quit: 10/23/2005    Years since quitting: 11.5  . Smokeless tobacco: Never Used  . Tobacco comment: quit smoking 2008  Substance and Sexual Activity  . Alcohol use: Yes    Alcohol/week: 8.4 oz    Types: 14 Cans of beer per week    Comment: 03/09/17 "2 beers daily"  . Drug use: No    Comment: quit age 68  . Sexual activity: Not Currently  Other Topics Concern  . None  Social History Narrative   Live with wife, MAry   Education: some college   Retired   No children   Caffeine- 3 daily   No Known Allergies Family History  Problem Relation Age of Onset  . Liver cancer Mother   . Heart failure Father   . Kidney disease Father   . Atrial  fibrillation Father   . Coronary artery disease Father   . Hypertension Other   . Coronary artery disease Other   . Dementia Sister      Past medical history, social, surgical and family history all reviewed in electronic medical record.  No pertanent information unless stated regarding to the chief complaint.   Review of Systems:Review of systems updated and as accurate as of 05/14/17  No headache, visual changes, nausea, vomiting, diarrhea, constipation, dizziness, abdominal pain, skin rash, fevers, chills, night sweats, weight loss, swollen lymph nodes, chest pain, shortness of breath, mood changes.  Positive body aches, muscle aches  Objective  Blood pressure (!) 142/90, pulse 91, height 6\' 1"  (1.854 m), weight 227 lb (103 kg), SpO2 98 %. Systems examined below as of 05/14/17   General: No apparent distress alert and oriented x3 mood and affect normal, dressed appropriately.  HEENT: Pupils equal, extraocular movements intact  Respiratory: Patient's speak in full sentences and does not appear short of breath  Cardiovascular: No lower extremity edema, non tender, no erythema  Skin: Warm dry intact with no signs of infection or rash on extremities or on axial skeleton.  Abdomen: Soft nontender  Neuro: Cranial nerves II through XII are intact, neurovascularly intact in all extremities with 2+ DTRs and 2+ pulses.  Lymph: No lymphadenopathy of posterior or anterior cervical chain or axillae bilaterally.  Gait normal with good balance and coordination.  MSK: Diffuse tender with near full range of motion and good stability and symmetric strength and tone of  elbows, wrist, hip, knee and ankles bilaterally.  Mild arthritic changes of multiple joints left knee replacement noted  Neck: Inspection mild loss of lordosis. No palpable stepoffs. Positive Spurling's maneuver on the right side.. Limited range of motion in all planes. Grip strength and sensation normal in bilateral hands Strength  good C4 to T1 distribution No sensory change to C4 to T1 Negative Hoffman sign bilaterally Reflexes normal  Shoulder: Right Inspection reveals no abnormalities, atrophy or asymmetry. Palpation is normal with no tenderness over AC joint or bicipital groove. ROM is full in all planes passively. Rotator cuff strength normal throughout. signs of impingement with positive Neer and Hawkin's tests, but negative empty can sign. Speeds and Yergason's tests normal. No labral pathology noted with negative Obrien's, negative clunk and good stability. Normal scapular function observed. No painful arc and no drop arm sign. No apprehension sign      Impression and Recommendations:     This case required medical decision making of moderate complexity.      Note: This dictation was prepared with Dragon dictation along  with smaller phrase technology. Any transcriptional errors that result from this process are unintentional.

## 2017-05-14 ENCOUNTER — Other Ambulatory Visit (INDEPENDENT_AMBULATORY_CARE_PROVIDER_SITE_OTHER): Payer: Medicare Other

## 2017-05-14 ENCOUNTER — Ambulatory Visit (INDEPENDENT_AMBULATORY_CARE_PROVIDER_SITE_OTHER)
Admission: RE | Admit: 2017-05-14 | Discharge: 2017-05-14 | Disposition: A | Discharge status: Home / Self Care | Payer: Medicare Other | Source: Ambulatory Visit | Attending: Family Medicine | Admitting: Family Medicine

## 2017-05-14 ENCOUNTER — Encounter: Payer: Self-pay | Admitting: Family Medicine

## 2017-05-14 ENCOUNTER — Ambulatory Visit: Payer: Self-pay

## 2017-05-14 ENCOUNTER — Ambulatory Visit (INDEPENDENT_AMBULATORY_CARE_PROVIDER_SITE_OTHER): Payer: Medicare Other | Admitting: Family Medicine

## 2017-05-14 VITALS — BP 142/90 | HR 91 | Ht 73.0 in | Wt 227.0 lb

## 2017-05-14 DIAGNOSIS — M2550 Pain in unspecified joint: Secondary | ICD-10-CM

## 2017-05-14 DIAGNOSIS — R059 Cough, unspecified: Secondary | ICD-10-CM

## 2017-05-14 DIAGNOSIS — M5412 Radiculopathy, cervical region: Secondary | ICD-10-CM | POA: Diagnosis not present

## 2017-05-14 DIAGNOSIS — G8929 Other chronic pain: Secondary | ICD-10-CM

## 2017-05-14 DIAGNOSIS — R05 Cough: Secondary | ICD-10-CM

## 2017-05-14 DIAGNOSIS — E559 Vitamin D deficiency, unspecified: Secondary | ICD-10-CM

## 2017-05-14 DIAGNOSIS — M25511 Pain in right shoulder: Principal | ICD-10-CM

## 2017-05-14 LAB — VITAMIN D 25 HYDROXY (VIT D DEFICIENCY, FRACTURES): VITD: 16.29 ng/mL — AB (ref 30.00–100.00)

## 2017-05-14 LAB — IBC PANEL
Iron: 102 ug/dL (ref 42–165)
Saturation Ratios: 26 % (ref 20.0–50.0)
TRANSFERRIN: 280 mg/dL (ref 212.0–360.0)

## 2017-05-14 LAB — URIC ACID: Uric Acid, Serum: 7.7 mg/dL (ref 4.0–7.8)

## 2017-05-14 LAB — SEDIMENTATION RATE: Sed Rate: 9 mm/hr (ref 0–20)

## 2017-05-14 MED ORDER — VITAMIN D (ERGOCALCIFEROL) 1.25 MG (50000 UNIT) PO CAPS: 50000.0000 [IU] | ORAL_CAPSULE | ORAL | 0 refills | Status: DC

## 2017-05-14 MED ORDER — GABAPENTIN 100 MG PO CAPS: 200.0000 mg | ORAL_CAPSULE | Freq: Every day | ORAL | 3 refills | Status: DC

## 2017-05-14 NOTE — Patient Instructions (Signed)
Good to see you  Labs and chest xray today  Ice 20 minutes 2 times daily. Usually after activity and before bed. Exercises 3 times a week.  Gabapentin 200mg  at night Try B12 nose spray  Once weekly vitamin D for 12 weeks  Keep hands within peripheral vision  See me again in 4 weeks

## 2017-05-14 NOTE — Assessment & Plan Note (Signed)
Patient is more of a cervical radiculopathy.  Discussed with patient in great length.  Do not believe that it is more his shoulder pain..  Seen on MRI.  Laboratory workup to see if there is anything else contributing to some of the aches and pains.  Patient has had difficulty with multiple medications and is already on chronic pain medications.  Discussed icing regimen, home exercises, which activities of doing which would to avoid.  Patient will follow-up with me in 4 weeks.

## 2017-05-17 LAB — CYCLIC CITRUL PEPTIDE ANTIBODY, IGG

## 2017-05-17 LAB — PTH, INTACT AND CALCIUM
Calcium: 9.4 mg/dL (ref 8.6–10.3)
PTH: 119 pg/mL — ABNORMAL HIGH (ref 14–64)

## 2017-05-17 LAB — RHEUMATOID FACTOR: Rhuematoid fact SerPl-aCnc: <14 IU/mL (ref <14)

## 2017-05-17 LAB — CALCIUM, IONIZED: Calcium, Ion: 5.2 mg/dL (ref 4.8–5.6)

## 2017-05-17 LAB — ANA: ANA: POSITIVE — AB

## 2017-05-17 LAB — ANGIOTENSIN CONVERTING ENZYME: ANGIOTENSIN-CONVERTING ENZYME: 50 U/L (ref 9–67)

## 2017-05-17 LAB — ANTI-NUCLEAR AB-TITER (ANA TITER): ANA Titer 1: 1:80 {titer} — ABNORMAL HIGH

## 2017-05-17 LAB — LACTATE DEHYDROGENASE: LDH: 150 U/L (ref 120–250)

## 2017-06-16 NOTE — Progress Notes (Signed)
[facility] [place], [place] [address] Phone: [phone] Subjective:     CC: Neck pain follow-up  [id number]  Lee Owens is a 67 y.o. male coming in with complaint of neck pain.  Was found to have elevated uric acid.  Also a low vitamin D level.  False positive ANA.  Was having signs and symptoms consistent with more of a cervical radiculopathy.  Started on low-dose gabapentin at home exercises. Limited imaging already done. Patient states that he has not been doing well until today. Patient has been having pain throughout his entire back. He develops neck pain while sleeping and today woke up with lumbar spine pain.  History of chronic pain and is on chronic narcotics including a fentanyl patch    Past Medical History:  Diagnosis Date  . Allergic rhinitis   . Anxiety    takes Valium bid  . Arthritis    "hips, knees, hands" (07/07/2013)  . Back pain    occasionally but reason unknown  . BPH (benign prostatic hypertrophy)    takes Proscar daily  . Chronic fatigue   . Complication of anesthesia    Per pt, "Hard to sedate" past last colonoscopy in 2008!  . Depression    takes Cymbalta daily  . Diverticulosis   . Dizziness    due to dehydration  . Hemorrhoids   . Hyperlipidemia    takes Lovastatin daily  . Hypertension   . Insomnia    takes Restoril nightly   . Joint pain    knees/ankles/back pain  . Joint swelling   . Muscle pain    takes Baclofen daily  . Night muscle spasms    takes Valium as needed  . Pneumonia 1971  . Right knee DJD   . Urinary frequency    takes rapaflo daily  . Vitamin B12 deficiency 2009  . Vitamin D deficiency disease    Past Surgical History:  Procedure Laterality Date  . COLONOSCOPY    . JOINT REPLACEMENT     both knees  . KNEE ARTHROSCOPY Bilateral 2012  . PROSTATE BIOPSY  01/2010  . 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 TOTAL KNEE ARTHROPLASTY Right 07/07/2013  . TOTAL KNEE ARTHROPLASTY Right 07/07/2013   Procedure: TOTAL KNEE ARTHROPLASTY- right;  Surgeon: Lorn Junes, MD;  Location: [facility];  Service: Orthopedics;  Laterality: Right;   Social History   Socioeconomic History  . Marital status: Married    Spouse name: ary  . Number of children: Not on file  . Years of education: Not on file  . Highest education level: Not on file  Occupational History  . Occupation: Primary school teacher: SELF-EMPLOYED  Social Needs  . Financial resource strain: Not on file  . Food insecurity:    Worry: Not on file    Inability: Not on file  . Transportation needs:    Medical: Not on file    Non-medical: Not on file  Tobacco Use  . Smoking status: Former Smoker    Packs/day: 1.00    Years: 38.00    Pack years: 38.00    Types: Cigarettes    Last attempt to quit: 10/23/2005    Years since quitting: 11.6  . Smokeless tobacco: Never Used  . Tobacco comment: quit smoking 2008  Substance and Sexual  Activity  . Alcohol use: Yes    Alcohol/week: 8.4 oz    Types: 14 Cans of beer per week    Comment: 03/09/17 "2 beers daily"  . Drug use: No    Comment: quit age 32  . Sexual activity: Not Currently  Lifestyle  . Physical activity:    Days per week: Not on file    Minutes per session: Not on file  . Stress: Not on file  Relationships  . Social connections:    Talks on phone: Not on file    Gets together: Not on file    Attends religious service: Not on file    Active member of club or organization: Not on file    Attends meetings of clubs or organizations: Not on file    Relationship status: Not on file  Other Topics Concern  . Not on file  Social History Narrative   Live with wife, MAry   Education: some college   Retired   No children   Caffeine- 3 daily   No Known Allergies Family History  Problem  Relation Age of Onset  . Liver cancer Mother   . Heart failure Father   . Kidney disease Father   . Atrial fibrillation Father   . Coronary artery disease Father   . Hypertension Other   . Coronary artery disease Other   . Dementia Sister      Past medical history, social, surgical and family history all reviewed in electronic medical record.  No pertanent information unless stated regarding to the chief complaint.   Review of Systems:Review of systems updated and as accurate as of 06/18/17  No , visual changes, nausea, vomiting, diarrhea, constipation, dizziness, abdominal pain, skin rash, fevers, chills, night sweats, weight loss, swollen lymph nodes,chest pain, shortness of breath, mood changes.  Positive muscle aches, joint swelling, body aches, headaches  Objective  Blood pressure 106/68, pulse 74, height 6\' 1"  (1.854 m), weight 227 lb (103 kg), SpO2 94 %. Systems examined below as of 06/18/17   General: No apparent distress alert and oriented x3 mood and affect normal, dressed appropriately.  HEENT: Pupils equal, extraocular movements intact  Respiratory: Patient's speak in full sentences and does not appear short of breath  Cardiovascular: No lower extremity edema, non tender, no erythema  Skin: Warm dry intact with no signs of infection or rash on extremities or on axial skeleton.  Abdomen: Soft nontender  Neuro: Cranial nerves II through XII are intact, neurovascularly intact in all extremities with 2+ DTRs and 2+ pulses.  Lymph: No lymphadenopathy of posterior or anterior cervical chain or axillae bilaterally.  Gait  MSK:  tender with limited range of motion and good stability and symmetric strength and tone of shoulders, elbows, wrist, hip, knee and ankles bilaterally.  Patient has arthritic changes of all joints. Neck exam shows the patient does have significant decreased range of motion in all planes.  Positive Spurling's extends.  Pain to palpation seems to be out of  proportion to the amount of stress.   Impression and Recommendations:     This case required medical decision making of moderate complexity.      Note: This dictation was prepared with Dragon dictation along with smaller phrase technology. Any transcriptional errors that result from this process are unintentional.

## 2017-06-18 ENCOUNTER — Ambulatory Visit (INDEPENDENT_AMBULATORY_CARE_PROVIDER_SITE_OTHER): Payer: Medicare Other | Admitting: Family Medicine

## 2017-06-18 ENCOUNTER — Encounter: Payer: Self-pay | Admitting: Family Medicine

## 2017-06-18 DIAGNOSIS — M109 Gout, unspecified: Secondary | ICD-10-CM | POA: Diagnosis not present

## 2017-06-18 DIAGNOSIS — E559 Vitamin D deficiency, unspecified: Secondary | ICD-10-CM

## 2017-06-18 MED ORDER — ALLOPURINOL 300 MG PO TABS: 300.0000 mg | ORAL_TABLET | Freq: Every day | ORAL | 6 refills | Status: DC

## 2017-06-18 NOTE — Patient Instructions (Signed)
Good to see you  It looks like gout could be playing a role pennsaid pinkie amount topically 2 times daily as needed.  Try the medicine I am giving you 2 times a day for next 3 days Allopurinol 300mg  daily  For the gout possibility  Keep trying to be active Continue the vitamin D See me again in 4-6 weeks

## 2017-06-18 NOTE — Assessment & Plan Note (Signed)
Once weekly vitamin D given for supplementation.

## 2017-06-18 NOTE — Assessment & Plan Note (Signed)
Started allopurinol.  Given colchicine to bridge.  Encourage patient take this at length and can make some improvement.  Follow-up 4 weeks

## 2017-06-25 ENCOUNTER — Telehealth: Payer: Self-pay

## 2017-06-25 NOTE — Telephone Encounter (Signed)
Spoke with patient. He lost his medication and wanted another sample but he found the medication and does not need additional samples.

## 2017-07-03 ENCOUNTER — Encounter: Payer: Self-pay | Admitting: Internal Medicine

## 2017-07-03 ENCOUNTER — Ambulatory Visit (INDEPENDENT_AMBULATORY_CARE_PROVIDER_SITE_OTHER): Payer: Medicare Other | Admitting: Internal Medicine

## 2017-07-03 DIAGNOSIS — G8929 Other chronic pain: Secondary | ICD-10-CM | POA: Diagnosis not present

## 2017-07-03 DIAGNOSIS — E559 Vitamin D deficiency, unspecified: Secondary | ICD-10-CM | POA: Diagnosis not present

## 2017-07-03 DIAGNOSIS — F419 Anxiety disorder, unspecified: Secondary | ICD-10-CM

## 2017-07-03 DIAGNOSIS — M5441 Lumbago with sciatica, right side: Secondary | ICD-10-CM

## 2017-07-03 DIAGNOSIS — I10 Essential (primary) hypertension: Secondary | ICD-10-CM

## 2017-07-03 DIAGNOSIS — M5442 Lumbago with sciatica, left side: Secondary | ICD-10-CM

## 2017-07-03 DIAGNOSIS — F332 Major depressive disorder, recurrent severe without psychotic features: Secondary | ICD-10-CM

## 2017-07-03 DIAGNOSIS — E785 Hyperlipidemia, unspecified: Secondary | ICD-10-CM | POA: Diagnosis not present

## 2017-07-03 DIAGNOSIS — M791 Myalgia, unspecified site: Secondary | ICD-10-CM

## 2017-07-03 DIAGNOSIS — E538 Deficiency of other specified B group vitamins: Secondary | ICD-10-CM

## 2017-07-03 MED ORDER — METHYLPREDNISOLONE 4 MG PO TBPK: ORAL_TABLET | ORAL | 0 refills | Status: DC

## 2017-07-03 MED ORDER — FENTANYL 25 MCG/HR TD PT72: 25.0000 ug | MEDICATED_PATCH | TRANSDERMAL | 0 refills | Status: DC

## 2017-07-03 MED ORDER — OXYCODONE HCL 10 MG PO TABS: 5.0000 mg | ORAL_TABLET | Freq: Three times a day (TID) | ORAL | 0 refills | Status: DC | PRN

## 2017-07-03 MED ORDER — OXYCODONE HCL 10 MG PO TABS
5.0000 mg | ORAL_TABLET | Freq: Three times a day (TID) | ORAL | 0 refills | Status: DC | PRN
Start: 2017-07-03 — End: 2017-10-01

## 2017-07-03 NOTE — Assessment & Plan Note (Signed)
On B12 

## 2017-07-03 NOTE — Assessment & Plan Note (Signed)
Vit D 

## 2017-07-03 NOTE — Assessment & Plan Note (Signed)
Cymbalta po

## 2017-07-03 NOTE — Assessment & Plan Note (Signed)
Duragesic, Oxy IR  Potential benefits of a long term opioids use as well as potential risks (i.e. addiction risk, apnea etc) and complications (i.e. Somnolence, constipation and others) were explained to the patient and were aknowledged. 7/15 Duragesic would last x 2 d only  Diazepam prn spasms  Potential benefits of a long term benzodiazepines  use as well as potential risks  and complications were explained to the patient and were aknowledged.

## 2017-07-03 NOTE — Assessment & Plan Note (Signed)
Diazepam prn  Potential benefits of a long term benzodiazepines  use as well as potential risks  and complications were explained to the patient and were aknowledged. 

## 2017-07-03 NOTE — Progress Notes (Signed)
Subjective:  Patient ID: Lee Owens, male    DOB: 1950-12-12  Age: 67 y.o. MRN: [id number]  CC: No chief complaint on file.   HPI Boe Deans presents for chronic pain, depression, OA, depression. Not feeling well...  Outpatient Medications Prior to Visit  Medication Sig Dispense Refill  . allopurinol (ZYLOPRIM) 300 MG tablet Take 1 tablet (300 mg total) by mouth daily. 30 tablet 6  . amoxicillin (AMOXIL) 500 MG tablet For dental exams  0  . buPROPion (WELLBUTRIN XL) 150 MG 24 hr tablet Take 2 tablets (300 mg total) by mouth daily. 180 tablet 1  . Cholecalciferol (VITAMIN D-3) 5000 UNITS TABS Take 5,000 Units by mouth daily.    . diazepam (VALIUM) 5 MG tablet Take 1 tablet (5 mg total) every 6 (six) hours as needed by mouth for anxiety. 120 tablet 3  . DULoxetine (CYMBALTA) 60 MG capsule Take 1 capsule (60 mg total) by mouth 2 (two) times daily. 180 capsule 3  . fentaNYL (DURAGESIC) 25 MCG/HR patch Place 1 patch (25 mcg total) onto the skin every other day. Please fill on or after 06/03/17 15 patch 0  . finasteride (PROSCAR) 5 MG tablet Take 5 mg by mouth daily.    Marland Kitchen gabapentin (NEURONTIN) 100 MG capsule Take 2 capsules (200 mg total) by mouth at bedtime. 60 capsule 3  . L-Methylfolate-Algae (DEPLIN 15) 15-90.314 MG CAPS 1 po qd 30 capsule 11  . lovastatin (MEVACOR) 40 MG tablet Take 1 tablet (40 mg total) by mouth at bedtime. 90 tablet 3  . magnesium gluconate (MAGONATE) 30 MG tablet Take 1 tablet (30 mg total) by mouth 2 (two) times daily. 60 tablet 5  . NARCAN 4 MG/0.1ML LIQD nasal spray kit as needed.  0  . Oxycodone HCl 10 MG TABS Take 0.5-1 tablets (5-10 mg total) by mouth 3 (three) times daily as needed. 90 each 0  . tamsulosin (FLOMAX) 0.4 MG CAPS capsule Take 1 capsule by mouth daily.  2  . temazepam (RESTORIL) 30 MG capsule TAKE 1-2 CAPLETS BY MOUTH AT BEDTIME AS NEEDED FOR SLEEP. 30 capsule 5  . vitamin B-12 (CYANOCOBALAMIN) 1000 MCG tablet Take 2,000 mcg by mouth daily.      . Vitamin D, Ergocalciferol, (DRISDOL) 50000 units CAPS capsule Take 1 capsule (50,000 Units total) by mouth every 7 (seven) days. 12 capsule 0  . temazepam (RESTORIL) 15 MG capsule Take 2 capsules (30 mg total) by mouth at bedtime as needed for sleep. 60 capsule 0   Facility-Administered Medications Prior to Visit  Medication Dose Route Frequency Provider Last Rate Last Dose  . 0.9 %  sodium chloride infusion  500 mL Intravenous Continuous Nandigam, Kavitha V, MD        ROS Review of Systems  Constitutional: Positive for fatigue. Negative for appetite change and unexpected weight change.  HENT: Negative for congestion, nosebleeds, sneezing, sore throat and trouble swallowing.   Eyes: Negative for itching and visual disturbance.  Respiratory: Negative for cough.   Cardiovascular: Negative for chest pain, palpitations and leg swelling.  Gastrointestinal: Negative for abdominal distention, blood in stool, diarrhea and nausea.  Genitourinary: Negative for frequency and hematuria.  Musculoskeletal: Positive for arthralgias, gait problem, myalgias, neck pain and neck stiffness. Negative for back pain and joint swelling.  Skin: Negative for rash.  Neurological: Negative for dizziness, tremors, speech difficulty and weakness.  Psychiatric/Behavioral: Positive for dysphoric mood. Negative for agitation, sleep disturbance and suicidal ideas. The patient is nervous/anxious.  Objective:  BP 134/88 (BP Location: Left Arm, Patient Position: Sitting, Cuff Size: Large)   Pulse 76   Temp 98.3 F (36.8 C) (Oral)   Ht 6' 1"  (1.854 m)   Wt 228 lb (103.4 kg)   SpO2 96%   BMI 30.08 kg/m   BP Readings from Last 3 Encounters:  07/03/17 134/88  06/18/17 106/68  05/14/17 (!) 142/90    Wt Readings from Last 3 Encounters:  07/03/17 228 lb (103.4 kg)  06/18/17 227 lb (103 kg)  05/14/17 227 lb (103 kg)    Physical Exam  Constitutional: He is oriented to person, place, and time. He appears  well-developed. No distress.  NAD  HENT:  Mouth/Throat: Oropharynx is clear and moist.  Eyes: Pupils are equal, round, and reactive to light. Conjunctivae are normal.  Neck: Normal range of motion. No JVD present. No thyromegaly present.  Cardiovascular: Normal rate, regular rhythm, normal heart sounds and intact distal pulses. Exam reveals no gallop and no friction rub.  No murmur heard. Pulmonary/Chest: Effort normal and breath sounds normal. No respiratory distress. He has no wheezes. He has no rales. He exhibits no tenderness.  Abdominal: Soft. Bowel sounds are normal. He exhibits no distension and no mass. There is no tenderness. There is no rebound and no guarding.  Musculoskeletal: Normal range of motion. He exhibits tenderness. He exhibits no edema.  LS, neck painful w/ROM  Lymphadenopathy:    He has no cervical adenopathy.  Neurological: He is alert and oriented to person, place, and time. He displays abnormal reflex. No cranial nerve deficit. He exhibits abnormal muscle tone. He displays a negative Romberg sign. Coordination abnormal. Gait normal.  Skin: Skin is warm and dry. No rash noted.  Psychiatric: He has a normal mood and affect. His behavior is normal. Judgment and thought content normal.    Lab Results  Component Value Date   WBC 6.6 06/29/2016   HGB 14.7 06/29/2016   HCT 42.2 06/29/2016   PLT 254.0 06/29/2016   GLUCOSE 102 (H) 04/06/2017   CHOL 174 06/29/2016   TRIG 148.0 06/29/2016   HDL 55.50 06/29/2016   LDLCALC 89 06/29/2016   ALT 26 06/29/2016   AST 16 06/29/2016   NA 137 04/06/2017   K 4.0 04/06/2017   CL 101 04/06/2017   CREATININE 0.92 04/06/2017   BUN 10 04/06/2017   CO2 28 04/06/2017   TSH 1.84 06/29/2016   PSA 0.76 06/29/2016   INR 0.96 06/30/2013   HGBA1C 5.5 04/06/2017    Dg Chest 2 View  Result Date: 05/15/2017 CLINICAL DATA:  Intermittent cough, congestion for a month, former smoking history EXAM: CHEST  2 VIEW COMPARISON:  CT chest of  07/24/2016 and chest x-ray of 05/05/2013 FINDINGS: No active infiltrate or effusion is seen. Mediastinal and hilar contours are unremarkable. The heart is within normal limits in size. There are degenerative changes throughout the thoracic spine. IMPRESSION: No active cardiopulmonary disease. Electronically Signed   By: Ivar Drape M.D.   On: 05/15/2017 08:10   [place] Limited Joint Space Structures Up Right  Result Date: 05/20/2017 No images saved    Assessment & Plan:   There are no diagnoses linked to this encounter. I am having Lee Owens "[name]" maintain his vitamin B-12, finasteride, Vitamin D-3, tamsulosin, magnesium gluconate, DULoxetine, lovastatin, diazepam, buPROPion, NARCAN, amoxicillin, temazepam, fentaNYL, Oxycodone HCl, DEPLIN 15, gabapentin, Vitamin D (Ergocalciferol), and allopurinol. We will continue to administer sodium chloride.  No orders of the defined types were placed  in this encounter.    Follow-up: No follow-ups on file.  Walker Kehr, MD

## 2017-07-03 NOTE — Assessment & Plan Note (Signed)
Losartan 

## 2017-07-03 NOTE — Assessment & Plan Note (Signed)
  Water exercises, dry needling, PT

## 2017-07-19 ENCOUNTER — Telehealth: Payer: Self-pay

## 2017-07-19 MED ORDER — VIAGRA 100 MG PO TABS: 50.0000 mg | ORAL_TABLET | Freq: Every day | ORAL | 5 refills | Status: DC | PRN

## 2017-07-19 NOTE — Telephone Encounter (Signed)
Ok Thx 

## 2017-07-19 NOTE — Telephone Encounter (Signed)
Rec'd fax from [facility], pt requesting brand name Viagra 100mg  tabs. Not in med list. Please advise

## 2017-07-19 NOTE — Telephone Encounter (Signed)
Updated med l;ist [facility] for viagra to [facility].Marland KitchenJohny Chess

## 2017-07-25 ENCOUNTER — Ambulatory Visit (INDEPENDENT_AMBULATORY_CARE_PROVIDER_SITE_OTHER): Payer: Medicare Other | Admitting: Family Medicine

## 2017-07-25 ENCOUNTER — Encounter: Payer: Self-pay | Admitting: Family Medicine

## 2017-07-25 DIAGNOSIS — M109 Gout, unspecified: Secondary | ICD-10-CM

## 2017-07-25 DIAGNOSIS — M5412 Radiculopathy, cervical region: Secondary | ICD-10-CM

## 2017-07-25 NOTE — Progress Notes (Signed)
[facility] [address], [place] [address] Phone: [phone] Subjective:    I'm seeing this patient by the request  of:    CC: Neck and shoulder pain follow-up  [id number]  Lee Owens is a 67 y.o. male coming in with complaint of neck pain.  Patient was found to have more of a cervical radiculopathy.  Patient has responded very well to gabapentin, home exercise, posture and ergonomics.  Patient states he is feeling 75% better.  Still some mild discomfort usually at the end of the day but nothing severe anymore.  Feels like he has made progress.       Past Medical History:  Diagnosis Date  . Allergic rhinitis   . Anxiety    takes Valium bid  . Arthritis    "hips, knees, hands" (07/07/2013)  . Back pain    occasionally but reason unknown  . BPH (benign prostatic hypertrophy)    takes Proscar daily  . Chronic fatigue   . Complication of anesthesia    Per pt, "Hard to sedate" past last colonoscopy in 2008!  . Depression    takes Cymbalta daily  . Diverticulosis   . Dizziness    due to dehydration  . Hemorrhoids   . Hyperlipidemia    takes Lovastatin daily  . Hypertension   . Insomnia    takes Restoril nightly   . Joint pain    knees/ankles/back pain  . Joint swelling   . Muscle pain    takes Baclofen daily  . Night muscle spasms    takes Valium as needed  . Pneumonia 1971  . Right knee DJD   . Urinary frequency    takes rapaflo daily  . Vitamin B12 deficiency 2009  . Vitamin D deficiency disease    Past Surgical History:  Procedure Laterality Date  . COLONOSCOPY    . JOINT REPLACEMENT     both knees  . KNEE ARTHROSCOPY Bilateral 2012  . PROSTATE BIOPSY  01/2010  . 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 TOTAL KNEE ARTHROPLASTY Right 07/07/2013  . TOTAL KNEE ARTHROPLASTY Right  07/07/2013   Procedure: TOTAL KNEE ARTHROPLASTY- right;  Surgeon: Lorn Junes, MD;  Location: [facility];  Service: Orthopedics;  Laterality: Right;   Social History   Socioeconomic History  . Marital status: Married    Spouse name: ary  . Number of children: Not on file  . Years of education: Not on file  . Highest education level: Not on file  Occupational History  . Occupation: Primary school teacher: SELF-EMPLOYED  Social Needs  . Financial resource strain: Not on file  . Food insecurity:    Worry: Not on file    Inability: Not on file  . Transportation needs:    Medical: Not on file    Non-medical: Not on file  Tobacco Use  . Smoking status: Former Smoker    Packs/day: 1.00    Years: 38.00    Pack years: 38.00    Types: Cigarettes    Last attempt to quit: 10/23/2005    Years since quitting: 11.7  . Smokeless tobacco: Never Used  . Tobacco comment: quit smoking 2008  Substance and Sexual Activity  . Alcohol use: Yes    Alcohol/week: 8.4 oz    Types: 14 Cans of beer per  week    Comment: 03/09/17 "2 beers daily"  . Drug use: No    Comment: quit age 79  . Sexual activity: Not Currently  Lifestyle  . Physical activity:    Days per week: Not on file    Minutes per session: Not on file  . Stress: Not on file  Relationships  . Social connections:    Talks on phone: Not on file    Gets together: Not on file    Attends religious service: Not on file    Active member of club or organization: Not on file    Attends meetings of clubs or organizations: Not on file    Relationship status: Not on file  Other Topics Concern  . Not on file  Social History Narrative   Live with wife, MAry   Education: some college   Retired   No children   Caffeine- 3 daily   No Known Allergies Family History  Problem Relation Age of Onset  . Liver cancer Mother   . Heart failure Father   . Kidney disease Father   . Atrial fibrillation Father   . Coronary artery disease  Father   . Hypertension Other   . Coronary artery disease Other   . Dementia Sister      Past medical history, social, surgical and family history all reviewed in electronic medical record.  No pertanent information unless stated regarding to the chief complaint.   Review of Systems:Review of systems updated and as accurate as of 07/25/17  No headache, visual changes, nausea, vomiting, diarrhea, constipation, dizziness, abdominal pain, skin rash, fevers, chills, night sweats, weight loss, swollen lymph nodes, body aches, joint swelling, chest pain, shortness of breath, mood changes.  Positive muscle aches  Objective  Blood pressure 124/80, pulse 82, height 6\' 1"  (1.854 m), weight 228 lb (103.4 kg), SpO2 92 %. Systems examined below as of 07/25/17   General: No apparent distress alert and oriented x3 mood and affect normal, dressed appropriately.  HEENT: Pupils equal, extraocular movements intact  Respiratory: Patient's speak in full sentences and does not appear short of breath  Cardiovascular: No lower extremity edema, non tender, no erythema  Skin: Warm dry intact with no signs of infection or rash on extremities or on axial skeleton.  Abdomen: Soft nontender  Neuro: Cranial nerves II through XII are intact, neurovascularly intact in all extremities with 2+ DTRs and 2+ pulses.  Lymph: No lymphadenopathy of posterior or anterior cervical chain or axillae bilaterally.  Gait normal with good balance and coordination.  MSK:  Non tender with full range of motion and good stability and symmetric strength and tone of shoulders, elbows, wrist, hip, knee and ankles bilaterally.  Mild arthritic changes of multiple joints Neck exam shows loss of lordosis.  Mild crepitus with range of motion.  Negative Spurling's noted today.  Strength symmetric deep tendon reflexes bilaterally.    Impression and Recommendations:     This case required medical decision making of moderate complexity.       Note: This dictation was prepared with Dragon dictation along with smaller phrase technology. Any transcriptional errors that result from this process are unintentional.

## 2017-07-25 NOTE — Patient Instructions (Signed)
Good to see you  You are doing amazing  Lets keep the allopurinol indefinitely  Vitamin D just do the daily but worsening pain for many days in a row call me and I will refill the once weekly  Stay active See me again when you need me

## 2017-07-26 NOTE — Assessment & Plan Note (Signed)
Lab Results  Component Value Date   LABURIC 7.7 05/14/2017   Started on allopurinol is doing better.  Continue all other medications.  Follow-up again as needed

## 2017-07-26 NOTE — Assessment & Plan Note (Signed)
Improved.  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  Discussed with patient about icing regimen, home exercise, which activities of doing which wants to avoid.  Patient is to increase activity as tolerated.  Patient will follow-up with me as needed.

## 2017-08-01 ENCOUNTER — Ambulatory Visit (INDEPENDENT_AMBULATORY_CARE_PROVIDER_SITE_OTHER): Payer: Medicare Other | Admitting: Diagnostic Neuroimaging

## 2017-08-01 ENCOUNTER — Encounter: Payer: Self-pay | Admitting: Diagnostic Neuroimaging

## 2017-08-01 VITALS — BP 109/81 | HR 92 | Ht 73.0 in | Wt 227.0 lb

## 2017-08-01 DIAGNOSIS — M48062 Spinal stenosis, lumbar region with neurogenic claudication: Secondary | ICD-10-CM

## 2017-08-01 DIAGNOSIS — R251 Tremor, unspecified: Secondary | ICD-10-CM

## 2017-08-01 NOTE — Progress Notes (Signed)
[facility]  PATIENT: Lee Owens DOB: 07-08-1950  REFERRING CLINICIAN: Trenton Gammon, MD HISTORY FROM: patient and wife REASON FOR VISIT: follow up   HISTORICAL  CHIEF COMPLAINT:  Chief Complaint  Patient presents with  . Follow-up    Patient reports no change in his tremors, but his balance is not doing well.     HISTORY OF PRESENT ILLNESS:   UPDATE (08/01/17, VRP): Since last visit, doing about the same. Pain continues. Spasms and tremors are reduced. No alleviating or aggravating factors. Now dx'd with gout. Also with right shoulder and left foot pain.   PRIOR HPI (03/09/17): 67 year old male here for evaluation of gait and balance difficulty.  Patient reports at least 15 years of intermittent whole body muscle pain and muscle spasm.  He has been on chronic pain management for many years.  For past 2 years patient has had low back pain, worsening with standing and walking.  Symptoms worsening over time.  If he stands and walks he may develop burning, pain and weakness in his thighs and legs.  Patient has had MRI of lumbar spine showing moderate spinal stenosis at L4-5 level, with additional multilevel degenerative changes.  Patient has been evaluated by orthopedic surgery.  However patient was noted to have gait ataxia, hyperreflexia, and upper extremity symptoms concerning for cervical myelopathy.  However MRI of the cervical spine showed no significant cervical myelopathy changes.  Therefore patient referred here to neurology clinic for further evaluation of other upper motor neuron etiologies.  Patient also reports intermittent tremors in his hands since he was in college.  These fluctuate depending on fatigue, stress, anxiety.  Patient does have some deterioration in handwriting and last few years.  No change in sense of smell or taste.  He has some constipation, related to opioid use.  He has some punching and kicking in his sleep.  He has family history of  dementia and his sister.    REVIEW OF SYSTEMS: Full 14 system review of systems performed and negative with exception of: diff urinating tremors decr appetite / activity insomnia neck pain joint pain.    ALLERGIES: No Known Allergies  HOME MEDICATIONS: Outpatient Medications Prior to Visit  Medication Sig Dispense Refill  . allopurinol (ZYLOPRIM) 300 MG tablet Take 1 tablet (300 mg total) by mouth daily. 30 tablet 6  . amoxicillin (AMOXIL) 500 MG tablet For dental exams  0  . buPROPion (WELLBUTRIN XL) 150 MG 24 hr tablet Take 2 tablets (300 mg total) by mouth daily. 180 tablet 1  . Cholecalciferol (VITAMIN D-3) 5000 UNITS TABS Take 5,000 Units by mouth daily.    . diazepam (VALIUM) 5 MG tablet Take 1 tablet (5 mg total) every 6 (six) hours as needed by mouth for anxiety. 120 tablet 3  . DULoxetine (CYMBALTA) 60 MG capsule Take 1 capsule (60 mg total) by mouth 2 (two) times daily. 180 capsule 3  . fentaNYL (DURAGESIC) 25 MCG/HR patch Place 1 patch (25 mcg total) onto the skin every other day. Please fill on or after 09/03/17 15 patch 0  . finasteride (PROSCAR) 5 MG tablet Take 5 mg by mouth daily.    Marland Kitchen gabapentin (NEURONTIN) 100 MG capsule Take 2 capsules (200 mg total) by mouth at bedtime. 60 capsule 3  . L-Methylfolate-Algae (DEPLIN 15) 15-90.314 MG CAPS 1 po qd 30 capsule 11  . lovastatin (MEVACOR) 40 MG tablet Take 1 tablet (40 mg total) by mouth at bedtime. 90 tablet 3  . magnesium  gluconate (MAGONATE) 30 MG tablet Take 1 tablet (30 mg total) by mouth 2 (two) times daily. 60 tablet 5  . NARCAN 4 MG/0.1ML LIQD nasal spray kit as needed.  0  . Oxycodone HCl 10 MG TABS Take 0.5-1 tablets (5-10 mg total) by mouth 3 (three) times daily as needed. 90 each 0  . tamsulosin (FLOMAX) 0.4 MG CAPS capsule Take 1 capsule by mouth daily.  2  . temazepam (RESTORIL) 30 MG capsule TAKE 1-2 CAPLETS BY MOUTH AT BEDTIME AS NEEDED FOR SLEEP. 30 capsule 5  . VIAGRA 100 MG tablet Take 0.5-1 tablets (50-100  mg total) by mouth daily as needed for erectile dysfunction. 5 tablet 5  . vitamin B-12 (CYANOCOBALAMIN) 1000 MCG tablet Take 2,000 mcg by mouth daily.     . Vitamin D, Ergocalciferol, (DRISDOL) 50000 units CAPS capsule Take 1 capsule (50,000 Units total) by mouth every 7 (seven) days. 12 capsule 0  . methylPREDNISolone (MEDROL DOSEPAK) 4 MG TBPK tablet As directed 21 tablet 0   Facility-Administered Medications Prior to Visit  Medication Dose Route Frequency Provider Last Rate Last Dose  . 0.9 %  sodium chloride infusion  500 mL Intravenous Continuous Nandigam, Venia Minks, MD        PAST MEDICAL HISTORY: Past Medical History:  Diagnosis Date  . Allergic rhinitis   . Anxiety    takes Valium bid  . Arthritis    "hips, knees, hands" (07/07/2013)  . Back pain    occasionally but reason unknown  . BPH (benign prostatic hypertrophy)    takes Proscar daily  . Chronic fatigue   . Complication of anesthesia    Per pt, "Hard to sedate" past last colonoscopy in 2008!  . Depression    takes Cymbalta daily  . Diverticulosis   . Dizziness    due to dehydration  . Hemorrhoids   . Hyperlipidemia    takes Lovastatin daily  . Hypertension   . Insomnia    takes Restoril nightly   . Joint pain    knees/ankles/back pain  . Joint swelling   . Muscle pain    takes Baclofen daily  . Night muscle spasms    takes Valium as needed  . Pneumonia 1971  . Right knee DJD   . Urinary frequency    takes rapaflo daily  . Vitamin B12 deficiency 2009  . Vitamin D deficiency disease     PAST SURGICAL HISTORY: Past Surgical History:  Procedure Laterality Date  . COLONOSCOPY    . JOINT REPLACEMENT     both knees  . KNEE ARTHROSCOPY Bilateral 2012  . PROSTATE BIOPSY  01/2010  . 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   Procedure: TOTAL KNEE ARTHROPLASTY- left;  Surgeon: Lorn Junes, MD;  Location: [facility];  Service: Orthopedics;  Laterality: Left;  . TOTAL KNEE ARTHROPLASTY Right 07/07/2013  .  TOTAL KNEE ARTHROPLASTY Right 07/07/2013   Procedure: TOTAL KNEE ARTHROPLASTY- right;  Surgeon: Lorn Junes, MD;  Location: [facility];  Service: Orthopedics;  Laterality: Right;    FAMILY HISTORY: Family History  Problem Relation Age of Onset  . Liver cancer Mother   . Heart failure Father   . Kidney disease Father   . Atrial fibrillation Father   . Coronary artery disease Father   . Hypertension Other   . Coronary artery disease Other   . Dementia Sister     SOCIAL HISTORY:  Social History   Socioeconomic History  . Marital status: Married  Spouse name: ary  . Number of children: Not on file  . Years of education: Not on file  . Highest education level: Not on file  Occupational History  . Occupation: Primary school teacher: SELF-EMPLOYED  Social Needs  . Financial resource strain: Not on file  . Food insecurity:    Worry: Not on file    Inability: Not on file  . Transportation needs:    Medical: Not on file    Non-medical: Not on file  Tobacco Use  . Smoking status: Former Smoker    Packs/day: 1.00    Years: 38.00    Pack years: 38.00    Types: Cigarettes    Last attempt to quit: 10/23/2005    Years since quitting: 11.7  . Smokeless tobacco: Never Used  . Tobacco comment: quit smoking 2008  Substance and Sexual Activity  . Alcohol use: Yes    Alcohol/week: 8.4 oz    Types: 14 Cans of beer per week    Comment: 03/09/17 "2 beers daily"  . Drug use: No    Comment: quit age 78  . Sexual activity: Not Currently  Lifestyle  . Physical activity:    Days per week: Not on file    Minutes per session: Not on file  . Stress: Not on file  Relationships  . Social connections:    Talks on phone: Not on file    Gets together: Not on file    Attends religious service: Not on file    Active member of club or organization: Not on file    Attends meetings of clubs or organizations: Not on file    Relationship status: Not on file  . Intimate partner  violence:    Fear of current or ex partner: Not on file    Emotionally abused: Not on file    Physically abused: Not on file    Forced sexual activity: Not on file  Other Topics Concern  . Not on file  Social History Narrative   Live with wife, MAry   Education: some college   Retired   No children   Caffeine- 3 daily     PHYSICAL EXAM  GENERAL EXAM/CONSTITUTIONAL: Vitals:  Vitals:   08/01/17 1343  BP: 109/81  Pulse: 92  Weight: 227 lb (103 kg)  Height: _0  (1.854 m)   Body mass index is 29.95 kg/m. No exam data present  Patient is in no distress; well developed, nourished and groomed; neck is supple  CARDIOVASCULAR:  Examination of carotid arteries is normal; no carotid bruits  Regular rate and rhythm, no murmurs  Examination of peripheral vascular system by observation and palpation is normal  EYES:  Ophthalmoscopic exam of optic discs and posterior segments is normal; no papilledema or hemorrhages  MUSCULOSKELETAL:  Gait, strength, tone, movements noted in Neurologic exam below  NEUROLOGIC: MENTAL STATUS:  No flowsheet data found.  awake, alert, oriented to person, place and time  recent and remote memory intact  normal attention and concentration  language fluent, comprehension intact, naming intact,   fund of knowledge appropriate  CRANIAL NERVE:   2nd - no papilledema on fundoscopic exam  2nd, 3rd, 4th, 6th - pupils equal and reactive to light, visual fields full to confrontation, extraocular muscles intact, no nystagmus  5th - facial sensation symmetric  7th - facial strength symmetric  8th - hearing intact  9th - palate elevates symmetrically, uvula midline  11th - shoulder shrug symmetric  12th -  tongue protrusion midline  MOTOR:   MILD POSTURAL AND ACTION TREMOR IN BUE  normal bulk and tone, full strength in the BUE AND BLE  SENSORY:   normal and symmetric to light touch, temperature, vibration  COORDINATION:    finger-nose-finger, fine finger movements normal  REFLEXES:   deep tendon reflexes --> BUE TRACE; KNEES TRACE; ANKLES 0  GAIT/STATION:   narrow based gait; SLIGHTLY UNSTEADY    DIAGNOSTIC DATA (LABS, IMAGING, TESTING) - I reviewed patient records, labs, notes, testing and imaging myself where available.  Lab Results  Component Value Date   WBC 6.6 06/29/2016   HGB 14.7 06/29/2016   HCT 42.2 06/29/2016   MCV 91.4 06/29/2016   PLT 254.0 06/29/2016      Component Value Date/Time   NA 137 04/06/2017 1421   K 4.0 04/06/2017 1421   CL 101 04/06/2017 1421   CO2 28 04/06/2017 1421   GLUCOSE 102 (H) 04/06/2017 1421   BUN 10 04/06/2017 1421   CREATININE 0.92 04/06/2017 1421   CALCIUM 9.4 05/14/2017 1613   PROT 6.4 06/29/2016 0936   ALBUMIN 4.3 06/29/2016 0936   AST 16 06/29/2016 0936   ALT 26 06/29/2016 0936   ALKPHOS 65 06/29/2016 0936   BILITOT 0.6 06/29/2016 0936   GFRNONAA >90 07/08/2013 0408   GFRAA >90 07/08/2013 0408   Lab Results  Component Value Date   CHOL 174 06/29/2016   HDL 55.50 06/29/2016   LDLCALC 89 06/29/2016   TRIG 148.0 06/29/2016   CHOLHDL 3 06/29/2016   Lab Results  Component Value Date   HGBA1C 5.5 04/06/2017   Lab Results  Component Value Date   KKXFGHWE99 371 03/30/2014   Lab Results  Component Value Date   TSH 1.84 06/29/2016     01/02/17 MRI cervical spine [I reviewed images myself and agree with interpretation. -VRP]  -At C5-6 mild to moderate central canal stenosis with moderate to severe bilateral foraminal narrowing -At C4-5 mild central canal stenosis with moderate to severe left and moderate right foraminal narrowing -At C6-7 moderate to severe left and mild right foraminal narrowing  01/02/17 MRI lumbar spine [I reviewed images myself and agree with interpretation. -VRP]  -At L4-5 moderate spinal canal stenosis with severe right and moderate left subarticular zone narrowing; moderate severe right and mild left foraminal  stenosis -At L2-3 mild spinal canal stenosis with moderate right and mild left foraminal narrowing -At L3-4 mild spinal canal stenosis with mild to moderate right foraminal narrowing  03/18/17 MRI brain - Unremarkable MRI scan of the brain without contrast showing only minor changes of chronic microvascular ischemia and paranasal sinusitis.     ASSESSMENT AND PLAN  67 y.o. year old male here with 2 years of low back pain radiating to the legs, with neurogenic claudication, consistent with his lumbar degenerative spine disease and lumbar spinal stenosis.  Patient's gait difficulty is likely related to his lumbar spinal stenosis.  Patient also has some tremor in upper extremities and generalized hyperreflexia, which could be related to his anxiety disorder and cervical spinal stenosis.   Dx:  1. Tremor   2. Spinal stenosis of lumbar region with neurogenic claudication      PLAN:  TREMORS (due to anxiety and cervical spinal stenosis and cervical radiculopathies) - stable; monitor  LOW BACK PAIN and GAIT DIFF - due to lumbar spinal stenosis; follow up with orthopedic clinic  Return if symptoms worsen or fail to improve, for return to PCP and orthopedic clinic.  Penni Bombard, MD 7/0/4888, 9:16 PM Certified in Neurology, Neurophysiology and Neuroimaging  [facility] [address] [place], [place] [address] [phone]

## 2017-08-10 ENCOUNTER — Other Ambulatory Visit: Payer: Self-pay | Admitting: Family Medicine

## 2017-08-13 ENCOUNTER — Other Ambulatory Visit: Payer: Self-pay | Admitting: Internal Medicine

## 2017-08-27 DIAGNOSIS — G894 Chronic pain syndrome: Secondary | ICD-10-CM | POA: Diagnosis not present

## 2017-08-30 ENCOUNTER — Other Ambulatory Visit: Payer: Self-pay

## 2017-09-03 DIAGNOSIS — G894 Chronic pain syndrome: Secondary | ICD-10-CM | POA: Diagnosis not present

## 2017-09-03 MED ORDER — DULOXETINE HCL 60 MG PO CPEP: 60.0000 mg | ORAL_CAPSULE | Freq: Two times a day (BID) | ORAL | 3 refills | Status: DC

## 2017-09-03 MED ORDER — BUPROPION HCL ER (XL) 150 MG PO TB24: 300.0000 mg | ORAL_TABLET | Freq: Every day | ORAL | 1 refills | Status: DC

## 2017-09-09 ENCOUNTER — Other Ambulatory Visit: Payer: Self-pay | Admitting: Family Medicine

## 2017-09-10 DIAGNOSIS — G894 Chronic pain syndrome: Secondary | ICD-10-CM | POA: Diagnosis not present

## 2017-09-17 DIAGNOSIS — G894 Chronic pain syndrome: Secondary | ICD-10-CM | POA: Diagnosis not present

## 2017-10-01 ENCOUNTER — Ambulatory Visit (INDEPENDENT_AMBULATORY_CARE_PROVIDER_SITE_OTHER): Payer: Medicare Other | Admitting: Internal Medicine

## 2017-10-01 ENCOUNTER — Ambulatory Visit (INDEPENDENT_AMBULATORY_CARE_PROVIDER_SITE_OTHER)
Admission: RE | Admit: 2017-10-01 | Discharge: 2017-10-01 | Disposition: A | Discharge status: Home / Self Care | Payer: Medicare Other | Source: Ambulatory Visit | Attending: Internal Medicine | Admitting: Internal Medicine

## 2017-10-01 ENCOUNTER — Encounter: Payer: Self-pay | Admitting: Internal Medicine

## 2017-10-01 DIAGNOSIS — E538 Deficiency of other specified B group vitamins: Secondary | ICD-10-CM

## 2017-10-01 DIAGNOSIS — E559 Vitamin D deficiency, unspecified: Secondary | ICD-10-CM

## 2017-10-01 DIAGNOSIS — M791 Myalgia, unspecified site: Secondary | ICD-10-CM

## 2017-10-01 DIAGNOSIS — I10 Essential (primary) hypertension: Secondary | ICD-10-CM

## 2017-10-01 DIAGNOSIS — M79672 Pain in left foot: Secondary | ICD-10-CM | POA: Diagnosis not present

## 2017-10-01 DIAGNOSIS — M19072 Primary osteoarthritis, left ankle and foot: Secondary | ICD-10-CM | POA: Diagnosis not present

## 2017-10-01 MED ORDER — FENTANYL 25 MCG/HR TD PT72: 25.0000 ug | MEDICATED_PATCH | TRANSDERMAL | 0 refills | Status: DC

## 2017-10-01 MED ORDER — OXYCODONE HCL 10 MG PO TABS: 5.0000 mg | ORAL_TABLET | Freq: Three times a day (TID) | ORAL | 0 refills | Status: DC | PRN

## 2017-10-01 NOTE — Progress Notes (Signed)
Subjective:  Patient ID: Lee Owens, male    DOB: Aug 25, 1950  Age: 67 y.o. MRN: [id number]  CC: No chief complaint on file.   HPI Lee Owens presents for chronic pain, spasms, depression f/u. Steroids helped last time  C/o L foot swelling and discoloration x 1 week. No injury  Outpatient Medications Prior to Visit  Medication Sig Dispense Refill  . allopurinol (ZYLOPRIM) 300 MG tablet Take 1 tablet (300 mg total) by mouth daily. 30 tablet 6  . amoxicillin (AMOXIL) 500 MG tablet For dental exams  0  . buPROPion (WELLBUTRIN XL) 150 MG 24 hr tablet Take 2 tablets (300 mg total) by mouth daily. 180 tablet 1  . Cholecalciferol (VITAMIN D-3) 5000 UNITS TABS Take 5,000 Units by mouth daily.    . diazepam (VALIUM) 5 MG tablet TAKE 1 TABLET BY MOUTH EVERY 6 HOURS AS NEEDED FOR ANXIETY 120 tablet 1  . DULoxetine (CYMBALTA) 60 MG capsule Take 1 capsule (60 mg total) by mouth 2 (two) times daily. 180 capsule 3  . fentaNYL (DURAGESIC) 25 MCG/HR patch Place 1 patch (25 mcg total) onto the skin every other day. Please fill on or after 09/03/17 15 patch 0  . finasteride (PROSCAR) 5 MG tablet Take 5 mg by mouth daily.    Marland Kitchen gabapentin (NEURONTIN) 100 MG capsule TAKE 2 CAPSULES(200 MG) BY MOUTH AT BEDTIME 60 capsule 0  . L-Methylfolate-Algae (DEPLIN 15) 15-90.314 MG CAPS 1 po qd 30 capsule 11  . lovastatin (MEVACOR) 40 MG tablet Take 1 tablet (40 mg total) by mouth at bedtime. 90 tablet 3  . magnesium gluconate (MAGONATE) 30 MG tablet Take 1 tablet (30 mg total) by mouth 2 (two) times daily. 60 tablet 5  . NARCAN 4 MG/0.1ML LIQD nasal spray kit as needed.  0  . Oxycodone HCl 10 MG TABS Take 0.5-1 tablets (5-10 mg total) by mouth 3 (three) times daily as needed. 90 each 0  . tamsulosin (FLOMAX) 0.4 MG CAPS capsule Take 1 capsule by mouth daily.  2  . temazepam (RESTORIL) 30 MG capsule TAKE 1-2 CAPLETS BY MOUTH AT BEDTIME AS NEEDED FOR SLEEP. 30 capsule 5  . VIAGRA 100 MG tablet Take 0.5-1 tablets  (50-100 mg total) by mouth daily as needed for erectile dysfunction. 5 tablet 5  . vitamin B-12 (CYANOCOBALAMIN) 1000 MCG tablet Take 2,000 mcg by mouth daily.     . Vitamin D, Ergocalciferol, (DRISDOL) 50000 units CAPS capsule TAKE 1 CAPSULE BY MOUTH EVERY 7 DAYS 12 capsule 0   Facility-Administered Medications Prior to Visit  Medication Dose Route Frequency Provider Last Rate Last Dose  . 0.9 %  sodium chloride infusion  500 mL Intravenous Continuous Nandigam, Kavitha V, MD        ROS: Review of Systems  Constitutional: Positive for fatigue. Negative for appetite change and unexpected weight change.  HENT: Negative for congestion, nosebleeds, sneezing, sore throat and trouble swallowing.   Eyes: Negative for itching and visual disturbance.  Respiratory: Negative for cough.   Cardiovascular: Negative for chest pain, palpitations and leg swelling.  Gastrointestinal: Negative for abdominal distention, blood in stool, diarrhea and nausea.  Genitourinary: Negative for frequency and hematuria.  Musculoskeletal: Positive for arthralgias, back pain, gait problem, joint swelling, myalgias, neck pain and neck stiffness.  Skin: Negative for rash.  Neurological: Positive for weakness. Negative for dizziness, tremors and speech difficulty.  Psychiatric/Behavioral: Positive for dysphoric mood and sleep disturbance. Negative for agitation and suicidal ideas. The patient is nervous/anxious.  Objective:  BP (!) 142/92 (BP Location: Left Arm, Patient Position: Sitting, Cuff Size: Normal)   Pulse 85   Temp 98.2 F (36.8 C) (Oral)   Ht _0  (1.854 m)   Wt 228 lb (103.4 kg)   SpO2 97%   BMI 30.08 kg/m   BP Readings from Last 3 Encounters:  10/01/17 (!) 142/92  08/01/17 109/81  07/25/17 124/80    Wt Readings from Last 3 Encounters:  10/01/17 228 lb (103.4 kg)  08/01/17 227 lb (103 kg)  07/25/17 228 lb (103.4 kg)    Physical Exam  Constitutional: He is oriented to person, place, and  time. He appears well-developed. No distress.  NAD  HENT:  Mouth/Throat: Oropharynx is clear and moist.  Eyes: Pupils are equal, round, and reactive to light. Conjunctivae are normal.  Neck: Normal range of motion. No JVD present. No thyromegaly present.  Cardiovascular: Normal rate, regular rhythm, normal heart sounds and intact distal pulses. Exam reveals no gallop and no friction rub.  No murmur heard. Pulmonary/Chest: Effort normal and breath sounds normal. No respiratory distress. He has no wheezes. He has no rales. He exhibits no tenderness.  Abdominal: Soft. Bowel sounds are normal. He exhibits no distension and no mass. There is no tenderness. There is no rebound and no guarding.  Musculoskeletal: Normal range of motion. He exhibits edema and tenderness.  Lymphadenopathy:    He has no cervical adenopathy.  Neurological: He is alert and oriented to person, place, and time. He has normal reflexes. No cranial nerve deficit. He exhibits normal muscle tone. He displays a negative Romberg sign. Coordination abnormal. Gait normal.  Skin: Skin is warm and dry. No rash noted.  Psychiatric: He has a normal mood and affect. His behavior is normal. Judgment and thought content normal.  L foot is purple and swollen, tender at the ball LS tender Limping   Lab Results  Component Value Date   WBC 6.6 06/29/2016   HGB 14.7 06/29/2016   HCT 42.2 06/29/2016   PLT 254.0 06/29/2016   GLUCOSE 102 (H) 04/06/2017   CHOL 174 06/29/2016   TRIG 148.0 06/29/2016   HDL 55.50 06/29/2016   LDLCALC 89 06/29/2016   ALT 26 06/29/2016   AST 16 06/29/2016   NA 137 04/06/2017   K 4.0 04/06/2017   CL 101 04/06/2017   CREATININE 0.92 04/06/2017   BUN 10 04/06/2017   CO2 28 04/06/2017   TSH 1.84 06/29/2016   PSA 0.76 06/29/2016   INR 0.96 06/30/2013   HGBA1C 5.5 04/06/2017    Dg Chest 2 View  Result Date: 05/15/2017 CLINICAL DATA:  Intermittent cough, congestion for a month, former smoking history  EXAM: CHEST  2 VIEW COMPARISON:  CT chest of 07/24/2016 and chest x-ray of 05/05/2013 FINDINGS: No active infiltrate or effusion is seen. Mediastinal and hilar contours are unremarkable. The heart is within normal limits in size. There are degenerative changes throughout the thoracic spine. IMPRESSION: No active cardiopulmonary disease. Electronically Signed   By: Ivar Drape M.D.   On: 05/15/2017 08:10   [place] Limited Joint Space Structures Up Right  Result Date: 05/20/2017 No images saved    Assessment & Plan:   There are no diagnoses linked to this encounter.   No orders of the defined types were placed in this encounter.    Follow-up: No follow-ups on file.  Walker Kehr, MD

## 2017-10-01 NOTE — Assessment & Plan Note (Signed)
BP Readings from Last 3 Encounters:  10/01/17 (!) 142/92  08/01/17 109/81  07/25/17 124/80

## 2017-10-01 NOTE — Assessment & Plan Note (Signed)
Refractory - weekly Vit d 50000 iu 

## 2017-10-01 NOTE — Patient Instructions (Signed)
Try GLYTONE exfoliating body lotion by Pierre Fabre (free acid value 17.5)  

## 2017-10-01 NOTE — Assessment & Plan Note (Signed)
  Duragesic, Oxy IR  Potential benefits of a long term opioids use as well as potential risks (i.e. addiction risk, apnea etc) and complications (i.e. Somnolence, constipation and others) were explained to the patient and were aknowledged.   Diazepam prn spasms  Potential benefits of a long term benzodiazepines  use as well as potential risks  and complications were explained to the patient and were aknowledged.

## 2017-10-01 NOTE — Assessment & Plan Note (Signed)
On B12 

## 2017-10-01 NOTE — Assessment & Plan Note (Signed)
Gout vs fx X ray

## 2017-10-05 ENCOUNTER — Other Ambulatory Visit: Payer: Self-pay | Admitting: Family Medicine

## 2017-10-12 ENCOUNTER — Other Ambulatory Visit: Payer: Self-pay | Admitting: Internal Medicine

## 2017-10-12 DIAGNOSIS — G894 Chronic pain syndrome: Secondary | ICD-10-CM | POA: Diagnosis not present

## 2017-10-18 DIAGNOSIS — G894 Chronic pain syndrome: Secondary | ICD-10-CM | POA: Diagnosis not present

## 2017-10-30 ENCOUNTER — Other Ambulatory Visit: Payer: Self-pay | Admitting: Family Medicine

## 2017-10-31 DIAGNOSIS — G894 Chronic pain syndrome: Secondary | ICD-10-CM | POA: Diagnosis not present

## 2017-11-01 ENCOUNTER — Other Ambulatory Visit: Payer: Self-pay

## 2017-11-01 NOTE — Telephone Encounter (Signed)
Control database checked last refill: 09/26/2017 LOV: 10/01/2017 NOV: 12/31/2017

## 2017-11-05 DIAGNOSIS — N401 Enlarged prostate with lower urinary tract symptoms: Secondary | ICD-10-CM | POA: Diagnosis not present

## 2017-11-05 DIAGNOSIS — N5201 Erectile dysfunction due to arterial insufficiency: Secondary | ICD-10-CM | POA: Diagnosis not present

## 2017-11-05 DIAGNOSIS — R972 Elevated prostate specific antigen [PSA]: Secondary | ICD-10-CM | POA: Diagnosis not present

## 2017-11-05 DIAGNOSIS — R3911 Hesitancy of micturition: Secondary | ICD-10-CM | POA: Diagnosis not present

## 2017-11-05 MED ORDER — DIAZEPAM 5 MG PO TABS: 5.0000 mg | ORAL_TABLET | Freq: Four times a day (QID) | ORAL | 1 refills | Status: DC | PRN

## 2017-11-08 DIAGNOSIS — G894 Chronic pain syndrome: Secondary | ICD-10-CM | POA: Diagnosis not present

## 2017-11-12 ENCOUNTER — Other Ambulatory Visit: Payer: Self-pay | Admitting: Family Medicine

## 2017-12-12 ENCOUNTER — Other Ambulatory Visit: Payer: Self-pay | Admitting: Family Medicine

## 2017-12-12 NOTE — Telephone Encounter (Signed)
Refill done.  

## 2017-12-31 ENCOUNTER — Ambulatory Visit (INDEPENDENT_AMBULATORY_CARE_PROVIDER_SITE_OTHER): Payer: Medicare Other | Admitting: Internal Medicine

## 2017-12-31 ENCOUNTER — Encounter: Payer: Self-pay | Admitting: Internal Medicine

## 2017-12-31 VITALS — BP 132/84 | HR 77 | Temp 97.7°F | Ht 73.0 in | Wt 234.0 lb

## 2017-12-31 DIAGNOSIS — Z23 Encounter for immunization: Secondary | ICD-10-CM

## 2017-12-31 DIAGNOSIS — I10 Essential (primary) hypertension: Secondary | ICD-10-CM

## 2017-12-31 DIAGNOSIS — M5441 Lumbago with sciatica, right side: Secondary | ICD-10-CM

## 2017-12-31 DIAGNOSIS — G8929 Other chronic pain: Secondary | ICD-10-CM

## 2017-12-31 DIAGNOSIS — E669 Obesity, unspecified: Secondary | ICD-10-CM | POA: Diagnosis not present

## 2017-12-31 DIAGNOSIS — M791 Myalgia, unspecified site: Secondary | ICD-10-CM | POA: Diagnosis not present

## 2017-12-31 DIAGNOSIS — M5442 Lumbago with sciatica, left side: Secondary | ICD-10-CM

## 2017-12-31 MED ORDER — OXYCODONE HCL 10 MG PO TABS: 5.0000 mg | ORAL_TABLET | Freq: Three times a day (TID) | ORAL | 0 refills | Status: DC | PRN

## 2017-12-31 MED ORDER — FENTANYL 25 MCG/HR TD PT72: 25.0000 ug | MEDICATED_PATCH | TRANSDERMAL | 0 refills | Status: DC

## 2017-12-31 MED ORDER — ALLOPURINOL 300 MG PO TABS: 300.0000 mg | ORAL_TABLET | Freq: Every day | ORAL | 11 refills | Status: DC

## 2017-12-31 MED ORDER — DIAZEPAM 10 MG PO TABS: 10.0000 mg | ORAL_TABLET | Freq: Two times a day (BID) | ORAL | 2 refills | Status: DC | PRN

## 2017-12-31 MED ORDER — VITAMIN D (ERGOCALCIFEROL) 1.25 MG (50000 UNIT) PO CAPS: ORAL_CAPSULE | ORAL | 0 refills | Status: DC

## 2017-12-31 NOTE — Assessment & Plan Note (Signed)
Duragesic, Oxy IR  Potential benefits of a long term opioids use as well as potential risks (i.e. addiction risk, apnea etc) and complications (i.e. Somnolence, constipation and others) were explained to the patient and were aknowledged.

## 2017-12-31 NOTE — Assessment & Plan Note (Signed)
Losartan 

## 2017-12-31 NOTE — Assessment & Plan Note (Signed)
BMI 30.1 Wt [facility] appt adviced

## 2017-12-31 NOTE — Progress Notes (Signed)
Subjective:  Patient ID: Lee Owens, male    DOB: [birth year]  Age: 67 y.o. MRN: [id number]  CC: No chief complaint on file.   HPI Lee Owens presents for chronic pain, spasms He fell twice  Outpatient Medications Prior to Visit  Medication Sig Dispense Refill  . allopurinol (ZYLOPRIM) 300 MG tablet Take 1 tablet (300 mg total) by mouth daily. 30 tablet 6  . amoxicillin (AMOXIL) 500 MG tablet For dental exams  0  . buPROPion (WELLBUTRIN XL) 150 MG 24 hr tablet Take 2 tablets (300 mg total) by mouth daily. 180 tablet 1  . Cholecalciferol (VITAMIN D-3) 5000 UNITS TABS Take 5,000 Units by mouth daily.    . diazepam (VALIUM) 5 MG tablet Take 1 tablet (5 mg total) by mouth every 6 (six) hours as needed. for anxiety 120 tablet 1  . DULoxetine (CYMBALTA) 60 MG capsule Take 1 capsule (60 mg total) by mouth 2 (two) times daily. 180 capsule 3  . fentaNYL (DURAGESIC) 25 MCG/HR patch Place 1 patch (25 mcg total) onto the skin every other day. Please fill on or after 12/04/17 15 patch 0  . finasteride (PROSCAR) 5 MG tablet Take 5 mg by mouth daily.    Marland Kitchen gabapentin (NEURONTIN) 100 MG capsule TAKE 2 CAPSULES(200 MG) BY MOUTH AT BEDTIME 180 capsule 1  . L-Methylfolate-Algae (DEPLIN 15) 15-90.314 MG CAPS 1 po qd 30 capsule 11  . lovastatin (MEVACOR) 40 MG tablet Take 1 tablet (40 mg total) by mouth at bedtime. 90 tablet 3  . magnesium gluconate (MAGONATE) 30 MG tablet Take 1 tablet (30 mg total) by mouth 2 (two) times daily. 60 tablet 5  . NARCAN 4 MG/0.1ML LIQD nasal spray kit as needed.  0  . Oxycodone HCl 10 MG TABS Take 0.5-1 tablets (5-10 mg total) by mouth 3 (three) times daily as needed. 90 each 0  . tamsulosin (FLOMAX) 0.4 MG CAPS capsule Take 1 capsule by mouth daily.  2  . temazepam (RESTORIL) 30 MG capsule TAKE 1-2 CAPSULES BY MOUTH AT BEDTIME AS NEEDED FOR SLEEP 30 capsule 5  . VIAGRA 100 MG tablet Take 0.5-1 tablets (50-100 mg total) by mouth daily as needed for erectile dysfunction. 5  tablet 5  . vitamin B-12 (CYANOCOBALAMIN) 1000 MCG tablet Take 2,000 mcg by mouth daily.     . Vitamin D, Ergocalciferol, (DRISDOL) 50000 units CAPS capsule TAKE 1 CAPSULE BY MOUTH EVERY 7 DAYS 12 capsule 0   No facility-administered medications prior to visit.     ROS: Review of Systems  Constitutional: Positive for unexpected weight change. Negative for appetite change and fatigue.  HENT: Negative for congestion, nosebleeds, sneezing, sore throat and trouble swallowing.   Eyes: Negative for itching and visual disturbance.  Respiratory: Negative for cough.   Cardiovascular: Negative for chest pain, palpitations and leg swelling.  Gastrointestinal: Negative for abdominal distention, blood in stool, diarrhea and nausea.  Genitourinary: Negative for frequency and hematuria.  Musculoskeletal: Positive for arthralgias, back pain, gait problem, myalgias and neck stiffness. Negative for joint swelling and neck pain.  Skin: Negative for rash.  Neurological: Negative for dizziness, tremors, speech difficulty and weakness.  Psychiatric/Behavioral: Positive for decreased concentration. Negative for agitation, dysphoric mood, sleep disturbance and suicidal ideas. The patient is nervous/anxious.     Objective:  BP 132/84 (BP Location: Left Arm, Patient Position: Sitting, Cuff Size: Normal)   Pulse 77   Temp 97.7 F (36.5 C) (Oral)   Ht 6' 1"  (1.854 m)  Wt 234 lb (106.1 kg)   SpO2 97%   BMI 30.87 kg/m   BP Readings from Last 3 Encounters:  12/31/17 132/84  10/01/17 (!) 142/92  08/01/17 109/81    Wt Readings from Last 3 Encounters:  12/31/17 234 lb (106.1 kg)  10/01/17 228 lb (103.4 kg)  08/01/17 227 lb (103 kg)    Physical Exam  Constitutional: He is oriented to person, place, and time. He appears well-developed. No distress.  NAD  HENT:  Mouth/Throat: Oropharynx is clear and moist.  Eyes: Pupils are equal, round, and reactive to light. Conjunctivae are normal.  Neck: Normal  range of motion. No JVD present. No thyromegaly present.  Cardiovascular: Normal rate, regular rhythm, normal heart sounds and intact distal pulses. Exam reveals no gallop and no friction rub.  No murmur heard. Pulmonary/Chest: Effort normal and breath sounds normal. No respiratory distress. He has no wheezes. He has no rales. He exhibits no tenderness.  Abdominal: Soft. Bowel sounds are normal. He exhibits no distension and no mass. There is no rebound and no guarding.  Musculoskeletal: Normal range of motion. He exhibits tenderness. He exhibits no edema.  Lymphadenopathy:    He has no cervical adenopathy.  Neurological: He is alert and oriented to person, place, and time. He has normal reflexes. No cranial nerve deficit. He exhibits normal muscle tone. He displays a negative Romberg sign. Coordination abnormal. Gait normal.  Skin: Skin is warm and dry. No rash noted.  Psychiatric: His behavior is normal. Judgment and thought content normal.  obese LS, LEs tender  Lab Results  Component Value Date   WBC 6.6 06/29/2016   HGB 14.7 06/29/2016   HCT 42.2 06/29/2016   PLT 254.0 06/29/2016   GLUCOSE 102 (H) 04/06/2017   CHOL 174 06/29/2016   TRIG 148.0 06/29/2016   HDL 55.50 06/29/2016   LDLCALC 89 06/29/2016   ALT 26 06/29/2016   AST 16 06/29/2016   NA 137 04/06/2017   K 4.0 04/06/2017   CL 101 04/06/2017   CREATININE 0.92 04/06/2017   BUN 10 04/06/2017   CO2 28 04/06/2017   TSH 1.84 06/29/2016   PSA 0.76 06/29/2016   INR 0.96 06/30/2013   HGBA1C 5.5 04/06/2017    Dg Foot Complete Left  Result Date: 10/02/2017 CLINICAL DATA:  History of gout and false. No recent injury. Two weeks of generalized foot pain and swelling. EXAM: LEFT FOOT - COMPLETE 3+ VIEW COMPARISON:  None in PACs FINDINGS: The bones are subjectively adequately mineralized. The interphalangeal and second through fifth MTP joint spaces are reasonably well maintained. There is very mild narrowing of the first MTP  joint. No erosive changes are observed. The soft tissues exhibit no abnormal calcifications. Mild swelling of the forefoot is present. The tarsals and metatarsals are intact. There is a plantar calcaneal spur. IMPRESSION: There is no acute bony abnormality of the left foot. No erosive changes are observed. Minimal degenerative narrowing of the first MTP joint. Mild soft tissue swelling of the forefoot without abnormal calcifications. Electronically Signed   By: David  Martinique M.D.   On: 10/02/2017 08:19    Assessment & Plan:   There are no diagnoses linked to this encounter.   No orders of the defined types were placed in this encounter.    Follow-up: No follow-ups on file.  Walker Kehr, MD

## 2017-12-31 NOTE — Addendum Note (Signed)
Addended by: Karren Cobble on: 12/31/2017 04:06 PM   Modules accepted: Orders

## 2017-12-31 NOTE — Patient Instructions (Signed)

## 2018-01-02 ENCOUNTER — Other Ambulatory Visit: Payer: Self-pay

## 2018-01-02 MED ORDER — LOVASTATIN 40 MG PO TABS: 40.0000 mg | ORAL_TABLET | Freq: Every day | ORAL | 3 refills | Status: DC

## 2018-01-29 ENCOUNTER — Other Ambulatory Visit: Payer: Self-pay | Admitting: Family Medicine

## 2018-03-01 ENCOUNTER — Other Ambulatory Visit: Payer: Self-pay

## 2018-03-01 MED ORDER — BUPROPION HCL ER (XL) 150 MG PO TB24: 300.0000 mg | ORAL_TABLET | Freq: Every day | ORAL | 1 refills | Status: DC

## 2018-03-22 ENCOUNTER — Emergency Department (HOSPITAL_COMMUNITY): Payer: Medicare Other

## 2018-03-22 ENCOUNTER — Other Ambulatory Visit: Payer: Self-pay

## 2018-03-22 ENCOUNTER — Encounter (HOSPITAL_COMMUNITY): Payer: Self-pay | Admitting: *Deleted

## 2018-03-22 DIAGNOSIS — Z87891 Personal history of nicotine dependence: Secondary | ICD-10-CM | POA: Diagnosis not present

## 2018-03-22 DIAGNOSIS — I10 Essential (primary) hypertension: Secondary | ICD-10-CM | POA: Insufficient documentation

## 2018-03-22 DIAGNOSIS — R062 Wheezing: Secondary | ICD-10-CM | POA: Diagnosis not present

## 2018-03-22 DIAGNOSIS — Z79899 Other long term (current) drug therapy: Secondary | ICD-10-CM | POA: Insufficient documentation

## 2018-03-22 DIAGNOSIS — R05 Cough: Secondary | ICD-10-CM | POA: Diagnosis present

## 2018-03-22 DIAGNOSIS — R0602 Shortness of breath: Secondary | ICD-10-CM | POA: Diagnosis not present

## 2018-03-22 DIAGNOSIS — J189 Pneumonia, unspecified organism: Secondary | ICD-10-CM | POA: Insufficient documentation

## 2018-03-22 NOTE — ED Triage Notes (Signed)
Pt's wife reports she was giving the pt cough medication tonight before going to bed when she noticed his fingers were blue.  She checked his pulse ox and it was 82%.  Pt denies any diff breathing but his voice is hoarse.  He endorses productive cough with clear mucus.  He denies any cp.

## 2018-03-23 ENCOUNTER — Emergency Department (HOSPITAL_COMMUNITY)
Admission: EM | Admit: 2018-03-23 | Discharge: 2018-03-23 | Disposition: A | Discharge status: Home / Self Care | Payer: Medicare Other | Attending: Emergency Medicine | Admitting: Emergency Medicine

## 2018-03-23 ENCOUNTER — Other Ambulatory Visit: Payer: Self-pay

## 2018-03-23 DIAGNOSIS — J189 Pneumonia, unspecified organism: Secondary | ICD-10-CM | POA: Diagnosis not present

## 2018-03-23 DIAGNOSIS — R062 Wheezing: Secondary | ICD-10-CM

## 2018-03-23 DIAGNOSIS — J181 Lobar pneumonia, unspecified organism: Secondary | ICD-10-CM

## 2018-03-23 MED ORDER — PREDNISONE 20 MG PO TABS
60.0000 mg | ORAL_TABLET | Freq: Once | ORAL | Status: AC
  Administered 2018-03-23: 60 mg via ORAL
  Filled 2018-03-23: qty 3

## 2018-03-23 MED ORDER — DOXYCYCLINE HYCLATE 100 MG PO CAPS: 100.0000 mg | ORAL_CAPSULE | Freq: Two times a day (BID) | ORAL | 0 refills | Status: AC

## 2018-03-23 MED ORDER — AEROCHAMBER PLUS FLO-VU MEDIUM MISC
1.0000 | Freq: Once | Status: AC
  Administered 2018-03-23: 1
  Filled 2018-03-23: qty 1

## 2018-03-23 MED ORDER — IPRATROPIUM-ALBUTEROL 0.5-2.5 (3) MG/3ML IN SOLN
3.0000 mL | Freq: Once | RESPIRATORY_TRACT | Status: AC
  Administered 2018-03-23: 3 mL via RESPIRATORY_TRACT
  Filled 2018-03-23: qty 3

## 2018-03-23 MED ORDER — AMOXICILLIN-POT CLAVULANATE 875-125 MG PO TABS: 1.0000 | ORAL_TABLET | Freq: Two times a day (BID) | ORAL | 0 refills | Status: AC

## 2018-03-23 MED ORDER — ONDANSETRON 4 MG PO TBDP
4.0000 mg | ORAL_TABLET | Freq: Once | ORAL | Status: AC
  Administered 2018-03-23: 4 mg via ORAL
  Filled 2018-03-23: qty 1

## 2018-03-23 MED ORDER — ALBUTEROL SULFATE HFA 108 (90 BASE) MCG/ACT IN AERS
2.0000 | INHALATION_SPRAY | Freq: Once | RESPIRATORY_TRACT | Status: AC
  Administered 2018-03-23: 2 via RESPIRATORY_TRACT
  Filled 2018-03-23: qty 6.7

## 2018-03-23 MED ORDER — DOXYCYCLINE HYCLATE 100 MG PO TABS
100.0000 mg | ORAL_TABLET | Freq: Once | ORAL | Status: AC
  Administered 2018-03-23: 100 mg via ORAL
  Filled 2018-03-23: qty 1

## 2018-03-23 MED ORDER — PREDNISONE 20 MG PO TABS: 40.0000 mg | ORAL_TABLET | Freq: Every day | ORAL | 0 refills | Status: AC

## 2018-03-23 NOTE — ED Provider Notes (Signed)
[facility] DEPT Provider Note   CSN: [id number] Arrival date & time: 03/22/18  2128     History   Chief Complaint Chief Complaint  Patient presents with  . Nasal Congestion  . hypoxia    HPI Lee Owens is a 67 y.o. male.  HPI 67 year old male with past medical history as below including previous alcohol abuse here with cough and shortness of breath.  The patient reportedly has had cough, shortness of breath, and sputum production for the last several days.  He said a mild, generalized headache as well.  He has been taking over-the-counter medications with mild relief.  Tonight, he seemed more short of breath than usual.  His wife reportedly checked his pulse ox, and it was 82.  She believes that he appeared darker or cyanotic at the time.  He took some deep breaths and symptoms are now resolved.  He had taking some cough medicine prior to this.  He currently denies any shortness of breath.  He does endorse significant cough with sputum production.  Is had subjective chills but no known fevers.  Denies known history of lung disease, but does have a extensive history of smoking.  Has had occasional wheezing.  No abdominal pain.  No neck pain or neck stiffness.  He states he feels better here and is requesting discharge.  Past Medical History:  Diagnosis Date  . Allergic rhinitis   . Anxiety    takes Valium bid  . Arthritis    "hips, knees, hands" (07/07/2013)  . Back pain    occasionally but reason unknown  . BPH (benign prostatic hypertrophy)    takes Proscar daily  . Chronic fatigue   . Complication of anesthesia    Per pt, "Hard to sedate" past last colonoscopy in 2008!  . Depression    takes Cymbalta daily  . Diverticulosis   . Dizziness    due to dehydration  . Hemorrhoids   . Hyperlipidemia    takes Lovastatin daily  . Hypertension   . Insomnia    takes Restoril nightly   . Joint pain    knees/ankles/back pain  . Joint swelling     . Muscle pain    takes Baclofen daily  . Night muscle spasms    takes Valium as needed  . Pneumonia 1971  . Right knee DJD   . Urinary frequency    takes rapaflo daily  . Vitamin B12 deficiency 2009  . Vitamin D deficiency disease     Patient Active Problem List   Diagnosis Date Noted  . Obesity (BMI 30-39.9) 12/31/2017  . Foot pain, left 10/01/2017  . 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 Cervical radiculopathy 05/14/2017  . Depression 04/06/2017  . Low back pain 01/03/2017  . Orthostatic dizziness 07/05/2016  . Hematuria 11/10/2015  . Dyslipidemia 07/12/2014  . Breast pain, right 01/13/2014  . 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 Right knee DJD   . Left knee DJD 05/12/2013  . DJD (degenerative joint disease) of knee 05/12/2013  . Hyperlipidemia   . Hypertension   . Insomnia   . Anxiety   . Vitamin B12 deficiency   . Benign prostatic hyperplasia   . URI (upper respiratory infection) 07/04/2012  . Hyperglycemia 07/04/2012  . Constipation 04/03/2012  . Knee osteoarthritis 09/27/2011  . Well adult exam 09/27/2011  . Hypogonadism, male 11/30/2010  . Preop exam for internal medicine 11/30/2010  . Neoplasm of uncertain behavior of skin 04/28/2010  .  CERUMEN IMPACTION 04/28/2010  . CHEST PAIN, RIGHT 01/22/2009  . TOBACCO USE, QUIT 01/22/2009  . Vitamin D deficiency 11/29/2007  . Myalgia 11/29/2007  . ALLERGIC RHINITIS 08/29/2007  . HERNIA 08/29/2007  . Rosacea 08/29/2007  . SLEEP DISORDER 08/29/2007  . FATIGUE 08/29/2007  . PSA, INCREASED 08/29/2007    Past Surgical History:  Procedure Laterality Date  . COLONOSCOPY    . JOINT REPLACEMENT     both knees  . KNEE ARTHROSCOPY Bilateral 2012  . PROSTATE BIOPSY  01/2010  . 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   Procedure: TOTAL KNEE ARTHROPLASTY- left;  Surgeon: Lorn Junes, MD;  Location: [facility];  Service: Orthopedics;  Laterality: Left;  . TOTAL KNEE ARTHROPLASTY Right 07/07/2013  . TOTAL  KNEE ARTHROPLASTY Right 07/07/2013   Procedure: TOTAL KNEE ARTHROPLASTY- right;  Surgeon: Lorn Junes, MD;  Location: [facility];  Service: Orthopedics;  Laterality: Right;        Home Medications    Prior to Admission medications   Medication Sig Start Date End Date Taking? Authorizing Provider  allopurinol (ZYLOPRIM) 300 MG tablet Take 1 tablet (300 mg total) by mouth daily. 12/31/17   Plotnikov, Evie Lacks, MD  amoxicillin-clavulanate (AUGMENTIN) 875-125 MG tablet Take 1 tablet by mouth every 12 (twelve) hours for 7 days. 03/23/18 03/30/18  Duffy Bruce, MD  buPROPion (WELLBUTRIN XL) 150 MG 24 hr tablet Take 2 tablets (300 mg total) by mouth daily. 03/01/18   Plotnikov, Evie Lacks, MD  Cholecalciferol (VITAMIN D-3) 5000 UNITS TABS Take 5,000 Units by mouth daily.    [provider]  diazepam (VALIUM) 10 MG tablet Take 1 tablet (10 mg total) by mouth every 12 (twelve) hours as needed for anxiety. 12/31/17   Plotnikov, Evie Lacks, MD  doxycycline (VIBRAMYCIN) 100 MG capsule Take 1 capsule (100 mg total) by mouth 2 (two) times daily for 7 days. 03/23/18 03/30/18  Duffy Bruce, MD  DULoxetine (CYMBALTA) 60 MG capsule Take 1 capsule (60 mg total) by mouth 2 (two) times daily. 09/03/17   Plotnikov, Evie Lacks, MD  fentaNYL (DURAGESIC) 25 MCG/HR patch Place 1 patch (25 mcg total) onto the skin every other day. Please fill on or after 03/05/18 12/31/17   Plotnikov, Evie Lacks, MD  finasteride (PROSCAR) 5 MG tablet Take 5 mg by mouth daily.    [provider]  gabapentin (NEURONTIN) 100 MG capsule TAKE 2 CAPSULES(200 MG) BY MOUTH AT BEDTIME 12/12/17   Lyndal Pulley, DO  L-Methylfolate-Algae (DEPLIN 15) 15-90.314 MG CAPS 1 po qd 04/06/17   Plotnikov, Evie Lacks, MD  lovastatin (MEVACOR) 40 MG tablet Take 1 tablet (40 mg total) by mouth at bedtime. 01/02/18   Plotnikov, Evie Lacks, MD  magnesium gluconate (MAGONATE) 30 MG tablet Take 1 tablet (30 mg total) by mouth 2 (two) times daily. 01/05/16    Plotnikov, Evie Lacks, MD  NARCAN 4 MG/0.1ML LIQD nasal spray kit as needed. 03/05/17   [provider]  Oxycodone HCl 10 MG TABS Take 0.5-1 tablets (5-10 mg total) by mouth 3 (three) times daily as needed. 12/31/17   Plotnikov, Evie Lacks, MD  predniSONE (DELTASONE) 20 MG tablet Take 2 tablets (40 mg total) by mouth daily for 5 days. 03/23/18 03/28/18  Duffy Bruce, MD  tamsulosin (FLOMAX) 0.4 MG CAPS capsule Take 1 capsule by mouth daily. 10/29/15   [provider]  temazepam (RESTORIL) 30 MG capsule TAKE 1-2 CAPSULES BY MOUTH AT BEDTIME AS NEEDED FOR SLEEP 10/14/17   Plotnikov, [employer]  V, MD  VIAGRA 100 MG tablet Take 0.5-1 tablets (50-100 mg total) by mouth daily as needed for erectile dysfunction. 07/19/17   Plotnikov, Evie Lacks, MD  vitamin B-12 (CYANOCOBALAMIN) 1000 MCG tablet Take 2,000 mcg by mouth daily.     [provider]  Vitamin D, Ergocalciferol, (DRISDOL) 50000 units CAPS capsule TAKE 1 CAPSULE BY MOUTH EVERY 7 DAYS 12/31/17   Plotnikov, Evie Lacks, MD  Vitamin D, Ergocalciferol, (DRISDOL) 50000 units CAPS capsule TAKE 1 CAPSULE BY MOUTH EVERY 7 DAYS 01/29/18   Lyndal Pulley, DO    Family History Family History  Problem Relation Age of Onset  . Liver cancer Mother   . Heart failure Father   . Kidney disease Father   . Atrial fibrillation Father   . Coronary artery disease Father   . Hypertension Other   . Coronary artery disease Other   . Dementia Sister     Social History Social History   Tobacco Use  . Smoking status: Former Smoker    Packs/day: 1.00    Years: 38.00    Pack years: 38.00    Types: Cigarettes    Last attempt to quit: 10/23/2005    Years since quitting: 12.4  . Smokeless tobacco: Never Used  . Tobacco comment: quit smoking 2008  Substance Use Topics  . Alcohol use: Yes    Alcohol/week: 14.0 standard drinks    Types: 14 Cans of beer per week    Comment: 03/09/17 "2 beers daily"  . Drug use: No    Comment: quit age 22      Allergies   Patient has no known allergies.   Review of Systems Review of Systems  Constitutional: Positive for fatigue. Negative for chills and fever.  HENT: Negative for congestion and rhinorrhea.   Eyes: Negative for visual disturbance.  Respiratory: Positive for cough, shortness of breath and wheezing.   Cardiovascular: Negative for chest pain and leg swelling.  Gastrointestinal: Negative for abdominal pain, diarrhea, nausea and vomiting.  Genitourinary: Negative for dysuria and flank pain.  Musculoskeletal: Negative for neck pain and neck stiffness.  Skin: Negative for rash and wound.  Allergic/Immunologic: Negative for immunocompromised state.  Neurological: Negative for syncope, weakness and headaches.  All other systems reviewed and are negative.    Physical Exam Updated Vital Signs BP (!) 156/97 (BP Location: Left Arm)   Pulse (!) 101   Temp 98.3 F (36.8 C) (Oral)   Resp 18   SpO2 98%   Physical Exam Vitals signs and nursing note reviewed.  Constitutional:      General: He is not in acute distress.    Appearance: He is well-developed.  HENT:     Head: Normocephalic and atraumatic.  Eyes:     Conjunctiva/sclera: Conjunctivae normal.  Neck:     Musculoskeletal: Neck supple.  Cardiovascular:     Rate and Rhythm: Normal rate and regular rhythm.     Heart sounds: Normal heart sounds. No murmur. No friction rub.  Pulmonary:     Effort: Pulmonary effort is normal. No respiratory distress.     Breath sounds: Examination of the right-upper field reveals wheezing. Examination of the left-upper field reveals wheezing. Examination of the right-middle field reveals wheezing. Examination of the left-middle field reveals wheezing. Examination of the right-lower field reveals wheezing. Examination of the left-lower field reveals wheezing. Wheezing (Scant, expiratory) present. No rales.  Abdominal:     General: There is no distension.     Palpations: Abdomen is soft.  Tenderness: There is no abdominal tenderness.  Skin:    General: Skin is warm.     Capillary Refill: Capillary refill takes less than 2 seconds.  Neurological:     Mental Status: He is alert and oriented to person, place, and time.     Motor: No abnormal muscle tone.      ED Treatments / Results  Labs (all labs ordered are listed, but only abnormal results are displayed) Labs Reviewed - No data to display  EKG None  Radiology Dg Chest 2 View  Result Date: 03/22/2018 CLINICAL DATA:  Shortness of breath EXAM: CHEST - 2 VIEW COMPARISON:  May 14, 2017 FINDINGS: The heart size and mediastinal contours are within normal limits. There is mild atelectasis of left lung base. There is no focal infiltrate, pulmonary edema, or pleural effusion. The visualized skeletal structures are stable. IMPRESSION: No focal pneumonia.  Mild atelectasis of left lung base. Electronically Signed   By: Abelardo Diesel M.D.   On: 03/22/2018 22:40    Procedures Procedures (including critical care time)  Medications Ordered in ED Medications  predniSONE (DELTASONE) tablet 60 mg (60 mg Oral Given 03/23/18 0131)  ipratropium-albuterol (DUONEB) 0.5-2.5 (3) MG/3ML nebulizer solution 3 mL (3 mLs Nebulization Given 03/23/18 0133)  doxycycline (VIBRA-TABS) tablet 100 mg (100 mg Oral Given 03/23/18 0236)  albuterol (PROVENTIL HFA;VENTOLIN HFA) 108 (90 Base) MCG/ACT inhaler 2 puff (2 puffs Inhalation Given 03/23/18 0240)  AEROCHAMBER PLUS FLO-VU MEDIUM MISC 1 each (1 each Other Given 03/23/18 0240)  ondansetron (ZOFRAN-ODT) disintegrating tablet 4 mg (4 mg Oral Given 03/23/18 0237)     Initial Impression / Assessment and Plan / ED Course  I have reviewed the triage vital signs and the nursing notes.  Pertinent labs & imaging results that were available during my care of the patient were reviewed by me and considered in my medical decision making (see chart for details).     67 year old male here with  cough, shortness of breath, mild headache, and reported episode of transient hypoxia at home.  On arrival here, he is satting greater than 90% on room air.  He has diffuse wheezing, sputum production, and I suspect he has an acute viral bronchitis with known sick contacts.  He has some atelectasis in the left lung base which clinically, could represent pneumonia so will treat as such.  Patient was given nebulizer with excellent improvement in his breathing and shortness of breath.  He had significant sputum production after the treatment which I suspect is due to dilation of mild bronchospasm.  He was ambulated throughout the ED prior to and after his breathing treatments with no hypoxia or increased shortness of breath.  Given the absence of any hypoxia here and ambulation without hypoxia, discussion had with patient and his wife, who is a Marine scientist.  Patient would like to attempt outpatient management which I think is reasonable.  Will place on steroids, antibiotics, and discharged with outpatient follow-up.  Advised fluids.  Avoid any kind of sedating medications given his history of sleep apnea.  Final Clinical Impressions(s) / ED Diagnoses   Final diagnoses:  Community acquired pneumonia of left lower lobe of lung ([place])  Wheezing    ED Discharge Orders         Ordered    doxycycline (VIBRAMYCIN) 100 MG capsule  2 times daily     03/23/18 0312    amoxicillin-clavulanate (AUGMENTIN) 875-125 MG tablet  Every 12 hours     03/23/18 0312  predniSONE (DELTASONE) 20 MG tablet  Daily     03/23/18 5320           Duffy Bruce, MD 03/23/18 [phone]

## 2018-03-23 NOTE — Discharge Instructions (Addendum)
For your cough/cold:  Take the antibiotics as prescribed.  Use the albuterol inhaler every 4 hours for wheezing for 1-2 days, then as needed  If you notice worsening headache, shortness of breath, or skin changes, check your oxygen at home

## 2018-04-03 ENCOUNTER — Encounter: Payer: Self-pay | Admitting: Internal Medicine

## 2018-04-03 ENCOUNTER — Ambulatory Visit (INDEPENDENT_AMBULATORY_CARE_PROVIDER_SITE_OTHER): Payer: Medicare Other | Admitting: Internal Medicine

## 2018-04-03 VITALS — BP 142/90 | HR 88 | Temp 98.4°F | Ht 73.0 in | Wt 225.0 lb

## 2018-04-03 DIAGNOSIS — J181 Lobar pneumonia, unspecified organism: Secondary | ICD-10-CM | POA: Diagnosis not present

## 2018-04-03 DIAGNOSIS — M5442 Lumbago with sciatica, left side: Secondary | ICD-10-CM

## 2018-04-03 DIAGNOSIS — E785 Hyperlipidemia, unspecified: Secondary | ICD-10-CM | POA: Diagnosis not present

## 2018-04-03 DIAGNOSIS — M5441 Lumbago with sciatica, right side: Secondary | ICD-10-CM

## 2018-04-03 DIAGNOSIS — Z0000 Encounter for general adult medical examination without abnormal findings: Secondary | ICD-10-CM | POA: Diagnosis not present

## 2018-04-03 DIAGNOSIS — E538 Deficiency of other specified B group vitamins: Secondary | ICD-10-CM | POA: Diagnosis not present

## 2018-04-03 DIAGNOSIS — J189 Pneumonia, unspecified organism: Secondary | ICD-10-CM

## 2018-04-03 DIAGNOSIS — E559 Vitamin D deficiency, unspecified: Secondary | ICD-10-CM

## 2018-04-03 DIAGNOSIS — G8929 Other chronic pain: Secondary | ICD-10-CM | POA: Diagnosis not present

## 2018-04-03 DIAGNOSIS — I10 Essential (primary) hypertension: Secondary | ICD-10-CM | POA: Diagnosis not present

## 2018-04-03 MED ORDER — FENTANYL 25 MCG/HR TD PT72: 25.0000 ug | MEDICATED_PATCH | TRANSDERMAL | 0 refills | Status: DC

## 2018-04-03 MED ORDER — OXYCODONE HCL 10 MG PO TABS: 5.0000 mg | ORAL_TABLET | Freq: Three times a day (TID) | ORAL | 0 refills | Status: DC | PRN

## 2018-04-03 NOTE — Assessment & Plan Note (Signed)
Losartan 

## 2018-04-03 NOTE — Assessment & Plan Note (Signed)
Vit D 

## 2018-04-03 NOTE — Assessment & Plan Note (Signed)
Duragesic, Oxy IR  Potential benefits of a long term opioids use as well as potential risks (i.e. addiction risk, apnea etc) and complications (i.e. Somnolence, constipation and others) were explained to the patient and were aknowledged.  Water exercises, dry needling, PT

## 2018-04-03 NOTE — Patient Instructions (Signed)

## 2018-04-03 NOTE — Progress Notes (Signed)
Subjective:  Patient ID: Lee Owens, male    DOB: Apr 04, 1950  Age: 68 y.o. MRN: [id number]  CC: No chief complaint on file.   HPI Lee Owens presents for back pain, arthralgias, depression f/u C/o CAP over Christmas - ?LLL - treated  Outpatient Medications Prior to Visit  Medication Sig Dispense Refill  . allopurinol (ZYLOPRIM) 300 MG tablet Take 1 tablet (300 mg total) by mouth daily. 30 tablet 11  . buPROPion (WELLBUTRIN XL) 150 MG 24 hr tablet Take 2 tablets (300 mg total) by mouth daily. 180 tablet 1  . Cholecalciferol (VITAMIN D-3) 5000 UNITS TABS Take 5,000 Units by mouth daily.    . diazepam (VALIUM) 10 MG tablet Take 1 tablet (10 mg total) by mouth every 12 (twelve) hours as needed for anxiety. 60 tablet 2  . DULoxetine (CYMBALTA) 60 MG capsule Take 1 capsule (60 mg total) by mouth 2 (two) times daily. 180 capsule 3  . fentaNYL (DURAGESIC) 25 MCG/HR patch Place 1 patch (25 mcg total) onto the skin every other day. Please fill on or after 03/05/18 15 patch 0  . finasteride (PROSCAR) 5 MG tablet Take 5 mg by mouth daily.    Marland Kitchen gabapentin (NEURONTIN) 100 MG capsule TAKE 2 CAPSULES(200 MG) BY MOUTH AT BEDTIME 180 capsule 1  . L-Methylfolate-Algae (DEPLIN 15) 15-90.314 MG CAPS 1 po qd 30 capsule 11  . lovastatin (MEVACOR) 40 MG tablet Take 1 tablet (40 mg total) by mouth at bedtime. 90 tablet 3  . magnesium gluconate (MAGONATE) 30 MG tablet Take 1 tablet (30 mg total) by mouth 2 (two) times daily. 60 tablet 5  . NARCAN 4 MG/0.1ML LIQD nasal spray kit as needed.  0  . Oxycodone HCl 10 MG TABS Take 0.5-1 tablets (5-10 mg total) by mouth 3 (three) times daily as needed. 90 each 0  . tamsulosin (FLOMAX) 0.4 MG CAPS capsule Take 1 capsule by mouth daily.  2  . temazepam (RESTORIL) 30 MG capsule TAKE 1-2 CAPSULES BY MOUTH AT BEDTIME AS NEEDED FOR SLEEP 30 capsule 5  . VIAGRA 100 MG tablet Take 0.5-1 tablets (50-100 mg total) by mouth daily as needed for erectile dysfunction. 5 tablet 5   . vitamin B-12 (CYANOCOBALAMIN) 1000 MCG tablet Take 2,000 mcg by mouth daily.     . Vitamin D, Ergocalciferol, (DRISDOL) 50000 units CAPS capsule TAKE 1 CAPSULE BY MOUTH EVERY 7 DAYS 12 capsule 0  . Vitamin D, Ergocalciferol, (DRISDOL) 50000 units CAPS capsule TAKE 1 CAPSULE BY MOUTH EVERY 7 DAYS 12 capsule 0   No facility-administered medications prior to visit.     ROS: Review of Systems  Constitutional: Positive for fatigue. Negative for appetite change and unexpected weight change.  HENT: Negative for congestion, nosebleeds, sneezing, sore throat and trouble swallowing.   Eyes: Negative for itching and visual disturbance.  Respiratory: Negative for cough.   Cardiovascular: Negative for chest pain, palpitations and leg swelling.  Gastrointestinal: Negative for abdominal distention, blood in stool, diarrhea and nausea.  Genitourinary: Negative for frequency and hematuria.  Musculoskeletal: Positive for arthralgias, back pain, gait problem and myalgias. Negative for joint swelling and neck pain.  Skin: Negative for rash.  Neurological: Negative for dizziness, tremors, speech difficulty and weakness.  Psychiatric/Behavioral: Negative for agitation, dysphoric mood and sleep disturbance. The patient is nervous/anxious.     Objective:  BP (!) 142/90 (BP Location: Left Arm, Patient Position: Sitting, Cuff Size: Large)   Pulse 88   Temp 98.4 F (36.9 C) (Oral)  Ht _0  (1.854 m)   Wt 225 lb (102.1 kg)   SpO2 94%   BMI 29.69 kg/m   BP Readings from Last 3 Encounters:  04/03/18 (!) 142/90  03/23/18 (!) 156/97  12/31/17 132/84    Wt Readings from Last 3 Encounters:  04/03/18 225 lb (102.1 kg)  12/31/17 234 lb (106.1 kg)  10/01/17 228 lb (103.4 kg)    Physical Exam Constitutional:      General: He is not in acute distress.    Appearance: He is well-developed.     Comments: NAD  Eyes:     Conjunctiva/sclera: Conjunctivae normal.     Pupils: Pupils are equal, round, and  reactive to light.  Neck:     Musculoskeletal: Normal range of motion.     Thyroid: No thyromegaly.     Vascular: No JVD.  Cardiovascular:     Rate and Rhythm: Normal rate and regular rhythm.     Heart sounds: Normal heart sounds. No murmur. No friction rub. No gallop.   Pulmonary:     Effort: Pulmonary effort is normal. No respiratory distress.     Breath sounds: Normal breath sounds. No wheezing or rales.  Chest:     Chest wall: No tenderness.  Abdominal:     General: Bowel sounds are normal. There is no distension.     Palpations: Abdomen is soft. There is no mass.     Tenderness: There is no abdominal tenderness. There is no guarding or rebound.  Musculoskeletal: Normal range of motion.        General: Tenderness present.  Lymphadenopathy:     Cervical: No cervical adenopathy.  Skin:    General: Skin is warm and dry.     Findings: No rash.  Neurological:     Mental Status: He is alert and oriented to person, place, and time.     Cranial Nerves: No cranial nerve deficit.     Motor: No abnormal muscle tone.     Coordination: Coordination normal.     Gait: Gait abnormal.     Deep Tendon Reflexes: Reflexes are normal and symmetric.  Psychiatric:        Behavior: Behavior normal.        Thought Content: Thought content normal.        Judgment: Judgment normal.     Lab Results  Component Value Date   WBC 6.6 06/29/2016   HGB 14.7 06/29/2016   HCT 42.2 06/29/2016   PLT 254.0 06/29/2016   GLUCOSE 102 (H) 04/06/2017   CHOL 174 06/29/2016   TRIG 148.0 06/29/2016   HDL 55.50 06/29/2016   LDLCALC 89 06/29/2016   ALT 26 06/29/2016   AST 16 06/29/2016   NA 137 04/06/2017   K 4.0 04/06/2017   CL 101 04/06/2017   CREATININE 0.92 04/06/2017   BUN 10 04/06/2017   CO2 28 04/06/2017   TSH 1.84 06/29/2016   PSA 0.76 06/29/2016   INR 0.96 06/30/2013   HGBA1C 5.5 04/06/2017    No results found.  Assessment & Plan:   There are no diagnoses linked to this  encounter.   No orders of the defined types were placed in this encounter.    Follow-up: No follow-ups on file.  Walker Kehr, MD

## 2018-04-03 NOTE — Assessment & Plan Note (Signed)
B complex

## 2018-04-03 NOTE — Assessment & Plan Note (Signed)
12/19 LLL

## 2018-04-12 ENCOUNTER — Other Ambulatory Visit: Payer: Self-pay | Admitting: Internal Medicine

## 2018-04-29 ENCOUNTER — Other Ambulatory Visit: Payer: Self-pay | Admitting: Internal Medicine

## 2018-05-21 ENCOUNTER — Ambulatory Visit (INDEPENDENT_AMBULATORY_CARE_PROVIDER_SITE_OTHER)
Admission: RE | Admit: 2018-05-21 | Discharge: 2018-05-21 | Disposition: A | Discharge status: Home / Self Care | Payer: Self-pay | Source: Ambulatory Visit | Attending: Internal Medicine | Admitting: Internal Medicine

## 2018-05-21 DIAGNOSIS — E785 Hyperlipidemia, unspecified: Secondary | ICD-10-CM

## 2018-05-24 ENCOUNTER — Other Ambulatory Visit: Payer: Self-pay | Admitting: Internal Medicine

## 2018-05-24 DIAGNOSIS — I2583 Coronary atherosclerosis due to lipid rich plaque: Principal | ICD-10-CM

## 2018-05-24 DIAGNOSIS — I2510 Atherosclerotic heart disease of native coronary artery without angina pectoris: Secondary | ICD-10-CM

## 2018-05-24 MED ORDER — ASPIRIN EC 81 MG PO TBEC: 81.0000 mg | DELAYED_RELEASE_TABLET | Freq: Every day | ORAL | 3 refills | Status: DC

## 2018-05-30 ENCOUNTER — Other Ambulatory Visit: Payer: Self-pay | Admitting: Internal Medicine

## 2018-05-31 NOTE — Telephone Encounter (Signed)
Md is out of the office until next Wednesday pls advise if ok to refill./lmb

## 2018-05-31 NOTE — Telephone Encounter (Signed)
This sounds like an over the counter vitamin and can wait for PCP to evaluate.

## 2018-06-24 ENCOUNTER — Telehealth: Payer: Self-pay

## 2018-06-24 NOTE — Telephone Encounter (Signed)
TELEPHONE CALL NOTE  Lee Owens has been deemed a candidate for a follow-up tele-health visit to limit community exposure during the Covid-19 pandemic. I spoke with the patient via phone to ensure availability of phone/video source, confirm preferred email & phone number, and discuss instructions and expectations.  I reminded Lee Owens to be prepared with any vital sign and/or heart rhythm information that could potentially be obtained via home monitoring, at the time of his visit. I reminded Lee Owens to expect a phone call at the time of his visit if his visit.  Did the patient verbally acknowledge consent to treatment? Yes  Meryl Crutch, RN 06/24/2018 10:48 AM   DOWNLOADING THE [place]  - If Apple, go to App Store and type in WebEx in the search bar. [employer], the blue/green circle. The app is free but as with any other app downloads, their phone may require them to verify saved payment information or Apple password. The patient does NOT have to create an account.  - If Android, ask patient to go to [employer] and type in WebEx in the search bar. [employer], the blue/green circle. The app is free but as with any other app downloads, their phone may require them to verify saved payment information or Android password. The patient does NOT have to create an account.   CONSENT FOR TELE-HEALTH VISIT - PLEASE REVIEW  I hereby voluntarily request, consent and authorize [facility] and its employed or contracted physicians, physician assistants, nurse practitioners or other licensed health care professionals (the Practitioner), to provide me with telemedicine health care services (the "Services") as deemed necessary by the treating Practitioner. I acknowledge and consent to receive the Services by the Practitioner via telemedicine. I understand that the telemedicine visit will involve communicating with the  Practitioner through live audiovisual communication technology and the disclosure of certain medical information by electronic transmission. I acknowledge that I have been given the opportunity to request an in-person assessment or other available alternative prior to the telemedicine visit and am voluntarily participating in the telemedicine visit.  I understand that I have the right to withhold or withdraw my consent to the use of telemedicine in the course of my care at any time, without affecting my right to future care or treatment, and that the Practitioner or I may terminate the telemedicine visit at any time. I understand that I have the right to inspect all information obtained and/or recorded in the course of the telemedicine visit and may receive copies of available information for a reasonable fee.  I understand that some of the potential risks of receiving the Services via telemedicine include:  Marland Kitchen Delay or interruption in medical evaluation due to technological equipment failure or disruption; . Information transmitted may not be sufficient (e.g. poor resolution of images) to allow for appropriate medical decision making by the Practitioner; and/or  . In rare instances, security protocols could fail, causing a breach of personal health information.  Furthermore, I acknowledge that it is my responsibility to provide information about my medical history, conditions and care that is complete and accurate to the best of my ability. I acknowledge that Practitioner's advice, recommendations, and/or decision may be based on factors not within their control, such as incomplete or inaccurate data provided by me or distortions of diagnostic images or specimens that may result from electronic transmissions. I understand that the practice of medicine is not an exact science and  that Practitioner makes no warranties or guarantees regarding treatment outcomes. I acknowledge that I will receive a copy of this  consent concurrently upon execution via email to the email address I last provided but may also request a printed copy by calling the office of [place].    I understand that my insurance will be billed for this visit.   I have read or had this consent read to me. . I understand the contents of this consent, which adequately explains the benefits and risks of the Services being provided via telemedicine.  . I have been provided ample opportunity to ask questions regarding this consent and the Services and have had my questions answered to my satisfaction. . I give my informed consent for the services to be provided through the use of telemedicine in my medical care  By participating in this telemedicine visit I agree to the above.

## 2018-06-25 ENCOUNTER — Other Ambulatory Visit: Payer: Self-pay

## 2018-06-25 ENCOUNTER — Telehealth (INDEPENDENT_AMBULATORY_CARE_PROVIDER_SITE_OTHER): Payer: Medicare Other | Admitting: Cardiology

## 2018-06-25 ENCOUNTER — Encounter: Payer: Self-pay | Admitting: Cardiology

## 2018-06-25 VITALS — BP 134/95 | HR 81 | Ht 73.0 in | Wt 222.8 lb

## 2018-06-25 DIAGNOSIS — I77810 Thoracic aortic ectasia: Secondary | ICD-10-CM | POA: Diagnosis not present

## 2018-06-25 DIAGNOSIS — I2584 Coronary atherosclerosis due to calcified coronary lesion: Secondary | ICD-10-CM | POA: Diagnosis not present

## 2018-06-25 DIAGNOSIS — Z712 Person consulting for explanation of examination or test findings: Secondary | ICD-10-CM

## 2018-06-25 DIAGNOSIS — Z7189 Other specified counseling: Secondary | ICD-10-CM

## 2018-06-25 DIAGNOSIS — Z713 Dietary counseling and surveillance: Secondary | ICD-10-CM

## 2018-06-25 DIAGNOSIS — I2510 Atherosclerotic heart disease of native coronary artery without angina pectoris: Secondary | ICD-10-CM | POA: Insufficient documentation

## 2018-06-25 MED ORDER — ROSUVASTATIN CALCIUM 10 MG PO TABS: 10.0000 mg | ORAL_TABLET | Freq: Every day | ORAL | 11 refills | Status: DC

## 2018-06-25 NOTE — Patient Instructions (Signed)
Medication Instructions:  Stop: Lovastatin 40 mg Start: Rosuvastatin 10 mg daily  If you need a refill on your cardiac medications before your next appointment, please call your pharmacy.   Lab work: None   Testing/Procedures: None  Follow-Up: At [facility], you and your health needs are our priority.  As part of our continuing mission to provide you with exceptional heart care, we have created designated Provider Care Teams.  These Care Teams include your primary Cardiologist (physician) and Advanced Practice Providers (APPs -  Physician Assistants and Nurse Practitioners) who all work together to provide you with the care you need, when you need it. You will need a follow up appointment in 4 months.  Please call our office 2 months in advance to schedule this appointment.  You may see Dr. Harrell Gave or one of the following Advanced Practice Providers on your designated Care Team:   Rosaria Ferries, PA-C . Jory Sims, DNP, ANP

## 2018-06-25 NOTE — Progress Notes (Signed)
Virtual Visit via Telephone Note    Evaluation Performed:  Establish care  This visit type was conducted due to national recommendations for restrictions regarding the COVID-19 Pandemic (e.g. social distancing).  This format is felt to be most appropriate for this patient at this time.  All issues noted in this document were discussed and addressed.  No physical exam was performed (except for noted visual exam findings with Video Visits).  Please refer to the patient's chart (MyChart message for video visits and phone note for telephone visits) for the patient's consent to telehealth for [facility].  Date:  06/25/2018   ID:  Lee Owens, DOB 25-Feb-1951, MRN [id number]  Patient Location:  [address] [place],  [address]  Provider location:   [place], [place] Office  PCP:  Cassandria Anger, MD  Cardiologist:  Buford Dresser (new)  Chief Complaint:  Review test results, discuss risk  History of Present Illness:    Lee Owens is a 68 y.o. male who presents via audio/video conferencing for a telehealth visit today.    The patient does not have symptoms concerning for COVID-19 infection (fever, chills, cough, or new shortness of breath).   PMH of hypertension (on losartan). He had a cardiac calcium score on 05/21/18 ordered by Dr. Alain Marion. 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 There was coronary calcium noted in LM and LAD, with score of 142 (55th percentile).  Patient and his wife both on the phone today. Their concerns today: -discuss ct scan. Worried about lung cancer, as he is a former smoker -men in his family seems to get HF in their 21s. Father had afib. Worried about his risk.  Cardiovascular risk factors: Prior clinical ASCVD: none Comorbid conditions: hyperlipidemia, depression/anxiety Chronic inflammatory conditions: Tobacco use history: age [age], about 1ppd. Quit on Chantix 09/2005.  Family history: multiple men with heart failure in their 5s, father had afib and heart failure in his 75s. Prior cardiac testing and/or incidental findings on other testing (ie coronary calcium): Exercise level: [facility], he and his wife alternated health issues and slowed down on exercise. Does well on recumbent bike. Current diet: loves sugar. Wife tries to cook healthy for him. Eats once/day usually but large portions. Emotionally eats to treat his diet/anxiety/pain  Interested in preventative screening: vascu-screen, high res CT scan, lipids due in July.  Was on simvastatin 2015. He isn't sure why it was changed, but he has had myalgias for decades, no matter if he was on or off a statin. He wants to be on aggressive prevention. We discussed recommendations re: high intensity, atorvastatin vs. Rosuvastatin.  Denies chest pain, shortness of breath at rest or with normal exertion. No PND, orthopnea, LE edema or unexpected weight gain. No syncope or palpitations.  Prior CV studies:   The following studies were reviewed today: CT cardiac as above.  Past Medical History:  Diagnosis Date  . Allergic rhinitis   . Anxiety    takes Valium bid  . Arthritis    "hips, knees, hands" (07/07/2013)  . Back pain    occasionally but reason unknown  . BPH (benign prostatic hypertrophy)    takes Proscar daily  . Chronic fatigue   . Complication of anesthesia    Per pt, "Hard to sedate" past last colonoscopy in 2008!  . Depression    takes Cymbalta daily  . Diverticulosis   . Dizziness  due to dehydration  . Hemorrhoids   . Hyperlipidemia    takes Lovastatin daily  . Hypertension   . Insomnia    takes Restoril nightly   . Joint pain    knees/ankles/back pain  . Joint swelling   . Muscle pain    takes Baclofen daily  . Night muscle spasms    takes Valium as needed  . Pneumonia 1971  . Right knee DJD   . Urinary frequency    takes rapaflo daily   . Vitamin B12 deficiency 2009  . Vitamin D deficiency disease    Past Surgical History:  Procedure Laterality Date  . COLONOSCOPY    . JOINT REPLACEMENT     both knees  . KNEE ARTHROSCOPY Bilateral 2012  . PROSTATE BIOPSY  01/2010  . 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 TOTAL KNEE ARTHROPLASTY Right 07/07/2013  . TOTAL KNEE ARTHROPLASTY Right 07/07/2013   Procedure: TOTAL KNEE ARTHROPLASTY- right;  Surgeon: Lorn Junes, MD;  Location: [facility];  Service: Orthopedics;  Laterality: Right;     Current Meds  Medication Sig  . allopurinol (ZYLOPRIM) 300 MG tablet Take 1 tablet (300 mg total) by mouth daily.  Marland Kitchen aspirin EC 81 MG tablet Take 1 tablet (81 mg total) by mouth daily.  Marland Kitchen buPROPion (WELLBUTRIN XL) 150 MG 24 hr tablet Take 2 tablets (300 mg total) by mouth daily.  . Cholecalciferol (VITAMIN D-3) 5000 UNITS TABS Take 5,000 Units by mouth daily.  . diazepam (VALIUM) 10 MG tablet TAKE 1 TABLET BY MOUTH EVERY 12 HOURS AS NEEDED FOR ANXIETY  . DULoxetine (CYMBALTA) 60 MG capsule Take 1 capsule (60 mg total) by mouth 2 (two) times daily.  . fentaNYL (DURAGESIC) 25 MCG/HR patch Place 1 patch (25 mcg total) onto the skin every other day. Please fill on or after 06/03/18  . finasteride (PROSCAR) 5 MG tablet Take 5 mg by mouth daily.  Marland Kitchen gabapentin (NEURONTIN) 100 MG capsule TAKE 2 CAPSULES(200 MG) BY MOUTH AT BEDTIME  . L-Methylfolate-Algae 15-90.314 MG CAPS TAKE 1 CAPSULE BY MOUTH ONCE DAILY (Patient taking differently: every other day. TAKE 1 CAPSULE BY MOUTH ONCE DAILY)  . lovastatin (MEVACOR) 40 MG tablet Take 1 tablet (40 mg total) by mouth at bedtime.  Marland Kitchen NARCAN 4 MG/0.1ML LIQD nasal spray kit as needed.  . Oxycodone HCl 10 MG TABS Take 0.5-1 tablets (5-10 mg total) by mouth 3 (three) times daily as needed.  . tamsulosin (FLOMAX) 0.4 MG CAPS capsule  Take 1 capsule by mouth daily.  . temazepam (RESTORIL) 30 MG capsule TAKE 1 TO 2 CAPSULES BY MOUTH AT BEDTIME AS NEEDED FOR SLEEP  . VIAGRA 100 MG tablet Take 0.5-1 tablets (50-100 mg total) by mouth daily as needed for erectile dysfunction.  . vitamin B-12 (CYANOCOBALAMIN) 1000 MCG tablet Take 2,000 mcg by mouth daily.   . Vitamin D, Ergocalciferol, (DRISDOL) 1.25 MG (50000 UT) CAPS capsule TAKE 1 CAPSULE BY MOUTH EVERY 7 DAYS     Allergies:   Patient has no known allergies.   Social History   Tobacco Use  . Smoking status: Former Smoker    Packs/day: 1.00    Years: 38.00    Pack years: 38.00    Types: Cigarettes    Last attempt to quit: 10/23/2005    Years since quitting: 12.6  . Smokeless tobacco: Never  Used  . Tobacco comment: quit smoking 2008  Substance Use Topics  . Alcohol use: Yes    Alcohol/week: 14.0 standard drinks    Types: 14 Cans of beer per week    Comment: 03/09/17 "2 beers daily"  . Drug use: No    Comment: quit age 89     Family Hx: The patient's family history includes Atrial fibrillation in his father; Coronary artery disease in his father and another family member; Dementia in his sister; Heart failure in his father; Hypertension in an other family member; Kidney disease in his father; Liver cancer in his mother.  ROS:   Please see the history of present illness.    All other systems reviewed and are negative.   Labs/Other Tests and Data Reviewed:    Recent Labs: No results found for requested labs within last 8760 hours.   Recent Lipid Panel Lab Results  Component Value Date/Time   CHOL 174 06/29/2016 09:36 AM   TRIG 148.0 06/29/2016 09:36 AM   HDL 55.50 06/29/2016 09:36 AM   CHOLHDL 3 06/29/2016 09:36 AM   LDLCALC 89 06/29/2016 09:36 AM    Wt Readings from Last 3 Encounters:  06/25/18 222 lb 12.8 oz (101.1 kg)  04/03/18 225 lb (102.1 kg)  12/31/17 234 lb (106.1 kg)     Objective:    Vital Signs:  BP (!) 134/95   Pulse 81   Ht _0   (1.854 m)   Wt 222 lb 12.8 oz (101.1 kg)   SpO2 95%   BMI 29.39 kg/m    ASSESSMENT & PLAN:    Coronary calcium seen on CT imaging: calcium score 129/55th percentile. No symptoms. Consistent with mild CAD -discussed test results from CT cardiac. We discussed that I would consider this to be mild CAD and would treat accordingly -already on aspirin 81 mg daily, tolerating -tolerating lovastatin, but last LDL 89. Would like to be on high intensity. He was on simvastatin in the past, not sure why stopped but has a long history of myalgias. We will start with rosuvastatin 10 mg, recheck lipids and LFTs, and then increase to high intensity rosuvastatin 20 mg dose if he tolerates -discussed mediterranean diet -Reviewed exercise, encouraged him to do what he is able.  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 No prior imaging. Recommendation is to re-image in 1 year. Discussed red flag warning signs. Monitor BP; reports being anxious when he took his home BP this AM. Revisit at appt, and if elevated would consider carvedilol or ACEi  COVID-19 Education: The signs and symptoms of COVID-19 were discussed with the patient and how to seek care for testing (follow up with PCP or arrange E-visit).  The importance of social distancing was discussed today.  Patient Risk:   After full review of this patient's clinical status, I feel that they are at least moderate risk at this time.  Time:   Today, I have spent 47 minutes with the patient with telehealth technology discussing results of his testing, recommendations for secondary prevention, diet counseling.    Patient Instructions  Medication Instructions:  Stop: Lovastatin 40 mg Start: Rosuvastatin 10 mg daily  If you need a refill on your cardiac medications before your next appointment, please call your pharmacy.   Lab work: None   Testing/Procedures: None  Follow-Up: At [facility], you and your health needs are our priority.  As part of our  continuing mission to provide you with exceptional heart care, we have created designated Provider  Care Teams.  These Care Teams include your primary Cardiologist (physician) and Advanced Practice Providers (APPs -  Physician Assistants and Nurse Practitioners) who all work together to provide you with the care you need, when you need it. You will need a follow up appointment in 4 months.  Please call our office 2 months in advance to schedule this appointment.  You may see Dr. Harrell Gave or one of the following Advanced Practice Providers on your designated Care Team:   Rosaria Ferries, PA-C . Jory Sims, DNP, ANP       Signed, Buford Dresser, MD  06/25/2018 2:05 PM    [place]

## 2018-07-02 ENCOUNTER — Ambulatory Visit (INDEPENDENT_AMBULATORY_CARE_PROVIDER_SITE_OTHER): Payer: Medicare Other | Admitting: Internal Medicine

## 2018-07-02 ENCOUNTER — Encounter: Payer: Self-pay | Admitting: Internal Medicine

## 2018-07-02 DIAGNOSIS — I10 Essential (primary) hypertension: Secondary | ICD-10-CM | POA: Diagnosis not present

## 2018-07-02 DIAGNOSIS — I2510 Atherosclerotic heart disease of native coronary artery without angina pectoris: Secondary | ICD-10-CM

## 2018-07-02 DIAGNOSIS — M5442 Lumbago with sciatica, left side: Secondary | ICD-10-CM | POA: Diagnosis not present

## 2018-07-02 DIAGNOSIS — E559 Vitamin D deficiency, unspecified: Secondary | ICD-10-CM

## 2018-07-02 DIAGNOSIS — F332 Major depressive disorder, recurrent severe without psychotic features: Secondary | ICD-10-CM

## 2018-07-02 DIAGNOSIS — I77810 Thoracic aortic ectasia: Secondary | ICD-10-CM | POA: Diagnosis not present

## 2018-07-02 DIAGNOSIS — M5441 Lumbago with sciatica, right side: Secondary | ICD-10-CM | POA: Diagnosis not present

## 2018-07-02 DIAGNOSIS — G8929 Other chronic pain: Secondary | ICD-10-CM | POA: Diagnosis not present

## 2018-07-02 DIAGNOSIS — I2584 Coronary atherosclerosis due to calcified coronary lesion: Secondary | ICD-10-CM

## 2018-07-02 MED ORDER — FENTANYL 25 MCG/HR TD PT72: 1.0000 | MEDICATED_PATCH | TRANSDERMAL | 0 refills | Status: DC

## 2018-07-02 MED ORDER — OXYCODONE HCL 10 MG PO TABS: 10.0000 mg | ORAL_TABLET | Freq: Three times a day (TID) | ORAL | 0 refills | Status: DC | PRN

## 2018-07-02 MED ORDER — DULOXETINE HCL 60 MG PO CPEP: 60.0000 mg | ORAL_CAPSULE | Freq: Two times a day (BID) | ORAL | 3 refills | Status: DC

## 2018-07-02 MED ORDER — GABAPENTIN 300 MG PO CAPS: 300.0000 mg | ORAL_CAPSULE | Freq: Three times a day (TID) | ORAL | 3 refills | Status: DC

## 2018-07-02 MED ORDER — OXYCODONE HCL 10 MG PO TABS: 5.0000 mg | ORAL_TABLET | Freq: Three times a day (TID) | ORAL | 0 refills | Status: DC | PRN

## 2018-07-02 MED ORDER — BUPROPION HCL ER (XL) 150 MG PO TB24: 300.0000 mg | ORAL_TABLET | Freq: Every day | ORAL | 1 refills | Status: DC

## 2018-07-02 MED ORDER — DIAZEPAM 10 MG PO TABS: 10.0000 mg | ORAL_TABLET | Freq: Two times a day (BID) | ORAL | 3 refills | Status: DC | PRN

## 2018-07-02 NOTE — Assessment & Plan Note (Signed)
Crestor, aspirin

## 2018-07-02 NOTE — Assessment & Plan Note (Signed)
No added salt diet

## 2018-07-02 NOTE — Assessment & Plan Note (Signed)
Follow-up with Dr. Harrell Gave Blood pressure control, Crestor

## 2018-07-02 NOTE — Assessment & Plan Note (Signed)
Duragesic, OxyIR Gabapentin-will increase to 300 mg nightly Diazepam as needed for spasms not to be taken together with opioids

## 2018-07-02 NOTE — Progress Notes (Signed)
Virtual Visit via Telephone Note  I connected with [name] on 07/02/18 at  3:20 PM EDT by telephone and verified that I am speaking with the correct person using two identifiers.   I discussed the limitations, risks, security and privacy concerns of performing an evaluation and management service by telephone and the availability of in person appointments. I also discussed with the patient that there may be a patient responsible charge related to this service. The patient expressed understanding and agreed to proceed.   History of Present Illness:   Follow-up on chronic pain and muscle spasms, coronary artery disease, dyslipidemia, anxiety.  [name] is still recovering from his pneumonia.  Gabapentin at night seem to help him a little bit Observations/Objective:  [name] is in no acute distress Assessment and Plan:  See plan Follow Up Instructions:    I discussed the assessment and treatment plan with the patient. The patient was provided an opportunity to ask questions and all were answered. The patient agreed with the plan and demonstrated an understanding of the instructions.   The patient was advised to call back or seek an in-person evaluation if the symptoms worsen or if the condition fails to improve as anticipated.  I provided 20 minutes of non-face-to-face time during this encounter.   Walker Kehr, MD

## 2018-07-02 NOTE — Assessment & Plan Note (Signed)
Cymbalta, Deplin

## 2018-07-02 NOTE — Assessment & Plan Note (Signed)
On vitamin D by prescription

## 2018-08-14 ENCOUNTER — Other Ambulatory Visit: Payer: Self-pay | Admitting: Internal Medicine

## 2018-08-29 ENCOUNTER — Other Ambulatory Visit: Payer: Self-pay | Admitting: Internal Medicine

## 2018-09-03 ENCOUNTER — Other Ambulatory Visit: Payer: Self-pay | Admitting: Internal Medicine

## 2018-09-03 MED ORDER — GABAPENTIN 300 MG PO CAPS: 300.0000 mg | ORAL_CAPSULE | Freq: Every day | ORAL | 3 refills | Status: DC

## 2018-09-18 ENCOUNTER — Other Ambulatory Visit (INDEPENDENT_AMBULATORY_CARE_PROVIDER_SITE_OTHER): Payer: Medicare Other

## 2018-09-18 DIAGNOSIS — Z0000 Encounter for general adult medical examination without abnormal findings: Secondary | ICD-10-CM

## 2018-09-18 DIAGNOSIS — I10 Essential (primary) hypertension: Secondary | ICD-10-CM

## 2018-09-18 DIAGNOSIS — E559 Vitamin D deficiency, unspecified: Secondary | ICD-10-CM | POA: Diagnosis not present

## 2018-09-18 LAB — CBC WITH DIFFERENTIAL/PLATELET
Basophils Absolute: 0.1 10*3/uL (ref 0.0–0.1)
Basophils Relative: 0.8 % (ref 0.0–3.0)
Eosinophils Absolute: 0.3 10*3/uL (ref 0.0–0.7)
Eosinophils Relative: 4.9 % (ref 0.0–5.0)
HCT: 45.2 % (ref 39.0–52.0)
Hemoglobin: 15.4 g/dL (ref 13.0–17.0)
Lymphocytes Relative: 19.4 % (ref 12.0–46.0)
Lymphs Abs: 1.3 10*3/uL (ref 0.7–4.0)
MCHC: 34.1 g/dL (ref 30.0–36.0)
MCV: 93.9 fl (ref 78.0–100.0)
Monocytes Absolute: 0.5 10*3/uL (ref 0.1–1.0)
Monocytes Relative: 7.1 % (ref 3.0–12.0)
Neutro Abs: 4.7 10*3/uL (ref 1.4–7.7)
Neutrophils Relative %: 67.8 % (ref 43.0–77.0)
Platelets: 225 10*3/uL (ref 150.0–400.0)
RBC: 4.81 Mil/uL (ref 4.22–5.81)
RDW: 13.6 % (ref 11.5–15.5)
WBC: 6.9 10*3/uL (ref 4.0–10.5)

## 2018-09-18 LAB — BASIC METABOLIC PANEL
BUN: 10 mg/dL (ref 6–23)
CO2: 28 mEq/L (ref 19–32)
Calcium: 9.1 mg/dL (ref 8.4–10.5)
Chloride: 104 mEq/L (ref 96–112)
Creatinine, Ser: 0.89 mg/dL (ref 0.40–1.50)
GFR: 85.14 mL/min (ref >60.00)
Glucose, Bld: 116 mg/dL — ABNORMAL HIGH (ref 70–99)
Potassium: 4.2 mEq/L (ref 3.5–5.1)
Sodium: 141 mEq/L (ref 135–145)

## 2018-09-18 LAB — LIPID PANEL
Cholesterol: 180 mg/dL (ref 0–200)
HDL: 48.9 mg/dL (ref >39.00)
LDL Cholesterol: 105 mg/dL — ABNORMAL HIGH (ref 0–99)
NonHDL: 130.77
Total CHOL/HDL Ratio: 4
Triglycerides: 128 mg/dL (ref 0.0–149.0)
VLDL: 25.6 mg/dL (ref 0.0–40.0)

## 2018-09-18 LAB — HEPATIC FUNCTION PANEL
ALT: 27 U/L (ref 0–53)
AST: 21 U/L (ref 0–37)
Albumin: 4.3 g/dL (ref 3.5–5.2)
Alkaline Phosphatase: 71 U/L (ref 39–117)
Bilirubin, Direct: 0.2 mg/dL (ref 0.0–0.3)
Total Bilirubin: 0.8 mg/dL (ref 0.2–1.2)
Total Protein: 6.8 g/dL (ref 6.0–8.3)

## 2018-09-18 LAB — TSH: TSH: 1.45 u[IU]/mL (ref 0.35–4.50)

## 2018-09-19 ENCOUNTER — Other Ambulatory Visit (INDEPENDENT_AMBULATORY_CARE_PROVIDER_SITE_OTHER): Payer: Medicare Other

## 2018-09-19 DIAGNOSIS — Z0000 Encounter for general adult medical examination without abnormal findings: Secondary | ICD-10-CM | POA: Diagnosis not present

## 2018-09-19 DIAGNOSIS — E559 Vitamin D deficiency, unspecified: Secondary | ICD-10-CM | POA: Diagnosis not present

## 2018-09-19 DIAGNOSIS — I10 Essential (primary) hypertension: Secondary | ICD-10-CM | POA: Diagnosis not present

## 2018-09-19 LAB — URINALYSIS
Bilirubin Urine: NEGATIVE
Hgb urine dipstick: NEGATIVE
Ketones, ur: NEGATIVE
Leukocytes,Ua: NEGATIVE
Nitrite: NEGATIVE
Specific Gravity, Urine: 1.015 (ref 1.000–1.030)
Total Protein, Urine: NEGATIVE
Urine Glucose: NEGATIVE
Urobilinogen, UA: 0.2 (ref 0.0–1.0)
pH: 5.5 (ref 5.0–8.0)

## 2018-09-26 ENCOUNTER — Encounter: Payer: Self-pay | Admitting: Internal Medicine

## 2018-09-26 ENCOUNTER — Other Ambulatory Visit (INDEPENDENT_AMBULATORY_CARE_PROVIDER_SITE_OTHER): Payer: Medicare Other

## 2018-09-26 ENCOUNTER — Other Ambulatory Visit: Payer: Self-pay

## 2018-09-26 ENCOUNTER — Ambulatory Visit (INDEPENDENT_AMBULATORY_CARE_PROVIDER_SITE_OTHER): Payer: Medicare Other | Admitting: Internal Medicine

## 2018-09-26 VITALS — BP 128/84 | HR 70 | Temp 98.2°F | Ht 73.0 in | Wt 229.0 lb

## 2018-09-26 DIAGNOSIS — I77810 Thoracic aortic ectasia: Secondary | ICD-10-CM | POA: Diagnosis not present

## 2018-09-26 DIAGNOSIS — I2510 Atherosclerotic heart disease of native coronary artery without angina pectoris: Secondary | ICD-10-CM

## 2018-09-26 DIAGNOSIS — I2584 Coronary atherosclerosis due to calcified coronary lesion: Secondary | ICD-10-CM | POA: Diagnosis not present

## 2018-09-26 DIAGNOSIS — E785 Hyperlipidemia, unspecified: Secondary | ICD-10-CM

## 2018-09-26 DIAGNOSIS — I10 Essential (primary) hypertension: Secondary | ICD-10-CM

## 2018-09-26 DIAGNOSIS — E559 Vitamin D deficiency, unspecified: Secondary | ICD-10-CM

## 2018-09-26 DIAGNOSIS — E538 Deficiency of other specified B group vitamins: Secondary | ICD-10-CM | POA: Diagnosis not present

## 2018-09-26 DIAGNOSIS — R739 Hyperglycemia, unspecified: Secondary | ICD-10-CM | POA: Diagnosis not present

## 2018-09-26 LAB — BASIC METABOLIC PANEL
BUN: 7 mg/dL (ref 6–23)
CO2: 26 mEq/L (ref 19–32)
Calcium: 9.2 mg/dL (ref 8.4–10.5)
Chloride: 103 mEq/L (ref 96–112)
Creatinine, Ser: 0.91 mg/dL (ref 0.40–1.50)
GFR: 82.98 mL/min (ref >60.00)
Glucose, Bld: 86 mg/dL (ref 70–99)
Potassium: 4.2 mEq/L (ref 3.5–5.1)
Sodium: 139 mEq/L (ref 135–145)

## 2018-09-26 LAB — VITAMIN D 25 HYDROXY (VIT D DEFICIENCY, FRACTURES): VITD: 44.81 ng/mL (ref 30.00–100.00)

## 2018-09-26 LAB — HEMOGLOBIN A1C: Hgb A1c MFr Bld: 5.5 % (ref 4.6–6.5)

## 2018-09-26 MED ORDER — OXYCODONE HCL 10 MG PO TABS: 10.0000 mg | ORAL_TABLET | Freq: Three times a day (TID) | ORAL | 0 refills | Status: DC | PRN

## 2018-09-26 MED ORDER — FENTANYL 25 MCG/HR TD PT72: 1.0000 | MEDICATED_PATCH | TRANSDERMAL | 0 refills | Status: DC

## 2018-09-26 MED ORDER — OXYCODONE HCL 10 MG PO TABS: 5.0000 mg | ORAL_TABLET | Freq: Three times a day (TID) | ORAL | 0 refills | Status: DC | PRN

## 2018-09-26 MED ORDER — LOVASTATIN 40 MG PO TABS: 40.0000 mg | ORAL_TABLET | Freq: Every day | ORAL | 3 refills | Status: DC

## 2018-09-26 MED ORDER — DIAZEPAM 10 MG PO TABS: 10.0000 mg | ORAL_TABLET | Freq: Two times a day (BID) | ORAL | 3 refills | Status: DC | PRN

## 2018-09-26 MED ORDER — ROSUVASTATIN CALCIUM 10 MG PO TABS: 10.0000 mg | ORAL_TABLET | Freq: Every day | ORAL | 11 refills | Status: DC

## 2018-09-26 NOTE — Assessment & Plan Note (Signed)
F/u w/Dr. Larence Penning - d/c, Lovastatin, aspirin

## 2018-09-26 NOTE — Assessment & Plan Note (Signed)
CT Ca score 142 Dr. Harrell Gave Crestor - d/c, Lovastatin, aspirin

## 2018-09-26 NOTE — Assessment & Plan Note (Signed)
On B12 

## 2018-09-26 NOTE — Progress Notes (Signed)
Subjective:  Patient ID: Lee Owens, male    DOB: 1950-07-11  Age: 68 y.o. MRN: [id number]  CC: No chief complaint on file.   HPI Lee Owens presents for chronic pain, spasms, gout, depression  Outpatient Medications Prior to Visit  Medication Sig Dispense Refill   allopurinol (ZYLOPRIM) 300 MG tablet Take 1 tablet (300 mg total) by mouth daily. 30 tablet 11   aspirin EC 81 MG tablet Take 1 tablet (81 mg total) by mouth daily. 100 tablet 3   buPROPion (WELLBUTRIN XL) 150 MG 24 hr tablet TAKE 2 TABLETS(300 MG) BY MOUTH DAILY 180 tablet 1   Cholecalciferol (VITAMIN D-3) 5000 UNITS TABS Take 5,000 Units by mouth daily.     diazepam (VALIUM) 10 MG tablet Take 1 tablet (10 mg total) by mouth every 12 (twelve) hours as needed. 60 tablet 3   DULoxetine (CYMBALTA) 60 MG capsule Take 1 capsule (60 mg total) by mouth 2 (two) times daily. 180 capsule 3   fentaNYL (DURAGESIC) 25 MCG/HR Place 1 patch onto the skin every other day. Please fill on or after 07/02/18 15 patch 0   fentaNYL (DURAGESIC) 25 MCG/HR Place 1 patch onto the skin every other day. 15 patch 0   fentaNYL (DURAGESIC) 25 MCG/HR Place 1 patch onto the skin every other day. 15 patch 0   finasteride (PROSCAR) 5 MG tablet Take 5 mg by mouth daily.     gabapentin (NEURONTIN) 300 MG capsule Take 1 capsule (300 mg total) by mouth at bedtime. 90 capsule 3   L-Methylfolate-Algae 15-90.314 MG CAPS TAKE 1 CAPSULE BY MOUTH ONCE DAILY (Patient taking differently: every other day. TAKE 1 CAPSULE BY MOUTH ONCE DAILY) 30 capsule 11   NARCAN 4 MG/0.1ML LIQD nasal spray kit as needed.  0   Oxycodone HCl 10 MG TABS Take 0.5-1 tablets (5-10 mg total) by mouth 3 (three) times daily as needed. 90 each 0   Oxycodone HCl 10 MG TABS Take 1 tablet (10 mg total) by mouth 3 (three) times daily as needed. 90 tablet 0   Oxycodone HCl 10 MG TABS Take 1 tablet (10 mg total) by mouth every 8 (eight) hours as needed. 90 tablet 0   tamsulosin  (FLOMAX) 0.4 MG CAPS capsule Take 1 capsule by mouth daily.  2   temazepam (RESTORIL) 30 MG capsule TAKE 1 TO 2 CAPSULES BY MOUTH AT BEDTIME AS NEEDED FOR SLEEP 30 capsule 3   vitamin B-12 (CYANOCOBALAMIN) 1000 MCG tablet Take 2,000 mcg by mouth daily.      Vitamin D, Ergocalciferol, (DRISDOL) 1.25 MG (50000 UT) CAPS capsule TAKE 1 CAPSULE BY MOUTH EVERY 7 DAYS 12 capsule 0   rosuvastatin (CRESTOR) 10 MG tablet Take 1 tablet (10 mg total) by mouth daily. 30 tablet 11   VIAGRA 100 MG tablet Take 0.5-1 tablets (50-100 mg total) by mouth daily as needed for erectile dysfunction. 5 tablet 5   No facility-administered medications prior to visit.     ROS: Review of Systems  Constitutional: Positive for fatigue. Negative for appetite change and unexpected weight change.  HENT: Negative for congestion, nosebleeds, sneezing, sore throat and trouble swallowing.   Eyes: Negative for itching and visual disturbance.  Respiratory: Negative for cough.   Cardiovascular: Negative for chest pain, palpitations and leg swelling.  Gastrointestinal: Negative for abdominal distention, blood in stool, diarrhea and nausea.  Genitourinary: Negative for frequency and hematuria.  Musculoskeletal: Positive for arthralgias, back pain, gait problem, myalgias, neck pain and neck stiffness.  Negative for joint swelling.  Skin: Negative for rash.  Neurological: Negative for dizziness, tremors, speech difficulty and weakness.  Psychiatric/Behavioral: Positive for dysphoric mood and sleep disturbance. Negative for agitation and suicidal ideas. The patient is nervous/anxious.     Objective:  BP 128/84 (BP Location: Left Arm, Patient Position: Sitting, Cuff Size: Large)    Pulse 70    Temp 98.2 F (36.8 C) (Oral)    Ht _0  (1.854 m)    Wt 229 lb (103.9 kg)    SpO2 95%    BMI 30.21 kg/m   BP Readings from Last 3 Encounters:  09/26/18 128/84  06/25/18 (!) 134/95  04/03/18 (!) 142/90    Wt Readings from Last 3  Encounters:  09/26/18 229 lb (103.9 kg)  06/25/18 222 lb 12.8 oz (101.1 kg)  04/03/18 225 lb (102.1 kg)    Physical Exam Constitutional:      General: He is not in acute distress.    Appearance: He is well-developed.     Comments: NAD  Eyes:     Conjunctiva/sclera: Conjunctivae normal.     Pupils: Pupils are equal, round, and reactive to light.  Neck:     Musculoskeletal: Normal range of motion.     Thyroid: No thyromegaly.     Vascular: No JVD.  Cardiovascular:     Rate and Rhythm: Normal rate and regular rhythm.     Heart sounds: Normal heart sounds. No murmur. No friction rub. No gallop.   Pulmonary:     Effort: Pulmonary effort is normal. No respiratory distress.     Breath sounds: Normal breath sounds. No wheezing or rales.  Chest:     Chest wall: No tenderness.  Abdominal:     General: Bowel sounds are normal. There is no distension.     Palpations: Abdomen is soft. There is no mass.     Tenderness: There is no abdominal tenderness. There is no guarding or rebound.  Musculoskeletal: Normal range of motion.        General: Tenderness present.  Lymphadenopathy:     Cervical: No cervical adenopathy.  Skin:    General: Skin is warm and dry.     Findings: No rash.  Neurological:     Mental Status: He is alert and oriented to person, place, and time.     Cranial Nerves: No cranial nerve deficit.     Motor: No abnormal muscle tone.     Coordination: Coordination normal.     Gait: Gait normal.     Deep Tendon Reflexes: Reflexes are normal and symmetric.  Psychiatric:        Behavior: Behavior normal.        Thought Content: Thought content normal.        Judgment: Judgment normal.   LS pain, neck pain stiff  Lab Results  Component Value Date   WBC 6.9 09/18/2018   HGB 15.4 09/18/2018   HCT 45.2 09/18/2018   PLT 225.0 09/18/2018   GLUCOSE 116 (H) 09/18/2018   CHOL 180 09/18/2018   TRIG 128.0 09/18/2018   HDL 48.90 09/18/2018   LDLCALC 105 (H) 09/18/2018    ALT 27 09/18/2018   AST 21 09/18/2018   NA 141 09/18/2018   K 4.2 09/18/2018   CL 104 09/18/2018   CREATININE 0.89 09/18/2018   BUN 10 09/18/2018   CO2 28 09/18/2018   TSH 1.45 09/18/2018   PSA 0.76 06/29/2016   INR 0.96 06/30/2013   HGBA1C 5.5 04/06/2017  Ct Cardiac Scoring  Addendum Date: 05/22/2018   ADDENDUM REPORT: 05/22/2018 09:06 EXAM: OVER-READ INTERPRETATION  CT CHEST The following report is an over-read performed by radiologist Dr. Samara Snide [facility], [place] on 05/22/2018. This over-read does not include interpretation of cardiac or coronary anatomy or pathology. The coronary calcium score interpretation by the cardiologist is attached. COMPARISON:  03/22/2018 chest radiograph. FINDINGS: Motion degraded scan, limiting assessment. Cardiovascular: No significant pericardial effusion/thickening. The visualized portion of the ascending thoracic aorta is ectatic with maximum diameter 4.4 cm. Main pulmonary artery is normal caliber. Mediastinum/Nodes: Unremarkable esophagus. No pathologically enlarged mediastinal or hilar lymph nodes, noting limited sensitivity for the detection of hilar adenopathy on this noncontrast study. Lungs/Pleura: No pneumothorax. No pleural effusion. No acute consolidative airspace disease, lung masses or significant pulmonary nodules. Upper abdomen: No acute abnormality. Musculoskeletal: No aggressive appearing focal osseous lesions. Moderate thoracic spondylosis. IMPRESSION: 1. Ectatic 4.4 cm ascending thoracic aorta. Recommend annual imaging followup by CTA or MRA. This recommendation follows 2010 ACCF/AHA/AATS/ACR/ASA/SCA/SCAI/SIR/STS/SVM Guidelines for the Diagnosis and Management of Patients with Thoracic Aortic Disease. Circulation. 2010; 121: O643-X427. Aortic aneurysm NOS (ICD10-I71.9). 2. No additional significant extracardiac findings on this motion degraded scan. Electronically Signed   By: Ilona Sorrel M.D.   On: 05/22/2018 09:06   Result Date:  05/22/2018 CLINICAL DATA:  Risk stratification EXAM: Coronary Calcium Score TECHNIQUE: The patient was scanned on a Siemens Somatom 64 slice scanner. Axial non-contrast 27m slices were carried out through the heart. The data set was analyzed on a dedicated work station and scored using the [place] FINDINGS: Non-cardiac: No significant non cardiac findings on limited lung and soft tissue windows. [facility]. Ascending Aorta: Moderately dilated aortic root 4.3 cm Pericardium: Normal Coronary arteries: Calcium noted in LM and LAD IMPRESSION: Coronary calcium score of 142. This was 543th percentile for age and sex matched control Moderately dilated aortic root 4.3 cm consider f/u CTA to further evaluate . PJenkins RougeElectronically Signed: By: PJenkins RougeM.D. On: 05/21/2018 13:53    Assessment & Plan:   There are no diagnoses linked to this encounter.   No orders of the defined types were placed in this encounter.    Follow-up: No follow-ups on file.  AWalker Kehr MD

## 2018-09-26 NOTE — Assessment & Plan Note (Signed)
A1c

## 2018-09-26 NOTE — Assessment & Plan Note (Signed)
Vit D Labs 

## 2018-09-26 NOTE — Assessment & Plan Note (Signed)
Off BP meds 

## 2018-10-28 ENCOUNTER — Telehealth: Payer: Self-pay | Admitting: Cardiology

## 2018-10-28 ENCOUNTER — Ambulatory Visit (INDEPENDENT_AMBULATORY_CARE_PROVIDER_SITE_OTHER): Payer: Medicare Other | Admitting: Cardiology

## 2018-10-28 ENCOUNTER — Encounter: Payer: Self-pay | Admitting: Cardiology

## 2018-10-28 ENCOUNTER — Other Ambulatory Visit: Payer: Self-pay

## 2018-10-28 VITALS — BP 130/84 | HR 86 | Temp 97.9°F | Ht 73.0 in | Wt 232.8 lb

## 2018-10-28 DIAGNOSIS — Z7189 Other specified counseling: Secondary | ICD-10-CM

## 2018-10-28 DIAGNOSIS — I712 Thoracic aortic aneurysm, without rupture, unspecified: Secondary | ICD-10-CM

## 2018-10-28 DIAGNOSIS — I2584 Coronary atherosclerosis due to calcified coronary lesion: Secondary | ICD-10-CM

## 2018-10-28 DIAGNOSIS — I10 Essential (primary) hypertension: Secondary | ICD-10-CM | POA: Diagnosis not present

## 2018-10-28 DIAGNOSIS — I2510 Atherosclerotic heart disease of native coronary artery without angina pectoris: Secondary | ICD-10-CM | POA: Diagnosis not present

## 2018-10-28 DIAGNOSIS — Z79899 Other long term (current) drug therapy: Secondary | ICD-10-CM

## 2018-10-28 NOTE — Progress Notes (Signed)
Cardiology Office Note:    Date:  10/28/2018   ID:  Lee Owens, DOB 07-18-1950, MRN [id number]  PCP:  Cassandria Anger, MD  Cardiologist:  Buford Dresser, MD PhD  Referring MD: Cassandria Anger, MD   CC: follow up  History of Present Illness:    Lee Owens is a 68 y.o. male with a hx of hypertension who was initially seen via telemedicine visit 06/25/18. He was referred after calcium score was done by Dr. Alain Marion.   At that time, he was started on rosuvastatin 10 mg. He was changed back to lovastatin at his visit with Dr. Alain Marion 09/26/18. He states muscle pain was severe on rosuvastatin, returned to lovastatin. Now tolerating again without issue. Reviewed history, he is unsure of all of the statins he has tried (documentation of simvastatin, unsure about atorvastatin). His wife is on the phone via speakerphone during our visit for assistance re: his medical history.  Denies chest pain, shortness of breath at rest or with normal exertion. No PND, orthopnea, LE edema or unexpected weight gain. No syncope or palpitations. Does have chronic dizzyness, better since having BP meds adjusted.  Has history of high blood pressure, was taken off BP meds due to lightheadedness. Doesn't check routinely at home. Was initially elevated here today but improved on recheck.   Past Medical History:  Diagnosis Date  . Allergic rhinitis   . Anxiety    takes Valium bid  . Arthritis    "hips, knees, hands" (07/07/2013)  . Back pain    occasionally but reason unknown  . BPH (benign prostatic hypertrophy)    takes Proscar daily  . Chronic fatigue   . Complication of anesthesia    Per pt, "Hard to sedate" past last colonoscopy in 2008!  . Depression    takes Cymbalta daily  . Diverticulosis   . Dizziness    due to dehydration  . Hemorrhoids   . Hyperlipidemia    takes Lovastatin daily  . Hypertension   . Insomnia    takes Restoril nightly   . Joint pain    knees/ankles/back pain  . Joint swelling   . Muscle pain    takes Baclofen daily  . Night muscle spasms    takes Valium as needed  . Pneumonia 1971  . Right knee DJD   . Urinary frequency    takes rapaflo daily  . Vitamin B12 deficiency 2009  . Vitamin D deficiency disease     Past Surgical History:  Procedure Laterality Date  . COLONOSCOPY    . JOINT REPLACEMENT     both knees  . KNEE ARTHROSCOPY Bilateral 2012  . PROSTATE BIOPSY  01/2010  . 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 TOTAL KNEE ARTHROPLASTY Right 07/07/2013  . TOTAL KNEE ARTHROPLASTY Right 07/07/2013   Procedure: TOTAL KNEE ARTHROPLASTY- right;  Surgeon: Lorn Junes, MD;  Location: [facility];  Service: Orthopedics;  Laterality: Right;    Current Medications: Current Outpatient Medications on File Prior to Visit  Medication Sig  . allopurinol (ZYLOPRIM) 300 MG tablet Take 1 tablet (300 mg total) by mouth daily.  Marland Kitchen aspirin EC 81 MG tablet Take 1 tablet (81 mg total) by mouth daily.  Marland Kitchen buPROPion (WELLBUTRIN XL) 150 MG 24 hr tablet TAKE 2 TABLETS(300 MG) BY MOUTH DAILY  . Cholecalciferol (VITAMIN D-3) 5000 UNITS TABS Take  5,000 Units by mouth daily.  . diazepam (VALIUM) 10 MG tablet Take 1 tablet (10 mg total) by mouth every 12 (twelve) hours as needed.  . DULoxetine (CYMBALTA) 60 MG capsule Take 1 capsule (60 mg total) by mouth 2 (two) times daily.  . fentaNYL (DURAGESIC) 25 MCG/HR Place 1 patch onto the skin every other day.  . finasteride (PROSCAR) 5 MG tablet Take 5 mg by mouth daily.  Marland Kitchen gabapentin (NEURONTIN) 300 MG capsule Take 1 capsule (300 mg total) by mouth at bedtime.  Marland Kitchen L-Methylfolate-Algae 15-90.314 MG CAPS TAKE 1 CAPSULE BY MOUTH ONCE DAILY (Patient taking differently: every other day. TAKE 1 CAPSULE BY MOUTH ONCE DAILY)  . lovastatin (MEVACOR) 40 MG tablet Take 1  tablet (40 mg total) by mouth daily.  Marland Kitchen NARCAN 4 MG/0.1ML LIQD nasal spray kit as needed.  . Oxycodone HCl 10 MG TABS Take 0.5-1 tablets (5-10 mg total) by mouth 3 (three) times daily as needed.  . tamsulosin (FLOMAX) 0.4 MG CAPS capsule Take 1 capsule by mouth daily.  . temazepam (RESTORIL) 30 MG capsule TAKE 1 TO 2 CAPSULES BY MOUTH AT BEDTIME AS NEEDED FOR SLEEP  . vitamin B-12 (CYANOCOBALAMIN) 1000 MCG tablet Take 2,000 mcg by mouth daily.   . Vitamin D, Ergocalciferol, (DRISDOL) 1.25 MG (50000 UT) CAPS capsule TAKE 1 CAPSULE BY MOUTH EVERY 7 DAYS   No current facility-administered medications on file prior to visit.      Allergies:   Crestor [rosuvastatin calcium]   Social History   Socioeconomic History  . Marital status: Married    Spouse name: ary  . Number of children: Not on file  . Years of education: Not on file  . Highest education level: Not on file  Occupational History  . Occupation: Primary school teacher: SELF-EMPLOYED  Social Needs  . Financial resource strain: Not on file  . Food insecurity    Worry: Not on file    Inability: Not on file  . Transportation needs    Medical: Not on file    Non-medical: Not on file  Tobacco Use  . Smoking status: Former Smoker    Packs/day: 1.00    Years: 38.00    Pack years: 38.00    Types: Cigarettes    Quit date: 10/23/2005    Years since quitting: 13.0  . Smokeless tobacco: Never Used  . Tobacco comment: quit smoking 2008  Substance and Sexual Activity  . Alcohol use: Yes    Alcohol/week: 14.0 standard drinks    Types: 14 Cans of beer per week    Comment: 03/09/17 "2 beers daily"  . Drug use: No    Comment: quit age 69  . Sexual activity: Not Currently  Lifestyle  . Physical activity    Days per week: Not on file    Minutes per session: Not on file  . Stress: Not on file  Relationships  . Social Herbalist on phone: Not on file    Gets together: Not on file    Attends religious  service: Not on file    Active member of club or organization: Not on file    Attends meetings of clubs or organizations: Not on file    Relationship status: Not on file  Other Topics Concern  . Not on file  Social History Narrative   Live with wife, MAry   Education: some college   Retired   No children   Caffeine- 3 daily  Family History: The patient's family history includes Atrial fibrillation in his father; Coronary artery disease in his father and another family member; Dementia in his sister; Heart failure in his father; Hypertension in an other family member; Kidney disease in his father; Liver cancer in his mother.  ROS:   Please see the history of present illness.  Additional pertinent ROS: Constitutional: Negative for chills, fever, night sweats, unintentional weight loss  HENT: Negative for ear pain and hearing loss.   Eyes: Negative for loss of vision and eye pain.  Respiratory: Negative for cough, sputum, wheezing.   Cardiovascular: See HPI. Gastrointestinal: Negative for abdominal pain, melena, and hematochezia.  Genitourinary: Negative for dysuria and hematuria.  Musculoskeletal: Negative for falls and myalgias.  Skin: Negative for itching and rash.  Neurological: Negative for focal weakness, focal sensory changes and loss of consciousness.  Endo/Heme/Allergies: Does not bruise/bleed easily.     EKGs/Labs/Other Studies Reviewed:    The following studies were reviewed today: CT calcium  EKG:  EKG is personally reviewed.  The ekg ordered 05/05/2013 demonstrates NSR  Recent Labs: 09/18/2018: ALT 27; Hemoglobin 15.4; Platelets 225.0; TSH 1.45 09/26/2018: BUN 7; Creatinine, Ser 0.91; Potassium 4.2; Sodium 139  Recent Lipid Panel    Component Value Date/Time   CHOL 180 09/18/2018 1038   TRIG 128.0 09/18/2018 1038   HDL 48.90 09/18/2018 1038   CHOLHDL 4 09/18/2018 1038   VLDL 25.6 09/18/2018 1038   LDLCALC 105 (H) 09/18/2018 1038    Physical Exam:    VS:   BP 130/84   Pulse 86   Temp 97.9 F (36.6 C)   Ht 6' 1"  (1.854 m)   Wt 232 lb 12.8 oz (105.6 kg)   BMI 30.71 kg/m     Wt Readings from Last 3 Encounters:  10/28/18 232 lb 12.8 oz (105.6 kg)  09/26/18 229 lb (103.9 kg)  06/25/18 222 lb 12.8 oz (101.1 kg)     GEN: Well nourished, well developed in no acute distress HEENT: Normal, moist mucous membranes NECK: No JVD CARDIAC: regular rhythm, normal S1 and S2, no murmurs, rubs, gallops.  VASCULAR: Radial and DP pulses 2+ bilaterally. No carotid bruits RESPIRATORY:  Clear to auscultation without rales, wheezing or rhonchi  ABDOMEN: Soft, non-tender, non-distended MUSCULOSKELETAL:  Ambulates independently SKIN: Warm and dry, no edema NEUROLOGIC:  Alert and oriented x 3. No focal neuro deficits noted. PSYCHIATRIC:  Normal affect    ASSESSMENT:    1. Coronary artery disease due to calcified coronary lesion   2. Thoracic aortic aneurysm without rupture ([place])   3. Medication management   4. Essential hypertension   5. Cardiac risk counseling   6. Counseling on health promotion and disease prevention    PLAN:    Coronary calcium seen on CT imaging: calcium score 129/55th percentile. No symptoms.  -this is consistent with mild CAD and would treat accordingly -already on aspirin 81 mg daily, tolerating -did not tolerate rosuvastatin. Has not tolerated simvastatin in the past, not sure about atorvastatin. Back on lovastatin for about the last month. -discussed CV risk management, below -recheck lipids  Thoracic aortic ectasia: 4.4 cm. No prior imaging. Repeat 04/2019. Will need BMET prior  Hypertension: elevated initially today, then BP in range. If elevates and remains >130/80, would start either ACEi or carvedilol given his thoracic aorta size.  Cardiac risk counseling and prevention recommendations: -recommend heart healthy/Mediterranean diet, with whole grains, fruits, vegetable, fish, lean meats, nuts, and olive oil. Limit  salt. -recommend moderate  walking, 3-5 times/week for 30-50 minutes each session. Aim for at least 150 minutes.week. Goal should be pace of 3 miles/hours, or walking 1.5 miles in 30 minutes -recommend avoidance of tobacco products. Avoid excess alcohol.  Plan for follow up: 6 mos, repeat CT angio 04/2019  Medication Adjustments/Labs and Tests Ordered: Current medicines are reviewed at length with the patient today.  Concerns regarding medicines are outlined above.  Orders Placed This Encounter  Procedures  . CT ANGIO CHEST AORTA W &/OR WO CONTRAST  . Lipid panel  . Basic metabolic panel   No orders of the defined types were placed in this encounter.   Patient Instructions  Medication Instructions:  Your Physician recommend you continue on your current medication as directed.    If you need a refill on your cardiac medications before your next appointment, please call your pharmacy.   Lab work: Your physician recommends that you return for lab work 1 week prior to test (BMP, Lipid).  If you have labs (blood work) drawn today and your tests are completely normal, you will receive your results only by: Marland Kitchen MyChart Message (if you have MyChart) OR . A paper copy in the mail If you have any lab test that is abnormal or we need to change your treatment, we will call you to review the results.  Testing/Procedures: Non-Cardiac CT Angiography (CTA), is a special type of CT scan that uses a computer to produce multi-dimensional views of major blood vessels throughout the body. In CT angiography, a contrast material is injected through an IV to help visualize the blood vessels Zacarias Pontes  Follow-Up: At [facility], you and your health needs are our priority.  As part of our continuing mission to provide you with exceptional heart care, we have created designated Provider Care Teams.  These Care Teams include your primary Cardiologist (physician) and Advanced Practice Providers (APPs -   Physician Assistants and Nurse Practitioners) who all work together to provide you with the care you need, when you need it. You will need a follow up appointment in 6 months.  Please call our office 2 months in advance to schedule this appointment.  You may see Dr. Harrell Gave or one of the following Advanced Practice Providers on your designated Care Team:   Rosaria Ferries, PA-C . Jory Sims, DNP, ANP        Signed, Buford Dresser, MD PhD 10/28/2018  [place]

## 2018-10-28 NOTE — Patient Instructions (Signed)
Medication Instructions:  Your Physician recommend you continue on your current medication as directed.    If you need a refill on your cardiac medications before your next appointment, please call your pharmacy.   Lab work: Your physician recommends that you return for lab work 1 week prior to test (BMP, Lipid).  If you have labs (blood work) drawn today and your tests are completely normal, you will receive your results only by: Marland Kitchen MyChart Message (if you have MyChart) OR . A paper copy in the mail If you have any lab test that is abnormal or we need to change your treatment, we will call you to review the results.  Testing/Procedures: Non-Cardiac CT Angiography (CTA), is a special type of CT scan that uses a computer to produce multi-dimensional views of major blood vessels throughout the body. In CT angiography, a contrast material is injected through an IV to help visualize the blood vessels Zacarias Pontes  Follow-Up: At [facility], you and your health needs are our priority.  As part of our continuing mission to provide you with exceptional heart care, we have created designated Provider Care Teams.  These Care Teams include your primary Cardiologist (physician) and Advanced Practice Providers (APPs -  Physician Assistants and Nurse Practitioners) who all work together to provide you with the care you need, when you need it. You will need a follow up appointment in 6 months.  Please call our office 2 months in advance to schedule this appointment.  You may see Dr. Harrell Gave or one of the following Advanced Practice Providers on your designated Care Team:   Rosaria Ferries, PA-C . Jory Sims, DNP, ANP

## 2018-10-28 NOTE — Telephone Encounter (Signed)
Spoke with pt who stated he will be coming to office for appt.

## 2018-10-31 ENCOUNTER — Encounter: Payer: Self-pay | Admitting: Cardiology

## 2018-11-20 DIAGNOSIS — R972 Elevated prostate specific antigen [PSA]: Secondary | ICD-10-CM | POA: Diagnosis not present

## 2018-11-20 DIAGNOSIS — N401 Enlarged prostate with lower urinary tract symptoms: Secondary | ICD-10-CM | POA: Diagnosis not present

## 2018-11-20 DIAGNOSIS — R3914 Feeling of incomplete bladder emptying: Secondary | ICD-10-CM | POA: Diagnosis not present

## 2018-11-20 DIAGNOSIS — Z125 Encounter for screening for malignant neoplasm of prostate: Secondary | ICD-10-CM | POA: Diagnosis not present

## 2018-11-20 DIAGNOSIS — N5201 Erectile dysfunction due to arterial insufficiency: Secondary | ICD-10-CM | POA: Diagnosis not present

## 2018-11-21 ENCOUNTER — Other Ambulatory Visit: Payer: Self-pay | Admitting: Internal Medicine

## 2018-11-21 NOTE — Telephone Encounter (Signed)
09/26/18 Vit D labs were normal. Does he needed to continue Vit D rx?

## 2018-11-25 DIAGNOSIS — R3914 Feeling of incomplete bladder emptying: Secondary | ICD-10-CM | POA: Diagnosis not present

## 2018-11-25 DIAGNOSIS — R972 Elevated prostate specific antigen [PSA]: Secondary | ICD-10-CM | POA: Diagnosis not present

## 2018-11-25 DIAGNOSIS — N401 Enlarged prostate with lower urinary tract symptoms: Secondary | ICD-10-CM | POA: Diagnosis not present

## 2018-11-25 DIAGNOSIS — N5201 Erectile dysfunction due to arterial insufficiency: Secondary | ICD-10-CM | POA: Diagnosis not present

## 2018-12-17 ENCOUNTER — Other Ambulatory Visit: Payer: Self-pay | Admitting: Internal Medicine

## 2018-12-26 ENCOUNTER — Ambulatory Visit (INDEPENDENT_AMBULATORY_CARE_PROVIDER_SITE_OTHER): Payer: Medicare Other | Admitting: Internal Medicine

## 2018-12-26 ENCOUNTER — Other Ambulatory Visit: Payer: Self-pay

## 2018-12-26 ENCOUNTER — Encounter: Payer: Self-pay | Admitting: Internal Medicine

## 2018-12-26 VITALS — BP 130/82 | HR 84 | Temp 98.4°F | Ht 73.0 in | Wt 228.0 lb

## 2018-12-26 DIAGNOSIS — I2510 Atherosclerotic heart disease of native coronary artery without angina pectoris: Secondary | ICD-10-CM

## 2018-12-26 DIAGNOSIS — M5441 Lumbago with sciatica, right side: Secondary | ICD-10-CM | POA: Diagnosis not present

## 2018-12-26 DIAGNOSIS — M5442 Lumbago with sciatica, left side: Secondary | ICD-10-CM

## 2018-12-26 DIAGNOSIS — E538 Deficiency of other specified B group vitamins: Secondary | ICD-10-CM | POA: Diagnosis not present

## 2018-12-26 DIAGNOSIS — G8929 Other chronic pain: Secondary | ICD-10-CM | POA: Diagnosis not present

## 2018-12-26 DIAGNOSIS — I10 Essential (primary) hypertension: Secondary | ICD-10-CM

## 2018-12-26 DIAGNOSIS — Z23 Encounter for immunization: Secondary | ICD-10-CM

## 2018-12-26 DIAGNOSIS — E559 Vitamin D deficiency, unspecified: Secondary | ICD-10-CM | POA: Diagnosis not present

## 2018-12-26 MED ORDER — ALPRAZOLAM 1 MG PO TABS: 1.0000 mg | ORAL_TABLET | Freq: Three times a day (TID) | ORAL | 3 refills | Status: DC | PRN

## 2018-12-26 MED ORDER — FENTANYL 25 MCG/HR TD PT72: 1.0000 | MEDICATED_PATCH | TRANSDERMAL | 0 refills | Status: DC

## 2018-12-26 MED ORDER — OXYCODONE HCL 10 MG PO TABS: 10.0000 mg | ORAL_TABLET | Freq: Three times a day (TID) | ORAL | 0 refills | Status: DC | PRN

## 2018-12-26 NOTE — Assessment & Plan Note (Signed)
Losartan 50 mg/d

## 2018-12-26 NOTE — Assessment & Plan Note (Signed)
Duragesic, Oxy IR  Potential benefits of a long term opioids use as well as potential risks (i.e. addiction risk, apnea etc) and complications (i.e. Somnolence, constipation and others) were explained to the patient and were aknowledged. Diazepam for muscle spasms.  Not to be taken together with opioids Water exercises, dry needling, PT Narcan prescription 

## 2018-12-26 NOTE — Assessment & Plan Note (Signed)
Refractory - weekly Vit d 50000 iu 

## 2018-12-26 NOTE — Progress Notes (Signed)
Subjective:  Patient ID: Lee Owens, male    DOB: 06-Dec-1950  Age: 68 y.o. MRN: [id number]  CC: No chief complaint on file.   HPI [name] presents for chronic pain, depression, gout f/u C/o anxiety - worse. On diazepam x 20 years [place] in the yard trying to remove an old cherry tree  Outpatient Medications Prior to Visit  Medication Sig Dispense Refill  . allopurinol (ZYLOPRIM) 300 MG tablet Take 1 tablet (300 mg total) by mouth daily. 30 tablet 11  . aspirin EC 81 MG tablet Take 1 tablet (81 mg total) by mouth daily. 100 tablet 3  . buPROPion (WELLBUTRIN XL) 150 MG 24 hr tablet TAKE 2 TABLETS(300 MG) BY MOUTH DAILY 180 tablet 1  . Cholecalciferol (VITAMIN D-3) 5000 UNITS TABS Take 5,000 Units by mouth daily.    . diazepam (VALIUM) 10 MG tablet Take 1 tablet (10 mg total) by mouth every 12 (twelve) hours as needed. 60 tablet 3  . DULoxetine (CYMBALTA) 60 MG capsule Take 1 capsule (60 mg total) by mouth 2 (two) times daily. 180 capsule 3  . fentaNYL (DURAGESIC) 25 MCG/HR Place 1 patch onto the skin every other day. 15 patch 0  . finasteride (PROSCAR) 5 MG tablet Take 5 mg by mouth daily.    Marland Kitchen gabapentin (NEURONTIN) 300 MG capsule Take 1 capsule (300 mg total) by mouth at bedtime. 90 capsule 3  . L-Methylfolate-Algae 15-90.314 MG CAPS TAKE 1 CAPSULE BY MOUTH ONCE DAILY (Patient taking differently: every other day. TAKE 1 CAPSULE BY MOUTH ONCE DAILY) 30 capsule 11  . lovastatin (MEVACOR) 40 MG tablet Take 1 tablet (40 mg total) by mouth daily. 100 tablet 3  . NARCAN 4 MG/0.1ML LIQD nasal spray kit as needed.  0  . Oxycodone HCl 10 MG TABS Take 0.5-1 tablets (5-10 mg total) by mouth 3 (three) times daily as needed. 90 tablet 0  . tamsulosin (FLOMAX) 0.4 MG CAPS capsule Take 1 capsule by mouth daily.  2  . temazepam (RESTORIL) 30 MG capsule TAKE 1 TO 2 CAPSULES BY MOUTH AT BEDTIME AS NEEDED FOR SLEEP 180 capsule 1  . vitamin B-12 (CYANOCOBALAMIN) 1000 MCG tablet Take 2,000 mcg by  mouth daily.     . Vitamin D, Ergocalciferol, (DRISDOL) 1.25 MG (50000 UT) CAPS capsule TAKE 1 CAPSULE BY MOUTH EVERY 7 DAYS 12 capsule 0   No facility-administered medications prior to visit.     ROS: Review of Systems  Constitutional: Positive for fatigue. Negative for appetite change and unexpected weight change.  HENT: Negative for congestion, nosebleeds, sneezing, sore throat and trouble swallowing.   Eyes: Negative for itching and visual disturbance.  Respiratory: Negative for cough.   Cardiovascular: Negative for chest pain, palpitations and leg swelling.  Gastrointestinal: Negative for abdominal distention, blood in stool, diarrhea and nausea.  Genitourinary: Negative for frequency and hematuria.  Musculoskeletal: Positive for arthralgias, back pain and gait problem. Negative for joint swelling and neck pain.  Skin: Negative for rash.  Neurological: Negative for dizziness, tremors, speech difficulty and weakness.  Psychiatric/Behavioral: Negative for agitation, dysphoric mood, sleep disturbance and suicidal ideas. The patient is not nervous/anxious.     Objective:  BP 130/82 (BP Location: Left Arm, Patient Position: Sitting, Cuff Size: Large)   Pulse 84   Temp 98.4 F (36.9 C) (Oral)   Ht 6' 1"  (1.854 m)   Wt 228 lb (103.4 kg)   SpO2 96%   BMI 30.08 kg/m   BP Readings from Last  3 Encounters:  12/26/18 130/82  10/28/18 130/84  09/26/18 128/84    Wt Readings from Last 3 Encounters:  12/26/18 228 lb (103.4 kg)  10/28/18 232 lb 12.8 oz (105.6 kg)  09/26/18 229 lb (103.9 kg)    Physical Exam Constitutional:      General: He is not in acute distress.    Appearance: He is well-developed.     Comments: NAD  Eyes:     Conjunctiva/sclera: Conjunctivae normal.     Pupils: Pupils are equal, round, and reactive to light.  Neck:     Musculoskeletal: Normal range of motion.     Thyroid: No thyromegaly.     Vascular: No JVD.  Cardiovascular:     Rate and Rhythm:  Normal rate and regular rhythm.     Heart sounds: Normal heart sounds. No murmur. No friction rub. No gallop.   Pulmonary:     Effort: Pulmonary effort is normal. No respiratory distress.     Breath sounds: Normal breath sounds. No wheezing or rales.  Chest:     Chest wall: No tenderness.  Abdominal:     General: Bowel sounds are normal. There is no distension.     Palpations: Abdomen is soft. There is no mass.     Tenderness: There is no abdominal tenderness. There is no guarding or rebound.  Musculoskeletal: Normal range of motion.        General: Tenderness present.  Lymphadenopathy:     Cervical: No cervical adenopathy.  Skin:    General: Skin is warm and dry.     Findings: No rash.  Neurological:     Mental Status: He is alert and oriented to person, place, and time.     Cranial Nerves: No cranial nerve deficit.     Motor: No abnormal muscle tone.     Coordination: Coordination normal.     Gait: Gait abnormal.     Deep Tendon Reflexes: Reflexes are normal and symmetric.  Psychiatric:        Behavior: Behavior normal.        Thought Content: Thought content normal.        Judgment: Judgment normal.     Lab Results  Component Value Date   WBC 6.9 09/18/2018   HGB 15.4 09/18/2018   HCT 45.2 09/18/2018   PLT 225.0 09/18/2018   GLUCOSE 86 09/26/2018   CHOL 180 09/18/2018   TRIG 128.0 09/18/2018   HDL 48.90 09/18/2018   LDLCALC 105 (H) 09/18/2018   ALT 27 09/18/2018   AST 21 09/18/2018   NA 139 09/26/2018   K 4.2 09/26/2018   CL 103 09/26/2018   CREATININE 0.91 09/26/2018   BUN 7 09/26/2018   CO2 26 09/26/2018   TSH 1.45 09/18/2018   PSA 0.76 06/29/2016   INR 0.96 06/30/2013   HGBA1C 5.5 09/26/2018    Ct Cardiac Scoring  Addendum Date: 05/22/2018   ADDENDUM REPORT: 05/22/2018 09:06 EXAM: OVER-READ INTERPRETATION  CT CHEST The following report is an over-read performed by radiologist Dr. Samara Snide [facility] on 05/22/2018. This over-read does  not include interpretation of cardiac or coronary anatomy or pathology. The coronary calcium score interpretation by the cardiologist is attached. COMPARISON:  03/22/2018 chest radiograph. FINDINGS: Motion degraded scan, limiting assessment. Cardiovascular: No significant pericardial effusion/thickening. The visualized portion of the ascending thoracic aorta is ectatic with maximum diameter 4.4 cm. Main pulmonary artery is normal caliber. Mediastinum/Nodes: Unremarkable esophagus. No pathologically enlarged mediastinal or hilar lymph nodes, noting limited  sensitivity for the detection of hilar adenopathy on this noncontrast study. Lungs/Pleura: No pneumothorax. No pleural effusion. No acute consolidative airspace disease, lung masses or significant pulmonary nodules. Upper abdomen: No acute abnormality. Musculoskeletal: No aggressive appearing focal osseous lesions. Moderate thoracic spondylosis. IMPRESSION: 1. Ectatic 4.4 cm ascending thoracic aorta. Recommend annual imaging followup by CTA or MRA. This recommendation follows 2010 ACCF/AHA/AATS/ACR/ASA/SCA/SCAI/SIR/STS/SVM Guidelines for the Diagnosis and Management of Patients with Thoracic Aortic Disease. Circulation. 2010; 121: D664-Q034. Aortic aneurysm NOS (ICD10-I71.9). 2. No additional significant extracardiac findings on this motion degraded scan. Electronically Signed   By: Ilona Sorrel M.D.   On: 05/22/2018 09:06   Result Date: 05/22/2018 CLINICAL DATA:  Risk stratification EXAM: Coronary Calcium Score TECHNIQUE: The patient was scanned on a Siemens Somatom 64 slice scanner. Axial non-contrast 87m slices were carried out through the heart. The data set was analyzed on a dedicated work station and scored using the [place] FINDINGS: Non-cardiac: No significant non cardiac findings on limited lung and soft tissue windows. [facility] Ascending Aorta: Moderately dilated aortic root 4.3 cm Pericardium: Normal  Coronary arteries: Calcium noted in LM and LAD IMPRESSION: Coronary calcium score of 142. This was 553th percentile for age and sex matched control Moderately dilated aortic root 4.3 cm consider f/u CTA to further evaluate . PJenkins RougeElectronically Signed: By: PJenkins RougeM.D. On: 05/21/2018 13:53    Assessment & Plan:   There are no diagnoses linked to this encounter.   No orders of the defined types were placed in this encounter.    Follow-up: No follow-ups on file.  AWalker Kehr MD

## 2018-12-26 NOTE — Assessment & Plan Note (Signed)
On B12 

## 2018-12-27 DIAGNOSIS — Z23 Encounter for immunization: Secondary | ICD-10-CM | POA: Diagnosis not present

## 2019-01-04 ENCOUNTER — Other Ambulatory Visit: Payer: Self-pay | Admitting: Internal Medicine

## 2019-01-13 ENCOUNTER — Other Ambulatory Visit: Payer: Self-pay | Admitting: Internal Medicine

## 2019-02-04 ENCOUNTER — Other Ambulatory Visit: Payer: Self-pay

## 2019-02-04 DIAGNOSIS — I712 Thoracic aortic aneurysm, without rupture, unspecified: Secondary | ICD-10-CM

## 2019-02-25 ENCOUNTER — Other Ambulatory Visit: Payer: Self-pay | Admitting: Internal Medicine

## 2019-03-26 DIAGNOSIS — Z23 Encounter for immunization: Secondary | ICD-10-CM | POA: Diagnosis not present

## 2019-03-27 ENCOUNTER — Ambulatory Visit (INDEPENDENT_AMBULATORY_CARE_PROVIDER_SITE_OTHER): Payer: Medicare Other | Admitting: Internal Medicine

## 2019-03-27 ENCOUNTER — Other Ambulatory Visit: Payer: Self-pay

## 2019-03-27 ENCOUNTER — Encounter: Payer: Self-pay | Admitting: Internal Medicine

## 2019-03-27 VITALS — BP 132/88 | HR 71 | Temp 98.3°F | Ht 73.0 in | Wt 233.0 lb

## 2019-03-27 DIAGNOSIS — E785 Hyperlipidemia, unspecified: Secondary | ICD-10-CM

## 2019-03-27 DIAGNOSIS — E538 Deficiency of other specified B group vitamins: Secondary | ICD-10-CM | POA: Diagnosis not present

## 2019-03-27 DIAGNOSIS — I2584 Coronary atherosclerosis due to calcified coronary lesion: Secondary | ICD-10-CM

## 2019-03-27 DIAGNOSIS — N644 Mastodynia: Secondary | ICD-10-CM

## 2019-03-27 DIAGNOSIS — M5441 Lumbago with sciatica, right side: Secondary | ICD-10-CM

## 2019-03-27 DIAGNOSIS — Z23 Encounter for immunization: Secondary | ICD-10-CM

## 2019-03-27 DIAGNOSIS — M5442 Lumbago with sciatica, left side: Secondary | ICD-10-CM

## 2019-03-27 DIAGNOSIS — I77810 Thoracic aortic ectasia: Secondary | ICD-10-CM

## 2019-03-27 DIAGNOSIS — I2510 Atherosclerotic heart disease of native coronary artery without angina pectoris: Secondary | ICD-10-CM | POA: Diagnosis not present

## 2019-03-27 DIAGNOSIS — E559 Vitamin D deficiency, unspecified: Secondary | ICD-10-CM

## 2019-03-27 DIAGNOSIS — M791 Myalgia, unspecified site: Secondary | ICD-10-CM

## 2019-03-27 DIAGNOSIS — G8929 Other chronic pain: Secondary | ICD-10-CM

## 2019-03-27 MED ORDER — OXYCODONE HCL 10 MG PO TABS: 10.0000 mg | ORAL_TABLET | Freq: Three times a day (TID) | ORAL | 0 refills | Status: DC | PRN

## 2019-03-27 MED ORDER — FENTANYL 25 MCG/HR TD PT72: 1.0000 | MEDICATED_PATCH | TRANSDERMAL | 0 refills | Status: DC

## 2019-03-27 MED ORDER — COQ10 400 MG PO CAPS: ORAL_CAPSULE | ORAL | 3 refills | Status: DC

## 2019-03-27 NOTE — Assessment & Plan Note (Signed)
Lovastatin, aspirin 

## 2019-03-27 NOTE — Assessment & Plan Note (Signed)
On B12/B complex 

## 2019-03-27 NOTE — Progress Notes (Signed)
Subjective:  Patient ID: Lee Owens, male    DOB: 1950/11/14  Age: 68 y.o. MRN: [id number]  CC: No chief complaint on file.   HPI Lee Owens presents for severe chronic pain, spasms f/u C/o depression Is very saddened about his friend's recent death from colon cancer   Outpatient Medications Prior to Visit  Medication Sig Dispense Refill  . allopurinol (ZYLOPRIM) 300 MG tablet TAKE 1 TABLET(300 MG) BY MOUTH DAILY 90 tablet 3  . ALPRAZolam (XANAX) 1 MG tablet Take 1 tablet (1 mg total) by mouth 3 (three) times daily as needed for anxiety. 90 tablet 3  . aspirin EC 81 MG tablet Take 1 tablet (81 mg total) by mouth daily. 100 tablet 3  . buPROPion (WELLBUTRIN XL) 150 MG 24 hr tablet TAKE 2 TABLETS(300 MG) BY MOUTH DAILY 180 tablet 1  . Cholecalciferol (VITAMIN D-3) 5000 UNITS TABS Take 5,000 Units by mouth daily.    . DULoxetine (CYMBALTA) 60 MG capsule Take 1 capsule (60 mg total) by mouth 2 (two) times daily. 180 capsule 3  . fentaNYL (DURAGESIC) 25 MCG/HR Place 1 patch onto the skin every other day. 15 patch 0  . fentaNYL (DURAGESIC) 25 MCG/HR Place 1 patch onto the skin every other day. 15 patch 0  . fentaNYL (DURAGESIC) 25 MCG/HR Place 1 patch onto the skin every other day. 15 patch 0  . fentaNYL (DURAGESIC) 25 MCG/HR Place 1 patch onto the skin every other day. 15 patch 0  . finasteride (PROSCAR) 5 MG tablet Take 5 mg by mouth daily.    Marland Kitchen gabapentin (NEURONTIN) 300 MG capsule Take 1 capsule (300 mg total) by mouth at bedtime. 90 capsule 3  . L-Methylfolate-Algae 15-90.314 MG CAPS TAKE 1 CAPSULE BY MOUTH ONCE DAILY (Patient taking differently: every other day. TAKE 1 CAPSULE BY MOUTH ONCE DAILY) 30 capsule 11  . lovastatin (MEVACOR) 40 MG tablet TAKE 1 TABLET(40 MG) BY MOUTH AT BEDTIME 90 tablet 3  . NARCAN 4 MG/0.1ML LIQD nasal spray kit as needed.  0  . Oxycodone HCl 10 MG TABS Take 0.5-1 tablets (5-10 mg total) by mouth 3 (three) times daily as needed. 90 tablet 0  .  Oxycodone HCl 10 MG TABS Take 1 tablet (10 mg total) by mouth 3 (three) times daily as needed. 90 tablet 0  . Oxycodone HCl 10 MG TABS Take 1 tablet (10 mg total) by mouth every 8 (eight) hours as needed. 90 tablet 0  . Oxycodone HCl 10 MG TABS Take 1 tablet (10 mg total) by mouth 3 (three) times daily as needed. 90 tablet 0  . tamsulosin (FLOMAX) 0.4 MG CAPS capsule Take 1 capsule by mouth daily.  2  . temazepam (RESTORIL) 30 MG capsule TAKE 1 TO 2 CAPSULES BY MOUTH AT BEDTIME AS NEEDED FOR SLEEP 180 capsule 1  . vitamin B-12 (CYANOCOBALAMIN) 1000 MCG tablet Take 2,000 mcg by mouth daily.     . Vitamin D, Ergocalciferol, (DRISDOL) 1.25 MG (50000 UT) CAPS capsule TAKE 1 CAPSULE BY MOUTH EVERY 7 DAYS 12 capsule 0   No facility-administered medications prior to visit.    ROS: Review of Systems  Constitutional: Positive for fatigue. Negative for appetite change and unexpected weight change.  HENT: Negative for congestion, nosebleeds, sneezing, sore throat and trouble swallowing.   Eyes: Negative for itching and visual disturbance.  Respiratory: Negative for cough.   Cardiovascular: Negative for chest pain, palpitations and leg swelling.  Gastrointestinal: Negative for abdominal distention, blood in stool,  diarrhea and nausea.  Genitourinary: Negative for frequency and hematuria.  Musculoskeletal: Positive for arthralgias, back pain, gait problem, myalgias and neck stiffness. Negative for joint swelling and neck pain.  Skin: Negative for rash.  Neurological: Negative for dizziness, tremors, speech difficulty and weakness.  Psychiatric/Behavioral: Positive for dysphoric mood. Negative for agitation, sleep disturbance and suicidal ideas. The patient is nervous/anxious.     Objective:  BP 132/88 (BP Location: Left Arm, Patient Position: Sitting, Cuff Size: Large)   Pulse 71   Temp 98.3 F (36.8 C) (Oral)   Ht _0  (1.854 m)   Wt 233 lb (105.7 kg)   SpO2 96%   BMI 30.74 kg/m   BP  Readings from Last 3 Encounters:  03/27/19 132/88  12/26/18 130/82  10/28/18 130/84    Wt Readings from Last 3 Encounters:  03/27/19 233 lb (105.7 kg)  12/26/18 228 lb (103.4 kg)  10/28/18 232 lb 12.8 oz (105.6 kg)    Physical Exam Constitutional:      General: He is not in acute distress.    Appearance: He is well-developed.     Comments: NAD  Eyes:     Conjunctiva/sclera: Conjunctivae normal.     Pupils: Pupils are equal, round, and reactive to light.  Neck:     Thyroid: No thyromegaly.     Vascular: No JVD.  Cardiovascular:     Rate and Rhythm: Normal rate and regular rhythm.     Heart sounds: Normal heart sounds. No murmur. No friction rub. No gallop.   Pulmonary:     Effort: Pulmonary effort is normal. No respiratory distress.     Breath sounds: Normal breath sounds. No wheezing or rales.  Chest:     Chest wall: No tenderness.  Abdominal:     General: Bowel sounds are normal. There is no distension.     Palpations: Abdomen is soft. There is no mass.     Tenderness: There is no abdominal tenderness. There is no guarding or rebound.  Musculoskeletal:        General: Tenderness present. Normal range of motion.     Cervical back: Normal range of motion. Tenderness present.  Lymphadenopathy:     Cervical: No cervical adenopathy.  Skin:    General: Skin is warm and dry.     Findings: No rash.  Neurological:     Mental Status: He is alert and oriented to person, place, and time.     Cranial Nerves: No cranial nerve deficit.     Motor: Weakness present. No abnormal muscle tone.     Coordination: Coordination abnormal.     Gait: Gait abnormal.     Deep Tendon Reflexes: Reflexes are normal and symmetric.  Psychiatric:        Behavior: Behavior normal.        Thought Content: Thought content normal.        Judgment: Judgment normal.   Tearful LS stiff w/pain Sensitive nipples L>>R, L gynecomastia   Lab Results  Component Value Date   WBC 6.9 09/18/2018   HGB  15.4 09/18/2018   HCT 45.2 09/18/2018   PLT 225.0 09/18/2018   GLUCOSE 86 09/26/2018   CHOL 180 09/18/2018   TRIG 128.0 09/18/2018   HDL 48.90 09/18/2018   LDLCALC 105 (H) 09/18/2018   ALT 27 09/18/2018   AST 21 09/18/2018   NA 139 09/26/2018   K 4.2 09/26/2018   CL 103 09/26/2018   CREATININE 0.91 09/26/2018   BUN 7 09/26/2018  CO2 26 09/26/2018   TSH 1.45 09/18/2018   PSA 0.76 06/29/2016   INR 0.96 06/30/2013   HGBA1C 5.5 09/26/2018    CT CARDIAC SCORING  Addendum Date: 05/22/2018   ADDENDUM REPORT: 05/22/2018 09:06 EXAM: OVER-READ INTERPRETATION  CT CHEST The following report is an over-read performed by radiologist Dr. Samara Snide [facility] [place] on 05/22/2018. This over-read does not include interpretation of cardiac or coronary anatomy or pathology. The coronary calcium score interpretation by the cardiologist is attached. COMPARISON:  03/22/2018 chest radiograph. FINDINGS: Motion degraded scan, limiting assessment. Cardiovascular: No significant pericardial effusion/thickening. The visualized portion of the ascending thoracic aorta is ectatic with maximum diameter 4.4 cm. Main pulmonary artery is normal caliber. Mediastinum/Nodes: Unremarkable esophagus. No pathologically enlarged mediastinal or hilar lymph nodes, noting limited sensitivity for the detection of hilar adenopathy on this noncontrast study. Lungs/Pleura: No pneumothorax. No pleural effusion. No acute consolidative airspace disease, lung masses or significant pulmonary nodules. Upper abdomen: No acute abnormality. Musculoskeletal: No aggressive appearing focal osseous lesions. Moderate thoracic spondylosis. IMPRESSION: 1. Ectatic 4.4 cm ascending thoracic aorta. Recommend annual imaging followup by CTA or MRA. This recommendation follows 2010 ACCF/AHA/AATS/ACR/ASA/SCA/SCAI/SIR/STS/SVM Guidelines for the Diagnosis and Management of Patients with Thoracic Aortic Disease. Circulation. 2010; 121: K553-Z482. Aortic  aneurysm NOS (ICD10-I71.9). 2. No additional significant extracardiac findings on this motion degraded scan. Electronically Signed   By: Ilona Sorrel M.D.   On: 05/22/2018 09:06   Result Date: 05/22/2018 CLINICAL DATA:  Risk stratification EXAM: Coronary Calcium Score TECHNIQUE: The patient was scanned on a Siemens Somatom 64 slice scanner. Axial non-contrast 83m slices were carried out through the heart. The data set was analyzed on a dedicated work station and scored using the [place] FINDINGS: Non-cardiac: No significant non cardiac findings on limited lung and soft tissue windows. [facility]. Ascending Aorta: Moderately dilated aortic root 4.3 cm Pericardium: Normal Coronary arteries: Calcium noted in LM and LAD IMPRESSION: Coronary calcium score of 142. This was 542th percentile for age and sex matched control Moderately dilated aortic root 4.3 cm consider f/u CTA to further evaluate . PJenkins RougeElectronically Signed: By: PJenkins RougeM.D. On: 05/21/2018 13:53    Assessment & Plan:     Follow-up: No follow-ups on file.  AWalker Kehr MD

## 2019-03-27 NOTE — Assessment & Plan Note (Addendum)
Worse - will ref to Dr Hardin Negus Try CoQ10 Hold Lovastatin x 1 week

## 2019-03-27 NOTE — Assessment & Plan Note (Addendum)
Crestor - d/c, Lovastatin, aspirin CoQ10

## 2019-03-27 NOTE — Assessment & Plan Note (Signed)
Worse Try CoQ10 Hold Lovastatin x 1 week

## 2019-03-27 NOTE — Assessment & Plan Note (Signed)
Vit D 

## 2019-03-27 NOTE — Assessment & Plan Note (Signed)
Yearly f/u

## 2019-03-27 NOTE — Patient Instructions (Signed)
Try CoQ10 Hold Lovastatin x 1 week

## 2019-03-30 NOTE — Assessment & Plan Note (Signed)
Likely prescription for opioids related.  Obtain mammogram

## 2019-03-31 NOTE — Addendum Note (Signed)
Addended by: Karren Cobble on: 03/31/2019 04:24 PM   Modules accepted: Orders

## 2019-04-07 ENCOUNTER — Other Ambulatory Visit: Payer: Self-pay | Admitting: Internal Medicine

## 2019-04-07 DIAGNOSIS — N644 Mastodynia: Secondary | ICD-10-CM

## 2019-04-22 ENCOUNTER — Other Ambulatory Visit: Payer: Self-pay

## 2019-04-22 ENCOUNTER — Ambulatory Visit: Payer: Medicare Other

## 2019-04-22 ENCOUNTER — Ambulatory Visit
Admission: RE | Admit: 2019-04-22 | Discharge: 2019-04-22 | Disposition: A | Discharge status: Home / Self Care | Payer: Medicare Other | Source: Ambulatory Visit | Attending: Internal Medicine | Admitting: Internal Medicine

## 2019-04-22 DIAGNOSIS — N644 Mastodynia: Secondary | ICD-10-CM | POA: Diagnosis not present

## 2019-04-22 DIAGNOSIS — Z79899 Other long term (current) drug therapy: Secondary | ICD-10-CM | POA: Diagnosis not present

## 2019-04-22 DIAGNOSIS — N62 Hypertrophy of breast: Secondary | ICD-10-CM | POA: Diagnosis not present

## 2019-04-22 LAB — BASIC METABOLIC PANEL
BUN/Creatinine Ratio: 10 (ref 10–24)
BUN: 11 mg/dL (ref 8–27)
CO2: 23 mmol/L (ref 20–29)
Calcium: 9.7 mg/dL (ref 8.6–10.2)
Chloride: 100 mmol/L (ref 96–106)
Creatinine, Ser: 1.07 mg/dL (ref 0.76–1.27)
GFR calc Af Amer: 82 mL/min/{1.73_m2} (ref >59)
GFR calc non Af Amer: 71 mL/min/{1.73_m2} (ref >59)
Glucose: 105 mg/dL — ABNORMAL HIGH (ref 65–99)
Potassium: 4.8 mmol/L (ref 3.5–5.2)
Sodium: 139 mmol/L (ref 134–144)

## 2019-04-22 LAB — LIPID PANEL
Chol/HDL Ratio: 2.9 ratio (ref 0.0–5.0)
Cholesterol, Total: 150 mg/dL (ref 100–199)
HDL: 51 mg/dL
LDL Chol Calc (NIH): 81 mg/dL (ref 0–99)
Triglycerides: 94 mg/dL (ref 0–149)
VLDL Cholesterol Cal: 18 mg/dL (ref 5–40)

## 2019-04-30 ENCOUNTER — Ambulatory Visit (HOSPITAL_COMMUNITY)
Admission: RE | Admit: 2019-04-30 | Discharge: 2019-04-30 | Disposition: A | Discharge status: Home / Self Care | Payer: Medicare Other | Source: Ambulatory Visit | Attending: Cardiology | Admitting: Cardiology

## 2019-04-30 ENCOUNTER — Other Ambulatory Visit: Payer: Self-pay

## 2019-04-30 DIAGNOSIS — I712 Thoracic aortic aneurysm, without rupture, unspecified: Secondary | ICD-10-CM

## 2019-04-30 MED ORDER — IOHEXOL 350 MG/ML SOLN
100.0000 mL | Freq: Once | INTRAVENOUS | Status: AC | PRN
  Administered 2019-04-30: 16:00:00 100 mL via INTRAVENOUS

## 2019-05-01 ENCOUNTER — Inpatient Hospital Stay: Admission: RE | Admit: 2019-05-01 | Payer: Medicare Other | Source: Ambulatory Visit

## 2019-05-01 IMAGING — DX DG FOOT COMPLETE 3+V*L*
3 series · 3 of 3 positions shown · non-contrast
Comparison: None in PACs

CLINICAL DATA: History of gout and false. No recent injury. Two
weeks of generalized foot pain and swelling.

EXAM:
LEFT FOOT - COMPLETE 3+ VIEW

[foot ap]
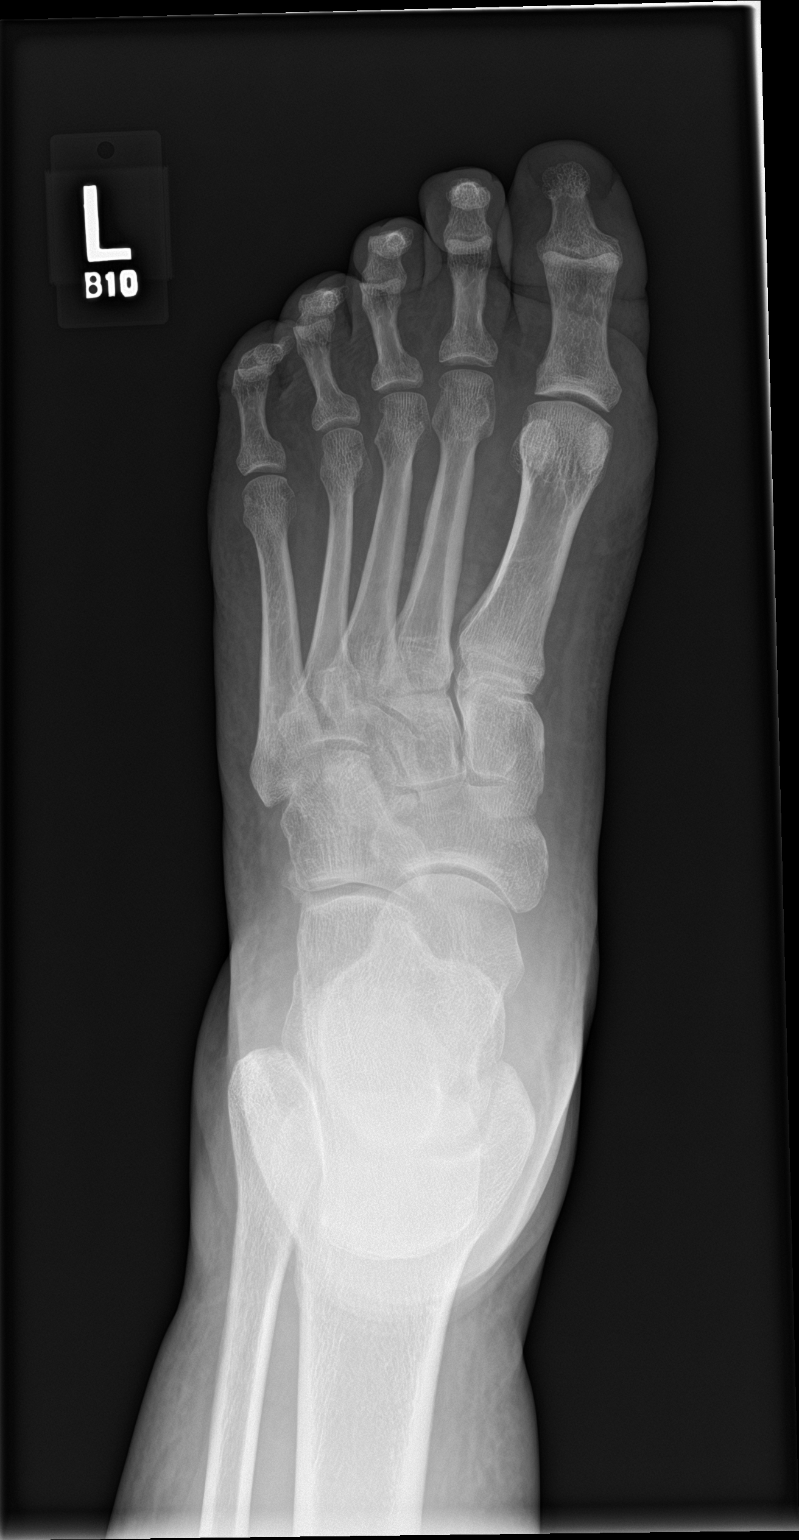

[foot obl]
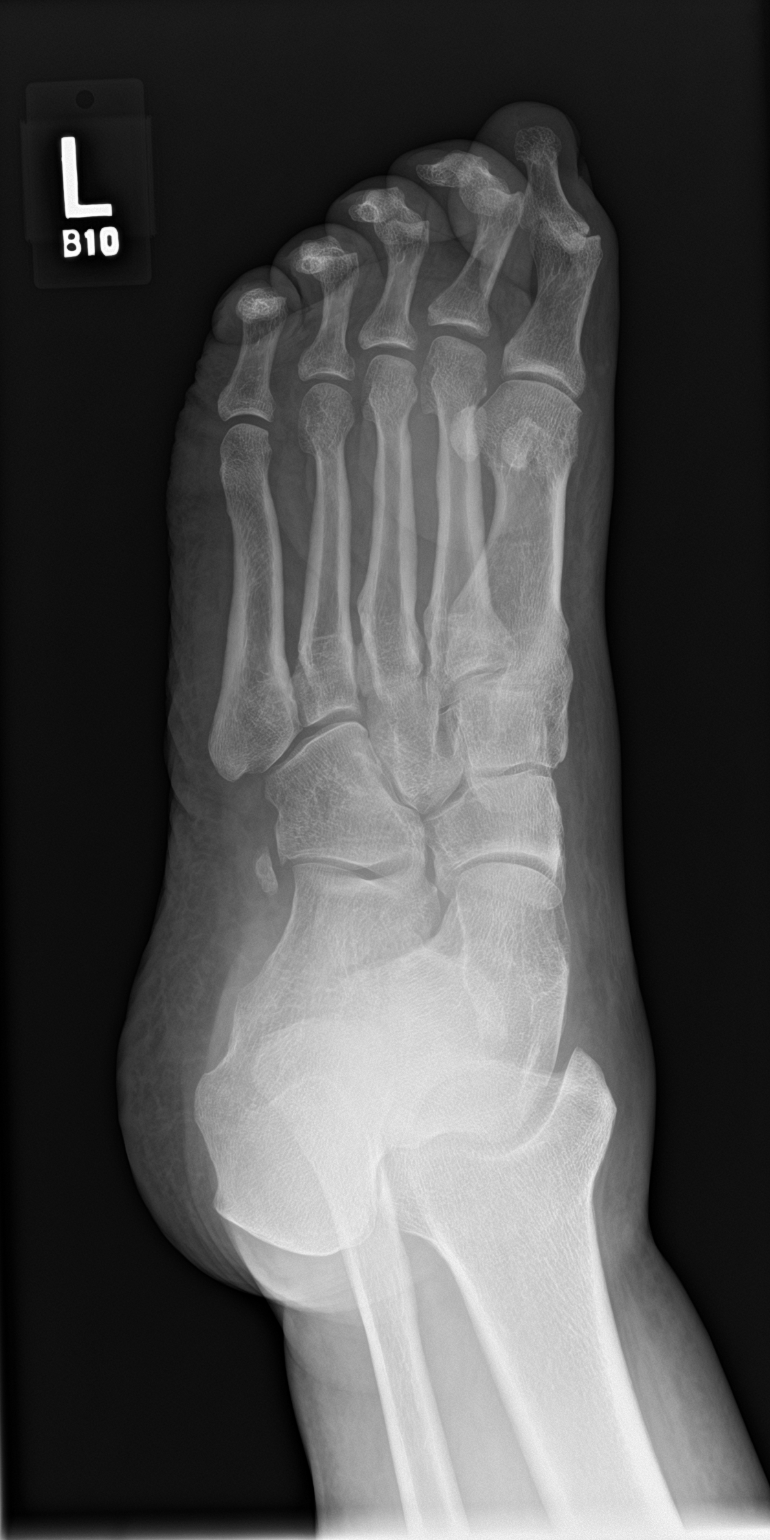

[foot lat]
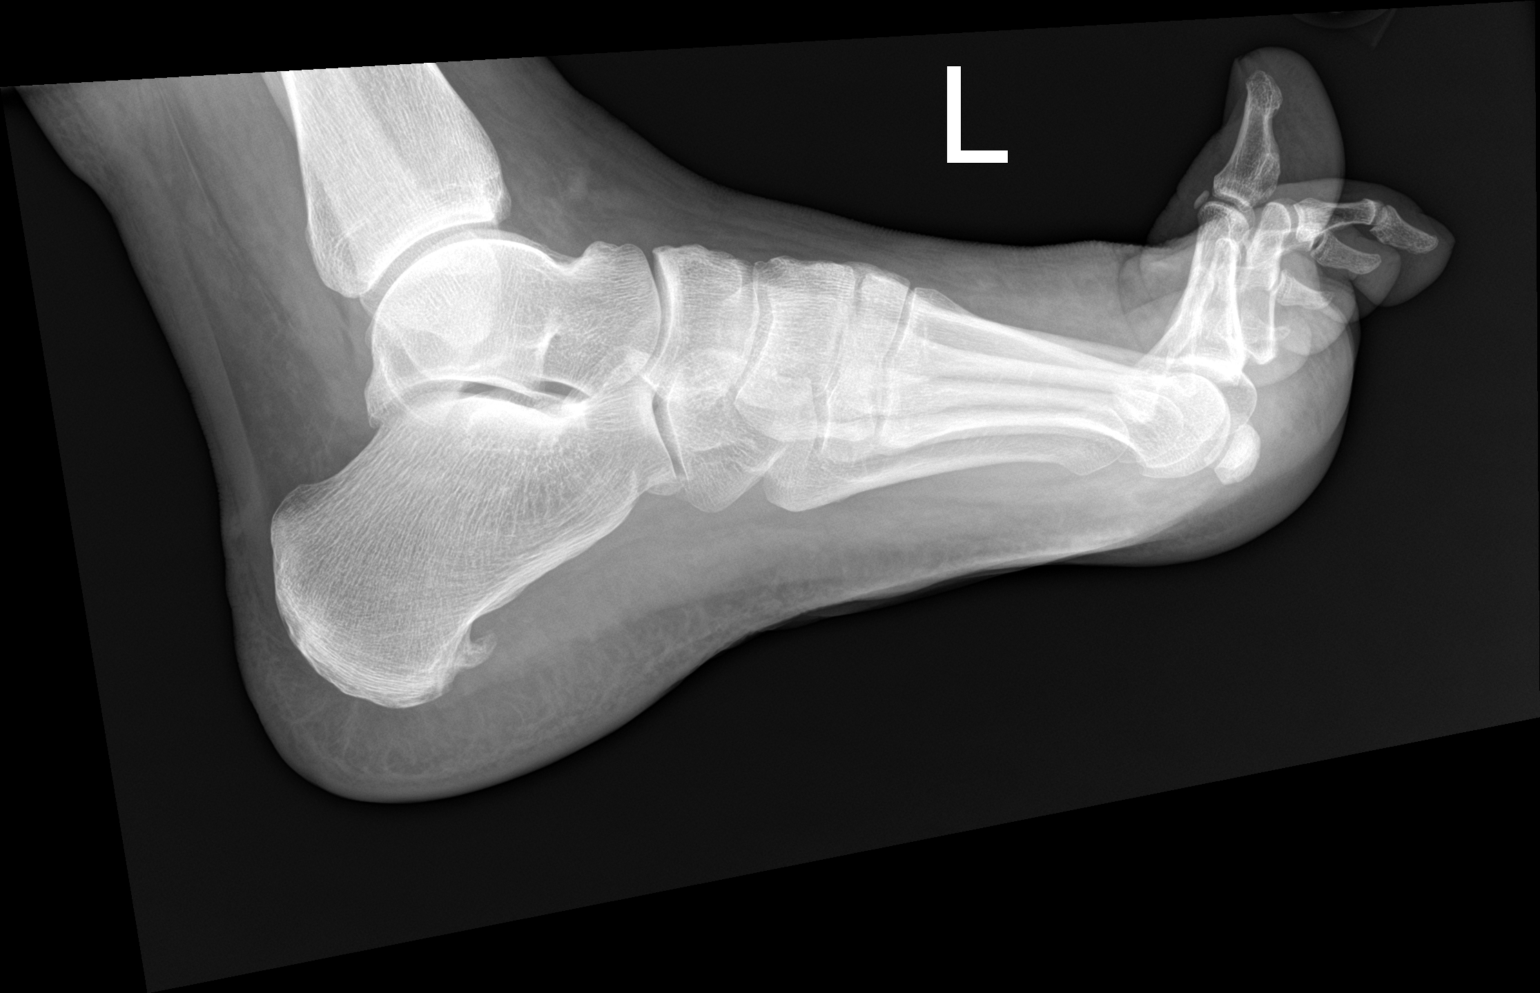

[3 of 3 positions shown; findings below may reference images not displayed]

FINDINGS: The bones are subjectively adequately mineralized. The
interphalangeal and second through fifth MTP joint spaces are
reasonably well maintained. There is very mild narrowing of the
first MTP joint. No erosive changes are observed. The soft tissues
exhibit no abnormal calcifications. Mild swelling of the forefoot is
present. The tarsals and metatarsals are intact. There is a plantar
calcaneal spur.
IMPRESSION: There is no acute bony abnormality of the left foot. No erosive
changes are observed. Minimal degenerative narrowing of the first
MTP joint. Mild soft tissue swelling of the forefoot without
abnormal calcifications.

## 2019-05-05 ENCOUNTER — Ambulatory Visit (INDEPENDENT_AMBULATORY_CARE_PROVIDER_SITE_OTHER): Payer: Medicare Other | Admitting: Cardiology

## 2019-05-05 ENCOUNTER — Encounter: Payer: Self-pay | Admitting: Cardiology

## 2019-05-05 ENCOUNTER — Other Ambulatory Visit: Payer: Self-pay

## 2019-05-05 VITALS — BP 134/88 | HR 91 | Ht 73.0 in | Wt 230.0 lb

## 2019-05-05 DIAGNOSIS — I2510 Atherosclerotic heart disease of native coronary artery without angina pectoris: Secondary | ICD-10-CM | POA: Diagnosis not present

## 2019-05-05 DIAGNOSIS — Z713 Dietary counseling and surveillance: Secondary | ICD-10-CM | POA: Diagnosis not present

## 2019-05-05 DIAGNOSIS — E7800 Pure hypercholesterolemia, unspecified: Secondary | ICD-10-CM | POA: Diagnosis not present

## 2019-05-05 DIAGNOSIS — I712 Thoracic aortic aneurysm, without rupture, unspecified: Secondary | ICD-10-CM

## 2019-05-05 DIAGNOSIS — Z712 Person consulting for explanation of examination or test findings: Secondary | ICD-10-CM

## 2019-05-05 DIAGNOSIS — Z7189 Other specified counseling: Secondary | ICD-10-CM

## 2019-05-05 NOTE — Progress Notes (Signed)
Cardiology Office Note:    Date:  05/05/2019   ID:  Lee Owens, DOB [birth year], MRN [id number]  PCP:  Lee Anger, MD  Cardiologist:  Lee Dresser, MD PhD  Referring MD: Lee Anger, MD   CC: follow up  History of Present Illness:    Lee Owens is a 69 y.o. male with a hx of hypertension who was initially seen via telemedicine visit 06/25/18. He was referred after calcium score was done by Dr. Alain Owens.   Cardiac history: he was started on rosuvastatin 10 mg in the past. He was changed back to lovastatin at his visit with Dr. Alain Owens 09/26/18. He states muscle pain was severe on rosuvastatin, returned to lovastatin. Now tolerating again without issue. Reviewed history, he is unsure of all of the statins he has tried (documentation of simvastatin, unsure about atorvastatin).  Has history of high blood pressure, was taken off BP meds due to lightheadedness. .  Today: Things have been stable from a heart perspective. Has had some personal loss, depression but feels he is coping as well as can be expected.   On lovastatin and CoQ10, tolerating well. We reviewed his labs from Dr. Alain Owens. Lipids from 04/22/2019 reviewed. LDL 81, goal <70 ideally. Discussed diet and lifestyle with this.  Reviewed most recent CT aorta. Discussed how we monitor this, how we prevent it from progressing.   Reviewed blood pressure today. Has not been taking regularly at home, but when taken it has been well controlled. He is motivated to check blood pressure knowing it will help to prevent enlargement of the aorta further.  Denies chest pain, shortness of breath at rest or with normal exertion. No PND, orthopnea, LE edema or unexpected weight gain. No syncope or palpitations. Has some wheezing first thing in the morning, chronic.    Past Medical History:  Diagnosis Date  . Allergic rhinitis   . Anxiety    takes Valium bid  . Arthritis    "hips, knees, hands" (07/07/2013)  .  Back pain    occasionally but reason unknown  . BPH (benign prostatic hypertrophy)    takes Proscar daily  . Chronic fatigue   . Complication of anesthesia    Per pt, "Hard to sedate" past last colonoscopy in 2008!  . Depression    takes Cymbalta daily  . Diverticulosis   . Dizziness    due to dehydration  . Hemorrhoids   . Hyperlipidemia    takes Lovastatin daily  . Hypertension   . Insomnia    takes Restoril nightly   . Joint pain    knees/ankles/back pain  . Joint swelling   . Muscle pain    takes Baclofen daily  . Night muscle spasms    takes Valium as needed  . Pneumonia 1971  . Right knee DJD   . Urinary frequency    takes rapaflo daily  . Vitamin B12 deficiency 2009  . Vitamin D deficiency disease     Past Surgical History:  Procedure Laterality Date  . COLONOSCOPY    . JOINT REPLACEMENT     both knees  . KNEE ARTHROSCOPY Bilateral 2012  . PROSTATE BIOPSY  01/2010  . 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   Procedure: TOTAL KNEE ARTHROPLASTY- left;  Surgeon: Lorn Junes, MD;  Location: [facility];  Service: Orthopedics;  Laterality: Left;  . TOTAL KNEE ARTHROPLASTY Right 07/07/2013  . TOTAL KNEE ARTHROPLASTY Right 07/07/2013   Procedure: TOTAL KNEE ARTHROPLASTY- right;  Surgeon: Audree Camel  Noemi Chapel, MD;  Location: [facility];  Service: Orthopedics;  Laterality: Right;    Current Medications: Current Outpatient Medications on File Prior to Visit  Medication Sig  . allopurinol (ZYLOPRIM) 300 MG tablet TAKE 1 TABLET(300 MG) BY MOUTH DAILY  . ALPRAZolam (XANAX) 1 MG tablet Take 1 tablet (1 mg total) by mouth 3 (three) times daily as needed for anxiety.  Marland Kitchen buPROPion (WELLBUTRIN XL) 150 MG 24 hr tablet TAKE 2 TABLETS(300 MG) BY MOUTH DAILY  . Cholecalciferol (VITAMIN D-3) 5000 UNITS TABS Take 5,000 Units by mouth daily.  . Coenzyme Q10 (COQ10) 400 MG CAPS 1 po qd  . DULoxetine (CYMBALTA) 60 MG capsule Take 1 capsule (60 mg total) by mouth 2 (two) times daily.  . fentaNYL  (DURAGESIC) 25 MCG/HR Place 1 patch onto the skin every other day.  . fentaNYL (DURAGESIC) 25 MCG/HR Place 1 patch onto the skin every other day.  . finasteride (PROSCAR) 5 MG tablet Take 5 mg by mouth daily.  Marland Kitchen gabapentin (NEURONTIN) 300 MG capsule Take 1 capsule (300 mg total) by mouth at bedtime.  Marland Kitchen L-Methylfolate-Algae 15-90.314 MG CAPS TAKE 1 CAPSULE BY MOUTH ONCE DAILY (Patient taking differently: every other day. TAKE 1 CAPSULE BY MOUTH ONCE DAILY)  . lovastatin (MEVACOR) 40 MG tablet TAKE 1 TABLET(40 MG) BY MOUTH AT BEDTIME  . NARCAN 4 MG/0.1ML LIQD nasal spray kit as needed.  . Oxycodone HCl 10 MG TABS Take 0.5-1 tablets (5-10 mg total) by mouth 3 (three) times daily as needed.  . Oxycodone HCl 10 MG TABS Take 1 tablet (10 mg total) by mouth 3 (three) times daily as needed.  . Oxycodone HCl 10 MG TABS Take 1 tablet (10 mg total) by mouth every 8 (eight) hours as needed.  . Oxycodone HCl 10 MG TABS Take 1 tablet (10 mg total) by mouth 3 (three) times daily as needed.  . tamsulosin (FLOMAX) 0.4 MG CAPS capsule Take 1 capsule by mouth daily.  . temazepam (RESTORIL) 30 MG capsule TAKE 1 TO 2 CAPSULES BY MOUTH AT BEDTIME AS NEEDED FOR SLEEP  . vitamin B-12 (CYANOCOBALAMIN) 1000 MCG tablet Take 2,000 mcg by mouth daily.   . Vitamin D, Ergocalciferol, (DRISDOL) 1.25 MG (50000 UT) CAPS capsule TAKE 1 CAPSULE BY MOUTH EVERY 7 DAYS   No current facility-administered medications on file prior to visit.     Allergies:   Crestor [rosuvastatin calcium]   Social History   Tobacco Use  . Smoking status: Former Smoker    Packs/day: 1.00    Years: 38.00    Pack years: 38.00    Types: Cigarettes    Quit date: 10/23/2005    Years since quitting: 13.5  . Smokeless tobacco: Never Used  . Tobacco comment: quit smoking 2008  Substance Use Topics  . Alcohol use: Yes    Alcohol/week: 14.0 standard drinks    Types: 14 Cans of beer per week    Comment: 03/09/17 "2 beers daily"  . Drug use: No     Comment: quit age 54    Family History: The patient's family history includes Atrial fibrillation in his father; Coronary artery disease in his father and another family member; Dementia in his sister; Heart failure in his father; Hypertension in an other family member; Kidney disease in his father; Liver cancer in his mother.  ROS:   Please see the history of present illness.  Additional pertinent ROS negative except as documented  EKGs/Labs/Other Studies Reviewed:    The following studies  were reviewed today: CT angio 04/30/19 Cardiovascular: The ascending thoracic aorta is aneurysmal measuring up to approximately 4.1 cm in diameter (axial series 4, image 66). There is no evidence for thoracic aortic dissection or aneurysm. Minimal atherosclerotic changes are noted of the thoracic aorta. Mild coronary artery calcifications are noted. The heart size is normal. No acute pulmonary embolism was detected on this exam. There is no significant pericardial effusion.   EKG:  EKG is personally reviewed.  The ekg ordered 05/05/2013 demonstrates NSR  Recent Labs: 09/18/2018: ALT 27; Hemoglobin 15.4; Platelets 225.0; TSH 1.45 04/22/2019: BUN 11; Creatinine, Ser 1.07; Potassium 4.8; Sodium 139  Recent Lipid Panel    Component Value Date/Time   CHOL 150 04/22/2019 1150   TRIG 94 04/22/2019 1150   HDL 51 04/22/2019 1150   CHOLHDL 2.9 04/22/2019 1150   CHOLHDL 4 09/18/2018 1038   VLDL 25.6 09/18/2018 1038   LDLCALC 81 04/22/2019 1150    Physical Exam:    VS:  BP 134/88   Pulse 91   Ht 6' 1"  (1.854 m)   Wt 230 lb (104.3 kg)   SpO2 98%   BMI 30.34 kg/m     Wt Readings from Last 3 Encounters:  05/05/19 230 lb (104.3 kg)  03/27/19 233 lb (105.7 kg)  12/26/18 228 lb (103.4 kg)    GEN: Well nourished, well developed in no acute distress HEENT: Normal, moist mucous membranes NECK: No JVD CARDIAC: regular rhythm, normal S1 and S2, no rubs or gallops. No murmur. VASCULAR: Radial and DP  pulses 2+ bilaterally. No carotid bruits RESPIRATORY:  Clear to auscultation without rales, wheezing or rhonchi  ABDOMEN: Soft, non-tender, non-distended MUSCULOSKELETAL:  Ambulates independently SKIN: Warm and dry, no edema NEUROLOGIC:  Alert and oriented x 3. No focal neuro deficits noted. PSYCHIATRIC:  Normal affect   ASSESSMENT:    1. Thoracic aortic aneurysm without rupture ([place])   2. Coronary artery calcification seen on CT scan   3. Counseling on health promotion and disease prevention   4. Nutritional counseling   5. Encounter to discuss test results   6. Cardiac risk counseling   7. Pure hypercholesterolemia    PLAN:    Coronary calcium seen on CT imaging: calcium score 129/55th percentile. No symptoms.  -this is consistent with mild CAD and would treat accordingly -already on aspirin 81 mg daily, tolerating -did not tolerate rosuvastatin, simvastatin. Tolerating lovastatin and CoQ10 -discussed CV risk management, below  Thoracic aortic ectasia: we reviewed the results of his CT angio together today -CT angio shows 4.1 cm. Repeat in 1 year, ordered  Hypertension:  -reviewed goal of <130/80 given TAA -continue to monitor, would start ARB or beta blocker if rises  Hyperlipidemia: LDL goal <70 -LDL 81 on most recent lipids, discussed options, below -continue lovastatin and CoQ10  Cardiac risk counseling and prevention recommendations: We focused a lot on lifestyle today. His LDL is not at goal, but close. He would like to work on diet and exercise first before adjusting medications further. -recommend heart healthy/Mediterranean diet, with whole grains, fruits, vegetable, fish, lean meats, nuts, and olive oil. Limit salt. -recommend moderate walking, 3-5 times/week for 30-50 minutes each session. Aim for at least 150 minutes.week. Goal should be pace of 3 miles/hours, or walking 1.5 miles in 30 minutes -recommend avoidance of tobacco products. Avoid excess  alcohol.  Plan for follow up: 1 year with CT angio sooner  Medication Adjustments/Labs and Tests Ordered: Current medicines are reviewed at length with the  patient today.  Concerns regarding medicines are outlined above.  No orders of the defined types were placed in this encounter.  No orders of the defined types were placed in this encounter.   Patient Instructions  Medication Instructions:  Your Physician recommend you continue on your current medication as directed.    *If you need a refill on your cardiac medications before your next appointment, please call your pharmacy*  Lab Work: None  Testing/Procedures: None  Follow-Up: At [facility], you and your health needs are our priority.  As part of our continuing mission to provide you with exceptional heart care, we have created designated Provider Care Teams.  These Care Teams include your primary Cardiologist (physician) and Advanced Practice Providers (APPs -  Physician Assistants and Nurse Practitioners) who all work together to provide you with the care you need, when you need it.  Your next appointment:   1 year(s)  The format for your next appointment:   In Person  Provider:   Buford Dresser, MD       Signed, Lee Dresser, MD PhD 05/05/2019  [place]

## 2019-05-05 NOTE — Patient Instructions (Signed)

## 2019-05-11 ENCOUNTER — Ambulatory Visit: Payer: Medicare Other | Attending: Internal Medicine

## 2019-05-11 DIAGNOSIS — Z23 Encounter for immunization: Secondary | ICD-10-CM | POA: Insufficient documentation

## 2019-05-11 NOTE — Progress Notes (Signed)
   Covid-19 Vaccination Clinic  Name:  Ralston Kellas    MRN: [id number] DOB: [birth year]  05/11/2019  Mr. Baerwald was observed post Covid-19 immunization for 15 minutes without incidence. He was provided with Vaccine Information Sheet and instruction to access the V-Safe system.   Mr. Diersen was instructed to call 911 with any severe reactions post vaccine: Marland Kitchen Difficulty breathing  . Swelling of your face and throat  . A fast heartbeat  . A bad rash all over your body  . Dizziness and weakness    Immunizations Administered    Name Date Dose VIS Date Route   Pfizer COVID-19 Vaccine 05/11/2019 11:10 AM 0.3 mL 03/07/2019 Intramuscular   Manufacturer: [place]   Lot: [id number]   [place]: [id number]

## 2019-05-20 ENCOUNTER — Other Ambulatory Visit: Payer: Self-pay | Admitting: Internal Medicine

## 2019-05-20 MED ORDER — DIAZEPAM 10 MG PO TABS: 5.0000 mg | ORAL_TABLET | Freq: Three times a day (TID) | ORAL | 2 refills | Status: DC | PRN

## 2019-06-03 ENCOUNTER — Ambulatory Visit: Payer: Medicare Other | Attending: Internal Medicine

## 2019-06-03 DIAGNOSIS — Z23 Encounter for immunization: Secondary | ICD-10-CM | POA: Insufficient documentation

## 2019-06-03 NOTE — Progress Notes (Signed)
   Covid-19 Vaccination Clinic  Name:  Lee Owens    MRN: [id number] DOB: 1950/07/03  06/03/2019  Mr. Lee Owens was observed post Covid-19 immunization for 15 minutes without incident. He was provided with Vaccine Information Sheet and instruction to access the V-Safe system.   Mr. Lee Owens was instructed to call 911 with any severe reactions post vaccine: Marland Kitchen Difficulty breathing  . Swelling of face and throat  . A fast heartbeat  . A bad rash all over body  . Dizziness and weakness   Immunizations Administered    Name Date Dose VIS Date Route   Pfizer COVID-19 Vaccine 06/03/2019  1:03 PM 0.3 mL 03/07/2019 Intramuscular   Manufacturer: [place]   Lot: [id number]   [place]: [id number]

## 2019-06-19 ENCOUNTER — Other Ambulatory Visit: Payer: Self-pay | Admitting: Internal Medicine

## 2019-06-24 ENCOUNTER — Encounter: Payer: Self-pay | Admitting: Internal Medicine

## 2019-06-24 ENCOUNTER — Other Ambulatory Visit: Payer: Self-pay

## 2019-06-24 ENCOUNTER — Ambulatory Visit (INDEPENDENT_AMBULATORY_CARE_PROVIDER_SITE_OTHER): Payer: Medicare Other | Admitting: Internal Medicine

## 2019-06-24 DIAGNOSIS — E559 Vitamin D deficiency, unspecified: Secondary | ICD-10-CM | POA: Diagnosis not present

## 2019-06-24 DIAGNOSIS — F419 Anxiety disorder, unspecified: Secondary | ICD-10-CM | POA: Diagnosis not present

## 2019-06-24 DIAGNOSIS — M5441 Lumbago with sciatica, right side: Secondary | ICD-10-CM

## 2019-06-24 DIAGNOSIS — I10 Essential (primary) hypertension: Secondary | ICD-10-CM

## 2019-06-24 DIAGNOSIS — E785 Hyperlipidemia, unspecified: Secondary | ICD-10-CM

## 2019-06-24 DIAGNOSIS — G8929 Other chronic pain: Secondary | ICD-10-CM

## 2019-06-24 DIAGNOSIS — M5442 Lumbago with sciatica, left side: Secondary | ICD-10-CM | POA: Diagnosis not present

## 2019-06-24 DIAGNOSIS — E538 Deficiency of other specified B group vitamins: Secondary | ICD-10-CM

## 2019-06-24 DIAGNOSIS — I2510 Atherosclerotic heart disease of native coronary artery without angina pectoris: Secondary | ICD-10-CM

## 2019-06-24 MED ORDER — FENTANYL 25 MCG/HR TD PT72: 1.0000 | MEDICATED_PATCH | TRANSDERMAL | 0 refills | Status: DC

## 2019-06-24 MED ORDER — OXYCODONE HCL 10 MG PO TABS: 10.0000 mg | ORAL_TABLET | Freq: Three times a day (TID) | ORAL | 0 refills | Status: DC | PRN

## 2019-06-24 NOTE — Assessment & Plan Note (Signed)
Lovastatin, aspirin CoQ10

## 2019-06-24 NOTE — Assessment & Plan Note (Signed)
B12 def

## 2019-06-24 NOTE — Assessment & Plan Note (Signed)
Vit D 

## 2019-06-24 NOTE — Assessment & Plan Note (Signed)
Lovastatin x 1 week

## 2019-06-24 NOTE — Progress Notes (Signed)
Subjective:  Patient ID: Lee Owens, male    DOB: [birth year]  Age: 69 y.o. MRN: [id number]  CC: No chief complaint on file.   HPI [name] presents for chronic pain, stiffness Pain clinic visit has not worked out  Outpatient Medications Prior to Visit  Medication Sig Dispense Refill  . allopurinol (ZYLOPRIM) 300 MG tablet TAKE 1 TABLET(300 MG) BY MOUTH DAILY 90 tablet 3  . buPROPion (WELLBUTRIN XL) 150 MG 24 hr tablet TAKE 2 TABLETS(300 MG) BY MOUTH DAILY 180 tablet 1  . Cholecalciferol (VITAMIN D-3) 5000 UNITS TABS Take 5,000 Units by mouth daily.    . Coenzyme Q10 (COQ10) 400 MG CAPS 1 po qd 100 capsule 3  . diazepam (VALIUM) 10 MG tablet Take 0.5-1 tablets (5-10 mg total) by mouth every 8 (eight) hours as needed for anxiety (muscle spasms). 90 tablet 2  . DULoxetine (CYMBALTA) 60 MG capsule Take 1 capsule (60 mg total) by mouth 2 (two) times daily. 180 capsule 3  . fentaNYL (DURAGESIC) 25 MCG/HR Place 1 patch onto the skin every other day. 15 patch 0  . fentaNYL (DURAGESIC) 25 MCG/HR Place 1 patch onto the skin every other day. 15 patch 0  . finasteride (PROSCAR) 5 MG tablet Take 5 mg by mouth daily.    Marland Kitchen gabapentin (NEURONTIN) 300 MG capsule Take 1 capsule (300 mg total) by mouth at bedtime. 90 capsule 3  . L-Methylfolate-Algae 15-90.314 MG CAPS TAKE 1 CAPSULE BY MOUTH ONCE DAILY 30 capsule 11  . lovastatin (MEVACOR) 40 MG tablet TAKE 1 TABLET(40 MG) BY MOUTH AT BEDTIME 90 tablet 3  . NARCAN 4 MG/0.1ML LIQD nasal spray kit as needed.  0  . Oxycodone HCl 10 MG TABS Take 1 tablet (10 mg total) by mouth 3 (three) times daily as needed. 90 tablet 0  . Oxycodone HCl 10 MG TABS Take 1 tablet (10 mg total) by mouth every 8 (eight) hours as needed. 90 tablet 0  . Oxycodone HCl 10 MG TABS Take 1 tablet (10 mg total) by mouth 3 (three) times daily as needed. 90 tablet 0  . tamsulosin (FLOMAX) 0.4 MG CAPS capsule Take 1 capsule by mouth daily.  2  . temazepam (RESTORIL) 30 MG capsule  TAKE 1 TO 2 CAPSULES BY MOUTH AT BEDTIME AS NEEDED FOR SLEEP 180 capsule 1  . vitamin B-12 (CYANOCOBALAMIN) 1000 MCG tablet Take 2,000 mcg by mouth daily.     . Vitamin D, Ergocalciferol, (DRISDOL) 1.25 MG (50000 UT) CAPS capsule TAKE 1 CAPSULE BY MOUTH EVERY 7 DAYS 12 capsule 0  . Oxycodone HCl 10 MG TABS Take 0.5-1 tablets (5-10 mg total) by mouth 3 (three) times daily as needed. 90 tablet 0   No facility-administered medications prior to visit.    ROS: Review of Systems  Constitutional: Positive for fatigue. Negative for appetite change and unexpected weight change.  HENT: Negative for congestion, nosebleeds, sneezing, sore throat and trouble swallowing.   Eyes: Negative for itching and visual disturbance.  Respiratory: Negative for cough.   Cardiovascular: Negative for chest pain, palpitations and leg swelling.  Gastrointestinal: Negative for abdominal distention, blood in stool, diarrhea and nausea.  Genitourinary: Negative for frequency and hematuria.  Musculoskeletal: Positive for arthralgias, back pain, gait problem and myalgias. Negative for joint swelling and neck pain.  Skin: Negative for rash.  Neurological: Negative for dizziness, tremors, speech difficulty and weakness.  Psychiatric/Behavioral: Positive for decreased concentration and dysphoric mood. Negative for agitation, sleep disturbance and suicidal ideas. The  patient is nervous/anxious.     Objective:  BP 130/84 (BP Location: Left Arm, Patient Position: Sitting, Cuff Size: Large)   Pulse 85   Temp 98.2 F (36.8 C) (Oral)   Ht 6' 1"  (1.854 m)   Wt 233 lb (105.7 kg)   SpO2 95%   BMI 30.74 kg/m   BP Readings from Last 3 Encounters:  06/24/19 130/84  05/05/19 134/88  03/27/19 132/88    Wt Readings from Last 3 Encounters:  06/24/19 233 lb (105.7 kg)  05/05/19 230 lb (104.3 kg)  03/27/19 233 lb (105.7 kg)    Physical Exam Constitutional:      General: He is not in acute distress.    Appearance: He is  well-developed.     Comments: NAD  Eyes:     Conjunctiva/sclera: Conjunctivae normal.     Pupils: Pupils are equal, round, and reactive to light.  Neck:     Thyroid: No thyromegaly.     Vascular: No JVD.  Cardiovascular:     Rate and Rhythm: Normal rate and regular rhythm.     Heart sounds: Normal heart sounds. No murmur. No friction rub. No gallop.   Pulmonary:     Effort: Pulmonary effort is normal. No respiratory distress.     Breath sounds: Normal breath sounds. No wheezing or rales.  Chest:     Chest wall: No tenderness.  Abdominal:     General: Bowel sounds are normal. There is no distension.     Palpations: Abdomen is soft. There is no mass.     Tenderness: There is no abdominal tenderness. There is no guarding or rebound.  Musculoskeletal:        General: Tenderness present. Normal range of motion.     Cervical back: Normal range of motion.  Lymphadenopathy:     Cervical: No cervical adenopathy.  Skin:    General: Skin is warm and dry.     Findings: No rash.  Neurological:     Mental Status: He is alert and oriented to person, place, and time.     Cranial Nerves: No cranial nerve deficit.     Motor: No abnormal muscle tone.     Coordination: Coordination normal.     Gait: Gait normal.     Deep Tendon Reflexes: Reflexes are normal and symmetric.  Psychiatric:        Behavior: Behavior normal.        Thought Content: Thought content normal.        Judgment: Judgment normal.    LS, joints - tender   Lab Results  Component Value Date   WBC 6.9 09/18/2018   HGB 15.4 09/18/2018   HCT 45.2 09/18/2018   PLT 225.0 09/18/2018   GLUCOSE 105 (H) 04/22/2019   CHOL 150 04/22/2019   TRIG 94 04/22/2019   HDL 51 04/22/2019   LDLCALC 81 04/22/2019   ALT 27 09/18/2018   AST 21 09/18/2018   NA 139 04/22/2019   K 4.8 04/22/2019   CL 100 04/22/2019   CREATININE 1.07 04/22/2019   BUN 11 04/22/2019   CO2 23 04/22/2019   TSH 1.45 09/18/2018   PSA 0.76 06/29/2016   INR  0.96 06/30/2013   HGBA1C 5.5 09/26/2018    CT ANGIO CHEST AORTA W &/OR WO CONTRAST  Result Date: 04/30/2019 CLINICAL DATA:  Thoracic aorta follow-up. EXAM: CT ANGIOGRAPHY CHEST WITH CONTRAST TECHNIQUE: Multidetector CT imaging of the chest was performed using the standard protocol during bolus administration of intravenous contrast.  Multiplanar CT image reconstructions and MIPs were obtained to evaluate the vascular anatomy. CONTRAST:  155m OMNIPAQUE IOHEXOL 350 MG/ML SOLN COMPARISON:  CT dated 05/21/2018. FINDINGS: Cardiovascular: The ascending thoracic aorta is aneurysmal measuring up to approximately 4.1 cm in diameter (axial series 4, image 66). There is no evidence for thoracic aortic dissection or aneurysm. Minimal atherosclerotic changes are noted of the thoracic aorta. Mild coronary artery calcifications are noted. The heart size is normal. No acute pulmonary embolism was detected on this exam. There is no significant pericardial effusion. Mediastinum/Nodes: --No mediastinal or hilar lymphadenopathy. --No axillary lymphadenopathy. --No supraclavicular lymphadenopathy. --Normal thyroid gland. --The esophagus is unremarkable Lungs/Pleura: No pulmonary nodules or masses. No pleural effusion or pneumothorax. No focal airspace consolidation. No focal pleural abnormality. Upper Abdomen: No acute abnormality. Musculoskeletal: No chest wall abnormality. No acute or significant osseous findings. Review of the MIP images confirms the above findings. IMPRESSION: 1. 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 Recommend annual imaging followup by CTA or MRA. This recommendation follows 2010 ACCF/AHA/AATS/ACR/ASA/SCA/SCAI/SIR/STS/SVM Guidelines for the Diagnosis and Management of Patients with Thoracic Aortic Disease. Circulation. 2010; 121:: [id number] Aortic aneurysm NOS (ICD10-I71.9) 2. No acute abnormality. No evidence for thoracic aortic dissection. No large centrally  located pulmonary embolism. Aortic Atherosclerosis (ICD10-I70.0). Electronically Signed   By: CConstance HolsterM.D.   On: 04/30/2019 23:28    Assessment & Plan:   There are no diagnoses linked to this encounter.   No orders of the defined types were placed in this encounter.    Follow-up: No follow-ups on file.  AWalker Kehr MD

## 2019-06-24 NOTE — Assessment & Plan Note (Signed)
BP Readings from Last 3 Encounters:  06/24/19 130/84  05/05/19 134/88  03/27/19 132/88

## 2019-06-24 NOTE — Assessment & Plan Note (Signed)
Diazepam prn  Potential benefits of a long term benzodiazepines  use as well as potential risks  and complications were explained to the patient and were aknowledged. 

## 2019-07-22 ENCOUNTER — Other Ambulatory Visit: Payer: Self-pay

## 2019-07-22 ENCOUNTER — Ambulatory Visit (INDEPENDENT_AMBULATORY_CARE_PROVIDER_SITE_OTHER): Payer: Medicare Other | Admitting: Podiatry

## 2019-07-22 ENCOUNTER — Encounter: Payer: Self-pay | Admitting: Podiatry

## 2019-07-22 VITALS — BP 146/93 | HR 69

## 2019-07-22 DIAGNOSIS — I2510 Atherosclerotic heart disease of native coronary artery without angina pectoris: Secondary | ICD-10-CM | POA: Diagnosis not present

## 2019-07-22 DIAGNOSIS — L600 Ingrowing nail: Secondary | ICD-10-CM | POA: Diagnosis not present

## 2019-07-22 MED ORDER — NEOMYCIN-POLYMYXIN-HC 1 % OT SOLN: OTIC | 1 refills | Status: DC

## 2019-07-22 NOTE — Patient Instructions (Signed)

## 2019-07-22 NOTE — Progress Notes (Signed)
Subjective:  Patient ID: Lee Owens, male    DOB: 04-Nov-1950,  MRN: [id number] HPI Chief Complaint  Patient presents with  . Nail Problem    B/ l great toenails possible infected w/ left great toe worse which  is swollen, pink and painful  w/ touch on the lateral border of nail since 1 year,has been using peroxide on the toe    69 y.o. male presents with the above complaint.   ROS: Denies fever chills nausea vomiting muscle aches pains calf pain back pain chest pain l headache.  Past Medical History:  Diagnosis Date  . Allergic rhinitis   . Anxiety    takes Valium bid  . Arthritis    "hips, knees, hands" (07/07/2013)  . Back pain    occasionally but reason unknown  . BPH (benign prostatic hypertrophy)    takes Proscar daily  . Chronic fatigue   . Complication of anesthesia    Per pt, "Hard to sedate" past last colonoscopy in 2008!  . Depression    takes Cymbalta daily  . Diverticulosis   . Dizziness    due to dehydration  . Hemorrhoids   . Hyperlipidemia    takes Lovastatin daily  . Hypertension   . Insomnia    takes Restoril nightly   . Joint pain    knees/ankles/back pain  . Joint swelling   . Muscle pain    takes Baclofen daily  . Night muscle spasms    takes Valium as needed  . Pneumonia 1971  . Right knee DJD   . Urinary frequency    takes rapaflo daily  . Vitamin B12 deficiency 2009  . Vitamin D deficiency disease    Past Surgical History:  Procedure Laterality Date  . COLONOSCOPY    . JOINT REPLACEMENT     both knees  . KNEE ARTHROSCOPY Bilateral 2012  . PROSTATE BIOPSY  01/2010  . 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   Procedure: TOTAL KNEE ARTHROPLASTY- left;  Surgeon: Lorn Junes, MD;  Location: [place];  Service: Orthopedics;  Laterality: Left;  . TOTAL KNEE ARTHROPLASTY Right 07/07/2013  . TOTAL KNEE ARTHROPLASTY Right 07/07/2013   Procedure: TOTAL KNEE ARTHROPLASTY- right;  Surgeon: Lorn Junes, MD;  Location: [facility];  Service:  Orthopedics;  Laterality: Right;    Current Outpatient Medications:  .  allopurinol (ZYLOPRIM) 300 MG tablet, TAKE 1 TABLET(300 MG) BY MOUTH DAILY, Disp: 90 tablet, Rfl: 3 .  buPROPion (WELLBUTRIN XL) 150 MG 24 hr tablet, TAKE 2 TABLETS(300 MG) BY MOUTH DAILY, Disp: 180 tablet, Rfl: 1 .  Cholecalciferol (VITAMIN D-3) 5000 UNITS TABS, Take 5,000 Units by mouth daily., Disp: , Rfl:  .  Coenzyme Q10 (COQ10) 400 MG CAPS, 1 po qd, Disp: 100 capsule, Rfl: 3 .  diazepam (VALIUM) 10 MG tablet, Take 0.5-1 tablets (5-10 mg total) by mouth every 8 (eight) hours as needed for anxiety (muscle spasms)., Disp: 90 tablet, Rfl: 2 .  DULoxetine (CYMBALTA) 60 MG capsule, Take 1 capsule (60 mg total) by mouth 2 (two) times daily., Disp: 180 capsule, Rfl: 3 .  fentaNYL (DURAGESIC) 25 MCG/HR, Place 1 patch onto the skin every other day., Disp: 15 patch, Rfl: 0 .  finasteride (PROSCAR) 5 MG tablet, Take 5 mg by mouth daily., Disp: , Rfl:  .  gabapentin (NEURONTIN) 300 MG capsule, Take 1 capsule (300 mg total) by mouth at bedtime., Disp: 90 capsule, Rfl: 3 .  L-Methylfolate-Algae 15-90.314 MG CAPS, TAKE 1 CAPSULE BY MOUTH  ONCE DAILY, Disp: 30 capsule, Rfl: 11 .  lovastatin (MEVACOR) 40 MG tablet, TAKE 1 TABLET(40 MG) BY MOUTH AT BEDTIME, Disp: 90 tablet, Rfl: 3 .  NARCAN 4 MG/0.1ML LIQD nasal spray kit, as needed., Disp: , Rfl: 0 .  Oxycodone HCl 10 MG TABS, Take 1 tablet (10 mg total) by mouth 3 (three) times daily as needed., Disp: 90 tablet, Rfl: 0 .  tamsulosin (FLOMAX) 0.4 MG CAPS capsule, Take 1 capsule by mouth daily., Disp: , Rfl: 2 .  temazepam (RESTORIL) 30 MG capsule, TAKE 1 TO 2 CAPSULES BY MOUTH AT BEDTIME AS NEEDED FOR SLEEP, Disp: 180 capsule, Rfl: 1 .  vitamin B-12 (CYANOCOBALAMIN) 1000 MCG tablet, Take 2,000 mcg by mouth daily. , Disp: , Rfl:  .  Vitamin D, Ergocalciferol, (DRISDOL) 1.25 MG (50000 UT) CAPS capsule, TAKE 1 CAPSULE BY MOUTH EVERY 7 DAYS, Disp: 12 capsule, Rfl: 0 .   NEOMYCIN-POLYMYXIN-HYDROCORTISONE (CORTISPORIN) 1 % SOLN OTIC solution, Apply 1-2 drops to toe BID after soaking, Disp: 10 mL, Rfl: 1  Allergies  Allergen Reactions  . Crestor [Rosuvastatin Calcium]     myalgias   Review of Systems Objective:   Vitals:   07/22/19 1046  BP: (!) 146/93  Pulse: 69    General: Well developed, nourished, in no acute distress, alert and oriented x3   Dermatological: Skin is warm, dry and supple bilateral. Nails x 10 are well maintained; remaining integument appears unremarkable at this time. There are no open sores, no preulcerative lesions, no rash or signs of infection present. Sharp incurvated nail margin along the tibial border of the hallux left exquisitely tender on palpation. Margin is sharply incurvated.  Vascular: Dorsalis Pedis artery and Posterior Tibial artery pedal pulses are 2/4 bilateral with immedate capillary fill time. Pedal hair growth present. No varicosities and no lower extremity edema present bilateral.   Neruologic: Grossly intact via light touch bilateral. Vibratory intact via tuning fork bilateral. Protective threshold with Semmes Wienstein monofilament intact to all pedal sites bilateral. Patellar and Achilles deep tendon reflexes 2+ bilateral. No Babinski or clonus noted bilateral.   Musculoskeletal: No gross boney pedal deformities bilateral. No pain, crepitus, or limitation noted with foot and ankle range of motion bilateral. Muscular strength 5/5 in all groups tested bilateral.  Gait: Unassisted, Nonantalgic.    Radiographs:  None taken  Assessment & Plan:   Assessment: Ingrown toenail tibial border hallux left.  Plan: Chemical matricectomy was performed today after local anesthetic was administered he tolerated procedure well without complications. Was provided both oral and written home-going structure for the care and soaking of the toe as well as a prescription for Cortisporin Otic to be applied twice daily after  soaking. We will follow-up with him in 2 weeks for reevaluation.     Belita Warsame T. [place], [place]

## 2019-08-05 ENCOUNTER — Ambulatory Visit (INDEPENDENT_AMBULATORY_CARE_PROVIDER_SITE_OTHER): Payer: Medicare Other | Admitting: Podiatry

## 2019-08-05 ENCOUNTER — Other Ambulatory Visit: Payer: Self-pay

## 2019-08-05 DIAGNOSIS — L600 Ingrowing nail: Secondary | ICD-10-CM

## 2019-08-05 DIAGNOSIS — Z9889 Other specified postprocedural states: Secondary | ICD-10-CM

## 2019-08-05 NOTE — Progress Notes (Signed)
He presents today for follow-up of his ingrown toenail hallux left.  States that has been soaking and has had absolutely no pain whatsoever.  Denies any problems with this at all.  Objective: Vital signs are stable he is alert oriented x3 there is no erythema edema cellulitis drainage or odor.  There is no signs of infection no tenderness on palpation.  Assessment: Well-healing surgical toe.  I recommended he continue soak every other day Epson salts and warm water just until the margin is closed well.  I will follow-up with him if should he have questions or concerns.

## 2019-08-06 ENCOUNTER — Other Ambulatory Visit: Payer: Self-pay | Admitting: Internal Medicine

## 2019-08-21 ENCOUNTER — Other Ambulatory Visit: Payer: Self-pay | Admitting: Internal Medicine

## 2019-09-11 ENCOUNTER — Other Ambulatory Visit: Payer: Self-pay | Admitting: Internal Medicine

## 2019-09-23 ENCOUNTER — Other Ambulatory Visit: Payer: Self-pay | Admitting: Internal Medicine

## 2019-09-24 ENCOUNTER — Encounter: Payer: Self-pay | Admitting: Internal Medicine

## 2019-09-24 ENCOUNTER — Other Ambulatory Visit: Payer: Self-pay

## 2019-09-24 ENCOUNTER — Ambulatory Visit (INDEPENDENT_AMBULATORY_CARE_PROVIDER_SITE_OTHER): Payer: Medicare Other | Admitting: Internal Medicine

## 2019-09-24 DIAGNOSIS — F332 Major depressive disorder, recurrent severe without psychotic features: Secondary | ICD-10-CM

## 2019-09-24 DIAGNOSIS — M5442 Lumbago with sciatica, left side: Secondary | ICD-10-CM | POA: Diagnosis not present

## 2019-09-24 DIAGNOSIS — M5441 Lumbago with sciatica, right side: Secondary | ICD-10-CM

## 2019-09-24 DIAGNOSIS — E559 Vitamin D deficiency, unspecified: Secondary | ICD-10-CM

## 2019-09-24 DIAGNOSIS — I2510 Atherosclerotic heart disease of native coronary artery without angina pectoris: Secondary | ICD-10-CM

## 2019-09-24 DIAGNOSIS — G8929 Other chronic pain: Secondary | ICD-10-CM

## 2019-09-24 DIAGNOSIS — E538 Deficiency of other specified B group vitamins: Secondary | ICD-10-CM | POA: Diagnosis not present

## 2019-09-24 DIAGNOSIS — E669 Obesity, unspecified: Secondary | ICD-10-CM

## 2019-09-24 MED ORDER — OXYCODONE HCL 10 MG PO TABS: 10.0000 mg | ORAL_TABLET | Freq: Three times a day (TID) | ORAL | 0 refills | Status: DC | PRN

## 2019-09-24 MED ORDER — FENTANYL 25 MCG/HR TD PT72: 1.0000 | MEDICATED_PATCH | TRANSDERMAL | 0 refills | Status: DC

## 2019-09-24 NOTE — Assessment & Plan Note (Signed)
B complex

## 2019-09-24 NOTE — Assessment & Plan Note (Signed)
Lovastatin, aspirin

## 2019-09-24 NOTE — Telephone Encounter (Signed)
[place] Controlled Database Checked Last filled: 08/12/2019 (90) LOV w/you: 06/24/2019 Next appt w/you: today

## 2019-09-24 NOTE — Progress Notes (Signed)
Subjective:  Patient ID: Lee Owens, male    DOB: 1950/10/06  Age: 69 y.o. MRN: [id number]  CC: No chief complaint on file.   HPI Lee Owens presents for chronic pain, spasms, depression, CAD f/u  Outpatient Medications Prior to Visit  Medication Sig Dispense Refill  . allopurinol (ZYLOPRIM) 300 MG tablet TAKE 1 TABLET(300 MG) BY MOUTH DAILY 90 tablet 3  . buPROPion (WELLBUTRIN XL) 150 MG 24 hr tablet TAKE 2 TABLETS(300 MG) BY MOUTH DAILY 180 tablet 1  . Cholecalciferol (VITAMIN D-3) 5000 UNITS TABS Take 5,000 Units by mouth daily.    . Coenzyme Q10 (COQ10) 400 MG CAPS 1 po qd 100 capsule 3  . diazepam (VALIUM) 10 MG tablet Take 0.5-1 tablets (5-10 mg total) by mouth every 8 (eight) hours as needed for anxiety (muscle spasms). 90 tablet 2  . DULoxetine (CYMBALTA) 60 MG capsule TAKE 1 CAPSULE BY MOUTH TWICE DAILY 180 capsule 2  . fentaNYL (DURAGESIC) 25 MCG/HR Place 1 patch onto the skin every other day. 15 patch 0  . finasteride (PROSCAR) 5 MG tablet Take 5 mg by mouth daily.    Marland Kitchen gabapentin (NEURONTIN) 300 MG capsule TAKE 1 CAPSULE(300 MG) BY MOUTH AT BEDTIME 90 capsule 2  . L-Methylfolate-Algae 15-90.314 MG CAPS TAKE 1 CAPSULE BY MOUTH ONCE DAILY 30 capsule 11  . lovastatin (MEVACOR) 40 MG tablet TAKE 1 TABLET(40 MG) BY MOUTH AT BEDTIME 90 tablet 3  . NARCAN 4 MG/0.1ML LIQD nasal spray kit as needed.  0  . NEOMYCIN-POLYMYXIN-HYDROCORTISONE (CORTISPORIN) 1 % SOLN OTIC solution Apply 1-2 drops to toe BID after soaking 10 mL 1  . Oxycodone HCl 10 MG TABS Take 1 tablet (10 mg total) by mouth 3 (three) times daily as needed. 90 tablet 0  . tamsulosin (FLOMAX) 0.4 MG CAPS capsule Take 1 capsule by mouth daily.  2  . temazepam (RESTORIL) 30 MG capsule TAKE 1 TO 2 CAPSULES BY MOUTH AT BEDTIME AS NEEDED FOR SLEEP 180 capsule 1  . vitamin B-12 (CYANOCOBALAMIN) 1000 MCG tablet Take 2,000 mcg by mouth daily.     . Vitamin D, Ergocalciferol, (DRISDOL) 1.25 MG (50000 UT) CAPS capsule TAKE 1  CAPSULE BY MOUTH EVERY 7 DAYS 12 capsule 0   No facility-administered medications prior to visit.    ROS: Review of Systems  Constitutional: Negative for appetite change, fatigue and unexpected weight change.  HENT: Negative for congestion, nosebleeds, sneezing, sore throat and trouble swallowing.   Eyes: Negative for itching and visual disturbance.  Respiratory: Negative for cough.   Cardiovascular: Negative for chest pain, palpitations and leg swelling.  Gastrointestinal: Negative for abdominal distention, blood in stool, diarrhea and nausea.  Genitourinary: Negative for frequency and hematuria.  Musculoskeletal: Positive for arthralgias, back pain, gait problem, myalgias, neck pain and neck stiffness. Negative for joint swelling.  Skin: Negative for rash.  Neurological: Negative for dizziness, tremors, speech difficulty and weakness.  Psychiatric/Behavioral: Positive for decreased concentration and dysphoric mood. Negative for agitation, sleep disturbance and suicidal ideas. The patient is not nervous/anxious.     Objective:  BP 140/80 (BP Location: Left Arm, Patient Position: Sitting, Cuff Size: Large)   Pulse 92   Temp 98.3 F (36.8 C) (Oral)   Ht 6' 1"  (1.854 m)   Wt 228 lb (103.4 kg)   SpO2 95%   BMI 30.08 kg/m   BP Readings from Last 3 Encounters:  09/24/19 140/80  07/22/19 (!) 146/93  06/24/19 130/84    Wt Readings from Last  3 Encounters:  09/24/19 228 lb (103.4 kg)  06/24/19 233 lb (105.7 kg)  05/05/19 230 lb (104.3 kg)    Physical Exam Constitutional:      General: He is not in acute distress.    Appearance: He is well-developed.     Comments: NAD  Eyes:     Conjunctiva/sclera: Conjunctivae normal.     Pupils: Pupils are equal, round, and reactive to light.  Neck:     Thyroid: No thyromegaly.     Vascular: No JVD.  Cardiovascular:     Rate and Rhythm: Normal rate and regular rhythm.     Heart sounds: Normal heart sounds. No murmur heard.  No  friction rub. No gallop.   Pulmonary:     Effort: Pulmonary effort is normal. No respiratory distress.     Breath sounds: Normal breath sounds. No wheezing or rales.  Chest:     Chest wall: No tenderness.  Abdominal:     General: Bowel sounds are normal. There is no distension.     Palpations: Abdomen is soft. There is no mass.     Tenderness: There is no abdominal tenderness. There is no guarding or rebound.  Musculoskeletal:        General: Tenderness present. Normal range of motion.     Cervical back: Normal range of motion.  Lymphadenopathy:     Cervical: No cervical adenopathy.  Skin:    General: Skin is warm and dry.     Findings: No rash.  Neurological:     Mental Status: He is alert and oriented to person, place, and time.     Cranial Nerves: No cranial nerve deficit.     Motor: No abnormal muscle tone.     Coordination: Coordination normal.     Gait: Gait abnormal.     Deep Tendon Reflexes: Reflexes are normal and symmetric.  Psychiatric:        Behavior: Behavior normal.        Thought Content: Thought content normal.        Judgment: Judgment normal.    Painful muscles, stiff   Lab Results  Component Value Date   WBC 6.9 09/18/2018   HGB 15.4 09/18/2018   HCT 45.2 09/18/2018   PLT 225.0 09/18/2018   GLUCOSE 105 (H) 04/22/2019   CHOL 150 04/22/2019   TRIG 94 04/22/2019   HDL 51 04/22/2019   LDLCALC 81 04/22/2019   ALT 27 09/18/2018   AST 21 09/18/2018   NA 139 04/22/2019   K 4.8 04/22/2019   CL 100 04/22/2019   CREATININE 1.07 04/22/2019   BUN 11 04/22/2019   CO2 23 04/22/2019   TSH 1.45 09/18/2018   PSA 0.76 06/29/2016   INR 0.96 06/30/2013   HGBA1C 5.5 09/26/2018    CT ANGIO CHEST AORTA W &/OR WO CONTRAST  Result Date: 04/30/2019 CLINICAL DATA:  Thoracic aorta follow-up. EXAM: CT ANGIOGRAPHY CHEST WITH CONTRAST TECHNIQUE: Multidetector CT imaging of the chest was performed using the standard protocol during bolus administration of intravenous  contrast. Multiplanar CT image reconstructions and MIPs were obtained to evaluate the vascular anatomy. CONTRAST:  130m OMNIPAQUE IOHEXOL 350 MG/ML SOLN COMPARISON:  CT dated 05/21/2018. FINDINGS: Cardiovascular: The ascending thoracic aorta is aneurysmal measuring up to approximately 4.1 cm in diameter (axial series 4, image 66). There is no evidence for thoracic aortic dissection or aneurysm. Minimal atherosclerotic changes are noted of the thoracic aorta. Mild coronary artery calcifications are noted. The heart size is normal. No acute  pulmonary embolism was detected on this exam. There is no significant pericardial effusion. Mediastinum/Nodes: --No mediastinal or hilar lymphadenopathy. --No axillary lymphadenopathy. --No supraclavicular lymphadenopathy. --Normal thyroid gland. --The esophagus is unremarkable Lungs/Pleura: No pulmonary nodules or masses. No pleural effusion or pneumothorax. No focal airspace consolidation. No focal pleural abnormality. Upper Abdomen: No acute abnormality. Musculoskeletal: No chest wall abnormality. No acute or significant osseous findings. Review of the MIP images confirms the above findings. IMPRESSION: 1. 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 Recommend annual imaging followup by CTA or MRA. This recommendation follows 2010 ACCF/AHA/AATS/ACR/ASA/SCA/SCAI/SIR/STS/SVM Guidelines for the Diagnosis and Management of Patients with Thoracic Aortic Disease. Circulation. 2010; 121: A891-Q026. Aortic aneurysm NOS (ICD10-I71.9) 2. No acute abnormality. No evidence for thoracic aortic dissection. No large centrally located pulmonary embolism. Aortic Atherosclerosis (ICD10-I70.0). Electronically Signed   By: Constance Holster M.D.   On: 04/30/2019 23:28    Assessment & Plan:    Follow-up: No follow-ups on file.  Walker Kehr, MD

## 2019-09-24 NOTE — Assessment & Plan Note (Signed)
Duragesic, Oxy IR  Potential benefits of a long term opioids use as well as potential risks (i.e. addiction risk, apnea etc) and complications (i.e. Somnolence, constipation and others) were explained to the patient and were aknowledged. Diazepam for muscle spasms.  Not to be taken together with opioids Water exercises, dry needling, PT

## 2019-09-24 NOTE — Assessment & Plan Note (Signed)
Vit D 

## 2019-09-24 NOTE — Assessment & Plan Note (Signed)
Cymbalta 

## 2019-09-24 NOTE — Assessment & Plan Note (Signed)
[name] lost wt

## 2019-09-27 ENCOUNTER — Other Ambulatory Visit: Payer: Self-pay | Admitting: Internal Medicine

## 2019-11-17 ENCOUNTER — Telehealth: Payer: Self-pay | Admitting: Internal Medicine

## 2019-11-17 NOTE — Telephone Encounter (Signed)
Patient's wife called and states the pharmacy is out of 10 mg and provider would need to call in the RX as 5mg .   Oxycodone HCl 10 MG TABS   Pharmacy on file.

## 2019-11-19 DIAGNOSIS — N401 Enlarged prostate with lower urinary tract symptoms: Secondary | ICD-10-CM | POA: Diagnosis not present

## 2019-11-19 DIAGNOSIS — N5201 Erectile dysfunction due to arterial insufficiency: Secondary | ICD-10-CM | POA: Diagnosis not present

## 2019-11-19 DIAGNOSIS — R3914 Feeling of incomplete bladder emptying: Secondary | ICD-10-CM | POA: Diagnosis not present

## 2019-11-19 DIAGNOSIS — R972 Elevated prostate specific antigen [PSA]: Secondary | ICD-10-CM | POA: Diagnosis not present

## 2019-11-20 NOTE — Telephone Encounter (Signed)
He should have one for 11/25/19 Thx

## 2019-11-21 MED ORDER — OXYCODONE HCL 5 MG PO TABS: 10.0000 mg | ORAL_TABLET | Freq: Three times a day (TID) | ORAL | 0 refills | Status: DC | PRN

## 2019-11-21 NOTE — Telephone Encounter (Signed)
rx sent

## 2019-11-21 NOTE — Telephone Encounter (Signed)
Follow up message  Spouse calling for status 5MG  tablet

## 2019-11-21 NOTE — Telephone Encounter (Signed)
Wife, Lee Owens, states that the pharmacy does not have any oxycodone 10mg  & the prescription would need to be changed to 5mg .  Pt aware that prescription cannot be refilled until 11/25/19.  Dr Camila Li out of office, will send to another provider for refill.  Per controlled sub website last RF: 09/24/19 (picked up 10/25/19) Last OV: 09/24/19 Next OV: 12/25/19

## 2019-12-24 ENCOUNTER — Other Ambulatory Visit: Payer: Self-pay | Admitting: Internal Medicine

## 2019-12-25 ENCOUNTER — Encounter: Payer: Self-pay | Admitting: Internal Medicine

## 2019-12-25 ENCOUNTER — Other Ambulatory Visit: Payer: Self-pay

## 2019-12-25 ENCOUNTER — Ambulatory Visit (INDEPENDENT_AMBULATORY_CARE_PROVIDER_SITE_OTHER): Payer: Medicare Other | Admitting: Internal Medicine

## 2019-12-25 VITALS — BP 140/96 | HR 67 | Temp 98.1°F | Ht 73.0 in | Wt 233.0 lb

## 2019-12-25 DIAGNOSIS — G8929 Other chronic pain: Secondary | ICD-10-CM | POA: Diagnosis not present

## 2019-12-25 DIAGNOSIS — E538 Deficiency of other specified B group vitamins: Secondary | ICD-10-CM

## 2019-12-25 DIAGNOSIS — I2510 Atherosclerotic heart disease of native coronary artery without angina pectoris: Secondary | ICD-10-CM

## 2019-12-25 DIAGNOSIS — M791 Myalgia, unspecified site: Secondary | ICD-10-CM

## 2019-12-25 DIAGNOSIS — Z23 Encounter for immunization: Secondary | ICD-10-CM | POA: Diagnosis not present

## 2019-12-25 DIAGNOSIS — M5441 Lumbago with sciatica, right side: Secondary | ICD-10-CM | POA: Diagnosis not present

## 2019-12-25 DIAGNOSIS — E559 Vitamin D deficiency, unspecified: Secondary | ICD-10-CM | POA: Diagnosis not present

## 2019-12-25 DIAGNOSIS — M5442 Lumbago with sciatica, left side: Secondary | ICD-10-CM | POA: Diagnosis not present

## 2019-12-25 DIAGNOSIS — F332 Major depressive disorder, recurrent severe without psychotic features: Secondary | ICD-10-CM

## 2019-12-25 MED ORDER — FENTANYL 25 MCG/HR TD PT72: 1.0000 | MEDICATED_PATCH | TRANSDERMAL | 0 refills | Status: DC

## 2019-12-25 MED ORDER — OXYCODONE HCL 5 MG PO TABS: 10.0000 mg | ORAL_TABLET | Freq: Three times a day (TID) | ORAL | 0 refills | Status: DC | PRN

## 2019-12-25 NOTE — Assessment & Plan Note (Signed)
Vit D 

## 2019-12-25 NOTE — Assessment & Plan Note (Signed)
Lovastatin, ASA

## 2019-12-25 NOTE — Progress Notes (Signed)
Subjective:  Patient ID: [name], male    DOB: 10-Jan-1951  Age: 69 y.o. MRN: [id number]  CC: No chief complaint on file.   HPI Jaliel Deavers presents for chronic pain, muscle spasms, gout, CAD f/u  Outpatient Medications Prior to Visit  Medication Sig Dispense Refill  . allopurinol (ZYLOPRIM) 300 MG tablet TAKE 1 TABLET(300 MG) BY MOUTH DAILY 90 tablet 3  . buPROPion (WELLBUTRIN XL) 150 MG 24 hr tablet TAKE 2 TABLETS(300 MG) BY MOUTH DAILY 180 tablet 1  . Cholecalciferol (VITAMIN D-3) 5000 UNITS TABS Take 5,000 Units by mouth daily.    . Coenzyme Q10 (COQ10) 400 MG CAPS 1 po qd 100 capsule 3  . diazepam (VALIUM) 10 MG tablet TAKE 1/2 TO 1 TABLET(5 TO 10 MG) BY MOUTH EVERY 8 HOURS AS NEEDED FOR ANXIETY OR MUSCLE SPASMS 90 tablet 3  . DULoxetine (CYMBALTA) 60 MG capsule TAKE 1 CAPSULE BY MOUTH TWICE DAILY 180 capsule 2  . fentaNYL (DURAGESIC) 25 MCG/HR Place 1 patch onto the skin every other day. 15 patch 0  . fentaNYL (DURAGESIC) 25 MCG/HR Place 1 patch onto the skin every other day. 15 patch 0  . fentaNYL (DURAGESIC) 25 MCG/HR Place 1 patch onto the skin every other day. 15 patch 0  . finasteride (PROSCAR) 5 MG tablet Take 5 mg by mouth daily.    Marland Kitchen gabapentin (NEURONTIN) 300 MG capsule TAKE 1 CAPSULE(300 MG) BY MOUTH AT BEDTIME 90 capsule 2  . L-Methylfolate-Algae 15-90.314 MG CAPS TAKE 1 CAPSULE BY MOUTH ONCE DAILY 30 capsule 11  . lovastatin (MEVACOR) 40 MG tablet TAKE 1 TABLET(40 MG) BY MOUTH AT BEDTIME 90 tablet 3  . NARCAN 4 MG/0.1ML LIQD nasal spray kit as needed.  0  . NEOMYCIN-POLYMYXIN-HYDROCORTISONE (CORTISPORIN) 1 % SOLN OTIC solution Apply 1-2 drops to toe BID after soaking 10 mL 1  . oxyCODONE (OXY IR/ROXICODONE) 5 MG immediate release tablet Take 2 tablets (10 mg total) by mouth every 8 (eight) hours as needed for severe pain (chronic back pain). 180 tablet 0  . Oxycodone HCl 10 MG TABS Take 1 tablet (10 mg total) by mouth 3 (three) times daily as needed. 90 tablet 0    . Oxycodone HCl 10 MG TABS Take 1 tablet (10 mg total) by mouth every 8 (eight) hours as needed. 90 tablet 0  . Oxycodone HCl 10 MG TABS Take 1 tablet (10 mg total) by mouth 3 (three) times daily as needed. 90 tablet 0  . tamsulosin (FLOMAX) 0.4 MG CAPS capsule Take 1 capsule by mouth daily.  2  . temazepam (RESTORIL) 30 MG capsule TAKE 1 TO 2 CAPSULES BY MOUTH AT BEDTIME AS NEEDED FOR SLEEP 180 capsule 1  . vitamin B-12 (CYANOCOBALAMIN) 1000 MCG tablet Take 2,000 mcg by mouth daily.     . Vitamin D, Ergocalciferol, (DRISDOL) 1.25 MG (50000 UT) CAPS capsule TAKE 1 CAPSULE BY MOUTH EVERY 7 DAYS 12 capsule 0   No facility-administered medications prior to visit.    ROS: Review of Systems  Constitutional: Negative for appetite change, fatigue and unexpected weight change.  HENT: Negative for congestion, nosebleeds, sneezing, sore throat and trouble swallowing.   Eyes: Negative for itching and visual disturbance.  Respiratory: Negative for cough.   Cardiovascular: Negative for chest pain, palpitations and leg swelling.  Gastrointestinal: Negative for abdominal distention, blood in stool, diarrhea and nausea.  Genitourinary: Negative for frequency and hematuria.  Musculoskeletal: Positive for arthralgias, back pain, gait problem and myalgias. Negative for  joint swelling and neck pain.  Skin: Negative for rash.  Neurological: Negative for dizziness, tremors, speech difficulty and weakness.  Psychiatric/Behavioral: Negative for agitation, dysphoric mood and sleep disturbance. The patient is not nervous/anxious.     Objective:  BP (!) 140/96 (BP Location: Right Arm, Patient Position: Sitting, Cuff Size: Large)   Pulse 67   Temp 98.1 F (36.7 C) (Oral)   Ht 6' 1"  (1.854 m)   Wt 233 lb (105.7 kg)   SpO2 95%   BMI 30.74 kg/m   BP Readings from Last 3 Encounters:  12/25/19 (!) 140/96  09/24/19 140/80  07/22/19 (!) 146/93    Wt Readings from Last 3 Encounters:  12/25/19 233 lb (105.7  kg)  09/24/19 228 lb (103.4 kg)  06/24/19 233 lb (105.7 kg)    Physical Exam Constitutional:      General: He is not in acute distress.    Appearance: He is well-developed.     Comments: NAD  Eyes:     Conjunctiva/sclera: Conjunctivae normal.     Pupils: Pupils are equal, round, and reactive to light.  Neck:     Thyroid: No thyromegaly.     Vascular: No JVD.  Cardiovascular:     Rate and Rhythm: Normal rate and regular rhythm.     Heart sounds: Normal heart sounds. No murmur heard.  No friction rub. No gallop.   Pulmonary:     Effort: Pulmonary effort is normal. No respiratory distress.     Breath sounds: Normal breath sounds. No wheezing or rales.  Chest:     Chest wall: No tenderness.  Abdominal:     General: Bowel sounds are normal. There is no distension.     Palpations: Abdomen is soft. There is no mass.     Tenderness: There is no abdominal tenderness. There is no guarding or rebound.  Musculoskeletal:        General: Tenderness present. Normal range of motion.     Cervical back: Normal range of motion.  Lymphadenopathy:     Cervical: No cervical adenopathy.  Skin:    General: Skin is warm and dry.     Findings: No rash.  Neurological:     Mental Status: He is alert and oriented to person, place, and time.     Cranial Nerves: No cranial nerve deficit.     Motor: No abnormal muscle tone.     Coordination: Coordination normal.     Gait: Gait abnormal.     Deep Tendon Reflexes: Reflexes are normal and symmetric.  Psychiatric:        Behavior: Behavior normal.        Thought Content: Thought content normal.        Judgment: Judgment normal.     Lab Results  Component Value Date   WBC 6.9 09/18/2018   HGB 15.4 09/18/2018   HCT 45.2 09/18/2018   PLT 225.0 09/18/2018   GLUCOSE 105 (H) 04/22/2019   CHOL 150 04/22/2019   TRIG 94 04/22/2019   HDL 51 04/22/2019   LDLCALC 81 04/22/2019   ALT 27 09/18/2018   AST 21 09/18/2018   NA 139 04/22/2019   K 4.8  04/22/2019   CL 100 04/22/2019   CREATININE 1.07 04/22/2019   BUN 11 04/22/2019   CO2 23 04/22/2019   TSH 1.45 09/18/2018   PSA 0.76 06/29/2016   INR 0.96 06/30/2013   HGBA1C 5.5 09/26/2018    CT ANGIO CHEST AORTA W &/OR WO CONTRAST  Result Date:  04/30/2019 CLINICAL DATA:  Thoracic aorta follow-up. EXAM: CT ANGIOGRAPHY CHEST WITH CONTRAST TECHNIQUE: Multidetector CT imaging of the chest was performed using the standard protocol during bolus administration of intravenous contrast. Multiplanar CT image reconstructions and MIPs were obtained to evaluate the vascular anatomy. CONTRAST:  112m OMNIPAQUE IOHEXOL 350 MG/ML SOLN COMPARISON:  CT dated 05/21/2018. FINDINGS: Cardiovascular: The ascending thoracic aorta is aneurysmal measuring up to approximately 4.1 cm in diameter (axial series 4, image 66). There is no evidence for thoracic aortic dissection or aneurysm. Minimal atherosclerotic changes are noted of the thoracic aorta. Mild coronary artery calcifications are noted. The heart size is normal. No acute pulmonary embolism was detected on this exam. There is no significant pericardial effusion. Mediastinum/Nodes: --No mediastinal or hilar lymphadenopathy. --No axillary lymphadenopathy. --No supraclavicular lymphadenopathy. --Normal thyroid gland. --The esophagus is unremarkable Lungs/Pleura: No pulmonary nodules or masses. No pleural effusion or pneumothorax. No focal airspace consolidation. No focal pleural abnormality. Upper Abdomen: No acute abnormality. Musculoskeletal: No chest wall abnormality. No acute or significant osseous findings. Review of the MIP images confirms the above findings. IMPRESSION: 1. 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 Recommend annual imaging followup by CTA or MRA. This recommendation follows 2010 ACCF/AHA/AATS/ACR/ASA/SCA/SCAI/SIR/STS/SVM Guidelines for the Diagnosis and Management of Patients with Thoracic Aortic Disease.  Circulation. 2010; 121:: [id number] Aortic aneurysm NOS (ICD10-I71.9) 2. No acute abnormality. No evidence for thoracic aortic dissection. No large centrally located pulmonary embolism. Aortic Atherosclerosis (ICD10-I70.0). Electronically Signed   By: CConstance HolsterM.D.   On: 04/30/2019 23:28    Assessment & Plan:    AWalker Kehr MD

## 2019-12-25 NOTE — Assessment & Plan Note (Signed)
Duragesic, Oxy IR  Potential benefits of a long term opioids use as well as potential risks (i.e. addiction risk, apnea etc) and complications (i.e. Somnolence, constipation and others) were explained to the patient and were aknowledged. Diazepam for muscle spasms.  Not to be taken together with opioids Water exercises, dry needling, PT Narcan prescription

## 2019-12-25 NOTE — Assessment & Plan Note (Signed)
Duragesic, Oxy IR  Potential benefits of a long term opioids use as well as potential risks (i.e. addiction risk, apnea etc) and complications (i.e. Somnolence, constipation and others) were explained to the patient and were aknowledged. Diazepam for muscle spasms.  Not to be taken together with opioids 

## 2019-12-25 NOTE — Assessment & Plan Note (Addendum)
Chronic  Cymbalta and Wellbutrin

## 2019-12-25 NOTE — Assessment & Plan Note (Signed)
On B12 

## 2020-01-05 ENCOUNTER — Other Ambulatory Visit: Payer: Self-pay | Admitting: Internal Medicine

## 2020-01-06 ENCOUNTER — Ambulatory Visit: Payer: Medicare Other | Attending: Critical Care Medicine

## 2020-01-06 DIAGNOSIS — Z23 Encounter for immunization: Secondary | ICD-10-CM

## 2020-01-06 NOTE — Progress Notes (Signed)
   Covid-19 Vaccination Clinic  Name:  Lee Owens    MRN: [id number] DOB: [birth year]  01/06/2020  Mr. Lee Owens was observed post Covid-19 immunization for 15 minutes without incident. He was provided with Vaccine Information Sheet and instruction to access the V-Safe system.   Mr. Lee Owens was instructed to call 911 with any severe reactions post vaccine: Lee Owens Kitchen Difficulty breathing  . Swelling of face and throat  . A fast heartbeat  . A bad rash all over body  . Dizziness and weakness      Covid-19 Vaccination Clinic  Name:  Lee Owens    MRN: [id number] DOB: 10-Jan-1951  01/06/2020  Mr. Lee Owens was observed post Covid-19 immunization for 15 minutes without incident. He was provided with Vaccine Information Sheet and instruction to access the V-Safe system.   Mr. Lee Owens was instructed to call 911 with any severe reactions post vaccine: Lee Owens Kitchen Difficulty breathing  . Swelling of face and throat  . A fast heartbeat  . A bad rash all over body  . Dizziness and weakness

## 2020-02-02 DIAGNOSIS — D1801 Hemangioma of skin and subcutaneous tissue: Secondary | ICD-10-CM | POA: Diagnosis not present

## 2020-02-02 DIAGNOSIS — L918 Other hypertrophic disorders of the skin: Secondary | ICD-10-CM | POA: Diagnosis not present

## 2020-02-02 DIAGNOSIS — L821 Other seborrheic keratosis: Secondary | ICD-10-CM | POA: Diagnosis not present

## 2020-02-02 DIAGNOSIS — D225 Melanocytic nevi of trunk: Secondary | ICD-10-CM | POA: Diagnosis not present

## 2020-02-23 ENCOUNTER — Telehealth: Payer: Self-pay | Admitting: Internal Medicine

## 2020-02-23 NOTE — Telephone Encounter (Signed)
Check [name] registry last filled both 01/24/2020.Marland KitchenJohny Owens

## 2020-02-23 NOTE — Telephone Encounter (Signed)
1.Medication Requested: oxyCODONE (OXY IR/ROXICODONE) 5 MG immediate release tablet fentaNYL (DURAGESIC) 25 MCG/HR 2. Pharmacy (Name, [facility]): [facility], [address] AT [address] Phone:  [phone]  Fax:  [phone]     3. On Med List: yes 4. Last Visit with PCP: 09.30.21 5. Next visit date with PCP: 12.28.21  Patient is out of medication.

## 2020-02-24 MED ORDER — OXYCODONE HCL 10 MG PO TABS: 10.0000 mg | ORAL_TABLET | Freq: Three times a day (TID) | ORAL | 0 refills | Status: DC | PRN

## 2020-02-24 MED ORDER — FENTANYL 25 MCG/HR TD PT72: 1.0000 | MEDICATED_PATCH | TRANSDERMAL | 0 refills | Status: DC

## 2020-02-24 NOTE — Telephone Encounter (Signed)
Okay.  Done.  Thanks 

## 2020-02-24 NOTE — Telephone Encounter (Signed)
[facility] calling stating they are out of the 10 mg, and wondering if it was okay to use the 5 mg tablets this time.  [facility]- [phone]

## 2020-02-26 NOTE — Telephone Encounter (Signed)
Notified pharmacy spoke w/rep she states MD already sent 5 mg and pt pick med up.Marland KitchenJohny Chess

## 2020-02-26 NOTE — Telephone Encounter (Signed)
Ok Thx 

## 2020-03-09 ENCOUNTER — Other Ambulatory Visit: Payer: Self-pay | Admitting: Internal Medicine

## 2020-03-10 ENCOUNTER — Other Ambulatory Visit: Payer: Self-pay | Admitting: Internal Medicine

## 2020-03-10 MED ORDER — BUPROPION HCL ER (XL) 150 MG PO TB24: ORAL_TABLET | ORAL | 0 refills | Status: DC

## 2020-03-10 NOTE — Addendum Note (Signed)
Addended by: Earnstine Regal on: 03/10/2020 02:10 PM   Modules accepted: Orders

## 2020-03-19 ENCOUNTER — Other Ambulatory Visit: Payer: Self-pay | Admitting: Internal Medicine

## 2020-03-22 ENCOUNTER — Other Ambulatory Visit: Payer: Self-pay

## 2020-03-23 ENCOUNTER — Ambulatory Visit (INDEPENDENT_AMBULATORY_CARE_PROVIDER_SITE_OTHER): Payer: Medicare Other | Admitting: Internal Medicine

## 2020-03-23 ENCOUNTER — Encounter: Payer: Self-pay | Admitting: Internal Medicine

## 2020-03-23 DIAGNOSIS — E559 Vitamin D deficiency, unspecified: Secondary | ICD-10-CM

## 2020-03-23 DIAGNOSIS — G8929 Other chronic pain: Secondary | ICD-10-CM | POA: Diagnosis not present

## 2020-03-23 DIAGNOSIS — I2510 Atherosclerotic heart disease of native coronary artery without angina pectoris: Secondary | ICD-10-CM

## 2020-03-23 DIAGNOSIS — E538 Deficiency of other specified B group vitamins: Secondary | ICD-10-CM | POA: Diagnosis not present

## 2020-03-23 DIAGNOSIS — M791 Myalgia, unspecified site: Secondary | ICD-10-CM | POA: Diagnosis not present

## 2020-03-23 DIAGNOSIS — M5441 Lumbago with sciatica, right side: Secondary | ICD-10-CM | POA: Diagnosis not present

## 2020-03-23 DIAGNOSIS — E785 Hyperlipidemia, unspecified: Secondary | ICD-10-CM | POA: Diagnosis not present

## 2020-03-23 DIAGNOSIS — M5442 Lumbago with sciatica, left side: Secondary | ICD-10-CM | POA: Diagnosis not present

## 2020-03-23 MED ORDER — OXYCODONE HCL 5 MG PO TABS: 10.0000 mg | ORAL_TABLET | Freq: Three times a day (TID) | ORAL | 0 refills | Status: DC | PRN

## 2020-03-23 MED ORDER — FENTANYL 25 MCG/HR TD PT72: 1.0000 | MEDICATED_PATCH | TRANSDERMAL | 0 refills | Status: DC

## 2020-03-23 NOTE — Assessment & Plan Note (Signed)
On Vit D 

## 2020-03-23 NOTE — Assessment & Plan Note (Signed)
Lovastatin, aspirin 

## 2020-03-23 NOTE — Progress Notes (Signed)
Subjective:  Patient ID: Lee Owens, male    DOB: 1951/02/02  Age: 69 y.o. MRN: [id number]  CC: Follow-up   HPI Raylon Lamson presents for chronic pain, muscle pain, depression f/u  Outpatient Medications Prior to Visit  Medication Sig Dispense Refill  . allopurinol (ZYLOPRIM) 300 MG tablet TAKE 1 TABLET(300 MG) BY MOUTH DAILY 90 tablet 3  . buPROPion (WELLBUTRIN XL) 150 MG 24 hr tablet TAKE 2 TABLETS(300 MG) BY MOUTH DAILY- KEEP SCHEDULED APPT OR REFILLS 60 tablet 0  . Cholecalciferol (VITAMIN D-3) 5000 UNITS TABS Take 5,000 Units by mouth daily.    . Coenzyme Q10 (COQ10) 400 MG CAPS 1 po qd 100 capsule 3  . diazepam (VALIUM) 10 MG tablet TAKE 1/2 TO 1 TABLET(5 TO 10 MG) BY MOUTH EVERY 8 HOURS AS NEEDED FOR ANXIETY OR MUSCLE SPASMS 90 tablet 1  . DULoxetine (CYMBALTA) 60 MG capsule TAKE 1 CAPSULE BY MOUTH TWICE DAILY 180 capsule 2  . fentaNYL (DURAGESIC) 25 MCG/HR Place 1 patch onto the skin every other day. 15 patch 0  . fentaNYL (DURAGESIC) 25 MCG/HR Place 1 patch onto the skin every other day. 15 patch 0  . fentaNYL (DURAGESIC) 25 MCG/HR Place 1 patch onto the skin every other day. 15 patch 0  . finasteride (PROSCAR) 5 MG tablet Take 5 mg by mouth daily.    Marland Kitchen gabapentin (NEURONTIN) 300 MG capsule TAKE 1 CAPSULE(300 MG) BY MOUTH AT BEDTIME 90 capsule 2  . L-Methylfolate-Algae 15-90.314 MG CAPS TAKE 1 CAPSULE BY MOUTH ONCE DAILY 30 capsule 11  . lovastatin (MEVACOR) 40 MG tablet Take 1 tablet (40 mg total) by mouth at bedtime. Must keep scheduled appt for future refills 30 tablet 0  . NARCAN 4 MG/0.1ML LIQD nasal spray kit as needed.  0  . NEOMYCIN-POLYMYXIN-HYDROCORTISONE (CORTISPORIN) 1 % SOLN OTIC solution Apply 1-2 drops to toe BID after soaking 10 mL 1  . oxyCODONE (OXY IR/ROXICODONE) 5 MG immediate release tablet Take 2 tablets (10 mg total) by mouth every 8 (eight) hours as needed for severe pain (chronic back pain). 180 tablet 0  . Oxycodone HCl 10 MG TABS Take 1 tablet (10  mg total) by mouth every 8 (eight) hours as needed. 90 tablet 0  . Oxycodone HCl 10 MG TABS Take 1 tablet (10 mg total) by mouth 3 (three) times daily as needed. 90 tablet 0  . Oxycodone HCl 10 MG TABS Take 1 tablet (10 mg total) by mouth 3 (three) times daily as needed. 90 tablet 0  . tamsulosin (FLOMAX) 0.4 MG CAPS capsule Take 1 capsule by mouth daily.  2  . temazepam (RESTORIL) 30 MG capsule TAKE 1 TO 2 CAPSULES BY MOUTH AT BEDTIME AS NEEDED FOR SLEEP 180 capsule 0  . vitamin B-12 (CYANOCOBALAMIN) 1000 MCG tablet Take 2,000 mcg by mouth daily.    . Vitamin D, Ergocalciferol, (DRISDOL) 1.25 MG (50000 UT) CAPS capsule TAKE 1 CAPSULE BY MOUTH EVERY 7 DAYS 12 capsule 0   No facility-administered medications prior to visit.    ROS: Review of Systems  Constitutional: Positive for fatigue. Negative for appetite change and unexpected weight change.  HENT: Negative for congestion, nosebleeds, sneezing, sore throat and trouble swallowing.   Eyes: Negative for itching and visual disturbance.  Respiratory: Negative for cough.   Cardiovascular: Negative for chest pain, palpitations and leg swelling.  Gastrointestinal: Positive for constipation. Negative for abdominal distention, blood in stool, diarrhea and nausea.  Genitourinary: Negative for frequency and hematuria.  Musculoskeletal: Positive for arthralgias, back pain, gait problem, myalgias and neck stiffness. Negative for joint swelling and neck pain.  Skin: Negative for rash.  Neurological: Positive for weakness. Negative for dizziness, tremors and speech difficulty.  Psychiatric/Behavioral: Positive for decreased concentration and sleep disturbance. Negative for agitation and dysphoric mood. The patient is nervous/anxious.     Objective:  BP 140/80   Pulse 83   Temp 98.3 F (36.8 C) (Oral)   Ht 6' 1"  (1.854 m)   Wt 239 lb (108.4 kg)   SpO2 94%   BMI 31.53 kg/m   BP Readings from Last 3 Encounters:  03/23/20 140/80  12/25/19 (!)  140/96  09/24/19 140/80    Wt Readings from Last 3 Encounters:  03/23/20 239 lb (108.4 kg)  12/25/19 233 lb (105.7 kg)  09/24/19 228 lb (103.4 kg)    Physical Exam Constitutional:      General: He is not in acute distress.    Appearance: He is well-developed. He is obese.     Comments: NAD  HENT:     Mouth/Throat:     Mouth: Oropharynx is clear and moist.  Eyes:     Conjunctiva/sclera: Conjunctivae normal.     Pupils: Pupils are equal, round, and reactive to light.  Neck:     Thyroid: No thyromegaly.     Vascular: No JVD.  Cardiovascular:     Rate and Rhythm: Normal rate and regular rhythm.     Pulses: Intact distal pulses.     Heart sounds: Normal heart sounds. No murmur heard. No friction rub. No gallop.   Pulmonary:     Effort: Pulmonary effort is normal. No respiratory distress.     Breath sounds: Normal breath sounds. No wheezing or rales.  Chest:     Chest wall: No tenderness.  Abdominal:     General: Bowel sounds are normal. There is no distension.     Palpations: Abdomen is soft. There is no mass.     Tenderness: There is no abdominal tenderness. There is no guarding or rebound.  Musculoskeletal:        General: Tenderness present. No edema. Normal range of motion.     Cervical back: Normal range of motion.  Lymphadenopathy:     Cervical: No cervical adenopathy.  Skin:    General: Skin is warm and dry.     Findings: No rash.  Neurological:     Mental Status: He is alert and oriented to person, place, and time.     Cranial Nerves: No cranial nerve deficit.     Motor: No abnormal muscle tone.     Coordination: He displays a negative Romberg sign. Coordination abnormal.     Gait: Gait abnormal.     Deep Tendon Reflexes: Reflexes are normal and symmetric.  Psychiatric:        Mood and Affect: Mood and affect normal.        Behavior: Behavior normal.        Thought Content: Thought content normal.        Judgment: Judgment normal.     Lab Results   Component Value Date   WBC 6.9 09/18/2018   HGB 15.4 09/18/2018   HCT 45.2 09/18/2018   PLT 225.0 09/18/2018   GLUCOSE 105 (H) 04/22/2019   CHOL 150 04/22/2019   TRIG 94 04/22/2019   HDL 51 04/22/2019   LDLCALC 81 04/22/2019   ALT 27 09/18/2018   AST 21 09/18/2018   NA 139 04/22/2019  K 4.8 04/22/2019   CL 100 04/22/2019   CREATININE 1.07 04/22/2019   BUN 11 04/22/2019   CO2 23 04/22/2019   TSH 1.45 09/18/2018   PSA 0.76 06/29/2016   INR 0.96 06/30/2013   HGBA1C 5.5 09/26/2018    CT ANGIO CHEST AORTA W &/OR WO CONTRAST  Result Date: 04/30/2019 CLINICAL DATA:  Thoracic aorta follow-up. EXAM: CT ANGIOGRAPHY CHEST WITH CONTRAST TECHNIQUE: Multidetector CT imaging of the chest was performed using the standard protocol during bolus administration of intravenous contrast. Multiplanar CT image reconstructions and MIPs were obtained to evaluate the vascular anatomy. CONTRAST:  164m OMNIPAQUE IOHEXOL 350 MG/ML SOLN COMPARISON:  CT dated 05/21/2018. FINDINGS: Cardiovascular: The ascending thoracic aorta is aneurysmal measuring up to approximately 4.1 cm in diameter (axial series 4, image 66). There is no evidence for thoracic aortic dissection or aneurysm. Minimal atherosclerotic changes are noted of the thoracic aorta. Mild coronary artery calcifications are noted. The heart size is normal. No acute pulmonary embolism was detected on this exam. There is no significant pericardial effusion. Mediastinum/Nodes: --No mediastinal or hilar lymphadenopathy. --No axillary lymphadenopathy. --No supraclavicular lymphadenopathy. --Normal thyroid gland. --The esophagus is unremarkable Lungs/Pleura: No pulmonary nodules or masses. No pleural effusion or pneumothorax. No focal airspace consolidation. No focal pleural abnormality. Upper Abdomen: No acute abnormality. Musculoskeletal: No chest wall abnormality. No acute or significant osseous findings. Review of the MIP images confirms the above findings.  IMPRESSION: 1. 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 Recommend annual imaging followup by CTA or MRA. This recommendation follows 2010 ACCF/AHA/AATS/ACR/ASA/SCA/SCAI/SIR/STS/SVM Guidelines for the Diagnosis and Management of Patients with Thoracic Aortic Disease. Circulation. 2010; 121:: [id number] Aortic aneurysm NOS (ICD10-I71.9) 2. No acute abnormality. No evidence for thoracic aortic dissection. No large centrally located pulmonary embolism. Aortic Atherosclerosis (ICD10-I70.0). Electronically Signed   By: CConstance HolsterM.D.   On: 04/30/2019 23:28    Assessment & Plan:     Follow-up: No follow-ups on file.  AWalker Kehr MD

## 2020-03-23 NOTE — Assessment & Plan Note (Signed)
On B12/B complex 

## 2020-03-24 NOTE — Assessment & Plan Note (Signed)
Chronic severe pain  °Continue with Duragesic, Oxy IR ° Potential benefits of a long term opioids use as well as potential risks (i.e. addiction risk, apnea etc) and complications (i.e. Somnolence, constipation and others) were explained to the patient and were aknowledged. °Diazepam for muscle spasms.  Not to be taken together with opioids °

## 2020-03-24 NOTE — Assessment & Plan Note (Signed)
Chronic severe pain  Continue with Duragesic, Oxy IR  Potential benefits of a long term opioids use as well as potential risks (i.e. addiction risk, apnea etc) and complications (i.e. Somnolence, constipation and others) were explained to the patient and were aknowledged. Diazepam for muscle spasms.  Not to be taken together with opioids

## 2020-04-05 ENCOUNTER — Telehealth: Payer: Self-pay | Admitting: Internal Medicine

## 2020-04-05 MED ORDER — FENTANYL 25 MCG/HR TD PT72: 1.0000 | MEDICATED_PATCH | TRANSDERMAL | 0 refills | Status: DC

## 2020-04-05 MED ORDER — OXYCODONE HCL 5 MG PO TABS: 10.0000 mg | ORAL_TABLET | Freq: Three times a day (TID) | ORAL | 0 refills | Status: DC | PRN

## 2020-04-05 NOTE — Telephone Encounter (Signed)
Hello, Dr. Alain Marion,  I spoke to our pharmacist at [facility] and she said there is no problem with you sending refills for [facility] Oxycodone and Fentanyl.  So,  one refill for each on January 28 and another for February 27 would be great.  Have a great start to the new year...and hopefully a happy and safe 2022!  Thank you, [name]

## 2020-04-20 ENCOUNTER — Telehealth: Payer: Self-pay | Admitting: *Deleted

## 2020-04-20 NOTE — Telephone Encounter (Signed)
Temazepam 30 mg PA initiated via CoverMyMeds.  Key: [id number]

## 2020-05-05 ENCOUNTER — Encounter: Payer: Self-pay | Admitting: Cardiology

## 2020-05-05 ENCOUNTER — Other Ambulatory Visit: Payer: Self-pay

## 2020-05-05 ENCOUNTER — Ambulatory Visit (INDEPENDENT_AMBULATORY_CARE_PROVIDER_SITE_OTHER): Payer: Medicare Other | Admitting: Cardiology

## 2020-05-05 VITALS — BP 156/100 | HR 76 | Ht 73.0 in | Wt 239.4 lb

## 2020-05-05 DIAGNOSIS — Z79899 Other long term (current) drug therapy: Secondary | ICD-10-CM | POA: Diagnosis not present

## 2020-05-05 DIAGNOSIS — Z7189 Other specified counseling: Secondary | ICD-10-CM

## 2020-05-05 DIAGNOSIS — E7800 Pure hypercholesterolemia, unspecified: Secondary | ICD-10-CM | POA: Diagnosis not present

## 2020-05-05 DIAGNOSIS — I712 Thoracic aortic aneurysm, without rupture, unspecified: Secondary | ICD-10-CM

## 2020-05-05 DIAGNOSIS — I2510 Atherosclerotic heart disease of native coronary artery without angina pectoris: Secondary | ICD-10-CM | POA: Diagnosis not present

## 2020-05-05 DIAGNOSIS — Z01812 Encounter for preprocedural laboratory examination: Secondary | ICD-10-CM

## 2020-05-05 NOTE — Progress Notes (Signed)
Cardiology Office Note:    Date:  05/05/2020   ID:  Lee Owens, DOB 09/04/1950, MRN [id number]  PCP:  Plotnikov, Aleksei V, MD  Cardiologist:  Bridgette Christopher, MD PhD  Referring MD: Plotnikov, Aleksei V, MD   CC: follow up  History of Present Illness:    Lee Owens is a 70 y.o. male with a hx of hypertension who was initially seen via telemedicine visit 06/25/18. He was referred after calcium score was done by Dr. Plotnikov.   Cardiac history: he was started on rosuvastatin 10 mg in the past. He was changed back to lovastatin at his visit with Dr. Plotnikov 09/26/18. He states muscle pain was severe on rosuvastatin, returned to lovastatin. Now tolerating again without issue. Reviewed history, he is unsure of all of the statins he has tried (documentation of simvastatin, unsure about atorvastatin).  Has history of high blood pressure, was taken off BP meds due to lightheadedness.    Today: Stressed currently due to recent issue with a business transaction. Hasn't been taking routine BP at home but has ability to. Did take randomly last week, BP 120/85.   Drinks 8-10 diet sodas or gallon of tea daily as he sits at the computer.   About 2 days ago, had dizziness when he sat up in bed. Also a fall when putting away Christmas decorations. No syncope/loss of consciousness.  Biggest issues have been stress and joint pain.   Denies chest pain, shortness of breath at rest or with normal exertion. No PND, orthopnea, LE edema or unexpected weight gain. No syncope or palpitations.  Brother had heart attack 2 weeks ago, in his early 60s. Reportedly has poor health habits.   Past Medical History:  Diagnosis Date  . Allergic rhinitis   . Anxiety    takes Valium bid  . Arthritis    "hips, knees, hands" (07/07/2013)  . Back pain    occasionally but reason unknown  . BPH (benign prostatic hypertrophy)    takes Proscar daily  . Chronic fatigue   . Complication of anesthesia    Per  pt, "Hard to sedate" past last colonoscopy in 2008!  . Depression    takes Cymbalta daily  . Diverticulosis   . Dizziness    due to dehydration  . Hemorrhoids   . Hyperlipidemia    takes Lovastatin daily  . Hypertension   . Insomnia    takes Restoril nightly   . Joint pain    knees/ankles/back pain  . Joint swelling   . Muscle pain    takes Baclofen daily  . Night muscle spasms    takes Valium as needed  . Pneumonia 1971  . Right knee DJD   . Urinary frequency    takes rapaflo daily  . Vitamin B12 deficiency 2009  . Vitamin D deficiency disease     Past Surgical History:  Procedure Laterality Date  . COLONOSCOPY    . JOINT REPLACEMENT     both knees  . KNEE ARTHROSCOPY Bilateral 2012  . PROSTATE BIOPSY  01/2010  . 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   Procedure: TOTAL KNEE ARTHROPLASTY- left;  Surgeon: Robert A Wainer, MD;  Location: [facility];  Service: Orthopedics;  Laterality: Left;  . TOTAL KNEE ARTHROPLASTY Right 07/07/2013  . TOTAL KNEE ARTHROPLASTY Right 07/07/2013   Procedure: TOTAL KNEE ARTHROPLASTY- right;  Surgeon: Robert A Wainer, MD;  Location: [facility];  Service: Orthopedics;  Laterality: Right;    Current Medications: Current Outpatient Medications on File   Prior to Visit  Medication Sig  . allopurinol (ZYLOPRIM) 300 MG tablet TAKE 1 TABLET(300 MG) BY MOUTH DAILY  . buPROPion (WELLBUTRIN XL) 150 MG 24 hr tablet TAKE 2 TABLETS(300 MG) BY MOUTH DAILY- KEEP SCHEDULED APPT OR REFILLS  . Cholecalciferol (VITAMIN D-3) 5000 UNITS TABS Take 5,000 Units by mouth daily.  . Coenzyme Q10 (COQ10) 400 MG CAPS 1 po qd  . diazepam (VALIUM) 10 MG tablet TAKE 1/2 TO 1 TABLET(5 TO 10 MG) BY MOUTH EVERY 8 HOURS AS NEEDED FOR ANXIETY OR MUSCLE SPASMS  . DULoxetine (CYMBALTA) 60 MG capsule TAKE 1 CAPSULE BY MOUTH TWICE DAILY  . fentaNYL (DURAGESIC) 25 MCG/HR Place 1 patch onto the skin every other day.  . finasteride (PROSCAR) 5 MG tablet Take 5 mg by mouth daily.  Marland Kitchen gabapentin  (NEURONTIN) 300 MG capsule TAKE 1 CAPSULE(300 MG) BY MOUTH AT BEDTIME  . L-Methylfolate-Algae 15-90.314 MG CAPS TAKE 1 CAPSULE BY MOUTH ONCE DAILY  . lovastatin (MEVACOR) 40 MG tablet Take 1 tablet (40 mg total) by mouth at bedtime. Must keep scheduled appt for future refills  . oxyCODONE (OXY IR/ROXICODONE) 5 MG immediate release tablet Take 2 tablets (10 mg total) by mouth every 8 (eight) hours as needed for severe pain (chronic back pain).  . tamsulosin (FLOMAX) 0.4 MG CAPS capsule Take 1 capsule by mouth daily.  . temazepam (RESTORIL) 30 MG capsule TAKE 1 TO 2 CAPSULES BY MOUTH AT BEDTIME AS NEEDED FOR SLEEP  . vitamin B-12 (CYANOCOBALAMIN) 1000 MCG tablet Take 2,000 mcg by mouth daily.  . Vitamin D, Ergocalciferol, (DRISDOL) 1.25 MG (50000 UT) CAPS capsule TAKE 1 CAPSULE BY MOUTH EVERY 7 DAYS  . NARCAN 4 MG/0.1ML LIQD nasal spray kit as needed. (Patient not taking: Reported on 05/05/2020)  . Oxycodone HCl 10 MG TABS Take 1 tablet (10 mg total) by mouth every 8 (eight) hours as needed.   No current facility-administered medications on file prior to visit.     Allergies:   Crestor [rosuvastatin calcium]   Social History   Tobacco Use  . Smoking status: Former Smoker    Packs/day: 1.00    Years: 38.00    Pack years: 38.00    Types: Cigarettes    Quit date: 10/23/2005    Years since quitting: 14.5  . Smokeless tobacco: Never Used  . Tobacco comment: quit smoking 2008  Substance Use Topics  . Alcohol use: Yes    Alcohol/week: 14.0 standard drinks    Types: 14 Cans of beer per week    Comment: 03/09/17 "2 beers daily"  . Drug use: No    Comment: quit age 33    Family History: The patient's family history includes Atrial fibrillation in his father; Coronary artery disease in his father and another family member; Dementia in his sister; Heart failure in his father; Hypertension in an other family member; Kidney disease in his father; Liver cancer in his mother.  ROS:   Please see  the history of present illness.  Additional pertinent ROS negative except as documented  EKGs/Labs/Other Studies Reviewed:    The following studies were reviewed today: CT angio 04/30/19 Cardiovascular: The ascending thoracic aorta is aneurysmal measuring up to approximately 4.1 cm in diameter (axial series 4, image 66). There is no evidence for thoracic aortic dissection or aneurysm. Minimal atherosclerotic changes are noted of the thoracic aorta. Mild coronary artery calcifications are noted. The heart size is normal. No acute pulmonary embolism was detected on this exam. There is  no significant pericardial effusion.   EKG:  EKG is personally reviewed.  The ekg ordered today demonstrates NSR at 76 bpm with nonspecific ST/T pattern  Recent Labs: No results found for requested labs within last 8760 hours.  Recent Lipid Panel    Component Value Date/Time   CHOL 150 04/22/2019 1150   TRIG 94 04/22/2019 1150   HDL 51 04/22/2019 1150   CHOLHDL 2.9 04/22/2019 1150   CHOLHDL 4 09/18/2018 1038   VLDL 25.6 09/18/2018 1038   LDLCALC 81 04/22/2019 1150    Physical Exam:    VS:  BP (!) 156/100 (BP Location: Left Arm, Patient Position: Sitting)   Pulse 76   Ht 6' 1" (1.854 m)   Wt 239 lb 6.4 oz (108.6 kg)   SpO2 95%   BMI 31.59 kg/m     Wt Readings from Last 3 Encounters:  05/05/20 239 lb 6.4 oz (108.6 kg)  03/23/20 239 lb (108.4 kg)  12/25/19 233 lb (105.7 kg)    GEN: Well nourished, well developed in no acute distress HEENT: Normal, moist mucous membranes NECK: No JVD CARDIAC: regular rhythm, normal S1 and S2, no rubs or gallops. No murmur. VASCULAR: Radial and DP pulses 2+ bilaterally. No carotid bruits RESPIRATORY:  Clear to auscultation without rales, wheezing or rhonchi  ABDOMEN: Soft, non-tender, non-distended MUSCULOSKELETAL:  Ambulates independently SKIN: Warm and dry, no edema NEUROLOGIC:  Alert and oriented x 3. No focal neuro deficits noted. PSYCHIATRIC:  Normal  affect   ASSESSMENT:    1. Thoracic aortic aneurysm without rupture ([place])   2. Medication management   3. Pre-procedure lab exam   4. Coronary artery calcification seen on CT scan   5. Cardiac risk counseling   6. Pure hypercholesterolemia    PLAN:    Coronary calcium seen on CT imaging: calcium score 129/55th percentile. No symptoms.  -this is consistent with mild CAD and would treat accordingly -already on aspirin 81 mg daily, tolerating -did not tolerate rosuvastatin, simvastatin. Tolerating lovastatin and CoQ10 -discussed CV risk management, below  Thoracic aortic ectasia:  -CT angio shows 4.1 cm.  -labs, repeat angio ordered today  Hypertension:  -reviewed goal of <130/80 given TAA -continue to monitor, would start ARB or beta blocker if rises -he reports recent good control at home but is very stressed today. Has lightheadedness on prior antihypertensives.  Hyperlipidemia: LDL goal <70 -LDL 81 on most recent lipids, recheck today -continue lovastatin and CoQ10  Cardiac risk counseling and prevention recommendations: We focused a lot on lifestyle today. His LDL is not at goal, but close. He would like to work on diet and exercise first before adjusting medications further. -recommend heart healthy/Mediterranean diet, with whole grains, fruits, vegetable, fish, lean meats, nuts, and olive oil. Limit salt. -recommend moderate walking, 3-5 times/week for 30-50 minutes each session. Aim for at least 150 minutes.week. Goal should be pace of 3 miles/hours, or walking 1.5 miles in 30 minutes -recommend avoidance of tobacco products. Avoid excess alcohol.  Plan for follow up: 1 year or sooner PRN  Medication Adjustments/Labs and Tests Ordered: Current medicines are reviewed at length with the patient today.  Concerns regarding medicines are outlined above.  Orders Placed This Encounter  Procedures  . CT ANGIO CHEST AORTA W/CM & OR WO/CM  . Lipid panel  . Basic metabolic  panel  . EKG 12-Lead   No orders of the defined types were placed in this encounter.   Patient Instructions  Medication Instructions:  Your Physician  recommend you continue on your current medication as directed.    *If you need a refill on your cardiac medications before your next appointment, please call your pharmacy*   Lab Work: Your physician recommends lab work today (BMP, Lipid).  If you have labs (blood work) drawn today and your tests are completely normal, you will receive your results only by: Marland Kitchen MyChart Message (if you have MyChart) OR . A paper copy in the mail If you have any lab test that is abnormal or we need to change your treatment, we will call you to review the results.   Testing/Procedures: Chest CT Angiography (CTA), is a special type of CT scan that uses a computer to produce multi-dimensional views of major blood vessels throughout the body. In CT angiography, a contrast material is injected through an IV to help visualize the blood vessels [facility]   Follow-Up: At [facility], you and your health needs are our priority.  As part of our continuing mission to provide you with exceptional heart care, we have created designated Provider Care Teams.  These Care Teams include your primary Cardiologist (physician) and Advanced Practice Providers (APPs -  Physician Assistants and Nurse Practitioners) who all work together to provide you with the care you need, when you need it.  We recommend signing up for the patient portal called "MyChart".  Sign up information is provided on this After Visit Summary.  MyChart is used to connect with patients for Virtual Visits (Telemedicine).  Patients are able to view lab/test results, encounter notes, upcoming appointments, etc.  Non-urgent messages can be sent to your provider as well.   To learn more about what you can do with MyChart, go to [email or web address].    Your next appointment:   1 year(s)  The  format for your next appointment:   In Person  Provider:   Buford Dresser, MD       Signed, Buford Dresser, MD PhD 05/05/2020  [place]

## 2020-05-05 NOTE — Patient Instructions (Signed)
Medication Instructions:  Your Physician recommend you continue on your current medication as directed.    *If you need a refill on your cardiac medications before your next appointment, please call your pharmacy*   Lab Work: Your physician recommends lab work today (BMP, Lipid).  If you have labs (blood work) drawn today and your tests are completely normal, you will receive your results only by: Marland Kitchen MyChart Message (if you have MyChart) OR . A paper copy in the mail If you have any lab test that is abnormal or we need to change your treatment, we will call you to review the results.   Testing/Procedures: Chest CT Angiography (CTA), is a special type of CT scan that uses a computer to produce multi-dimensional views of major blood vessels throughout the body. In CT angiography, a contrast material is injected through an IV to help visualize the blood vessels [facility]   Follow-Up: At [facility], you and your health needs are our priority.  As part of our continuing mission to provide you with exceptional heart care, we have created designated Provider Care Teams.  These Care Teams include your primary Cardiologist (physician) and Advanced Practice Providers (APPs -  Physician Assistants and Nurse Practitioners) who all work together to provide you with the care you need, when you need it.  We recommend signing up for the patient portal called "MyChart".  Sign up information is provided on this After Visit Summary.  MyChart is used to connect with patients for Virtual Visits (Telemedicine).  Patients are able to view lab/test results, encounter notes, upcoming appointments, etc.  Non-urgent messages can be sent to your provider as well.   To learn more about what you can do with MyChart, go to [email or web address].    Your next appointment:   1 year(s)  The format for your next appointment:   In Person  Provider:   Buford Dresser, MD

## 2020-05-06 DIAGNOSIS — N401 Enlarged prostate with lower urinary tract symptoms: Secondary | ICD-10-CM | POA: Diagnosis not present

## 2020-05-06 DIAGNOSIS — R3914 Feeling of incomplete bladder emptying: Secondary | ICD-10-CM | POA: Diagnosis not present

## 2020-05-06 DIAGNOSIS — R972 Elevated prostate specific antigen [PSA]: Secondary | ICD-10-CM | POA: Diagnosis not present

## 2020-05-06 DIAGNOSIS — N5201 Erectile dysfunction due to arterial insufficiency: Secondary | ICD-10-CM | POA: Diagnosis not present

## 2020-05-06 LAB — BASIC METABOLIC PANEL
BUN/Creatinine Ratio: 9 — ABNORMAL LOW (ref 10–24)
BUN: 10 mg/dL (ref 8–27)
CO2: 22 mmol/L (ref 20–29)
Calcium: 9.5 mg/dL (ref 8.6–10.2)
Chloride: 98 mmol/L (ref 96–106)
Creatinine, Ser: 1.06 mg/dL (ref 0.76–1.27)
GFR calc Af Amer: 82 mL/min/{1.73_m2} (ref >59)
GFR calc non Af Amer: 71 mL/min/{1.73_m2} (ref >59)
Glucose: 93 mg/dL (ref 65–99)
Potassium: 4.7 mmol/L (ref 3.5–5.2)
Sodium: 139 mmol/L (ref 134–144)

## 2020-05-06 LAB — LIPID PANEL
Chol/HDL Ratio: 3.1 ratio (ref 0.0–5.0)
Cholesterol, Total: 165 mg/dL (ref 100–199)
HDL: 54 mg/dL (ref >39)
LDL Chol Calc (NIH): 95 mg/dL (ref 0–99)
Triglycerides: 87 mg/dL (ref 0–149)
VLDL Cholesterol Cal: 16 mg/dL (ref 5–40)

## 2020-05-19 ENCOUNTER — Other Ambulatory Visit: Payer: Self-pay

## 2020-05-19 ENCOUNTER — Telehealth: Payer: Self-pay | Admitting: Cardiology

## 2020-05-19 ENCOUNTER — Ambulatory Visit (HOSPITAL_COMMUNITY)
Admission: RE | Admit: 2020-05-19 | Discharge: 2020-05-19 | Disposition: A | Discharge status: Home / Self Care | Payer: Medicare Other | Source: Ambulatory Visit | Attending: Cardiology | Admitting: Cardiology

## 2020-05-19 DIAGNOSIS — I700 Atherosclerosis of aorta: Secondary | ICD-10-CM | POA: Diagnosis not present

## 2020-05-19 DIAGNOSIS — I712 Thoracic aortic aneurysm, without rupture, unspecified: Secondary | ICD-10-CM

## 2020-05-19 DIAGNOSIS — I2510 Atherosclerotic heart disease of native coronary artery without angina pectoris: Secondary | ICD-10-CM | POA: Diagnosis not present

## 2020-05-19 MED ORDER — IOHEXOL 350 MG/ML SOLN
75.0000 mL | Freq: Once | INTRAVENOUS | Status: AC | PRN
  Administered 2020-05-19: 75 mL via INTRAVENOUS

## 2020-05-19 NOTE — Telephone Encounter (Signed)
Patient returning call for lab results. 

## 2020-05-19 NOTE — Telephone Encounter (Signed)
Pt updated with lab results along with MD's recommendations. Pt verbalized understanding.

## 2020-05-20 ENCOUNTER — Other Ambulatory Visit: Payer: Self-pay | Admitting: Internal Medicine

## 2020-05-20 MED ORDER — FENTANYL 25 MCG/HR TD PT72: 1.0000 | MEDICATED_PATCH | TRANSDERMAL | 0 refills | Status: DC

## 2020-05-20 MED ORDER — OXYCODONE HCL 5 MG PO TABS: 10.0000 mg | ORAL_TABLET | Freq: Three times a day (TID) | ORAL | 0 refills | Status: DC | PRN

## 2020-05-20 NOTE — Progress Notes (Signed)
Meds refill

## 2020-05-21 ENCOUNTER — Other Ambulatory Visit: Payer: Self-pay | Admitting: Internal Medicine

## 2020-05-31 ENCOUNTER — Other Ambulatory Visit: Payer: Self-pay

## 2020-05-31 DIAGNOSIS — I77810 Thoracic aortic ectasia: Secondary | ICD-10-CM

## 2020-05-31 DIAGNOSIS — I712 Thoracic aortic aneurysm, without rupture, unspecified: Secondary | ICD-10-CM

## 2020-06-07 ENCOUNTER — Other Ambulatory Visit: Payer: Self-pay | Admitting: Internal Medicine

## 2020-06-07 NOTE — Telephone Encounter (Signed)
MD is out of the office. Check [place] registry last filled 04/24/2020.Marland KitchenJohny Chess

## 2020-06-07 NOTE — Telephone Encounter (Signed)
[facility] called, patient needs a new script, he has already picked up his refill so is due for more  diazepam (VALIUM) 10 MG tablet   [id number] [address], [address] AT [place] Phone:  [phone]  Fax:  [phone]

## 2020-06-13 ENCOUNTER — Ambulatory Visit (INDEPENDENT_AMBULATORY_CARE_PROVIDER_SITE_OTHER): Payer: Medicare Other

## 2020-06-13 ENCOUNTER — Encounter (HOSPITAL_COMMUNITY): Payer: Self-pay | Admitting: Urgent Care

## 2020-06-13 ENCOUNTER — Other Ambulatory Visit: Payer: Self-pay

## 2020-06-13 ENCOUNTER — Ambulatory Visit (HOSPITAL_COMMUNITY)
Admission: EM | Admit: 2020-06-13 | Discharge: 2020-06-13 | Disposition: A | Discharge status: Home / Self Care | Payer: Medicare Other | Attending: Urgent Care | Admitting: Urgent Care

## 2020-06-13 DIAGNOSIS — G8929 Other chronic pain: Secondary | ICD-10-CM | POA: Diagnosis not present

## 2020-06-13 DIAGNOSIS — M79605 Pain in left leg: Secondary | ICD-10-CM

## 2020-06-13 DIAGNOSIS — G894 Chronic pain syndrome: Secondary | ICD-10-CM

## 2020-06-13 NOTE — ED Provider Notes (Signed)
Lee Owens   MRN: [id number] DOB: [birth year]  Subjective:   Lee Owens is a 70 y.o. male presenting for several week history of persistent intermittent left lower leg pain with intermittent swelling.  Patient states that typically symptoms are not as bad in the morning but as the day progresses gets worse.  He has noted some focal areas of swelling with slight discoloration.  He does have chronic pain, has had both knees replaced.  Takes chronic opioid pain medications.  Denies any particular fall, trauma, history of gout open wounds.  Patient has difficulty with his mobility has he has chronic pain of his knees status post knee replacement.  Admits that he does have a sedentary lifestyle.  Sometimes he walks his dog and he enjoys that, has an elliptical but does not use it.  No current facility-administered medications for this encounter.  Current Outpatient Medications:  .  allopurinol (ZYLOPRIM) 300 MG tablet, TAKE 1 TABLET(300 MG) BY MOUTH DAILY, Disp: 90 tablet, Rfl: 3 .  buPROPion (WELLBUTRIN XL) 150 MG 24 hr tablet, TAKE 2 TABLETS(300 MG) BY MOUTH DAILY- KEEP SCHEDULED APPT OR REFILLS, Disp: 60 tablet, Rfl: 0 .  Cholecalciferol (VITAMIN D-3) 5000 UNITS TABS, Take 5,000 Units by mouth daily., Disp: , Rfl:  .  Coenzyme Q10 (COQ10) 400 MG CAPS, 1 po qd, Disp: 100 capsule, Rfl: 3 .  diazepam (VALIUM) 10 MG tablet, TAKE 1/2 TO 1 TABLET(5 TO 10 MG) BY MOUTH EVERY 8 HOURS AS NEEDED FOR ANXIETY OR MUSCLE SPASMS, Disp: 90 tablet, Rfl: 0 .  DULoxetine (CYMBALTA) 60 MG capsule, Take 1 capsule (60 mg total) by mouth 2 (two) times daily. Must keep scheduled appt for future refills, Disp: 60 capsule, Rfl: 0 .  fentaNYL (DURAGESIC) 25 MCG/HR, Place 1 patch onto the skin every other day., Disp: 15 patch, Rfl: 0 .  finasteride (PROSCAR) 5 MG tablet, Take 5 mg by mouth daily., Disp: , Rfl:  .  gabapentin (NEURONTIN) 300 MG capsule, TAKE 1 CAPSULE(300 MG) BY MOUTH AT BEDTIME, Disp: 90  capsule, Rfl: 2 .  L-Methylfolate-Algae 15-90.314 MG CAPS, TAKE 1 CAPSULE BY MOUTH ONCE DAILY, Disp: 30 capsule, Rfl: 11 .  lovastatin (MEVACOR) 40 MG tablet, Take 1 tablet (40 mg total) by mouth at bedtime. Must keep scheduled appt for future refills, Disp: 30 tablet, Rfl: 0 .  NARCAN 4 MG/0.1ML LIQD nasal spray kit, as needed. (Patient not taking: Reported on 05/05/2020), Disp: , Rfl: 0 .  oxyCODONE (OXY IR/ROXICODONE) 5 MG immediate release tablet, Take 2 tablets (10 mg total) by mouth every 8 (eight) hours as needed for severe pain (chronic back pain)., Disp: 180 tablet, Rfl: 0 .  Oxycodone HCl 10 MG TABS, Take 1 tablet (10 mg total) by mouth every 8 (eight) hours as needed., Disp: 90 tablet, Rfl: 0 .  tamsulosin (FLOMAX) 0.4 MG CAPS capsule, Take 1 capsule by mouth daily., Disp: , Rfl: 2 .  temazepam (RESTORIL) 30 MG capsule, TAKE 1 TO 2 CAPSULES BY MOUTH AT BEDTIME AS NEEDED FOR SLEEP, Disp: 180 capsule, Rfl: 0 .  vitamin B-12 (CYANOCOBALAMIN) 1000 MCG tablet, Take 2,000 mcg by mouth daily., Disp: , Rfl:  .  Vitamin D, Ergocalciferol, (DRISDOL) 1.25 MG (50000 UT) CAPS capsule, TAKE 1 CAPSULE BY MOUTH EVERY 7 DAYS, Disp: 12 capsule, Rfl: 0   Allergies  Allergen Reactions  . Crestor [Rosuvastatin Calcium]     myalgias    Past Medical History:  Diagnosis Date  .  Allergic rhinitis   . Anxiety    takes Valium bid  . Arthritis    "hips, knees, hands" (07/07/2013)  . Back pain    occasionally but reason unknown  . BPH (benign prostatic hypertrophy)    takes Proscar daily  . Chronic fatigue   . Complication of anesthesia    Per pt, "Hard to sedate" past last colonoscopy in 2008!  . Depression    takes Cymbalta daily  . Diverticulosis   . Dizziness    due to dehydration  . Hemorrhoids   . Hyperlipidemia    takes Lovastatin daily  . Hypertension   . Insomnia    takes Restoril nightly   . Joint pain    knees/ankles/back pain  . Joint swelling   . Muscle pain    takes Baclofen  daily  . Night muscle spasms    takes Valium as needed  . Pneumonia 1971  . Right knee DJD   . Urinary frequency    takes rapaflo daily  . Vitamin B12 deficiency 2009  . Vitamin D deficiency disease      Past Surgical History:  Procedure Laterality Date  . COLONOSCOPY    . JOINT REPLACEMENT     both knees  . KNEE ARTHROSCOPY Bilateral 2012  . PROSTATE BIOPSY  01/2010  . 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   Procedure: TOTAL KNEE ARTHROPLASTY- left;  Surgeon: Lorn Junes, MD;  Location: [facility];  Service: Orthopedics;  Laterality: Left;  . TOTAL KNEE ARTHROPLASTY Right 07/07/2013  . TOTAL KNEE ARTHROPLASTY Right 07/07/2013   Procedure: TOTAL KNEE ARTHROPLASTY- right;  Surgeon: Lorn Junes, MD;  Location: [place];  Service: Orthopedics;  Laterality: Right;    Family History  Problem Relation Age of Onset  . Liver cancer Mother   . Heart failure Father   . Kidney disease Father   . Atrial fibrillation Father   . Coronary artery disease Father   . Hypertension Other   . Coronary artery disease Other   . Dementia Sister     Social History   Tobacco Use  . Smoking status: Former Smoker    Packs/day: 1.00    Years: 38.00    Pack years: 38.00    Types: Cigarettes    Quit date: 10/23/2005    Years since quitting: 14.6  . Smokeless tobacco: Never Used  . Tobacco comment: quit smoking 2008  Substance Use Topics  . Alcohol use: Yes    Alcohol/week: 14.0 standard drinks    Types: 14 Cans of beer per week    Comment: 03/09/17 "2 beers daily"  . Drug use: No    Comment: quit age 66    ROS   Objective:   Vitals: BP (!) 144/85 (BP Location: Right Arm)   Pulse 88   Temp 97.7 F (36.5 C) (Oral)   Resp 16   SpO2 95%   Physical Exam Constitutional:      General: He is not in acute distress.    Appearance: Normal appearance. He is well-developed and normal weight. He is not ill-appearing, toxic-appearing or diaphoretic.  HENT:     Head: Normocephalic and  atraumatic.     Right Ear: External ear normal.     Left Ear: External ear normal.     Nose: Nose normal.     Mouth/Throat:     Pharynx: Oropharynx is clear.  Eyes:     General: No scleral icterus.       Right eye: No  discharge.        Left eye: No discharge.     Extraocular Movements: Extraocular movements intact.     Pupils: Pupils are equal, round, and reactive to light.  Cardiovascular:     Rate and Rhythm: Normal rate.  Pulmonary:     Effort: Pulmonary effort is normal.  Musculoskeletal:     Cervical back: Normal range of motion.       Legs:     Comments: No erythema, open wounds.  Skin is warm and comparable across each lower extremity.  Skin:    General: Skin is warm and dry.  Neurological:     Mental Status: He is alert and oriented to person, place, and time.  Psychiatric:        Mood and Affect: Mood normal.        Behavior: Behavior normal.        Thought Content: Thought content normal.        Judgment: Judgment normal.     DG Tibia/Fibula Left  Result Date: 06/13/2020 CLINICAL DATA:  Chronic left leg pain which has worsened over the past week. Initial encounter. EXAM: LEFT TIBIA AND FIBULA - 2 VIEW COMPARISON:  Plain films left foot 10/01/2017. FINDINGS: There is no evidence of fracture or other focal bone lesions. Left knee arthroplasty is normal in appearance. Plantar calcaneal spur is incidentally noted and unchanged. Soft tissues are unremarkable. IMPRESSION: Negative exam. Electronically Signed   By: Inge Rise M.D.   On: 06/13/2020 13:59     Assessment and Plan :   I have reviewed the PDMP during this encounter.  1. Left leg pain   2. Chronic pain syndrome     Negative x-rays.  Recommended conservative management as I do not see any physical exam findings of cellulitis, DVT.  Recommended compression stockings to help with the swelling, will avoid Lasix as he does not have true edema.  Recommended close follow-up with his PCP, consideration for  referral to vascular and vein specialist.  Continue to maintain chronic pain medicines. Counseled patient on potential for adverse effects with medications prescribed/recommended today, ER and return-to-clinic precautions discussed, patient verbalized understanding.    Jaynee Eagles, PA-C 06/13/20 1425

## 2020-06-13 NOTE — Discharge Instructions (Signed)
Please continue using your chronic pain medications. Wear compression stockings. Contact your PCP about a referral for an ultrasound or referral to a vascular and vein specialist for your lower legs.

## 2020-06-13 NOTE — ED Triage Notes (Signed)
Pt reports the Lt leg pain has been worse for one week . Pt reports he has had pain in Lt leg for years and years and takes Pain meds for chronic pain. Pt ambulatory to room with out assistance.

## 2020-06-16 ENCOUNTER — Other Ambulatory Visit: Payer: Self-pay

## 2020-06-17 ENCOUNTER — Other Ambulatory Visit: Payer: Self-pay

## 2020-06-17 ENCOUNTER — Ambulatory Visit (INDEPENDENT_AMBULATORY_CARE_PROVIDER_SITE_OTHER): Payer: Medicare Other | Admitting: Internal Medicine

## 2020-06-17 ENCOUNTER — Encounter: Payer: Self-pay | Admitting: Internal Medicine

## 2020-06-17 DIAGNOSIS — E538 Deficiency of other specified B group vitamins: Secondary | ICD-10-CM

## 2020-06-17 DIAGNOSIS — M7989 Other specified soft tissue disorders: Secondary | ICD-10-CM | POA: Diagnosis not present

## 2020-06-17 DIAGNOSIS — E559 Vitamin D deficiency, unspecified: Secondary | ICD-10-CM | POA: Diagnosis not present

## 2020-06-17 DIAGNOSIS — G8929 Other chronic pain: Secondary | ICD-10-CM

## 2020-06-17 DIAGNOSIS — M5442 Lumbago with sciatica, left side: Secondary | ICD-10-CM | POA: Diagnosis not present

## 2020-06-17 DIAGNOSIS — I2510 Atherosclerotic heart disease of native coronary artery without angina pectoris: Secondary | ICD-10-CM

## 2020-06-17 DIAGNOSIS — M5441 Lumbago with sciatica, right side: Secondary | ICD-10-CM | POA: Diagnosis not present

## 2020-06-17 MED ORDER — OXYCODONE HCL 5 MG PO TABS: 10.0000 mg | ORAL_TABLET | Freq: Three times a day (TID) | ORAL | 0 refills | Status: DC | PRN

## 2020-06-17 MED ORDER — FENTANYL 25 MCG/HR TD PT72: 1.0000 | MEDICATED_PATCH | TRANSDERMAL | 0 refills | Status: DC

## 2020-06-17 MED ORDER — GABAPENTIN 300 MG PO CAPS: 300.0000 mg | ORAL_CAPSULE | Freq: Three times a day (TID) | ORAL | 3 refills | Status: DC

## 2020-06-17 NOTE — Assessment & Plan Note (Signed)
On Vit D 

## 2020-06-17 NOTE — Assessment & Plan Note (Signed)
Acute on chronic D dimer

## 2020-06-17 NOTE — Progress Notes (Signed)
Subjective:  Patient ID: Lee Owens, male    DOB: Jul 19, 1950  Age: 70 y.o. MRN: [id number]  CC: Follow-up (3 month f/u)   HPI Lee Owens presents for LLE swelling, pain (chronic) - worse on Sunday w/pain  Outpatient Medications Prior to Visit  Medication Sig Dispense Refill  . allopurinol (ZYLOPRIM) 300 MG tablet TAKE 1 TABLET(300 MG) BY MOUTH DAILY 90 tablet 3  . buPROPion (WELLBUTRIN XL) 150 MG 24 hr tablet TAKE 2 TABLETS(300 MG) BY MOUTH DAILY- KEEP SCHEDULED APPT OR REFILLS 60 tablet 0  . Cholecalciferol (VITAMIN D-3) 5000 UNITS TABS Take 5,000 Units by mouth daily.    . Coenzyme Q10 (COQ10) 400 MG CAPS 1 po qd 100 capsule 3  . diazepam (VALIUM) 10 MG tablet TAKE 1/2 TO 1 TABLET(5 TO 10 MG) BY MOUTH EVERY 8 HOURS AS NEEDED FOR ANXIETY OR MUSCLE SPASMS 90 tablet 0  . DULoxetine (CYMBALTA) 60 MG capsule Take 1 capsule (60 mg total) by mouth 2 (two) times daily. Must keep scheduled appt for future refills 60 capsule 0  . fentaNYL (DURAGESIC) 25 MCG/HR Place 1 patch onto the skin every other day. 15 patch 0  . finasteride (PROSCAR) 5 MG tablet Take 5 mg by mouth daily.    Marland Kitchen gabapentin (NEURONTIN) 300 MG capsule TAKE 1 CAPSULE(300 MG) BY MOUTH AT BEDTIME 90 capsule 2  . L-Methylfolate-Algae 15-90.314 MG CAPS TAKE 1 CAPSULE BY MOUTH ONCE DAILY 30 capsule 11  . lovastatin (MEVACOR) 40 MG tablet Take 1 tablet (40 mg total) by mouth at bedtime. Must keep scheduled appt for future refills 30 tablet 0  . NARCAN 4 MG/0.1ML LIQD nasal spray kit as needed.  0  . oxyCODONE (OXY IR/ROXICODONE) 5 MG immediate release tablet Take 2 tablets (10 mg total) by mouth every 8 (eight) hours as needed for severe pain (chronic back pain). 180 tablet 0  . Oxycodone HCl 10 MG TABS Take 1 tablet (10 mg total) by mouth every 8 (eight) hours as needed. 90 tablet 0  . tamsulosin (FLOMAX) 0.4 MG CAPS capsule Take 1 capsule by mouth daily.  2  . temazepam (RESTORIL) 30 MG capsule TAKE 1 TO 2 CAPSULES BY MOUTH AT  BEDTIME AS NEEDED FOR SLEEP 180 capsule 0  . vitamin B-12 (CYANOCOBALAMIN) 1000 MCG tablet Take 2,000 mcg by mouth daily.    . Vitamin D, Ergocalciferol, (DRISDOL) 1.25 MG (50000 UT) CAPS capsule TAKE 1 CAPSULE BY MOUTH EVERY 7 DAYS (Patient not taking: Reported on 06/17/2020) 12 capsule 0   No facility-administered medications prior to visit.    ROS: Review of Systems  Constitutional: Negative for appetite change, fatigue and unexpected weight change.  HENT: Negative for congestion, nosebleeds, sneezing, sore throat and trouble swallowing.   Eyes: Negative for itching and visual disturbance.  Respiratory: Negative for cough.   Cardiovascular: Negative for chest pain, palpitations and leg swelling.  Gastrointestinal: Negative for abdominal distention, blood in stool, diarrhea and nausea.  Genitourinary: Negative for frequency and hematuria.  Musculoskeletal: Positive for arthralgias, back pain, gait problem and myalgias. Negative for joint swelling and neck pain.  Skin: Negative for rash.  Neurological: Negative for dizziness, tremors, speech difficulty and weakness.  Psychiatric/Behavioral: Positive for sleep disturbance. Negative for agitation, dysphoric mood and suicidal ideas. The patient is not nervous/anxious.     Objective:  BP (!) 142/82 (BP Location: Left Arm)   Pulse 87   Temp 98 F (36.7 C) (Oral)   Ht 6' 1"  (1.854 m)  Wt 236 lb 6.4 oz (107.2 kg)   SpO2 98%   BMI 31.19 kg/m   BP Readings from Last 3 Encounters:  06/17/20 (!) 142/82  06/13/20 (!) 144/85  05/05/20 (!) 156/100    Wt Readings from Last 3 Encounters:  06/17/20 236 lb 6.4 oz (107.2 kg)  05/05/20 239 lb 6.4 oz (108.6 kg)  03/23/20 239 lb (108.4 kg)    Physical Exam Constitutional:      General: He is not in acute distress.    Appearance: He is well-developed. He is obese.     Comments: NAD  Eyes:     Conjunctiva/sclera: Conjunctivae normal.     Pupils: Pupils are equal, round, and reactive to  light.  Neck:     Thyroid: No thyromegaly.     Vascular: No JVD.  Cardiovascular:     Rate and Rhythm: Normal rate and regular rhythm.     Heart sounds: Normal heart sounds. No murmur heard. No friction rub. No gallop.   Pulmonary:     Effort: Pulmonary effort is normal. No respiratory distress.     Breath sounds: Normal breath sounds. No wheezing or rales.  Chest:     Chest wall: No tenderness.  Abdominal:     General: Bowel sounds are normal. There is no distension.     Palpations: Abdomen is soft. There is no mass.     Tenderness: There is no abdominal tenderness. There is no guarding or rebound.  Musculoskeletal:        General: Tenderness present. Normal range of motion.     Cervical back: Normal range of motion.  Lymphadenopathy:     Cervical: No cervical adenopathy.  Skin:    General: Skin is warm and dry.     Findings: No rash.  Neurological:     Mental Status: He is alert and oriented to person, place, and time.     Cranial Nerves: No cranial nerve deficit.     Motor: No abnormal muscle tone.     Coordination: Coordination normal.     Gait: Gait abnormal.     Deep Tendon Reflexes: Reflexes are normal and symmetric.  Psychiatric:        Behavior: Behavior normal.        Thought Content: Thought content normal.        Judgment: Judgment normal.   Lee Owens is stiff, pain with range of motion, pain is worse after sitting in a chair, antalgic gait  Lab Results  Component Value Date   WBC 6.9 09/18/2018   HGB 15.4 09/18/2018   HCT 45.2 09/18/2018   PLT 225.0 09/18/2018   GLUCOSE 93 05/05/2020   CHOL 165 05/05/2020   TRIG 87 05/05/2020   HDL 54 05/05/2020   LDLCALC 95 05/05/2020   ALT 27 09/18/2018   AST 21 09/18/2018   NA 139 05/05/2020   K 4.7 05/05/2020   CL 98 05/05/2020   CREATININE 1.06 05/05/2020   BUN 10 05/05/2020   CO2 22 05/05/2020   TSH 1.45 09/18/2018   PSA 0.76 06/29/2016   INR 0.96 06/30/2013   HGBA1C 5.5 09/26/2018    DG Tibia/Fibula  Left  Result Date: 06/13/2020 CLINICAL DATA:  Chronic left leg pain which has worsened over the past week. Initial encounter. EXAM: LEFT TIBIA AND FIBULA - 2 VIEW COMPARISON:  Plain films left foot 10/01/2017. FINDINGS: There is no evidence of fracture or other focal bone lesions. Left knee arthroplasty is normal in appearance. Plantar calcaneal spur is  incidentally noted and unchanged. Soft tissues are unremarkable. IMPRESSION: Negative exam. Electronically Signed   By: Inge Rise M.D.   On: 06/13/2020 13:59    Assessment & Plan:   Walker Kehr, MD

## 2020-06-17 NOTE — Assessment & Plan Note (Signed)
Duragesic, Oxy IR  Potential benefits of a long term opioids use as well as potential risks (i.e. addiction risk, apnea etc) and complications (i.e. Somnolence, constipation and others) were explained to the patient and were aknowledged. Diazepam for muscle spasms.  Not to be taken together with opioids

## 2020-06-17 NOTE — Assessment & Plan Note (Signed)
On B12 

## 2020-06-18 LAB — COMPREHENSIVE METABOLIC PANEL
ALT: 19 U/L (ref 0–53)
AST: 16 U/L (ref 0–37)
Albumin: 4.2 g/dL (ref 3.5–5.2)
Alkaline Phosphatase: 60 U/L (ref 39–117)
BUN: 10 mg/dL (ref 6–23)
CO2: 30 mEq/L (ref 19–32)
Calcium: 9.3 mg/dL (ref 8.4–10.5)
Chloride: 100 mEq/L (ref 96–112)
Creatinine, Ser: 1.06 mg/dL (ref 0.40–1.50)
GFR: 71.76 mL/min (ref >60.00)
Glucose, Bld: 106 mg/dL — ABNORMAL HIGH (ref 70–99)
Potassium: 4.6 mEq/L (ref 3.5–5.1)
Sodium: 137 mEq/L (ref 135–145)
Total Bilirubin: 0.7 mg/dL (ref 0.2–1.2)
Total Protein: 6.5 g/dL (ref 6.0–8.3)

## 2020-06-18 LAB — D-DIMER, QUANTITATIVE: D-Dimer, Quant: 0.2 mcg/mL FEU (ref <0.50)

## 2020-06-27 ENCOUNTER — Other Ambulatory Visit: Payer: Self-pay | Admitting: Internal Medicine

## 2020-07-07 ENCOUNTER — Other Ambulatory Visit: Payer: Self-pay | Admitting: Internal Medicine

## 2020-07-19 ENCOUNTER — Other Ambulatory Visit: Payer: Self-pay | Admitting: Internal Medicine

## 2020-07-20 ENCOUNTER — Other Ambulatory Visit: Payer: Self-pay | Admitting: Internal Medicine

## 2020-07-21 ENCOUNTER — Other Ambulatory Visit: Payer: Self-pay | Admitting: Internal Medicine

## 2020-07-21 NOTE — Telephone Encounter (Signed)
Medication has been d/c. Pt is taking Diazepam. Lat filled alprazolam 04/19/2019.Marland KitchenJohny Chess

## 2020-07-26 ENCOUNTER — Other Ambulatory Visit: Payer: Self-pay | Admitting: Internal Medicine

## 2020-07-26 MED ORDER — DIAZEPAM 10 MG PO TABS: ORAL_TABLET | ORAL | 2 refills | Status: DC

## 2020-07-26 NOTE — Telephone Encounter (Signed)
Patients wife called and said that the patient is almost out of diazepam (VALIUM) 10 MG tablet. She said that he has a few pills left. It can be sent to [facility], [address] AT [place]. Please advise

## 2020-08-16 ENCOUNTER — Other Ambulatory Visit: Payer: Self-pay | Admitting: Internal Medicine

## 2020-08-17 DIAGNOSIS — Z23 Encounter for immunization: Secondary | ICD-10-CM | POA: Diagnosis not present

## 2020-09-07 DIAGNOSIS — M170 Bilateral primary osteoarthritis of knee: Secondary | ICD-10-CM | POA: Diagnosis not present

## 2020-09-07 DIAGNOSIS — M25552 Pain in left hip: Secondary | ICD-10-CM | POA: Diagnosis not present

## 2020-09-07 DIAGNOSIS — M5416 Radiculopathy, lumbar region: Secondary | ICD-10-CM | POA: Diagnosis not present

## 2020-09-16 ENCOUNTER — Encounter: Payer: Self-pay | Admitting: Internal Medicine

## 2020-09-16 ENCOUNTER — Other Ambulatory Visit: Payer: Self-pay

## 2020-09-16 ENCOUNTER — Ambulatory Visit (INDEPENDENT_AMBULATORY_CARE_PROVIDER_SITE_OTHER): Payer: Medicare Other | Admitting: Internal Medicine

## 2020-09-16 DIAGNOSIS — I2510 Atherosclerotic heart disease of native coronary artery without angina pectoris: Secondary | ICD-10-CM

## 2020-09-16 DIAGNOSIS — M5442 Lumbago with sciatica, left side: Secondary | ICD-10-CM

## 2020-09-16 DIAGNOSIS — E538 Deficiency of other specified B group vitamins: Secondary | ICD-10-CM

## 2020-09-16 DIAGNOSIS — J0100 Acute maxillary sinusitis, unspecified: Secondary | ICD-10-CM | POA: Diagnosis not present

## 2020-09-16 DIAGNOSIS — F4321 Adjustment disorder with depressed mood: Secondary | ICD-10-CM | POA: Diagnosis not present

## 2020-09-16 DIAGNOSIS — G8929 Other chronic pain: Secondary | ICD-10-CM | POA: Diagnosis not present

## 2020-09-16 DIAGNOSIS — J019 Acute sinusitis, unspecified: Secondary | ICD-10-CM | POA: Insufficient documentation

## 2020-09-16 DIAGNOSIS — F419 Anxiety disorder, unspecified: Secondary | ICD-10-CM

## 2020-09-16 DIAGNOSIS — M5441 Lumbago with sciatica, right side: Secondary | ICD-10-CM | POA: Diagnosis not present

## 2020-09-16 MED ORDER — OXYCODONE HCL 5 MG PO TABS: 10.0000 mg | ORAL_TABLET | Freq: Three times a day (TID) | ORAL | 0 refills | Status: DC | PRN

## 2020-09-16 MED ORDER — CEFDINIR 300 MG PO CAPS: 300.0000 mg | ORAL_CAPSULE | Freq: Two times a day (BID) | ORAL | 0 refills | Status: DC

## 2020-09-16 MED ORDER — FENTANYL 25 MCG/HR TD PT72: 1.0000 | MEDICATED_PATCH | TRANSDERMAL | 0 refills | Status: DC

## 2020-09-16 NOTE — Assessment & Plan Note (Signed)
On B12 

## 2020-09-16 NOTE — Progress Notes (Signed)
Subjective:  Patient ID: Lee Owens, male    DOB: 1950-10-27  Age: 70 y.o. MRN: [id number]  CC: No chief complaint on file.   HPI Mikias Lanz presents for, muscle spasms, anxiety follow-up Sister died 2 wks ago  from dementia: He has been stressed, grieving C/o HA x 4 wks  Outpatient Medications Prior to Visit  Medication Sig Dispense Refill   allopurinol (ZYLOPRIM) 300 MG tablet TAKE 1 TABLET(300 MG) BY MOUTH DAILY 90 tablet 3   buPROPion (WELLBUTRIN XL) 150 MG 24 hr tablet TAKE 2 TABLETS(300 MG) BY MOUTH DAILY- KEEP SCHEDULED APPT OR REFILLS 60 tablet 0   Cholecalciferol (VITAMIN D-3) 5000 UNITS TABS Take 5,000 Units by mouth daily.     Coenzyme Q10 (COQ10) 400 MG CAPS 1 po qd 100 capsule 3   diazepam (VALIUM) 10 MG tablet TAKE 1/2 TO 1 TABLET(5 TO 10 MG) BY MOUTH EVERY 8 HOURS AS NEEDED FOR ANXIETY OR MUSCLE SPASMS 90 tablet 2   DULoxetine (CYMBALTA) 60 MG capsule Take 1 capsule (60 mg total) by mouth 2 (two) times daily. 60 capsule 5   fentaNYL (DURAGESIC) 25 MCG/HR Place 1 patch onto the skin every other day. 15 patch 0   finasteride (PROSCAR) 5 MG tablet Take 5 mg by mouth daily.     gabapentin (NEURONTIN) 300 MG capsule Take 1 capsule (300 mg total) by mouth 3 (three) times daily. 270 capsule 3   L-Methylfolate-Algae 15-90.314 MG CAPS TAKE 1 CAPSULE BY MOUTH ONCE DAILY 30 capsule 11   lovastatin (MEVACOR) 40 MG tablet TAKE 1 TABLET(40 MG) BY MOUTH AT BEDTIME 90 tablet 1   NARCAN 4 MG/0.1ML LIQD nasal spray kit as needed.  0   oxyCODONE (OXY IR/ROXICODONE) 5 MG immediate release tablet Take 2 tablets (10 mg total) by mouth every 8 (eight) hours as needed for severe pain (chronic back pain). 180 tablet 0   tamsulosin (FLOMAX) 0.4 MG CAPS capsule Take 1 capsule by mouth daily.  2   temazepam (RESTORIL) 30 MG capsule TAKE 1 TO 2 CAPSULES BY MOUTH AT BEDTIME AS NEEDED FOR SLEEP 180 capsule 0   vitamin B-12 (CYANOCOBALAMIN) 1000 MCG tablet Take 2,000 mcg by mouth daily.      fentaNYL (DURAGESIC) 25 MCG/HR Place 1 patch onto the skin every other day. 15 patch 0   fentaNYL (DURAGESIC) 25 MCG/HR Place 1 patch onto the skin every other day. 15 patch 0   No facility-administered medications prior to visit.    ROS: Review of Systems  Constitutional:  Positive for fatigue. Negative for appetite change and unexpected weight change.  HENT:  Negative for congestion, nosebleeds, sneezing, sore throat and trouble swallowing.   Eyes:  Negative for itching and visual disturbance.  Respiratory:  Negative for cough.   Cardiovascular:  Negative for chest pain, palpitations and leg swelling.  Gastrointestinal:  Negative for abdominal distention, blood in stool, diarrhea and nausea.  Genitourinary:  Negative for frequency and hematuria.  Musculoskeletal:  Positive for arthralgias, back pain, gait problem, myalgias, neck pain and neck stiffness. Negative for joint swelling.  Skin:  Negative for rash.  Neurological:  Positive for weakness and headaches. Negative for dizziness, tremors and speech difficulty.  Psychiatric/Behavioral:  Positive for decreased concentration and dysphoric mood. Negative for agitation, behavioral problems, sleep disturbance and suicidal ideas. The patient is nervous/anxious.    Objective:  BP (!) 130/92 (BP Location: Left Arm)   Pulse 100   Temp 98.1 F (36.7 C) (Oral)   Ht  6' 1" (1.854 m)   Wt 234 lb 3.2 oz (106.2 kg)   SpO2 95%   BMI 30.90 kg/m   BP Readings from Last 3 Encounters:  09/16/20 (!) 130/92  06/17/20 (!) 142/82  06/13/20 (!) 144/85    Wt Readings from Last 3 Encounters:  09/16/20 234 lb 3.2 oz (106.2 kg)  06/17/20 236 lb 6.4 oz (107.2 kg)  05/05/20 239 lb 6.4 oz (108.6 kg)    Physical Exam Constitutional:      General: He is not in acute distress.    Appearance: He is well-developed.     Comments: NAD  Eyes:     Conjunctiva/sclera: Conjunctivae normal.     Pupils: Pupils are equal, round, and reactive to light.   Neck:     Thyroid: No thyromegaly.     Vascular: No JVD.  Cardiovascular:     Rate and Rhythm: Normal rate and regular rhythm.     Heart sounds: Normal heart sounds. No murmur heard.   No friction rub. No gallop.  Pulmonary:     Effort: Pulmonary effort is normal. No respiratory distress.     Breath sounds: Normal breath sounds. No wheezing or rales.  Chest:     Chest wall: No tenderness.  Abdominal:     General: Bowel sounds are normal. There is no distension.     Palpations: Abdomen is soft. There is no mass.     Tenderness: There is no abdominal tenderness. There is no guarding or rebound.  Musculoskeletal:        General: Tenderness present. Normal range of motion.     Cervical back: Normal range of motion. Tenderness present.  Lymphadenopathy:     Cervical: No cervical adenopathy.  Skin:    General: Skin is warm and dry.     Findings: No rash.  Neurological:     Mental Status: He is alert and oriented to person, place, and time.     Cranial Nerves: No cranial nerve deficit.     Motor: No abnormal muscle tone.     Coordination: Coordination normal.     Gait: Gait normal.     Deep Tendon Reflexes: Reflexes are normal and symmetric.  Psychiatric:        Behavior: Behavior normal.        Thought Content: Thought content normal.        Judgment: Judgment normal.  LS tender Traps are tender he is tearful Antalgic gait Knees with arthritis deformities  Lab Results  Component Value Date   WBC 6.9 09/18/2018   HGB 15.4 09/18/2018   HCT 45.2 09/18/2018   PLT 225.0 09/18/2018   GLUCOSE 106 (H) 06/17/2020   CHOL 165 05/05/2020   TRIG 87 05/05/2020   HDL 54 05/05/2020   LDLCALC 95 05/05/2020   ALT 19 06/17/2020   AST 16 06/17/2020   NA 137 06/17/2020   K 4.6 06/17/2020   CL 100 06/17/2020   CREATININE 1.06 06/17/2020   BUN 10 06/17/2020   CO2 30 06/17/2020   TSH 1.45 09/18/2018   PSA 0.76 06/29/2016   INR 0.96 06/30/2013   HGBA1C 5.5 09/26/2018    DG  Tibia/Fibula Left  Result Date: 06/13/2020 CLINICAL DATA:  Chronic left leg pain which has worsened over the past week. Initial encounter. EXAM: LEFT TIBIA AND FIBULA - 2 VIEW COMPARISON:  Plain films left foot 10/01/2017. FINDINGS: There is no evidence of fracture or other focal bone lesions. Left knee arthroplasty is normal in appearance.  Plantar calcaneal spur is incidentally noted and unchanged. Soft tissues are unremarkable. IMPRESSION: Negative exam. Electronically Signed   By: Thomas  Dalessio M.D.   On: 06/13/2020 13:59    Assessment & Plan:     Follow-up: No follow-ups on file.  Alex Plotnikov, MD  

## 2020-09-16 NOTE — Assessment & Plan Note (Signed)
Diazepam prn  Potential benefits of a long term benzodiazepines  use as well as potential risks  and complications were explained to the patient and were aknowledged. 

## 2020-09-16 NOTE — Assessment & Plan Note (Addendum)
Probably responsible for the headache.  Start [employer]

## 2020-09-16 NOTE — Assessment & Plan Note (Addendum)
Chronic - worse  Duragesic, Oxy IR  Potential benefits of a long term opioids use as well as potential risks (i.e. addiction risk, apnea etc) and complications (i.e. Somnolence, constipation and others) were explained to the patient and were aknowledged. Diazepam for muscle spasms.  Not to be taken together with opioids

## 2020-09-18 ENCOUNTER — Other Ambulatory Visit: Payer: Self-pay | Admitting: Internal Medicine

## 2020-09-20 DIAGNOSIS — F4321 Adjustment disorder with depressed mood: Secondary | ICD-10-CM | POA: Insufficient documentation

## 2020-09-20 NOTE — Assessment & Plan Note (Signed)
Sister died 2 wks ago  from dementia: He has been stressed, grieving.  Discussed with [name] and [facility]

## 2020-10-17 ENCOUNTER — Other Ambulatory Visit: Payer: Self-pay | Admitting: Internal Medicine

## 2020-10-19 ENCOUNTER — Telehealth: Payer: Self-pay | Admitting: Internal Medicine

## 2020-10-19 MED ORDER — FENTANYL 25 MCG/HR TD PT72: 1.0000 | MEDICATED_PATCH | TRANSDERMAL | 0 refills | Status: DC

## 2020-10-19 NOTE — Telephone Encounter (Signed)
[facility] on [address] has called stating they are out of the FENTANYL in their location; please send it to :   [address], [place], [place] [address]

## 2020-10-19 NOTE — Telephone Encounter (Signed)
Okay.  Thanks.

## 2020-11-04 ENCOUNTER — Other Ambulatory Visit: Payer: Self-pay | Admitting: Internal Medicine

## 2020-11-22 ENCOUNTER — Telehealth: Payer: Self-pay | Admitting: Internal Medicine

## 2020-11-22 NOTE — Telephone Encounter (Signed)
Left message for patient to call me back at [phone] to schedule Medicare Annual Wellness Visit   No hx of AWV eligible as of 03/27/17  Please schedule at anytime with [facility] Advisor if patient calls the office back.    40 Minutes appointment   Any questions, please call me at [phone]

## 2020-12-06 ENCOUNTER — Other Ambulatory Visit: Payer: Self-pay | Admitting: Internal Medicine

## 2020-12-09 DIAGNOSIS — M6281 Muscle weakness (generalized): Secondary | ICD-10-CM | POA: Diagnosis not present

## 2020-12-09 DIAGNOSIS — G894 Chronic pain syndrome: Secondary | ICD-10-CM | POA: Diagnosis not present

## 2020-12-09 DIAGNOSIS — M542 Cervicalgia: Secondary | ICD-10-CM | POA: Diagnosis not present

## 2020-12-09 DIAGNOSIS — Z96653 Presence of artificial knee joint, bilateral: Secondary | ICD-10-CM | POA: Diagnosis not present

## 2020-12-14 ENCOUNTER — Other Ambulatory Visit: Payer: Self-pay

## 2020-12-14 ENCOUNTER — Ambulatory Visit (INDEPENDENT_AMBULATORY_CARE_PROVIDER_SITE_OTHER): Payer: Medicare Other | Admitting: Internal Medicine

## 2020-12-14 ENCOUNTER — Encounter: Payer: Self-pay | Admitting: Internal Medicine

## 2020-12-14 VITALS — BP 142/86 | HR 81 | Temp 98.3°F | Ht 73.0 in | Wt 235.0 lb

## 2020-12-14 DIAGNOSIS — G894 Chronic pain syndrome: Secondary | ICD-10-CM | POA: Diagnosis not present

## 2020-12-14 DIAGNOSIS — G8929 Other chronic pain: Secondary | ICD-10-CM

## 2020-12-14 DIAGNOSIS — M5441 Lumbago with sciatica, right side: Secondary | ICD-10-CM

## 2020-12-14 DIAGNOSIS — F332 Major depressive disorder, recurrent severe without psychotic features: Secondary | ICD-10-CM | POA: Diagnosis not present

## 2020-12-14 DIAGNOSIS — M791 Myalgia, unspecified site: Secondary | ICD-10-CM | POA: Diagnosis not present

## 2020-12-14 DIAGNOSIS — I2510 Atherosclerotic heart disease of native coronary artery without angina pectoris: Secondary | ICD-10-CM | POA: Diagnosis not present

## 2020-12-14 DIAGNOSIS — E538 Deficiency of other specified B group vitamins: Secondary | ICD-10-CM

## 2020-12-14 DIAGNOSIS — F419 Anxiety disorder, unspecified: Secondary | ICD-10-CM

## 2020-12-14 DIAGNOSIS — M5442 Lumbago with sciatica, left side: Secondary | ICD-10-CM

## 2020-12-14 DIAGNOSIS — M7989 Other specified soft tissue disorders: Secondary | ICD-10-CM

## 2020-12-14 DIAGNOSIS — M542 Cervicalgia: Secondary | ICD-10-CM | POA: Diagnosis not present

## 2020-12-14 DIAGNOSIS — R739 Hyperglycemia, unspecified: Secondary | ICD-10-CM

## 2020-12-14 DIAGNOSIS — Z96653 Presence of artificial knee joint, bilateral: Secondary | ICD-10-CM | POA: Diagnosis not present

## 2020-12-14 DIAGNOSIS — M6281 Muscle weakness (generalized): Secondary | ICD-10-CM | POA: Diagnosis not present

## 2020-12-14 LAB — CBC WITH DIFFERENTIAL/PLATELET
Basophils Absolute: 0 10*3/uL (ref 0.0–0.1)
Basophils Relative: 0.6 % (ref 0.0–3.0)
Eosinophils Absolute: 0.2 10*3/uL (ref 0.0–0.7)
Eosinophils Relative: 2.9 % (ref 0.0–5.0)
HCT: 44.1 % (ref 39.0–52.0)
Hemoglobin: 14.8 g/dL (ref 13.0–17.0)
Lymphocytes Relative: 21.4 % (ref 12.0–46.0)
Lymphs Abs: 1.3 10*3/uL (ref 0.7–4.0)
MCHC: 33.6 g/dL (ref 30.0–36.0)
MCV: 93.5 fl (ref 78.0–100.0)
Monocytes Absolute: 0.4 10*3/uL (ref 0.1–1.0)
Monocytes Relative: 6.2 % (ref 3.0–12.0)
Neutro Abs: 4.3 10*3/uL (ref 1.4–7.7)
Neutrophils Relative %: 68.9 % (ref 43.0–77.0)
Platelets: 205 10*3/uL (ref 150.0–400.0)
RBC: 4.72 Mil/uL (ref 4.22–5.81)
RDW: 13.8 % (ref 11.5–15.5)
WBC: 6.3 10*3/uL (ref 4.0–10.5)

## 2020-12-14 LAB — LIPID PANEL
Cholesterol: 156 mg/dL (ref 0–200)
HDL: 52 mg/dL (ref >39.00)
LDL Cholesterol: 83 mg/dL (ref 0–99)
NonHDL: 103.84
Total CHOL/HDL Ratio: 3
Triglycerides: 102 mg/dL (ref 0.0–149.0)
VLDL: 20.4 mg/dL (ref 0.0–40.0)

## 2020-12-14 LAB — COMPREHENSIVE METABOLIC PANEL
ALT: 26 U/L (ref 0–53)
AST: 20 U/L (ref 0–37)
Albumin: 4.2 g/dL (ref 3.5–5.2)
Alkaline Phosphatase: 64 U/L (ref 39–117)
BUN: 9 mg/dL (ref 6–23)
CO2: 30 mEq/L (ref 19–32)
Calcium: 9.5 mg/dL (ref 8.4–10.5)
Chloride: 103 mEq/L (ref 96–112)
Creatinine, Ser: 0.95 mg/dL (ref 0.40–1.50)
GFR: 81.56 mL/min (ref >60.00)
Glucose, Bld: 99 mg/dL (ref 70–99)
Potassium: 4.4 mEq/L (ref 3.5–5.1)
Sodium: 140 mEq/L (ref 135–145)
Total Bilirubin: 0.6 mg/dL (ref 0.2–1.2)
Total Protein: 6.8 g/dL (ref 6.0–8.3)

## 2020-12-14 LAB — HEMOGLOBIN A1C: Hgb A1c MFr Bld: 5.7 % (ref 4.6–6.5)

## 2020-12-14 LAB — TSH: TSH: 1 u[IU]/mL (ref 0.35–5.50)

## 2020-12-14 MED ORDER — OXYCODONE HCL 5 MG PO TABS: 10.0000 mg | ORAL_TABLET | Freq: Three times a day (TID) | ORAL | 0 refills | Status: DC | PRN

## 2020-12-14 MED ORDER — FENTANYL 25 MCG/HR TD PT72: 1.0000 | MEDICATED_PATCH | TRANSDERMAL | 0 refills | Status: DC

## 2020-12-14 NOTE — Assessment & Plan Note (Signed)
Compression socks 

## 2020-12-14 NOTE — Assessment & Plan Note (Signed)
Try trekking poles TENs unit Try Rollator walker w/PT Cont w/PT

## 2020-12-14 NOTE — Assessment & Plan Note (Signed)
On vit B12/B complex

## 2020-12-14 NOTE — Assessment & Plan Note (Signed)
Spastic muscles Duragesic, Oxy IR  Potential benefits of a long term opioids use as well as potential risks (i.e. addiction risk, apnea etc) and complications (i.e. Somnolence, constipation and others) were explained to the patient and were aknowledged

## 2020-12-14 NOTE — Assessment & Plan Note (Signed)
Diazepam prn  Potential benefits of a long term benzodiazepines  use as well as potential risks  and complications were explained to the patient and were aknowledged. 

## 2020-12-14 NOTE — Addendum Note (Signed)
Addended by: Jacobo Forest on: 12/14/2020 02:30 PM   Modules accepted: Orders

## 2020-12-14 NOTE — Assessment & Plan Note (Signed)
Using a Fisher-Wallace device - it is helping

## 2020-12-14 NOTE — Progress Notes (Signed)
Subjective:  Patient ID: Lee Owens, male    DOB: 12-20-1950  Age: 70 y.o. MRN: [id number]  CC: Follow-up (3 month f/u)   HPI Rayvion Stumph presents for chronic pain, spasms, gait disorder, depression In PT [facility]  Can walk 100 ft w/o stopping C/o edema  Outpatient Medications Prior to Visit  Medication Sig Dispense Refill   allopurinol (ZYLOPRIM) 300 MG tablet TAKE 1 TABLET(300 MG) BY MOUTH DAILY 90 tablet 3   buPROPion (WELLBUTRIN XL) 150 MG 24 hr tablet TAKE 2 TABLETS(300 MG) BY MOUTH DAILY 180 tablet 1   Cholecalciferol (VITAMIN D-3) 5000 UNITS TABS Take 5,000 Units by mouth daily.     Coenzyme Q10 (COQ10) 400 MG CAPS 1 po qd 100 capsule 3   diazepam (VALIUM) 10 MG tablet TAKE 1/2 TO 1 TABLET(5 TO 10 MG) BY MOUTH EVERY 8 HOURS AS NEEDED FOR ANXIETY OR MUSCLE SPASMS 90 tablet 2   DULoxetine (CYMBALTA) 60 MG capsule Take 1 capsule (60 mg total) by mouth 2 (two) times daily. 60 capsule 5   finasteride (PROSCAR) 5 MG tablet Take 5 mg by mouth daily.     gabapentin (NEURONTIN) 300 MG capsule Take 1 capsule (300 mg total) by mouth 3 (three) times daily. 270 capsule 3   L-Methylfolate-Algae 15-90.314 MG CAPS TAKE 1 CAPSULE BY MOUTH ONCE DAILY 30 capsule 11   lovastatin (MEVACOR) 40 MG tablet TAKE 1 TABLET(40 MG) BY MOUTH AT BEDTIME 90 tablet 3   NARCAN 4 MG/0.1ML LIQD nasal spray kit as needed.  0   tamsulosin (FLOMAX) 0.4 MG CAPS capsule Take 1 capsule by mouth daily.  2   temazepam (RESTORIL) 30 MG capsule TAKE 1 TO 2 CAPSULES BY MOUTH AT BEDTIME AS NEEDED FOR SLEEP 180 capsule 1   vitamin B-12 (CYANOCOBALAMIN) 1000 MCG tablet Take 2,000 mcg by mouth daily.     fentaNYL (DURAGESIC) 25 MCG/HR Place 1 patch onto the skin every other day. 15 patch 0   oxyCODONE (OXY IR/ROXICODONE) 5 MG immediate release tablet Take 2 tablets (10 mg total) by mouth every 8 (eight) hours as needed for severe pain (chronic back pain). 180 tablet 0   cefdinir (OMNICEF) 300 MG capsule Take 1 capsule  (300 mg total) by mouth 2 (two) times daily. 20 capsule 0   cefdinir (OMNICEF) 300 MG capsule Take 1 capsule (300 mg total) by mouth 2 (two) times daily. 20 capsule 0   fentaNYL (DURAGESIC) 25 MCG/HR Place 1 patch onto the skin every other day. 15 patch 0   fentaNYL (DURAGESIC) 25 MCG/HR Place 1 patch onto the skin every other day. 15 patch 0   oxyCODONE (OXY IR/ROXICODONE) 5 MG immediate release tablet Take 2 tablets (10 mg total) by mouth every 8 (eight) hours as needed for severe pain (chronic back pain). 180 tablet 0   oxyCODONE (OXY IR/ROXICODONE) 5 MG immediate release tablet Take 2 tablets (10 mg total) by mouth every 8 (eight) hours as needed for severe pain (chronic back pain). 180 tablet 0   No facility-administered medications prior to visit.    ROS: Review of Systems  Constitutional:  Positive for fatigue. Negative for appetite change.  HENT:  Negative for congestion, nosebleeds, sneezing, sore throat and trouble swallowing.   Eyes:  Negative for itching and visual disturbance.  Respiratory:  Negative for cough.   Cardiovascular:  Negative for chest pain, palpitations and leg swelling.  Gastrointestinal:  Negative for abdominal distention, blood in stool, diarrhea and nausea.  Genitourinary:  Negative  for frequency and hematuria.  Musculoskeletal:  Positive for arthralgias and gait problem. Negative for back pain, joint swelling and neck pain.  Skin:  Negative for rash.  Neurological:  Negative for dizziness, tremors, speech difficulty and weakness.  Psychiatric/Behavioral:  Negative for agitation, dysphoric mood and sleep disturbance. The patient is not nervous/anxious.    Objective:  BP (!) 142/86 (BP Location: Left Arm)   Pulse 81   Temp 98.3 F (36.8 C) (Oral)   Ht 6' 1"  (1.854 m)   Wt 235 lb (106.6 kg)   SpO2 94%   BMI 31.00 kg/m   BP Readings from Last 3 Encounters:  12/14/20 (!) 142/86  09/16/20 (!) 130/92  06/17/20 (!) 142/82    Wt Readings from Last 3  Encounters:  12/14/20 235 lb (106.6 kg)  09/16/20 234 lb 3.2 oz (106.2 kg)  06/17/20 236 lb 6.4 oz (107.2 kg)    Physical Exam Constitutional:      General: He is not in acute distress.    Appearance: He is well-developed. He is obese.     Comments: NAD  Eyes:     Conjunctiva/sclera: Conjunctivae normal.     Pupils: Pupils are equal, round, and reactive to light.  Neck:     Thyroid: No thyromegaly.     Vascular: No JVD.  Cardiovascular:     Rate and Rhythm: Normal rate and regular rhythm.     Heart sounds: Normal heart sounds. No murmur heard.   No friction rub. No gallop.  Pulmonary:     Effort: Pulmonary effort is normal. No respiratory distress.     Breath sounds: Normal breath sounds. No wheezing or rales.  Chest:     Chest wall: No tenderness.  Abdominal:     General: Bowel sounds are normal. There is no distension.     Palpations: Abdomen is soft. There is no mass.     Tenderness: There is no abdominal tenderness. There is no guarding or rebound.  Musculoskeletal:        General: Tenderness present. Normal range of motion.     Cervical back: Normal range of motion.  Lymphadenopathy:     Cervical: No cervical adenopathy.  Skin:    General: Skin is warm and dry.     Findings: No rash.  Neurological:     Mental Status: He is alert and oriented to person, place, and time.     Cranial Nerves: No cranial nerve deficit.     Motor: Weakness present. No abnormal muscle tone.     Coordination: Coordination normal.     Gait: Gait abnormal.     Deep Tendon Reflexes: Reflexes are normal and symmetric.  Psychiatric:        Mood and Affect: Mood normal.        Behavior: Behavior normal.        Thought Content: Thought content normal.        Judgment: Judgment normal.    Lab Results  Component Value Date   WBC 6.9 09/18/2018   HGB 15.4 09/18/2018   HCT 45.2 09/18/2018   PLT 225.0 09/18/2018   GLUCOSE 106 (H) 06/17/2020   CHOL 165 05/05/2020   TRIG 87 05/05/2020    HDL 54 05/05/2020   LDLCALC 95 05/05/2020   ALT 19 06/17/2020   AST 16 06/17/2020   NA 137 06/17/2020   K 4.6 06/17/2020   CL 100 06/17/2020   CREATININE 1.06 06/17/2020   BUN 10 06/17/2020   CO2 30 06/17/2020  TSH 1.45 09/18/2018   PSA 0.76 06/29/2016   INR 0.96 06/30/2013   HGBA1C 5.5 09/26/2018    DG Tibia/Fibula Left  Result Date: 06/13/2020 CLINICAL DATA:  Chronic left leg pain which has worsened over the past week. Initial encounter. EXAM: LEFT TIBIA AND FIBULA - 2 VIEW COMPARISON:  Plain films left foot 10/01/2017. FINDINGS: There is no evidence of fracture or other focal bone lesions. Left knee arthroplasty is normal in appearance. Plantar calcaneal spur is incidentally noted and unchanged. Soft tissues are unremarkable. IMPRESSION: Negative exam. Electronically Signed   By: Inge Rise M.D.   On: 06/13/2020 13:59    Assessment & Plan:   Problem List Items Addressed This Visit     Anxiety    Diazepam prn  Potential benefits of a long term benzodiazepines  use as well as potential risks  and complications were explained to the patient and were aknowledged.      Depression    Using a Fisher-[employer]ace device - it is helping      Leg swelling    Compression socks      Low back pain    Try trekking poles TENs unit Try Rollator walker w/PT Cont w/PT      Relevant Medications   fentaNYL (DURAGESIC) 25 MCG/HR   oxyCODONE (OXY IR/ROXICODONE) 5 MG immediate release tablet   oxyCODONE (OXY IR/ROXICODONE) 5 MG immediate release tablet   oxyCODONE (OXY IR/ROXICODONE) 5 MG immediate release tablet   fentaNYL (DURAGESIC) 25 MCG/HR   fentaNYL (DURAGESIC) 25 MCG/HR   Myalgia    Spastic muscles Duragesic, Oxy IR  Potential benefits of a long term opioids use as well as potential risks (i.e. addiction risk, apnea etc) and complications (i.e. Somnolence, constipation and others) were explained to the patient and were aknowledged      Vitamin B12 deficiency    On vit  B12/B complex         Follow-up: Return in about 3 months (around 03/15/2021) for a follow-up visit.  Walker Kehr, MD

## 2020-12-15 DIAGNOSIS — M542 Cervicalgia: Secondary | ICD-10-CM | POA: Diagnosis not present

## 2020-12-15 DIAGNOSIS — Z96653 Presence of artificial knee joint, bilateral: Secondary | ICD-10-CM | POA: Diagnosis not present

## 2020-12-15 DIAGNOSIS — G894 Chronic pain syndrome: Secondary | ICD-10-CM | POA: Diagnosis not present

## 2020-12-15 DIAGNOSIS — M6281 Muscle weakness (generalized): Secondary | ICD-10-CM | POA: Diagnosis not present

## 2020-12-15 LAB — URINALYSIS
Bilirubin Urine: NEGATIVE
Hgb urine dipstick: NEGATIVE
Ketones, ur: NEGATIVE
Leukocytes,Ua: NEGATIVE
Nitrite: NEGATIVE
Specific Gravity, Urine: 1.02 (ref 1.000–1.030)
Total Protein, Urine: NEGATIVE
Urine Glucose: NEGATIVE
Urobilinogen, UA: 0.2 — AB (ref 0.0–1.0)
pH: 6 (ref 5.0–8.0)

## 2020-12-16 ENCOUNTER — Ambulatory Visit: Payer: Medicare Other | Admitting: Internal Medicine

## 2020-12-20 DIAGNOSIS — M6281 Muscle weakness (generalized): Secondary | ICD-10-CM | POA: Diagnosis not present

## 2020-12-20 DIAGNOSIS — Z96653 Presence of artificial knee joint, bilateral: Secondary | ICD-10-CM | POA: Diagnosis not present

## 2020-12-20 DIAGNOSIS — G894 Chronic pain syndrome: Secondary | ICD-10-CM | POA: Diagnosis not present

## 2020-12-20 DIAGNOSIS — M542 Cervicalgia: Secondary | ICD-10-CM | POA: Diagnosis not present

## 2020-12-22 DIAGNOSIS — R3914 Feeling of incomplete bladder emptying: Secondary | ICD-10-CM | POA: Diagnosis not present

## 2020-12-22 DIAGNOSIS — N401 Enlarged prostate with lower urinary tract symptoms: Secondary | ICD-10-CM | POA: Diagnosis not present

## 2020-12-22 DIAGNOSIS — N5201 Erectile dysfunction due to arterial insufficiency: Secondary | ICD-10-CM | POA: Diagnosis not present

## 2020-12-24 DIAGNOSIS — M6281 Muscle weakness (generalized): Secondary | ICD-10-CM | POA: Diagnosis not present

## 2020-12-24 DIAGNOSIS — Z96653 Presence of artificial knee joint, bilateral: Secondary | ICD-10-CM | POA: Diagnosis not present

## 2020-12-24 DIAGNOSIS — G894 Chronic pain syndrome: Secondary | ICD-10-CM | POA: Diagnosis not present

## 2020-12-24 DIAGNOSIS — M542 Cervicalgia: Secondary | ICD-10-CM | POA: Diagnosis not present

## 2020-12-27 DIAGNOSIS — M542 Cervicalgia: Secondary | ICD-10-CM | POA: Diagnosis not present

## 2020-12-27 DIAGNOSIS — Z96653 Presence of artificial knee joint, bilateral: Secondary | ICD-10-CM | POA: Diagnosis not present

## 2020-12-27 DIAGNOSIS — G894 Chronic pain syndrome: Secondary | ICD-10-CM | POA: Diagnosis not present

## 2020-12-27 DIAGNOSIS — M6281 Muscle weakness (generalized): Secondary | ICD-10-CM | POA: Diagnosis not present

## 2020-12-29 DIAGNOSIS — G894 Chronic pain syndrome: Secondary | ICD-10-CM | POA: Diagnosis not present

## 2020-12-29 DIAGNOSIS — M6281 Muscle weakness (generalized): Secondary | ICD-10-CM | POA: Diagnosis not present

## 2020-12-29 DIAGNOSIS — M542 Cervicalgia: Secondary | ICD-10-CM | POA: Diagnosis not present

## 2020-12-29 DIAGNOSIS — Z96653 Presence of artificial knee joint, bilateral: Secondary | ICD-10-CM | POA: Diagnosis not present

## 2020-12-31 DIAGNOSIS — M6281 Muscle weakness (generalized): Secondary | ICD-10-CM | POA: Diagnosis not present

## 2020-12-31 DIAGNOSIS — Z96653 Presence of artificial knee joint, bilateral: Secondary | ICD-10-CM | POA: Diagnosis not present

## 2020-12-31 DIAGNOSIS — M542 Cervicalgia: Secondary | ICD-10-CM | POA: Diagnosis not present

## 2020-12-31 DIAGNOSIS — G894 Chronic pain syndrome: Secondary | ICD-10-CM | POA: Diagnosis not present

## 2021-01-03 DIAGNOSIS — M542 Cervicalgia: Secondary | ICD-10-CM | POA: Diagnosis not present

## 2021-01-03 DIAGNOSIS — G894 Chronic pain syndrome: Secondary | ICD-10-CM | POA: Diagnosis not present

## 2021-01-03 DIAGNOSIS — Z96653 Presence of artificial knee joint, bilateral: Secondary | ICD-10-CM | POA: Diagnosis not present

## 2021-01-03 DIAGNOSIS — M6281 Muscle weakness (generalized): Secondary | ICD-10-CM | POA: Diagnosis not present

## 2021-01-05 DIAGNOSIS — M542 Cervicalgia: Secondary | ICD-10-CM | POA: Diagnosis not present

## 2021-01-05 DIAGNOSIS — M6281 Muscle weakness (generalized): Secondary | ICD-10-CM | POA: Diagnosis not present

## 2021-01-05 DIAGNOSIS — Z96653 Presence of artificial knee joint, bilateral: Secondary | ICD-10-CM | POA: Diagnosis not present

## 2021-01-05 DIAGNOSIS — G894 Chronic pain syndrome: Secondary | ICD-10-CM | POA: Diagnosis not present

## 2021-01-14 DIAGNOSIS — Z23 Encounter for immunization: Secondary | ICD-10-CM | POA: Diagnosis not present

## 2021-01-26 DIAGNOSIS — M542 Cervicalgia: Secondary | ICD-10-CM | POA: Diagnosis not present

## 2021-01-26 DIAGNOSIS — Z96653 Presence of artificial knee joint, bilateral: Secondary | ICD-10-CM | POA: Diagnosis not present

## 2021-01-26 DIAGNOSIS — M6281 Muscle weakness (generalized): Secondary | ICD-10-CM | POA: Diagnosis not present

## 2021-01-26 DIAGNOSIS — G894 Chronic pain syndrome: Secondary | ICD-10-CM | POA: Diagnosis not present

## 2021-02-01 DIAGNOSIS — M542 Cervicalgia: Secondary | ICD-10-CM | POA: Diagnosis not present

## 2021-02-01 DIAGNOSIS — M6281 Muscle weakness (generalized): Secondary | ICD-10-CM | POA: Diagnosis not present

## 2021-02-01 DIAGNOSIS — G894 Chronic pain syndrome: Secondary | ICD-10-CM | POA: Diagnosis not present

## 2021-02-01 DIAGNOSIS — Z96653 Presence of artificial knee joint, bilateral: Secondary | ICD-10-CM | POA: Diagnosis not present

## 2021-02-07 DIAGNOSIS — G894 Chronic pain syndrome: Secondary | ICD-10-CM | POA: Diagnosis not present

## 2021-02-07 DIAGNOSIS — Z96653 Presence of artificial knee joint, bilateral: Secondary | ICD-10-CM | POA: Diagnosis not present

## 2021-02-07 DIAGNOSIS — M542 Cervicalgia: Secondary | ICD-10-CM | POA: Diagnosis not present

## 2021-02-07 DIAGNOSIS — M6281 Muscle weakness (generalized): Secondary | ICD-10-CM | POA: Diagnosis not present

## 2021-02-09 DIAGNOSIS — M542 Cervicalgia: Secondary | ICD-10-CM | POA: Diagnosis not present

## 2021-02-09 DIAGNOSIS — Z96653 Presence of artificial knee joint, bilateral: Secondary | ICD-10-CM | POA: Diagnosis not present

## 2021-02-09 DIAGNOSIS — M6281 Muscle weakness (generalized): Secondary | ICD-10-CM | POA: Diagnosis not present

## 2021-02-09 DIAGNOSIS — G894 Chronic pain syndrome: Secondary | ICD-10-CM | POA: Diagnosis not present

## 2021-02-14 DIAGNOSIS — Z96653 Presence of artificial knee joint, bilateral: Secondary | ICD-10-CM | POA: Diagnosis not present

## 2021-02-14 DIAGNOSIS — M6281 Muscle weakness (generalized): Secondary | ICD-10-CM | POA: Diagnosis not present

## 2021-02-14 DIAGNOSIS — G894 Chronic pain syndrome: Secondary | ICD-10-CM | POA: Diagnosis not present

## 2021-02-14 DIAGNOSIS — M542 Cervicalgia: Secondary | ICD-10-CM | POA: Diagnosis not present

## 2021-02-15 ENCOUNTER — Telehealth: Payer: Self-pay | Admitting: Internal Medicine

## 2021-02-15 NOTE — Telephone Encounter (Signed)
Patient's spouse Lee Owens requesting a call back to discuss rx fentaNYL (DURAGESIC) 25 MCG/HR  oxyCODONE (OXY IR/ROXICODONE) 5 MG immediate release tablet prescribe dates

## 2021-02-16 NOTE — Telephone Encounter (Signed)
Called pt wife back she states normally when their is 31 days MD would give enough for 31 days, but he was able to get refilled today. No other issue needed.Marland KitchenJohny Owens

## 2021-02-22 DIAGNOSIS — Z96653 Presence of artificial knee joint, bilateral: Secondary | ICD-10-CM | POA: Diagnosis not present

## 2021-02-22 DIAGNOSIS — M6281 Muscle weakness (generalized): Secondary | ICD-10-CM | POA: Diagnosis not present

## 2021-02-22 DIAGNOSIS — G894 Chronic pain syndrome: Secondary | ICD-10-CM | POA: Diagnosis not present

## 2021-02-22 DIAGNOSIS — M542 Cervicalgia: Secondary | ICD-10-CM | POA: Diagnosis not present

## 2021-02-24 DIAGNOSIS — G894 Chronic pain syndrome: Secondary | ICD-10-CM | POA: Diagnosis not present

## 2021-02-24 DIAGNOSIS — M542 Cervicalgia: Secondary | ICD-10-CM | POA: Diagnosis not present

## 2021-02-24 DIAGNOSIS — Z96653 Presence of artificial knee joint, bilateral: Secondary | ICD-10-CM | POA: Diagnosis not present

## 2021-02-24 DIAGNOSIS — M6281 Muscle weakness (generalized): Secondary | ICD-10-CM | POA: Diagnosis not present

## 2021-03-01 DIAGNOSIS — Z96653 Presence of artificial knee joint, bilateral: Secondary | ICD-10-CM | POA: Diagnosis not present

## 2021-03-01 DIAGNOSIS — M542 Cervicalgia: Secondary | ICD-10-CM | POA: Diagnosis not present

## 2021-03-01 DIAGNOSIS — G894 Chronic pain syndrome: Secondary | ICD-10-CM | POA: Diagnosis not present

## 2021-03-01 DIAGNOSIS — M6281 Muscle weakness (generalized): Secondary | ICD-10-CM | POA: Diagnosis not present

## 2021-03-03 ENCOUNTER — Other Ambulatory Visit: Payer: Self-pay | Admitting: Internal Medicine

## 2021-03-03 DIAGNOSIS — G894 Chronic pain syndrome: Secondary | ICD-10-CM | POA: Diagnosis not present

## 2021-03-03 DIAGNOSIS — Z96653 Presence of artificial knee joint, bilateral: Secondary | ICD-10-CM | POA: Diagnosis not present

## 2021-03-03 DIAGNOSIS — M6281 Muscle weakness (generalized): Secondary | ICD-10-CM | POA: Diagnosis not present

## 2021-03-03 DIAGNOSIS — M542 Cervicalgia: Secondary | ICD-10-CM | POA: Diagnosis not present

## 2021-03-04 ENCOUNTER — Other Ambulatory Visit: Payer: Self-pay | Admitting: Internal Medicine

## 2021-03-08 DIAGNOSIS — M542 Cervicalgia: Secondary | ICD-10-CM | POA: Diagnosis not present

## 2021-03-08 DIAGNOSIS — Z96653 Presence of artificial knee joint, bilateral: Secondary | ICD-10-CM | POA: Diagnosis not present

## 2021-03-08 DIAGNOSIS — G894 Chronic pain syndrome: Secondary | ICD-10-CM | POA: Diagnosis not present

## 2021-03-08 DIAGNOSIS — M6281 Muscle weakness (generalized): Secondary | ICD-10-CM | POA: Diagnosis not present

## 2021-03-11 DIAGNOSIS — G894 Chronic pain syndrome: Secondary | ICD-10-CM | POA: Diagnosis not present

## 2021-03-11 DIAGNOSIS — Z96653 Presence of artificial knee joint, bilateral: Secondary | ICD-10-CM | POA: Diagnosis not present

## 2021-03-11 DIAGNOSIS — M6281 Muscle weakness (generalized): Secondary | ICD-10-CM | POA: Diagnosis not present

## 2021-03-11 DIAGNOSIS — M542 Cervicalgia: Secondary | ICD-10-CM | POA: Diagnosis not present

## 2021-03-15 ENCOUNTER — Encounter: Payer: Self-pay | Admitting: Internal Medicine

## 2021-03-15 ENCOUNTER — Other Ambulatory Visit: Payer: Self-pay

## 2021-03-15 ENCOUNTER — Ambulatory Visit (INDEPENDENT_AMBULATORY_CARE_PROVIDER_SITE_OTHER): Payer: Medicare Other | Admitting: Internal Medicine

## 2021-03-15 DIAGNOSIS — I2510 Atherosclerotic heart disease of native coronary artery without angina pectoris: Secondary | ICD-10-CM

## 2021-03-15 DIAGNOSIS — F419 Anxiety disorder, unspecified: Secondary | ICD-10-CM | POA: Diagnosis not present

## 2021-03-15 DIAGNOSIS — M791 Myalgia, unspecified site: Secondary | ICD-10-CM

## 2021-03-15 DIAGNOSIS — G8929 Other chronic pain: Secondary | ICD-10-CM | POA: Diagnosis not present

## 2021-03-15 DIAGNOSIS — F332 Major depressive disorder, recurrent severe without psychotic features: Secondary | ICD-10-CM | POA: Diagnosis not present

## 2021-03-15 DIAGNOSIS — M5442 Lumbago with sciatica, left side: Secondary | ICD-10-CM | POA: Diagnosis not present

## 2021-03-15 DIAGNOSIS — M5441 Lumbago with sciatica, right side: Secondary | ICD-10-CM

## 2021-03-15 MED ORDER — FENTANYL 25 MCG/HR TD PT72: 1.0000 | MEDICATED_PATCH | TRANSDERMAL | 0 refills | Status: DC

## 2021-03-15 MED ORDER — OXYCODONE HCL 5 MG PO TABS: 10.0000 mg | ORAL_TABLET | Freq: Three times a day (TID) | ORAL | 0 refills | Status: DC | PRN

## 2021-03-15 MED ORDER — FENTANYL 25 MCG/HR TD PT72
1.0000 | MEDICATED_PATCH | TRANSDERMAL | 0 refills | Status: DC
Start: 2021-03-15 — End: 2021-06-28

## 2021-03-15 NOTE — Assessment & Plan Note (Signed)
Using a Fisher-Wallace device - it is helping

## 2021-03-15 NOTE — Progress Notes (Signed)
Subjective:  Patient ID: Lee Owens, male    DOB: 1950/11/12  Age: 70 y.o. MRN: [id number]  CC: Follow-up (3 month f/u)   HPI Lee Owens presents for chronic pain, spasms, depression f/u  Outpatient Medications Prior to Visit  Medication Sig Dispense Refill   allopurinol (ZYLOPRIM) 300 MG tablet TAKE 1 TABLET(300 MG) BY MOUTH DAILY 90 tablet 2   buPROPion (WELLBUTRIN XL) 150 MG 24 hr tablet TAKE 2 TABLETS(300 MG) BY MOUTH DAILY 180 tablet 1   Cholecalciferol (VITAMIN D-3) 5000 UNITS TABS Take 5,000 Units by mouth daily.     Coenzyme Q10 (COQ10) 400 MG CAPS 1 po qd 100 capsule 3   diazepam (VALIUM) 10 MG tablet TAKE 1/2 TO 1 TABLET(5 TO 10 MG) BY MOUTH EVERY 8 HOURS AS NEEDED FOR ANXIETY OR MUSCLE SPASMS 90 tablet 2   DULoxetine (CYMBALTA) 60 MG capsule Take 1 capsule (60 mg total) by mouth 2 (two) times daily. 60 capsule 5   fentaNYL (DURAGESIC) 25 MCG/HR Place 1 patch onto the skin every other day. 15 patch 0   fentaNYL (DURAGESIC) 25 MCG/HR Place 1 patch onto the skin every other day. 15 patch 0   fentaNYL (DURAGESIC) 25 MCG/HR Place 1 patch onto the skin every other day. 15 patch 0   finasteride (PROSCAR) 5 MG tablet Take 5 mg by mouth daily.     gabapentin (NEURONTIN) 300 MG capsule Take 1 capsule (300 mg total) by mouth 3 (three) times daily. 270 capsule 3   L-Methylfolate-Algae 15-90.314 MG CAPS TAKE 1 CAPSULE BY MOUTH ONCE DAILY 30 capsule 11   lovastatin (MEVACOR) 40 MG tablet TAKE 1 TABLET(40 MG) BY MOUTH AT BEDTIME 90 tablet 3   NARCAN 4 MG/0.1ML LIQD nasal spray kit as needed.  0   oxyCODONE (OXY IR/ROXICODONE) 5 MG immediate release tablet Take 2 tablets (10 mg total) by mouth every 8 (eight) hours as needed for severe pain (chronic back pain). 180 tablet 0   tamsulosin (FLOMAX) 0.4 MG CAPS capsule Take 1 capsule by mouth daily.  2   temazepam (RESTORIL) 30 MG capsule TAKE 1 TO 2 CAPSULES BY MOUTH AT BEDTIME AS NEEDED FOR SLEEP 180 capsule 0   vitamin B-12  (CYANOCOBALAMIN) 1000 MCG tablet Take 2,000 mcg by mouth daily.     oxyCODONE (OXY IR/ROXICODONE) 5 MG immediate release tablet Take 2 tablets (10 mg total) by mouth every 8 (eight) hours as needed for severe pain (chronic back pain). 180 tablet 0   oxyCODONE (OXY IR/ROXICODONE) 5 MG immediate release tablet Take 2 tablets (10 mg total) by mouth every 8 (eight) hours as needed for severe pain (chronic back pain). 180 tablet 0   No facility-administered medications prior to visit.    ROS: Review of Systems  Constitutional:  Negative for appetite change, fatigue and unexpected weight change.  HENT:  Negative for congestion, nosebleeds, sneezing, sore throat and trouble swallowing.   Eyes:  Negative for itching and visual disturbance.  Respiratory:  Negative for cough.   Cardiovascular:  Negative for chest pain, palpitations and leg swelling.  Gastrointestinal:  Negative for abdominal distention, blood in stool, diarrhea and nausea.  Genitourinary:  Negative for frequency and hematuria.  Musculoskeletal:  Positive for arthralgias and back pain. Negative for gait problem, joint swelling and neck pain.  Skin:  Negative for rash.  Neurological:  Negative for dizziness, tremors, speech difficulty and weakness.  Psychiatric/Behavioral:  Positive for dysphoric mood. Negative for agitation and sleep disturbance. The patient is  nervous/anxious.    Objective:  BP (!) 142/86 (BP Location: Left Arm)    Pulse 86    Temp 98.3 F (36.8 C) (Oral)    Ht 6' 1"  (1.854 m)    Wt 243 lb (110.2 kg)    SpO2 95%    BMI 32.06 kg/m   BP Readings from Last 3 Encounters:  03/15/21 (!) 142/86  12/14/20 (!) 142/86  09/16/20 (!) 130/92    Wt Readings from Last 3 Encounters:  03/15/21 243 lb (110.2 kg)  12/14/20 235 lb (106.6 kg)  09/16/20 234 lb 3.2 oz (106.2 kg)    Physical Exam Constitutional:      General: He is not in acute distress.    Appearance: He is well-developed.     Comments: NAD  Eyes:      Conjunctiva/sclera: Conjunctivae normal.     Pupils: Pupils are equal, round, and reactive to light.  Neck:     Thyroid: No thyromegaly.     Vascular: No JVD.  Cardiovascular:     Rate and Rhythm: Normal rate and regular rhythm.     Heart sounds: Normal heart sounds. No murmur heard.   No friction rub. No gallop.  Pulmonary:     Effort: Pulmonary effort is normal. No respiratory distress.     Breath sounds: Normal breath sounds. No wheezing or rales.  Chest:     Chest wall: No tenderness.  Abdominal:     General: Bowel sounds are normal. There is no distension.     Palpations: Abdomen is soft. There is no mass.     Tenderness: There is no abdominal tenderness. There is no guarding or rebound.  Musculoskeletal:        General: Tenderness present. Normal range of motion.     Cervical back: Normal range of motion.  Lymphadenopathy:     Cervical: No cervical adenopathy.  Skin:    General: Skin is warm and dry.     Findings: No rash.  Neurological:     Mental Status: He is alert and oriented to person, place, and time.     Cranial Nerves: No cranial nerve deficit.     Motor: No abnormal muscle tone.     Coordination: Coordination normal.     Gait: Gait abnormal.     Deep Tendon Reflexes: Reflexes are normal and symmetric.  Psychiatric:        Behavior: Behavior normal.        Thought Content: Thought content normal.        Judgment: Judgment normal.    Lab Results  Component Value Date   WBC 6.3 12/14/2020   HGB 14.8 12/14/2020   HCT 44.1 12/14/2020   PLT 205.0 12/14/2020   GLUCOSE 99 12/14/2020   CHOL 156 12/14/2020   TRIG 102.0 12/14/2020   HDL 52.00 12/14/2020   LDLCALC 83 12/14/2020   ALT 26 12/14/2020   AST 20 12/14/2020   NA 140 12/14/2020   K 4.4 12/14/2020   CL 103 12/14/2020   CREATININE 0.95 12/14/2020   BUN 9 12/14/2020   CO2 30 12/14/2020   TSH 1.00 12/14/2020   PSA 0.76 06/29/2016   INR 0.96 06/30/2013   HGBA1C 5.7 12/14/2020    DG Tibia/Fibula  Left  Result Date: 06/13/2020 CLINICAL DATA:  Chronic left leg pain which has worsened over the past week. Initial encounter. EXAM: LEFT TIBIA AND FIBULA - 2 VIEW COMPARISON:  Plain films left foot 10/01/2017. FINDINGS: There is no evidence of fracture  or other focal bone lesions. Left knee arthroplasty is normal in appearance. Plantar calcaneal spur is incidentally noted and unchanged. Soft tissues are unremarkable. IMPRESSION: Negative exam. Electronically Signed   By: Inge Rise M.D.   On: 06/13/2020 13:59    Assessment & Plan:   Problem List Items Addressed This Visit     Anxiety    Diazepam prn  Potential benefits of a long term benzodiazepines  use as well as potential risks  and complications were explained to the patient and were aknowledged.      Depression    Using a [employer] device - it is helping      Low back pain    Cont on Duragesic, Oxy IR  Potential benefits of a long term opioids use as well as potential risks (i.e. addiction risk, apnea etc) and complications (i.e. Somnolence, constipation and others) were explained to the patient and were aknowledged. Diazepam for muscle spasms.  Not to be taken together with opioids      Myalgia    Diazepam prn spasms  Potential benefits of a long term benzodiazepines  use as well as potential risks  and complications were explained to the patient and were aknowledged. Narcan prescription provided         No orders of the defined types were placed in this encounter.     Follow-up: No follow-ups on file.  Walker Kehr, MD

## 2021-03-15 NOTE — Assessment & Plan Note (Signed)
Cont on Duragesic, Oxy IR  Potential benefits of a long term opioids use as well as potential risks (i.e. addiction risk, apnea etc) and complications (i.e. Somnolence, constipation and others) were explained to the patient and were aknowledged. Diazepam for muscle spasms.  Not to be taken together with opioids

## 2021-03-15 NOTE — Assessment & Plan Note (Signed)
Diazepam prn  Potential benefits of a long term benzodiazepines  use as well as potential risks  and complications were explained to the patient and were aknowledged. 

## 2021-03-15 NOTE — Assessment & Plan Note (Signed)
Diazepam prn spasms  Potential benefits of a long term benzodiazepines  use as well as potential risks  and complications were explained to the patient and were aknowledged. Narcan prescription provided

## 2021-03-17 DIAGNOSIS — M542 Cervicalgia: Secondary | ICD-10-CM | POA: Diagnosis not present

## 2021-03-17 DIAGNOSIS — Z96653 Presence of artificial knee joint, bilateral: Secondary | ICD-10-CM | POA: Diagnosis not present

## 2021-03-17 DIAGNOSIS — G894 Chronic pain syndrome: Secondary | ICD-10-CM | POA: Diagnosis not present

## 2021-03-17 DIAGNOSIS — M6281 Muscle weakness (generalized): Secondary | ICD-10-CM | POA: Diagnosis not present

## 2021-03-22 ENCOUNTER — Other Ambulatory Visit: Payer: Self-pay | Admitting: Internal Medicine

## 2021-04-19 ENCOUNTER — Other Ambulatory Visit: Payer: Self-pay | Admitting: Internal Medicine

## 2021-04-22 ENCOUNTER — Other Ambulatory Visit: Payer: Self-pay | Admitting: Internal Medicine

## 2021-04-27 ENCOUNTER — Ambulatory Visit (HOSPITAL_COMMUNITY)
Admission: RE | Admit: 2021-04-27 | Discharge: 2021-04-27 | Disposition: A | Discharge status: Home / Self Care | Payer: Medicare Other | Source: Ambulatory Visit | Attending: Cardiology | Admitting: Cardiology

## 2021-04-27 ENCOUNTER — Other Ambulatory Visit: Payer: Self-pay

## 2021-04-27 DIAGNOSIS — I712 Thoracic aortic aneurysm, without rupture, unspecified: Secondary | ICD-10-CM | POA: Insufficient documentation

## 2021-04-27 DIAGNOSIS — I517 Cardiomegaly: Secondary | ICD-10-CM | POA: Diagnosis not present

## 2021-04-27 DIAGNOSIS — J9811 Atelectasis: Secondary | ICD-10-CM | POA: Diagnosis not present

## 2021-04-27 DIAGNOSIS — I7121 Aneurysm of the ascending aorta, without rupture: Secondary | ICD-10-CM | POA: Diagnosis not present

## 2021-04-27 DIAGNOSIS — I77810 Thoracic aortic ectasia: Secondary | ICD-10-CM | POA: Diagnosis not present

## 2021-04-27 LAB — POCT I-STAT CREATININE: Creatinine, Ser: 1.1 mg/dL (ref 0.61–1.24)

## 2021-04-27 MED ORDER — SODIUM CHLORIDE (PF) 0.9 % IJ SOLN
INTRAMUSCULAR | Status: AC
  Filled 2021-04-27: qty 50

## 2021-04-27 MED ORDER — IOHEXOL 350 MG/ML SOLN
100.0000 mL | Freq: Once | INTRAVENOUS | Status: AC | PRN
  Administered 2021-04-27: 100 mL via INTRAVENOUS

## 2021-04-29 NOTE — Progress Notes (Signed)
Cardiology Office Note:    Date:  05/02/2021   ID:  Lee Owens, DOB [birth year], MRN [id number]  PCP:  Cassandria Anger, MD  Cardiologist:  Buford Dresser, MD PhD  Referring MD: Cassandria Anger, MD   CC: follow up  History of Present Illness:    Lee Owens is a 71 y.o. male with a hx of hypertension who was initially seen via telemedicine visit 06/25/18. He was referred after calcium score was done by Dr. Alain Marion.   Cardiac history: he was started on rosuvastatin 10 mg in the past. He was changed back to lovastatin at his visit with Dr. Alain Marion 09/26/18. He states muscle pain was severe on rosuvastatin, returned to lovastatin. Now tolerating again without issue. Reviewed history, he is unsure of all of the statins he has tried (documentation of simvastatin, unsure about atorvastatin).  Has history of high blood pressure, was taken off BP meds due to lightheadedness.    Brother had heart attack in his early 39s. Reportedly has poor health habits.  Today: He is accompanied by his wife. Overall, he appears well. However, he continues to suffer from sciatic nerve pain and chronic back pain.  Due to his chronic pain he has not been able to exercise formally. He is ambulating with assistance from two canes, with no recent falls.  His blood pressure is elevated in clinic today at 148/98. The systolic reading is typical for him at other clinic visits, but he notes his diastolic pressure is not usually this high. He is not checking his blood pressure at home.  He is concerned regarding his latest creatinine level, which was 1.10 on 04/27/21.  They endorse bilateral LE edema L>R with an area on his left shin with small spider veins.  For the past 3-4 months he has noticed that Cymbalta does not seem to be working to help his depression. However, he is having difficulty stopping Cymbalta due to withdrawal symptoms.  He denies any palpitations, chest pain, or shortness of  breath. No lightheadedness, headaches, syncope, orthopnea, or PND.   Past Medical History:  Diagnosis Date   Allergic rhinitis    Anxiety    takes Valium bid   Arthritis    "hips, knees, hands" (07/07/2013)   Back pain    occasionally but reason unknown   BPH (benign prostatic hypertrophy)    takes Proscar daily   Chronic fatigue    Complication of anesthesia    Per pt, "Hard to sedate" past last colonoscopy in 2008!   Depression    takes Cymbalta daily   Diverticulosis    Dizziness    due to dehydration   Hemorrhoids    Hyperlipidemia    takes Lovastatin daily   Hypertension    Insomnia    takes Restoril nightly    Joint pain    knees/ankles/back pain   Joint swelling    Muscle pain    takes Baclofen daily   Night muscle spasms    takes Valium as needed   Pneumonia 1971   Right knee DJD    Urinary frequency    takes rapaflo daily   Vitamin B12 deficiency 2009   Vitamin D deficiency disease     Past Surgical History:  Procedure Laterality Date   COLONOSCOPY     JOINT REPLACEMENT     both knees   KNEE ARTHROSCOPY Bilateral 2012   PROSTATE BIOPSY  01/2010   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   Procedure: TOTAL KNEE ARTHROPLASTY- left;  Surgeon: Lorn Junes, MD;  Location: [facility];  Service: Orthopedics;  Laterality: Left;   TOTAL KNEE ARTHROPLASTY Right 07/07/2013   TOTAL KNEE ARTHROPLASTY Right 07/07/2013   Procedure: TOTAL KNEE ARTHROPLASTY- right;  Surgeon: Lorn Junes, MD;  Location: [facility];  Service: Orthopedics;  Laterality: Right;    Current Medications: Current Outpatient Medications on File Prior to Visit  Medication Sig   allopurinol (ZYLOPRIM) 300 MG tablet TAKE 1 TABLET(300 MG) BY MOUTH DAILY   buPROPion (WELLBUTRIN XL) 150 MG 24 hr tablet TAKE 2 TABLETS(300 MG) BY MOUTH DAILY   Cholecalciferol (VITAMIN D-3) 5000 UNITS TABS Take 5,000 Units by mouth daily.   Coenzyme Q10 (COQ10) 400 MG CAPS 1 po qd   diazepam (VALIUM) 10 MG tablet TAKE  1/2 TO 1 TABLET(5 TO 10 MG) BY MOUTH EVERY 8 HOURS AS NEEDED FOR ANXIETY OR MUSCLE SPASMS   DULoxetine (CYMBALTA) 60 MG capsule Take 1 capsule (60 mg total) by mouth 2 (two) times daily.   fentaNYL (DURAGESIC) 25 MCG/HR Place 1 patch onto the skin every other day.   fentaNYL (DURAGESIC) 25 MCG/HR Place 1 patch onto the skin every other day.   fentaNYL (DURAGESIC) 25 MCG/HR Place 1 patch onto the skin every other day.   finasteride (PROSCAR) 5 MG tablet Take 5 mg by mouth daily.   gabapentin (NEURONTIN) 300 MG capsule Take 1 capsule (300 mg total) by mouth 3 (three) times daily.   L-Methylfolate-Algae 15-90.314 MG CAPS TAKE 1 CAPSULE BY MOUTH ONCE DAILY   lovastatin (MEVACOR) 40 MG tablet TAKE 1 TABLET(40 MG) BY MOUTH AT BEDTIME   NARCAN 4 MG/0.1ML LIQD nasal spray kit as needed.   oxyCODONE (OXY IR/ROXICODONE) 5 MG immediate release tablet Take 2 tablets (10 mg total) by mouth every 8 (eight) hours as needed for severe pain (chronic back pain).   oxyCODONE (OXY IR/ROXICODONE) 5 MG immediate release tablet Take 2 tablets (10 mg total) by mouth every 8 (eight) hours as needed for severe pain (chronic back pain).   oxyCODONE (OXY IR/ROXICODONE) 5 MG immediate release tablet Take 2 tablets (10 mg total) by mouth every 8 (eight) hours as needed for severe pain (chronic back pain).   tamsulosin (FLOMAX) 0.4 MG CAPS capsule Take 1 capsule by mouth daily.   temazepam (RESTORIL) 30 MG capsule TAKE 1 TO 2 CAPSULES BY MOUTH AT BEDTIME AS NEEDED FOR SLEEP   vitamin B-12 (CYANOCOBALAMIN) 1000 MCG tablet Take 2,000 mcg by mouth daily.   No current facility-administered medications on file prior to visit.     Allergies:   Crestor [rosuvastatin calcium]   Social History   Tobacco Use   Smoking status: Former    Packs/day: 1.00    Years: 38.00    Pack years: 38.00    Types: Cigarettes    Quit date: 10/23/2005    Years since quitting: 15.5   Smokeless tobacco: Never   Tobacco comments:    quit smoking  2008  Substance Use Topics   Alcohol use: Yes    Alcohol/week: 14.0 standard drinks    Types: 14 Cans of beer per week    Comment: 03/09/17 "2 beers daily"   Drug use: No    Comment: quit age 5    Family History: The patient's family history includes Atrial fibrillation in his father; Coronary artery disease in his father and another family member; Dementia in his sister; Heart failure in his father; Hypertension in an other family member; Kidney disease in his father;  Liver cancer in his mother.  ROS:   Please see the history of present illness.   (+) Chronic back pain (+) L>R LE edema (+) Depression (+) Anxiety Additional pertinent ROS negative except as documented  EKGs/Labs/Other Studies Reviewed:    The following studies were reviewed today:  CTA Chest/Aorta 04/27/2021: COMPARISON:  05/19/2020; 04/30/2019; coronary artery calcium scoring-05/21/2018   FINDINGS: Vascular Findings:   Stable fusiform aneurysmal dilatation of the ascending thoracic aorta with measurements as follows.   The thoracic aorta tapers to a normal caliber at the level of the aortic arch. There is a minimal amount of calcified atherosclerotic plaque involving the aortic arch, not resulting in a hemodynamically significant stenosis. The descending thoracic aorta is of normal caliber and widely patent without hemodynamically significant narrowing. No evidence of thoracic aortic dissection or perivascular stranding on this nongated examination   Conventional configuration of the aortic arch. The branch vessels of the aortic arch appear patent throughout their imaged courses.   Borderline cardiomegaly. Trace amount of pericardial fluid, presumably physiologic. Coronary artery calcifications.   Although this examination was not tailored for the evaluation the pulmonary arteries, there are no discrete filling defects within the central pulmonary arterial tree to suggest central pulmonary embolism.  Normal caliber of the main pulmonary artery.   There is narrowing of the left subclavian vein at the level of the thoracic inlet with associated mildly hypertrophied left chest wall and supraclavicular venous collaterals, potentially accentuated due to positioning of the patient's arms above his head, similar to the 04/2020 examination, of uncertain though doubtful clinical significance.   -------------------------------------------------------------   Thoracic aortic measurements:   SINOTUBULAR JUNCTION: 36 mm as measured in greatest oblique short axis coronal dimension.   PROXIMAL ASCENDING THORACIC AORTA: 43 mm as measured in greatest oblique short axis axial dimension at the level of the main pulmonary artery (axial image 45, series 8) and approximately 43 mm in greatest oblique short axis coronal diameter (coronal image 75, series 10), unchanged compared to the 04/2018 examination   AORTIC ARCH: 28 mm as measured in greatest oblique short axis sagittal dimension.   PROXIMAL DESCENDING THORACIC AORTA: 32 mm as measured in greatest oblique short axis axial dimension at the level of the main pulmonary artery.   DISTAL DESCENDING THORACIC AORTA: 27 mm as measured in greatest oblique short axis axial dimension at the level of the diaphragmatic hiatus.   Review of the MIP images confirms the above findings.   -------------------------------------------------------------   Non-Vascular Findings:.   Mediastinum/Lymph Nodes: No bulky mediastinal, hilar or axillary lymphadenopathy.   Lungs/Pleura: Minimal dependent subpleural ground-glass atelectasis. Minimal subsegmental atelectasis involving the right lower lobe. No discrete focal airspace opacities. No pleural effusion or pneumothorax. No discrete pulmonary nodules.   Upper abdomen: Limited early arterial phase evaluation of the upper abdomen demonstrates diffuse decreased attenuation of the hepatic parenchyma  suggestive of hepatic steatosis   Musculoskeletal: No acute or aggressive osseous abnormalities. 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 Rather significant though grossly symmetric bilateral gynecomastia, progressed compared to the 04/2018 examination. Normal appearance of the thyroid gland.   IMPRESSION: 1. Stable mild fusiform aneurysmal dilatation of the ascending thoracic aorta measuring 43 mm in diameter, unchanged compared to the 04/2018 examination. Recommend annual imaging followup by CTA or MRA. This recommendation follows 2010 ACCF/AHA/AATS/ACR/ASA/SCA/SCAI/SIR/STS/SVM Guidelines for the Diagnosis and Management of Patients with Thoracic Aortic Disease. Circulation. 2010; 121: Q734-L937. Aortic aneurysm NOS (ICD10-I71.9) 2. Coronary calcifications.  Aortic Atherosclerosis (  ICD10-I70.0).  Calcium Score 05/21/2018: FINDINGS: Non-cardiac: No significant non cardiac findings on limited lung and soft tissue windows. See separate report from [facility].   Ascending Aorta: Moderately dilated aortic root 4.3 cm   Pericardium: Normal   Coronary arteries: Calcium noted in LM and LAD   IMPRESSION: Coronary calcium score of 142. This was 12 th percentile for age and sex matched control   Moderately dilated aortic root 4.3 cm consider f/u CTA to further evaluate .  CT angio 04/30/19: Cardiovascular: The ascending thoracic aorta is aneurysmal measuring up to approximately 4.1 cm in diameter (axial series 4, image 66). There is no evidence for thoracic aortic dissection or aneurysm. Minimal atherosclerotic changes are noted of the thoracic aorta. Mild coronary artery calcifications are noted. The heart size is normal. No acute pulmonary embolism was detected on this exam. There is no significant pericardial effusion.    EKG:  EKG is personally reviewed.   05/02/2021: NSR at 78 bpm with nonspecific ST/T pattern 05/05/2020: NSR at 76 bpm with  nonspecific ST/T pattern  Recent Labs: 12/14/2020: ALT 26; BUN 9; Hemoglobin 14.8; Platelets 205.0; Potassium 4.4; Sodium 140; TSH 1.00 04/27/2021: Creatinine, Ser 1.10   Recent Lipid Panel    Component Value Date/Time   CHOL 156 12/14/2020 1430   CHOL 165 05/05/2020 1651   TRIG 102.0 12/14/2020 1430   HDL 52.00 12/14/2020 1430   HDL 54 05/05/2020 1651   CHOLHDL 3 12/14/2020 1430   VLDL 20.4 12/14/2020 1430   LDLCALC 83 12/14/2020 1430   LDLCALC 95 05/05/2020 1651    Physical Exam:    VS:  BP (!) 148/98    Pulse 78    Ht _0  (1.854 m)    Wt 243 lb 1.6 oz (110.3 kg)    SpO2 95%    BMI 32.07 kg/m     Wt Readings from Last 3 Encounters:  05/02/21 243 lb 1.6 oz (110.3 kg)  03/15/21 243 lb (110.2 kg)  12/14/20 235 lb (106.6 kg)    GEN: Well nourished, well developed in no acute distress HEENT: Normal, moist mucous membranes NECK: No JVD CARDIAC: regular rhythm, normal S1 and S2, no rubs or gallops. No murmur. VASCULAR: Radial and DP pulses 2+ bilaterally. No carotid bruits RESPIRATORY:  Clear to auscultation without rales, wheezing or rhonchi  ABDOMEN: Soft, non-tender, non-distended MUSCULOSKELETAL:  Ambulates independently SKIN: Warm and dry, concerned for L>R LE edema but no significant edema on exam, spider veins noted on left shin NEUROLOGIC:  Alert and oriented x 3. No focal neuro deficits noted. PSYCHIATRIC:  Normal affect   ASSESSMENT:    1. Aneurysm of ascending aorta without rupture   2. Essential hypertension   3. Coronary artery calcification seen on CT scan   4. Pure hypercholesterolemia   5. Medication management     PLAN:    Coronary calcium seen on CT imaging: calcium score 129/55th percentile -consistent with nonobstructive CAD, asymptomatic -already on aspirin 81 mg daily, tolerating -did not tolerate rosuvastatin, simvastatin. Tolerating lovastatin and CoQ10 -discussed CV risk management, below   Thoracic aortic ectasia/aneurysm -CT angio  stable based on imaging above -discussed importance of good BP control, see below  Hypertension:  -reviewed goal of <130/80 given TAA -we reviewed pros/cons of treatment options: -beta blocker: struggling with depression. Will hold on this for now. Did discuss it is recommended given TAA -ARB: was on losartan 100 mg in the past, got lightheaded when he stood up. He is willing to  try this at a lower dose, will start 25 mg today -amlodipine: has chronic LE edema but is willing to try if needed  Hyperlipidemia: LDL goal <70 -LDL 83 on most recent lipids, not at goal but has not tolerated other statins -continue lovastatin and CoQ10  Cardiac risk counseling and prevention recommendations: We focused a lot on lifestyle today. His LDL is not at goal, but close. He would like to work on diet and exercise first before adjusting medications further. -recommend heart healthy/Mediterranean diet, with whole grains, fruits, vegetable, fish, lean meats, nuts, and olive oil. Limit salt. -recommend moderate walking, 3-5 times/week for 30-50 minutes each session. Aim for at least 150 minutes.week. Goal should be pace of 3 miles/hours, or walking 1.5 miles in 30 minutes -recommend avoidance of tobacco products. Avoid excess alcohol.  Plan for follow up: 3 months or sooner PRN  Medication Adjustments/Labs and Tests Ordered: Current medicines are reviewed at length with the patient today.  Concerns regarding medicines are outlined above.   Orders Placed This Encounter  Procedures   Basic metabolic panel   EKG 76-EGBT   Meds ordered this encounter  Medications   losartan (COZAAR) 25 MG tablet    Sig: Take 1 tablet (25 mg total) by mouth daily.    Dispense:  90 tablet    Refill:  3   Patient Instructions  Medication Instructions:  1) START: Losartan 25 mg daily   *If you need a refill on your cardiac medications before your next appointment, please call your pharmacy*   Lab Work: Your provider  has recommended lab work in 3 week (BMP). Please have this collected at [facility] [place]. The lab is open 8:00 am - 4:30 pm. Please avoid 12:00p - 1:00p for lunch hour. You do not need an appointment. Please go to [address] [place], [place] [address]. This is in the Primary Care office on the 3rd floor, let them know you are there for blood work and they will direct you to the lab.  If you have labs (blood work) drawn today and your tests are completely normal, you will receive your results only by: [place] (if you have MyChart) OR A paper copy in the mail If you have any lab test that is abnormal or we need to change your treatment, we will call you to review the results.   Testing/Procedures: None ordered today   Follow-Up: At [facility], you and your health needs are our priority.  As part of our continuing mission to provide you with exceptional heart care, we have created designated Provider Care Teams.  These Care Teams include your primary Cardiologist (physician) and Advanced Practice Providers (APPs -  Physician Assistants and Nurse Practitioners) who all work together to provide you with the care you need, when you need it.  We recommend signing up for the patient portal called "MyChart".  Sign up information is provided on this After Visit Summary.  MyChart is used to connect with patients for Virtual Visits (Telemedicine).  Patients are able to view lab/test results, encounter notes, upcoming appointments, etc.  Non-urgent messages can be sent to your provider as well.   To learn more about what you can do with MyChart, go to [email or web address].    Your next appointment:   3 month(s)  The format for your next appointment:   In Person  Provider:   Buford Dresser, MD       [facility],acting as a scribe for  Buford Dresser, MD.,have documented all relevant documentation on the behalf of Buford Dresser, MD,as directed by  Buford Dresser, MD while in the presence of Buford Dresser, MD.  I, Buford Dresser, MD, have reviewed all documentation for this visit. The documentation on 05/02/21 for the exam, diagnosis, procedures, and orders are all accurate and complete.   Signed, Buford Dresser, MD PhD 05/02/2021  [facility]

## 2021-05-02 ENCOUNTER — Encounter (HOSPITAL_BASED_OUTPATIENT_CLINIC_OR_DEPARTMENT_OTHER): Payer: Self-pay | Admitting: Cardiology

## 2021-05-02 ENCOUNTER — Other Ambulatory Visit: Payer: Self-pay

## 2021-05-02 ENCOUNTER — Ambulatory Visit (INDEPENDENT_AMBULATORY_CARE_PROVIDER_SITE_OTHER): Payer: Medicare Other | Admitting: Cardiology

## 2021-05-02 VITALS — BP 148/98 | HR 78 | Ht 73.0 in | Wt 243.1 lb

## 2021-05-02 DIAGNOSIS — I7121 Aneurysm of the ascending aorta, without rupture: Secondary | ICD-10-CM | POA: Diagnosis not present

## 2021-05-02 DIAGNOSIS — E7800 Pure hypercholesterolemia, unspecified: Secondary | ICD-10-CM | POA: Diagnosis not present

## 2021-05-02 DIAGNOSIS — I10 Essential (primary) hypertension: Secondary | ICD-10-CM | POA: Diagnosis not present

## 2021-05-02 DIAGNOSIS — I2510 Atherosclerotic heart disease of native coronary artery without angina pectoris: Secondary | ICD-10-CM | POA: Diagnosis not present

## 2021-05-02 DIAGNOSIS — Z79899 Other long term (current) drug therapy: Secondary | ICD-10-CM | POA: Diagnosis not present

## 2021-05-02 MED ORDER — LOSARTAN POTASSIUM 25 MG PO TABS: 25.0000 mg | ORAL_TABLET | Freq: Every day | ORAL | 3 refills | Status: DC

## 2021-05-02 NOTE — Patient Instructions (Signed)
Medication Instructions:  1) START: Losartan 25 mg daily   *If you need a refill on your cardiac medications before your next appointment, please call your pharmacy*   Lab Work: Your provider has recommended lab work in 3 week (BMP). Please have this collected at [facility] [place]. The lab is open 8:00 am - 4:30 pm. Please avoid 12:00p - 1:00p for lunch hour. You do not need an appointment. Please go to [address] [place], [place] [address]. This is in the Primary Care office on the 3rd floor, let them know you are there for blood work and they will direct you to the lab.  If you have labs (blood work) drawn today and your tests are completely normal, you will receive your results only by: [place] (if you have MyChart) OR A paper copy in the mail If you have any lab test that is abnormal or we need to change your treatment, we will call you to review the results.   Testing/Procedures: None ordered today   Follow-Up: At [facility], you and your health needs are our priority.  As part of our continuing mission to provide you with exceptional heart care, we have created designated Provider Care Teams.  These Care Teams include your primary Cardiologist (physician) and Advanced Practice Providers (APPs -  Physician Assistants and Nurse Practitioners) who all work together to provide you with the care you need, when you need it.  We recommend signing up for the patient portal called "MyChart".  Sign up information is provided on this After Visit Summary.  MyChart is used to connect with patients for Virtual Visits (Telemedicine).  Patients are able to view lab/test results, encounter notes, upcoming appointments, etc.  Non-urgent messages can be sent to your provider as well.   To learn more about what you can do with MyChart, go to [email or web address].    Your next appointment:   3 month(s)  The format for your next appointment:   In  Person  Provider:   Buford Dresser, MD

## 2021-05-09 ENCOUNTER — Telehealth (HOSPITAL_BASED_OUTPATIENT_CLINIC_OR_DEPARTMENT_OTHER): Payer: Self-pay | Admitting: Cardiology

## 2021-05-09 NOTE — Telephone Encounter (Signed)
Pt c/o medication issue:  1. Name of Medication: Losartan  2. How are you currently taking this medication (dosage and times per day)? 1 time a day  3. Are you having a reaction (difficulty breathing--STAT)?   4. What is your medication issue? Dizziness and lowering his blood pressure too much

## 2021-05-09 NOTE — Telephone Encounter (Signed)
2nd call attempt, no answer, left message for patient to call back!

## 2021-05-09 NOTE — Telephone Encounter (Signed)
Left message for patient to call back  

## 2021-05-11 NOTE — Telephone Encounter (Signed)
3rd call attempt no answer

## 2021-05-12 ENCOUNTER — Emergency Department (HOSPITAL_COMMUNITY): Payer: Medicare Other

## 2021-05-12 ENCOUNTER — Telehealth (HOSPITAL_BASED_OUTPATIENT_CLINIC_OR_DEPARTMENT_OTHER): Payer: Self-pay

## 2021-05-12 ENCOUNTER — Other Ambulatory Visit: Payer: Self-pay

## 2021-05-12 ENCOUNTER — Encounter (HOSPITAL_COMMUNITY): Payer: Self-pay | Admitting: Emergency Medicine

## 2021-05-12 ENCOUNTER — Emergency Department (HOSPITAL_COMMUNITY)
Admission: EM | Admit: 2021-05-12 | Discharge: 2021-05-13 | Disposition: A | Discharge status: Home / Self Care | Payer: Medicare Other | Attending: Emergency Medicine | Admitting: Emergency Medicine

## 2021-05-12 DIAGNOSIS — J90 Pleural effusion, not elsewhere classified: Secondary | ICD-10-CM | POA: Diagnosis not present

## 2021-05-12 DIAGNOSIS — R61 Generalized hyperhidrosis: Secondary | ICD-10-CM | POA: Diagnosis not present

## 2021-05-12 DIAGNOSIS — I10 Essential (primary) hypertension: Secondary | ICD-10-CM | POA: Diagnosis not present

## 2021-05-12 DIAGNOSIS — R231 Pallor: Secondary | ICD-10-CM | POA: Diagnosis not present

## 2021-05-12 DIAGNOSIS — I959 Hypotension, unspecified: Secondary | ICD-10-CM | POA: Diagnosis not present

## 2021-05-12 DIAGNOSIS — R55 Syncope and collapse: Secondary | ICD-10-CM | POA: Insufficient documentation

## 2021-05-12 LAB — COMPREHENSIVE METABOLIC PANEL
ALT: 24 U/L (ref 0–44)
AST: 19 U/L (ref 15–41)
Albumin: 3.9 g/dL (ref 3.5–5.0)
Alkaline Phosphatase: 57 U/L (ref 38–126)
Anion gap: 4 — ABNORMAL LOW (ref 5–15)
BUN: 11 mg/dL (ref 8–23)
CO2: 30 mmol/L (ref 22–32)
Calcium: 9.1 mg/dL (ref 8.9–10.3)
Chloride: 104 mmol/L (ref 98–111)
Creatinine, Ser: 1.19 mg/dL (ref 0.61–1.24)
GFR, Estimated: >60 mL/min (ref >60)
Glucose, Bld: 95 mg/dL (ref 70–99)
Potassium: 4.1 mmol/L (ref 3.5–5.1)
Sodium: 138 mmol/L (ref 135–145)
Total Bilirubin: 0.5 mg/dL (ref 0.3–1.2)
Total Protein: 6.5 g/dL (ref 6.5–8.1)

## 2021-05-12 LAB — CBC WITH DIFFERENTIAL/PLATELET
Abs Immature Granulocytes: 0.02 10*3/uL (ref 0.00–0.07)
Basophils Absolute: 0 10*3/uL (ref 0.0–0.1)
Basophils Relative: 1 %
Eosinophils Absolute: 0.4 10*3/uL (ref 0.0–0.5)
Eosinophils Relative: 7 %
HCT: 41.4 % (ref 39.0–52.0)
Hemoglobin: 14.1 g/dL (ref 13.0–17.0)
Immature Granulocytes: 0 %
Lymphocytes Relative: 28 %
Lymphs Abs: 1.6 10*3/uL (ref 0.7–4.0)
MCH: 32.6 pg (ref 26.0–34.0)
MCHC: 34.1 g/dL (ref 30.0–36.0)
MCV: 95.6 fL (ref 80.0–100.0)
Monocytes Absolute: 0.5 10*3/uL (ref 0.1–1.0)
Monocytes Relative: 9 %
Neutro Abs: 3.3 10*3/uL (ref 1.7–7.7)
Neutrophils Relative %: 55 %
Platelets: 199 10*3/uL (ref 150–400)
RBC: 4.33 MIL/uL (ref 4.22–5.81)
RDW: 13.5 % (ref 11.5–15.5)
WBC: 5.9 10*3/uL (ref 4.0–10.5)
nRBC: 0 % (ref 0.0–0.2)

## 2021-05-12 LAB — TROPONIN I (HIGH SENSITIVITY): Troponin I (High Sensitivity): 3 ng/L (ref <18)

## 2021-05-12 LAB — CBG MONITORING, ED: Glucose-Capillary: 82 mg/dL (ref 70–99)

## 2021-05-12 MED ORDER — LOSARTAN POTASSIUM 25 MG PO TABS: 12.5000 mg | ORAL_TABLET | Freq: Every day | ORAL | 3 refills | Status: DC

## 2021-05-12 NOTE — Telephone Encounter (Signed)
Yes, patient can take 1/2 tablet once a day.  Recommend he continue to self monitor BP

## 2021-05-12 NOTE — Addendum Note (Signed)
Addended by: Gerald Stabs on: 05/12/2021 04:30 PM   Modules accepted: Orders

## 2021-05-12 NOTE — Telephone Encounter (Signed)
Called to update patient to update with Pharmacy recommendations. Left VM okay per DPR!     "Yes, patient can take 1/2 tablet once a day.  Recommend he continue to self monitor BP"

## 2021-05-12 NOTE — ED Provider Triage Note (Addendum)
Emergency Medicine Provider Triage Evaluation Note  Lee Owens , a 71 y.o. male  was evaluated in triage.  Pt complains of near syncope.  He states that he felt like it was a panic attack and wasn't getting panicky.    Reportedly his blood pressure was in the 60s today.  His wife (who is a Marine scientist) checked it today while he was feeling bad and it was in the 60s.   He has been having blood pressure changes recently.   He had been not taking the losartan for a few days and today was the first day he had been retaking it.   He denies any pain.  No chest or back pain.     Physical Exam  SpO2 98%  Gen:   Awake, no distress   Resp:  Normal effort  MSK:   Moves extremities without difficulty  Other:  Normal speech.  He is awake and alert. Answers questions appropriately.   Medical Decision Making  Medically screening exam initiated at 8:19 PM.  Appropriate orders placed.  [name] was informed that the remainder of the evaluation will be completed by another provider, this initial triage assessment does not replace that evaluation, and the importance of remaining in the ED until their evaluation is complete.     Lorin Glass, PA-C 05/12/21 2028    Lorin Glass, [place] 05/12/21 2032

## 2021-05-12 NOTE — ED Triage Notes (Signed)
Patient was sitting in computer and started to talk crazy and being pale and was talking crazy for about 2 min per wife. Patient blood pressure meds changed. EKG-SR. 528ml NS.

## 2021-05-12 NOTE — Telephone Encounter (Signed)
Was seen one week ago and was started on Losartan   2 days before original call 106/57 and dizzy  Held Monday am, took bp at 4pm 164/0102  Continued taking but still dropping BP too low and making    Wife would like to know if they could reduce pill by half   Will route to PharmD pool for recommendations!     [phone] Lee Owens's cell- she would like a call back at this number!

## 2021-05-13 DIAGNOSIS — R55 Syncope and collapse: Secondary | ICD-10-CM | POA: Diagnosis not present

## 2021-05-13 LAB — TROPONIN I (HIGH SENSITIVITY): Troponin I (High Sensitivity): 4 ng/L (ref <18)

## 2021-05-13 NOTE — Discharge Instructions (Signed)
Continue to take half a tablet of your losartan daily.

## 2021-05-13 NOTE — ED Provider Notes (Signed)
[facility] DEPT Provider Note   CSN: [id number] Arrival date & time: 05/12/21  2008     History  Chief Complaint  Patient presents with   Near Syncope    Konor Noren is a 71 y.o. male.  The history is provided by the patient and the spouse.  Near Syncope Kruze Atchley is a 71 y.o. male who presents to the Emergency Department complaining of near syncopal episode.  He presents to the emergency department accompanied by his wife from EMS for evaluation following a near syncopal episode.  He has a history of hypertension, thoracic aortic aneurysm, hyperlipidemia, chronic pain.  He saw cardiology a week and a half ago and was started on losartan 25 mg.  He took this for several days of the medication and then started to feel dizzy.  He withheld the medication on Monday.  He reach back out to the cardiologist but did not hear back and noticed that his blood pressure was elevated Monday night so he resumed the medication on Tuesday, Wednesday, Thursday of this past week.  On Thursday evening he was sitting on the couch and felt unwell.  He states that he felt not right and felt like he might be having a panic attack but this was different.  He became diaphoretic with unintelligible speech and his wife checked his blood pressure and it was between 70s and 60s over 40s.  The episode lasted close to 30 minutes.  EMS was called.  At the time of ED arrival patient has been symptom-free.  No associated headache, chest pain, abdominal pain, shortness of breath, nausea, vomiting, numbness, weakness.  He has bilateral lower extremity edema, left greater than right and this is a chronic and ongoing issue.  He has been worked up previously for blood clots related to the swelling.     Home Medications Prior to Admission medications   Medication Sig Start Date End Date Taking? Authorizing Provider  allopurinol (ZYLOPRIM) 300 MG tablet TAKE 1 TABLET(300 MG) BY MOUTH DAILY  03/04/21   Plotnikov, Evie Lacks, MD  buPROPion (WELLBUTRIN XL) 150 MG 24 hr tablet TAKE 2 TABLETS(300 MG) BY MOUTH DAILY 04/19/21   Plotnikov, Evie Lacks, MD  Cholecalciferol (VITAMIN D-3) 5000 UNITS TABS Take 5,000 Units by mouth daily.    [provider]  Coenzyme Q10 (COQ10) 400 MG CAPS 1 po qd 03/27/19   Plotnikov, Evie Lacks, MD  diazepam (VALIUM) 10 MG tablet TAKE 1/2 TO 1 TABLET(5 TO 10 MG) BY MOUTH EVERY 8 HOURS AS NEEDED FOR ANXIETY OR MUSCLE SPASMS 04/26/21   Plotnikov, Evie Lacks, MD  DULoxetine (CYMBALTA) 60 MG capsule Take 1 capsule (60 mg total) by mouth 2 (two) times daily. 06/28/20   Plotnikov, Evie Lacks, MD  fentaNYL (DURAGESIC) 25 MCG/HR Place 1 patch onto the skin every other day. 03/15/21   Plotnikov, Evie Lacks, MD  fentaNYL (DURAGESIC) 25 MCG/HR Place 1 patch onto the skin every other day. 03/15/21   Plotnikov, Evie Lacks, MD  fentaNYL (DURAGESIC) 25 MCG/HR Place 1 patch onto the skin every other day. 03/15/21   Plotnikov, Evie Lacks, MD  finasteride (PROSCAR) 5 MG tablet Take 5 mg by mouth daily.    [provider]  gabapentin (NEURONTIN) 300 MG capsule Take 1 capsule (300 mg total) by mouth 3 (three) times daily. 06/17/20   Plotnikov, Evie Lacks, MD  L-Methylfolate-Algae [phone] MG CAPS TAKE 1 CAPSULE BY MOUTH ONCE DAILY 06/19/19   Plotnikov, Evie Lacks, MD  losartan (  COZAAR) 25 MG tablet Take 0.5 tablets (12.5 mg total) by mouth daily. 05/12/21 05/07/22  Pavero, Harrell Gave, RPH  lovastatin (MEVACOR) 40 MG tablet TAKE 1 TABLET(40 MG) BY MOUTH AT BEDTIME 03/22/21   Plotnikov, Evie Lacks, MD  NARCAN 4 MG/0.1ML LIQD nasal spray kit as needed. 03/05/17   [provider]  oxyCODONE (OXY IR/ROXICODONE) 5 MG immediate release tablet Take 2 tablets (10 mg total) by mouth every 8 (eight) hours as needed for severe pain (chronic back pain). 03/15/21   Plotnikov, Evie Lacks, MD  oxyCODONE (OXY IR/ROXICODONE) 5 MG immediate release tablet Take 2 tablets (10 mg total) by mouth  every 8 (eight) hours as needed for severe pain (chronic back pain). 03/15/21   Plotnikov, Evie Lacks, MD  oxyCODONE (OXY IR/ROXICODONE) 5 MG immediate release tablet Take 2 tablets (10 mg total) by mouth every 8 (eight) hours as needed for severe pain (chronic back pain). 03/15/21   Plotnikov, Evie Lacks, MD  tamsulosin (FLOMAX) 0.4 MG CAPS capsule Take 1 capsule by mouth daily. 10/29/15   [provider]  temazepam (RESTORIL) 30 MG capsule TAKE 1 TO 2 CAPSULES BY MOUTH AT BEDTIME AS NEEDED FOR SLEEP 03/07/21   Plotnikov, Evie Lacks, MD  vitamin B-12 (CYANOCOBALAMIN) 1000 MCG tablet Take 2,000 mcg by mouth daily.    [provider]      Allergies    Crestor [rosuvastatin calcium]    Review of Systems   Review of Systems  Cardiovascular:  Positive for near-syncope.  All other systems reviewed and are negative.  Physical Exam Updated Vital Signs BP (!) 150/98    Pulse 65    Temp 98.3 F (36.8 C) (Oral)    Resp 17    Ht 6' 1"  (1.854 m)    Wt 108.9 kg    SpO2 96%    BMI 31.66 kg/m  Physical Exam Vitals and nursing note reviewed.  Constitutional:      Appearance: He is well-developed.  HENT:     Head: Normocephalic and atraumatic.  Cardiovascular:     Rate and Rhythm: Normal rate and regular rhythm.     Heart sounds: No murmur heard. Pulmonary:     Effort: Pulmonary effort is normal. No respiratory distress.     Breath sounds: Normal breath sounds.  Abdominal:     Palpations: Abdomen is soft.     Tenderness: There is no abdominal tenderness. There is no guarding or rebound.  Musculoskeletal:        General: Swelling present. No tenderness.     Comments: 2+ pitting edema to LLE, 1+ pitting edema to RLE (unchanged from baseline).   Skin:    General: Skin is warm and dry.  Neurological:     Mental Status: He is alert and oriented to person, place, and time.  Psychiatric:        Behavior: Behavior normal.    ED Results / Procedures / Treatments   Labs (all labs  ordered are listed, but only abnormal results are displayed) Labs Reviewed  COMPREHENSIVE METABOLIC PANEL - Abnormal; Notable for the following components:      Result Value   Anion gap 4 (*)    All other components within normal limits  CBC WITH DIFFERENTIAL/PLATELET  URINALYSIS, ROUTINE W REFLEX MICROSCOPIC  CBG MONITORING, ED  TROPONIN I (HIGH SENSITIVITY)  TROPONIN I (HIGH SENSITIVITY)    EKG EKG Interpretation  Date/Time:  Thursday May 12 2021 20:40:47 EST Ventricular Rate:  72 PR Interval:  108 QRS  Duration: 93 QT Interval:  498 QTC Calculation: 546 R Axis:   -24 Text Interpretation: Sinus or ectopic atrial rhythm Short PR interval Borderline left axis deviation Anteroseptal infarct, age indeterminate Prolonged QT interval Confirmed by Quintella Reichert [phone]) on 05/13/2021 4:55:10 AM  Radiology DG Chest 2 View  Result Date: 05/12/2021 CLINICAL DATA:  Presyncope EXAM: CHEST - 2 VIEW COMPARISON:  03/22/2018 FINDINGS: The lungs are symmetrically well expanded. No confluent pulmonary infiltrate. Tiny left pleural effusion. No pneumothorax. Cardiac size within normal limits. Pulmonary vascularity is normal. No acute bone abnormality peer IMPRESSION: Tiny left pleural effusion. Electronically Signed   By: Fidela Salisbury M.D.   On: 05/12/2021 21:04    Procedures Procedures    Medications Ordered in ED Medications - No data to display  ED Course/ Medical Decision Making/ A&P                           Medical Decision Making Amount and/or Complexity of Data Reviewed Labs: ordered.   Patient with history of aortic aneurysm, hypertension here for evaluation following near syncopal episode in setting of recent initiation of new antihypertensive.  He did have hypotension prior to ED arrival, resolved during his ED stay.  Labs with normal renal function, no anemia.  He does have lower extremity edema, which is at his baseline.  EKG is without acute ischemic changes.  Current  clinical picture is not consistent with sepsis, PE, acute CHF, arrhythmia, expansion of his known aneurysm.  Plan to discharge home on a lower dose of his losartan with outpatient follow-up and return precautions.        Final Clinical Impression(s) / ED Diagnoses Final diagnoses:  Near syncope  Hypotensive episode    Rx / DC Orders ED Discharge Orders     None         Quintella Reichert, MD 05/13/21 [phone]

## 2021-05-16 ENCOUNTER — Encounter (HOSPITAL_BASED_OUTPATIENT_CLINIC_OR_DEPARTMENT_OTHER): Payer: Self-pay

## 2021-05-16 ENCOUNTER — Encounter: Payer: Self-pay | Admitting: Internal Medicine

## 2021-05-16 NOTE — Telephone Encounter (Signed)
Please advise on BP readings and let me know if you would like to see him sooner!

## 2021-05-18 ENCOUNTER — Telehealth: Payer: Self-pay | Admitting: Cardiology

## 2021-05-18 ENCOUNTER — Telehealth (HOSPITAL_BASED_OUTPATIENT_CLINIC_OR_DEPARTMENT_OTHER): Payer: Self-pay

## 2021-05-18 NOTE — Telephone Encounter (Signed)
Please advise 

## 2021-05-18 NOTE — Telephone Encounter (Signed)
Patient's wife is calling about the MyChart message they sent last week about his BP readings.  She states it was going to be sent to Dr. Harrell Gave, but she still hasn't heard back from our office.

## 2021-05-18 NOTE — Telephone Encounter (Signed)
RN called patient with recommendations. Got patient rescheduled to March 17th at 11:40 am with Dr. Harrell Gave. Patient is going to keep splitting Losartan and tracking BP until Monday. RN to follow up on Monday and can adjust to amlodipine if needed!    "Overall most readings are within a good range, but AM seems to be the highest. Given the range, it's ok to continue the current splitting of losartan if he is feeling ok.   I would be hesitant to increase the dose despite the high AM readings given the concern for lightheadedness.   The other option is to stop the losartan and try amlodipine 5 mg daily, but as we discussed this might cause some increase in leg swelling.   Either way I would move his appt up to see me or APP. Thanks."

## 2021-05-18 NOTE — Telephone Encounter (Signed)
Overall most readings are within a good range, but AM seems to be the highest. Given the range, it's ok to continue the current splitting of losartan if he is feeling ok. I would be hesitant to increase the dose despite the high AM readings given the concern for lightheadedness. The other option is to stop the losartan and try amlodipine 5 mg daily, but as we discussed this might cause some increase in leg swelling. Either way I would move his appt up to see me or APP. Thanks.

## 2021-05-19 ENCOUNTER — Other Ambulatory Visit: Payer: Self-pay | Admitting: Internal Medicine

## 2021-05-23 ENCOUNTER — Encounter (HOSPITAL_BASED_OUTPATIENT_CLINIC_OR_DEPARTMENT_OTHER): Payer: Self-pay

## 2021-05-24 NOTE — Telephone Encounter (Signed)
This is a [facility] patient, we were doing a trial of splitting his losartan to help with BP control, he just sent me his BP readings. Can you look to see if  we need to adjust anything right now, or can it wait until his appointment on 3/17 with Harrell Gave! Thanks!

## 2021-05-24 NOTE — Telephone Encounter (Signed)
Continue Losartan half tablet daily. Recommend follow up as scheduled with Dr. Harrell Gave 06/10/21. If he gets a systolic (top number) blood pressure reading less than 100, recommend eating and drinking something as this can help increase the blood pressure. To help keep blood pressure consistent, recommend eating and drinking regularly and checking when he has been sitting for 5-10 minutes.   Loel Dubonnet, NP

## 2021-05-25 DIAGNOSIS — Z79899 Other long term (current) drug therapy: Secondary | ICD-10-CM | POA: Diagnosis not present

## 2021-05-26 LAB — BASIC METABOLIC PANEL
BUN/Creatinine Ratio: 10 (ref 10–24)
BUN: 8 mg/dL (ref 8–27)
CO2: 26 mmol/L (ref 20–29)
Calcium: 9 mg/dL (ref 8.6–10.2)
Chloride: 105 mmol/L (ref 96–106)
Creatinine, Ser: 0.81 mg/dL (ref 0.76–1.27)
Glucose: 104 mg/dL — ABNORMAL HIGH (ref 70–99)
Potassium: 4.5 mmol/L (ref 3.5–5.2)
Sodium: 143 mmol/L (ref 134–144)
eGFR: 95 mL/min/{1.73_m2} (ref >59)

## 2021-05-30 ENCOUNTER — Encounter: Payer: Self-pay | Admitting: Internal Medicine

## 2021-05-30 ENCOUNTER — Other Ambulatory Visit: Payer: Self-pay

## 2021-05-30 ENCOUNTER — Ambulatory Visit (INDEPENDENT_AMBULATORY_CARE_PROVIDER_SITE_OTHER): Payer: Medicare Other | Admitting: Internal Medicine

## 2021-05-30 DIAGNOSIS — F332 Major depressive disorder, recurrent severe without psychotic features: Secondary | ICD-10-CM

## 2021-05-30 DIAGNOSIS — R0989 Other specified symptoms and signs involving the circulatory and respiratory systems: Secondary | ICD-10-CM | POA: Diagnosis not present

## 2021-05-30 DIAGNOSIS — M5442 Lumbago with sciatica, left side: Secondary | ICD-10-CM

## 2021-05-30 DIAGNOSIS — M170 Bilateral primary osteoarthritis of knee: Secondary | ICD-10-CM

## 2021-05-30 DIAGNOSIS — M5441 Lumbago with sciatica, right side: Secondary | ICD-10-CM

## 2021-05-30 DIAGNOSIS — G8929 Other chronic pain: Secondary | ICD-10-CM

## 2021-05-30 DIAGNOSIS — I2510 Atherosclerotic heart disease of native coronary artery without angina pectoris: Secondary | ICD-10-CM | POA: Diagnosis not present

## 2021-05-30 DIAGNOSIS — E538 Deficiency of other specified B group vitamins: Secondary | ICD-10-CM | POA: Diagnosis not present

## 2021-05-30 LAB — CBC WITH DIFFERENTIAL/PLATELET
Basophils Absolute: 0 10*3/uL (ref 0.0–0.1)
Basophils Relative: 0.5 % (ref 0.0–3.0)
Eosinophils Absolute: 0.2 10*3/uL (ref 0.0–0.7)
Eosinophils Relative: 3.4 % (ref 0.0–5.0)
HCT: 42.7 % (ref 39.0–52.0)
Hemoglobin: 14.5 g/dL (ref 13.0–17.0)
Lymphocytes Relative: 22.5 % (ref 12.0–46.0)
Lymphs Abs: 1.6 10*3/uL (ref 0.7–4.0)
MCHC: 34 g/dL (ref 30.0–36.0)
MCV: 94.2 fl (ref 78.0–100.0)
Monocytes Absolute: 0.5 10*3/uL (ref 0.1–1.0)
Monocytes Relative: 7 % (ref 3.0–12.0)
Neutro Abs: 4.6 10*3/uL (ref 1.4–7.7)
Neutrophils Relative %: 66.6 % (ref 43.0–77.0)
Platelets: 211 10*3/uL (ref 150.0–400.0)
RBC: 4.53 Mil/uL (ref 4.22–5.81)
RDW: 13.8 % (ref 11.5–15.5)
WBC: 7 10*3/uL (ref 4.0–10.5)

## 2021-05-30 LAB — COMPREHENSIVE METABOLIC PANEL
ALT: 30 U/L (ref 0–53)
AST: 22 U/L (ref 0–37)
Albumin: 4.3 g/dL (ref 3.5–5.2)
Alkaline Phosphatase: 59 U/L (ref 39–117)
BUN: 8 mg/dL (ref 6–23)
CO2: 28 mEq/L (ref 19–32)
Calcium: 9.5 mg/dL (ref 8.4–10.5)
Chloride: 102 mEq/L (ref 96–112)
Creatinine, Ser: 0.97 mg/dL (ref 0.40–1.50)
GFR: 79.29 mL/min (ref >60.00)
Glucose, Bld: 113 mg/dL — ABNORMAL HIGH (ref 70–99)
Potassium: 4.5 mEq/L (ref 3.5–5.1)
Sodium: 139 mEq/L (ref 135–145)
Total Bilirubin: 0.7 mg/dL (ref 0.2–1.2)
Total Protein: 6.7 g/dL (ref 6.0–8.3)

## 2021-05-30 LAB — CORTISOL: Cortisol, Plasma: 10.4 ug/dL

## 2021-05-30 MED ORDER — PREDNISONE 10 MG PO TABS: ORAL_TABLET | ORAL | 0 refills | Status: DC

## 2021-05-30 MED ORDER — FENTANYL 25 MCG/HR TD PT72: 1.0000 | MEDICATED_PATCH | TRANSDERMAL | 0 refills | Status: DC

## 2021-05-30 MED ORDER — OXYCODONE HCL 5 MG PO TABS: 10.0000 mg | ORAL_TABLET | Freq: Four times a day (QID) | ORAL | 0 refills | Status: DC | PRN

## 2021-05-30 MED ORDER — NARCAN 4 MG/0.1ML NA LIQD: 0.4000 mg | NASAL | 3 refills | Status: DC | PRN

## 2021-05-30 NOTE — Assessment & Plan Note (Addendum)
Labile BP - recent.  ?Check Cortisol level ?Start Prednisone taper to help with pain control ?

## 2021-05-30 NOTE — Assessment & Plan Note (Addendum)
Not doing too well.  She attributes he is depression due to poor pain control.  Pain control discussed ?

## 2021-05-30 NOTE — Assessment & Plan Note (Signed)
On B12/B complex 

## 2021-05-30 NOTE — Assessment & Plan Note (Addendum)
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 Potential benefits of a long term opioids use as well as potential risks (i.e. addiction risk, apnea etc) and complications (i.e. Somnolence, constipation and others) were explained to the patient and were aknowledged. ? ?

## 2021-05-30 NOTE — Progress Notes (Signed)
Subjective:  Patient ID: Lee Owens, male    DOB: September 23, 1950  Age: 71 y.o. MRN: [id number]  CC: Hospitalization Follow-up (From 2/16)   HPI [name] presents for chronic pain, spasticity, depression.  He is complaining of severe pain.  He thinks that his current pain regimen is not sufficient to provide good pain control and no longer.  He is miserable.  She is tearful.  His quality of life is very poor. He is here with his wife Lee Owens who helps with history C/o low BP on 2/16  SBP 60 - almost passed out, SBP was low. IVF helped. On Losartan 1/2 tab a day  Drinking diet Pepci  16 oz 5 a day 2/17 1535 - 160/105 1730 - 122/87 1830 - 162/117 2030 - 105/74 2220 - 123/88 2/18 1030 - 175/112 1438 - 127/90 1815 - 125/892000 - 166/105 2130 - 118/85 2/19 1215 - 169/102 1430 - 143/101 1645 - 93/69 1830 - 146/95 2215 - 117/76 2/20 1130 - 164/106 1440 - 149/97 1600 - 110/82 1900 - 80/59 2230 - 103/77 2/21  1050 - 187/118 1300 - 149/100 1845 - 147/100 2215 - 104/74 2/22 1015 - 122/83 1330 - 144/85 1600 - 77/59 2250 - 112/68 2/23 1030 - 150/93 1520 - 181/123 1845 - 113/75 2215 - 134/85 2/24 1100 - 146/109 1400 - 123/91 1810 - 121/84 2230 - 126/83 2/25 1330 - 123/96 1700 - 143/98 1830 - 160/103 2100 - 92/59 2/26 1230 - 144/98 1800 - 93/65 2245 - 102/75 2/27 1200 - 167/109 2240 - 108/80 2/28 1015 - 158/102  Outpatient Medications Prior to Visit  Medication Sig Dispense Refill   allopurinol (ZYLOPRIM) 300 MG tablet TAKE 1 TABLET(300 MG) BY MOUTH DAILY 90 tablet 2   buPROPion (WELLBUTRIN XL) 150 MG 24 hr tablet TAKE 2 TABLETS(300 MG) BY MOUTH DAILY 180 tablet 1   Cholecalciferol (VITAMIN D-3) 5000 UNITS TABS Take 5,000 Units by mouth daily.     Coenzyme Q10 (COQ10) 400 MG CAPS 1 po qd 100 capsule 3   diazepam (VALIUM) 10 MG tablet TAKE 1/2 TO 1 TABLET(5 TO 10 MG) BY MOUTH EVERY 8 HOURS AS NEEDED FOR ANXIETY OR MUSCLE SPASMS 90 tablet 3   DULoxetine  (CYMBALTA) 60 MG capsule TAKE 1 CAPSULE(60 MG) BY MOUTH TWICE DAILY 60 capsule 5   fentaNYL (DURAGESIC) 25 MCG/HR Place 1 patch onto the skin every other day. 15 patch 0   fentaNYL (DURAGESIC) 25 MCG/HR Place 1 patch onto the skin every other day. 15 patch 0   finasteride (PROSCAR) 5 MG tablet Take 5 mg by mouth daily.     gabapentin (NEURONTIN) 300 MG capsule Take 1 capsule (300 mg total) by mouth 3 (three) times daily. 270 capsule 3   L-Methylfolate-Algae 15-90.314 MG CAPS TAKE 1 CAPSULE BY MOUTH ONCE DAILY 30 capsule 11   losartan (COZAAR) 25 MG tablet Take 0.5 tablets (12.5 mg total) by mouth daily. 45 tablet 3   lovastatin (MEVACOR) 40 MG tablet TAKE 1 TABLET(40 MG) BY MOUTH AT BEDTIME 90 tablet 3   oxyCODONE (OXY IR/ROXICODONE) 5 MG immediate release tablet Take 2 tablets (10 mg total) by mouth every 8 (eight) hours as needed for severe pain (chronic back pain). 180 tablet 0   oxyCODONE (OXY IR/ROXICODONE) 5 MG immediate release tablet Take 2 tablets (10 mg total) by mouth every 8 (eight) hours as needed for severe pain (chronic back pain). 180 tablet 0   tamsulosin (FLOMAX) 0.4 MG CAPS capsule Take  1 capsule by mouth daily.  2   temazepam (RESTORIL) 30 MG capsule TAKE 1 TO 2 CAPSULES BY MOUTH AT BEDTIME AS NEEDED FOR SLEEP 180 capsule 0   vitamin B-12 (CYANOCOBALAMIN) 1000 MCG tablet Take 2,000 mcg by mouth daily.     fentaNYL (DURAGESIC) 25 MCG/HR Place 1 patch onto the skin every other day. 15 patch 0   NARCAN 4 MG/0.1ML LIQD nasal spray kit as needed.  0   oxyCODONE (OXY IR/ROXICODONE) 5 MG immediate release tablet Take 2 tablets (10 mg total) by mouth every 8 (eight) hours as needed for severe pain (chronic back pain). 180 tablet 0   No facility-administered medications prior to visit.    ROS: Review of Systems  Constitutional:  Positive for fatigue. Negative for appetite change and unexpected weight change.  HENT:  Negative for congestion, nosebleeds, sneezing, sore throat and  trouble swallowing.   Eyes:  Negative for itching and visual disturbance.  Respiratory:  Negative for cough.   Cardiovascular:  Negative for chest pain, palpitations and leg swelling.  Gastrointestinal:  Negative for abdominal distention, blood in stool, diarrhea and nausea.  Genitourinary:  Negative for frequency and hematuria.  Musculoskeletal:  Positive for arthralgias and gait problem. Negative for back pain, joint swelling and neck pain.  Skin:  Negative for rash.  Neurological:  Positive for weakness. Negative for dizziness, tremors and speech difficulty.  Psychiatric/Behavioral:  Negative for agitation, dysphoric mood, sleep disturbance and suicidal ideas. The patient is nervous/anxious.    Objective:  BP 132/90 (BP Location: Left Arm, Patient Position: Sitting, Cuff Size: Large)    Pulse 80    Temp 97.9 F (36.6 C) (Oral)    Ht 6' 1"  (1.854 m)    Wt 240 lb (108.9 kg)    SpO2 95%    BMI 31.66 kg/m   BP Readings from Last 3 Encounters:  05/30/21 132/90  05/13/21 (!) 150/98  05/02/21 (!) 148/98    Wt Readings from Last 3 Encounters:  05/30/21 240 lb (108.9 kg)  05/13/21 240 lb (108.9 kg)  05/02/21 243 lb 1.6 oz (110.3 kg)    Physical Exam Constitutional:      General: He is not in acute distress.    Appearance: He is well-developed. He is not diaphoretic.     Comments: NAD  HENT:     Head: Normocephalic and atraumatic.     Right Ear: External ear normal.     Left Ear: External ear normal.     Nose: Nose normal.     Mouth/Throat:     Pharynx: No oropharyngeal exudate.  Eyes:     General: No scleral icterus.       Right eye: No discharge.        Left eye: No discharge.     Conjunctiva/sclera: Conjunctivae normal.     Pupils: Pupils are equal, round, and reactive to light.  Neck:     Thyroid: No thyromegaly.     Vascular: No JVD.     Trachea: No tracheal deviation.  Cardiovascular:     Rate and Rhythm: Normal rate and regular rhythm.     Heart sounds: Normal  heart sounds. No murmur heard.   No friction rub. No gallop.  Pulmonary:     Effort: Pulmonary effort is normal. No respiratory distress.     Breath sounds: Normal breath sounds. No stridor. No wheezing or rales.  Chest:     Chest wall: No tenderness.  Abdominal:     General:  Bowel sounds are normal. There is no distension.     Palpations: Abdomen is soft. There is no mass.     Tenderness: There is no abdominal tenderness. There is no guarding or rebound.  Genitourinary:    Penis: Normal. No tenderness.      Prostate: Normal.     Rectum: Normal. Guaiac result negative.  Musculoskeletal:        General: Tenderness present. Normal range of motion.     Cervical back: Normal range of motion and neck supple.  Lymphadenopathy:     Cervical: No cervical adenopathy.  Skin:    General: Skin is warm and dry.     Coloration: Skin is not pale.     Findings: No erythema or rash.  Neurological:     Mental Status: He is alert and oriented to person, place, and time.     Cranial Nerves: No cranial nerve deficit.     Motor: No abnormal muscle tone.     Coordination: Coordination normal.     Gait: Gait normal.     Deep Tendon Reflexes: Reflexes are normal and symmetric. Reflexes normal.  Psychiatric:        Behavior: Behavior normal.        Thought Content: Thought content normal.        Judgment: Judgment normal.  Antalgic gait    A total time of 45 minutes was spent preparing to see the patient, reviewing tests, x-rays, operative reports and other medical records.  Also, obtaining history and performing comprehensive physical exam.  Additionally, counseling the patient regarding the above listed issues -near syncopal episode, labile blood pressure, pain control.   Finally, documenting clinical information in the health records, coordination of care, educating the patient. It is a complex case.   Lab Results  Component Value Date   WBC 7.0 05/30/2021   HGB 14.5 05/30/2021   HCT 42.7  05/30/2021   PLT 211.0 05/30/2021   GLUCOSE 113 (H) 05/30/2021   CHOL 156 12/14/2020   TRIG 102.0 12/14/2020   HDL 52.00 12/14/2020   LDLCALC 83 12/14/2020   ALT 30 05/30/2021   AST 22 05/30/2021   NA 139 05/30/2021   K 4.5 05/30/2021   CL 102 05/30/2021   CREATININE 0.97 05/30/2021   BUN 8 05/30/2021   CO2 28 05/30/2021   TSH 1.00 12/14/2020   PSA 0.76 06/29/2016   INR 0.96 06/30/2013   HGBA1C 5.7 12/14/2020    DG Chest 2 View  Result Date: 05/12/2021 CLINICAL DATA:  Presyncope EXAM: CHEST - 2 VIEW COMPARISON:  03/22/2018 FINDINGS: The lungs are symmetrically well expanded. No confluent pulmonary infiltrate. Tiny left pleural effusion. No pneumothorax. Cardiac size within normal limits. Pulmonary vascularity is normal. No acute bone abnormality peer IMPRESSION: Tiny left pleural effusion. Electronically Signed   By: Fidela Salisbury M.D.   On: 05/12/2021 21:04    Assessment & Plan:   Problem List Items Addressed This Visit     Depression    Not doing too well.  She attributes he is depression due to poor pain control.  Pain control discussed      Knee osteoarthritis    S/p B TKR B severe      Relevant Medications   oxyCODONE (OXY IR/ROXICODONE) 5 MG immediate release tablet   fentaNYL (DURAGESIC) 25 MCG/HR   predniSONE (DELTASONE) 10 MG tablet   Labile blood pressure    Labile BP - recent.  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 addiction risk, apnea etc) and complications (i.e. Somnolence, constipation and others) were explained to the patient and were aknowledged.        Relevant Medications   oxyCODONE (OXY IR/ROXICODONE) 5 MG immediate release tablet   fentaNYL (DURAGESIC) 25 MCG/HR   predniSONE (DELTASONE) 10 MG tablet   Vitamin B12 deficiency    On B12/B complex         Meds ordered this encounter  Medications   oxyCODONE (OXY IR/ROXICODONE) 5 MG immediate release tablet    Sig: Take 2 tablets (10 mg total) by mouth every 6 (six) hours as needed for severe pain (chronic back pain).    Dispense:  240 tablet    Refill:  0    Please fill on or after 06/15/21   fentaNYL (DURAGESIC) 25 MCG/HR    Sig: Place 1 patch onto the skin every other day.    Dispense:  15 patch    Refill:  0    Please fill on or after 06/15/20   NARCAN 4 MG/0.1ML LIQD nasal spray kit    Sig: Place 0.1 sprays (0.4 mg total) into the nose as needed.    Dispense:  2 each    Refill:  3   predniSONE (DELTASONE) 10 MG tablet    Sig: Prednisone 10 mg: take 4 tabs a day x 3 days; then 3 tabs a day x 4 days; then 2 tabs a day x 4 days, then 1 tab a day x 6 days, then stop. Take pc.    Dispense:  38 tablet    Refill:  0      Follow-up: Return in about 4 weeks (around 06/27/2021) for a follow-up visit.  Walker Kehr, MD

## 2021-05-30 NOTE — Assessment & Plan Note (Signed)
S/p B TKR ?B severe ?

## 2021-06-09 NOTE — Progress Notes (Signed)
?Cardiology Office Note:   ? ?Date:  06/09/2021  ? ?ID:  Lee Owens, DOB 1950/08/17, MRN [id number] ? ?PCP:  Plotnikov, Evie Lacks, MD  ?Cardiologist:  Buford Dresser, MD PhD ? ?Referring MD: Cassandria Anger, MD  ? ?CC: follow up ? ?History of Present Illness:   ? ?Lee Owens is a 71 y.o. male with a hx of hypertension who was initially seen via telemedicine visit 06/25/18. He was referred after calcium score was done by Dr. Alain Marion.  ? ?Cardiac history: he was started on rosuvastatin 10 mg in the past. He was changed back to lovastatin at his visit with Dr. Alain Marion 09/26/18. He states muscle pain was severe on rosuvastatin, returned to lovastatin. Now tolerating again without issue. Reviewed history, he is unsure of all of the statins he has tried (documentation of simvastatin, unsure about atorvastatin). ? ?Has history of high blood pressure, was taken off BP meds due to lightheadedness.   ? ?Brother had heart attack in his early 60s. Reportedly has poor health habits. ? ?Today: ?Since his last appointment he was seen in the ED 05/12/2021 for a near syncopal episode. He had recently been started on 25 mg losartan, but developed dizziness after several days. He withheld the medication for 1 day, but then resumed it after noticing elevated blood pressures. While sitting, he felt ill and became diaphoretic with unintelligible speech for 30 minutes. His blood pressure was 60-70/40. He presented to the ED by EMS. He was discharged on a lower dose of losartan with outpatient follow-up. ? ?He is accompanied by his wife. Overall, he is feeling "stressed to the max". He notes multiple life stressors lately. ? ?This morning, his blood pressure was 200+/140, prior to taking his antihypertensives. He usually takes his medication between 9:30-11:30 AM. At night he notes both systolic and diastolic pressure may be less than 100 (such as 80/60). Typically "he feels weird" as his blood pressure starts to drop. He  believes that his blood pressure is often elevated due to his chronic back pain. In the afternoon his pain seems to be more controlled than at other times of day. ? ?He does not have restful sleep. Normally he goes to bed late and wakes up at least 4 times a night due to his dogs. ? ?He denies any palpitations, chest pain, shortness of breath, or peripheral edema. No lightheadedness, headaches, syncope, orthopnea, or PND. ? ? ?Past Medical History:  ?Diagnosis Date  ? Allergic rhinitis   ? Anxiety   ? takes Valium bid  ? Arthritis   ? "hips, knees, hands" (07/07/2013)  ? Back pain   ? occasionally but reason unknown  ? BPH (benign prostatic hypertrophy)   ? takes Proscar daily  ? Chronic fatigue   ? Complication of anesthesia   ? Per pt, "Hard to sedate" past last colonoscopy in 2008!  ? Depression   ? takes Cymbalta daily  ? Diverticulosis   ? Dizziness   ? due to dehydration  ? Hemorrhoids   ? Hyperlipidemia   ? takes Lovastatin daily  ? Hypertension   ? Insomnia   ? takes Restoril nightly   ? Joint pain   ? knees/ankles/back pain  ? Joint swelling   ? Muscle pain   ? takes Baclofen daily  ? Night muscle spasms   ? takes Valium as needed  ? Pneumonia 1971  ? Right knee DJD   ? Urinary frequency   ? takes rapaflo daily  ? Vitamin B12 deficiency  2009  ? Vitamin D deficiency disease   ? ? ?Past Surgical History:  ?Procedure Laterality Date  ? COLONOSCOPY    ? JOINT REPLACEMENT    ? both knees  ? KNEE ARTHROSCOPY Bilateral 2012  ? PROSTATE BIOPSY  01/2010  ? 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 Procedure: TOTAL KNEE ARTHROPLASTY- left;  Surgeon: Lorn Junes, MD;  Location: [facility];  Service: Orthopedics;  Laterality: Left;  ? TOTAL KNEE ARTHROPLASTY Right 07/07/2013  ? TOTAL KNEE ARTHROPLASTY Right 07/07/2013  ? Procedure: TOTAL KNEE ARTHROPLASTY- right;  Surgeon: Lorn Junes, MD;  Location: [facility];  Service: Orthopedics;  Laterality: Right;  ? ? ?Current Medications: ?Current Outpatient Medications on File  Prior to Visit  ?Medication Sig  ? allopurinol (ZYLOPRIM) 300 MG tablet TAKE 1 TABLET(300 MG) BY MOUTH DAILY  ? buPROPion (WELLBUTRIN XL) 150 MG 24 hr tablet TAKE 2 TABLETS(300 MG) BY MOUTH DAILY  ? Cholecalciferol (VITAMIN D-3) 5000 UNITS TABS Take 5,000 Units by mouth daily.  ? Coenzyme Q10 (COQ10) 400 MG CAPS 1 po qd  ? diazepam (VALIUM) 10 MG tablet TAKE 1/2 TO 1 TABLET(5 TO 10 MG) BY MOUTH EVERY 8 HOURS AS NEEDED FOR ANXIETY OR MUSCLE SPASMS  ? DULoxetine (CYMBALTA) 60 MG capsule TAKE 1 CAPSULE(60 MG) BY MOUTH TWICE DAILY  ? fentaNYL (DURAGESIC) 25 MCG/HR Place 1 patch onto the skin every other day.  ? fentaNYL (DURAGESIC) 25 MCG/HR Place 1 patch onto the skin every other day.  ? fentaNYL (DURAGESIC) 25 MCG/HR Place 1 patch onto the skin every other day.  ? finasteride (PROSCAR) 5 MG tablet Take 5 mg by mouth daily.  ? gabapentin (NEURONTIN) 300 MG capsule Take 1 capsule (300 mg total) by mouth 3 (three) times daily.  ? L-Methylfolate-Algae 15-90.314 MG CAPS TAKE 1 CAPSULE BY MOUTH ONCE DAILY  ? losartan (COZAAR) 25 MG tablet Take 0.5 tablets (12.5 mg total) by mouth daily.  ? lovastatin (MEVACOR) 40 MG tablet TAKE 1 TABLET(40 MG) BY MOUTH AT BEDTIME  ? NARCAN 4 MG/0.1ML LIQD nasal spray kit Place 0.1 sprays (0.4 mg total) into the nose as needed.  ? oxyCODONE (OXY IR/ROXICODONE) 5 MG immediate release tablet Take 2 tablets (10 mg total) by mouth every 8 (eight) hours as needed for severe pain (chronic back pain).  ? oxyCODONE (OXY IR/ROXICODONE) 5 MG immediate release tablet Take 2 tablets (10 mg total) by mouth every 8 (eight) hours as needed for severe pain (chronic back pain).  ? oxyCODONE (OXY IR/ROXICODONE) 5 MG immediate release tablet Take 2 tablets (10 mg total) by mouth every 6 (six) hours as needed for severe pain (chronic back pain).  ? predniSONE (DELTASONE) 10 MG tablet Prednisone 10 mg: take 4 tabs a day x 3 days; then 3 tabs a day x 4 days; then 2 tabs a day x 4 days, then 1 tab a day x 6 days,  then stop. Take pc.  ? tamsulosin (FLOMAX) 0.4 MG CAPS capsule Take 1 capsule by mouth daily.  ? temazepam (RESTORIL) 30 MG capsule TAKE 1 TO 2 CAPSULES BY MOUTH AT BEDTIME AS NEEDED FOR SLEEP  ? vitamin B-12 (CYANOCOBALAMIN) 1000 MCG tablet Take 2,000 mcg by mouth daily.  ? ?No current facility-administered medications on file prior to visit.  ?  ? ?Allergies:   Crestor [rosuvastatin calcium]  ? ?Social History  ? ?Tobacco Use  ? Smoking status: Former  ?  Packs/day: 1.00  ?  Years: 38.00  ?  Pack years: 38.00  ?  Types: Cigarettes  ?  Quit date: 10/23/2005  ?  Years since quitting: 15.6  ? Smokeless tobacco: Never  ? Tobacco comments:  ?  quit smoking 2008  ?Substance Use Topics  ? Alcohol use: Yes  ?  Alcohol/week: 14.0 standard drinks  ?  Types: 14 Cans of beer per week  ?  Comment: 03/09/17 "2 beers daily"  ? Drug use: No  ?  Comment: quit age 31  ? ? ?Family History: ?The patient's family history includes Atrial fibrillation in his father; Coronary artery disease in his father and another family member; Dementia in his sister; Heart failure in his father; Hypertension in an other family member; Kidney disease in his father; Liver cancer in his mother. ? ?ROS:   ?Please see the history of present illness.   ?(+) Stress ?(+) Chronic back pain ?Additional pertinent ROS negative except as documented ? ?EKGs/Labs/Other Studies Reviewed:   ? ?The following studies were reviewed today: ? ?CTA Chest/Aorta 04/27/2021: ?COMPARISON:  05/19/2020; 04/30/2019; coronary artery calcium ?scoring-05/21/2018 ?  ?FINDINGS: ?Vascular Findings: ?  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 ?  ?The thoracic aorta tapers to a normal caliber at the level of the ?aortic arch. There is a minimal amount of calcified atherosclerotic ?plaque involving the aortic arch, not resulting in a hemodynamically ?significant stenosis. The descending thoracic aorta is of normal ?caliber and  widely patent without hemodynamically significant ?narrowing. No evidence of thoracic aortic dissection or perivascular ?stranding on this nongated examination ?  ?Conventional configuration of the aortic arc

## 2021-06-10 ENCOUNTER — Other Ambulatory Visit: Payer: Self-pay

## 2021-06-10 ENCOUNTER — Encounter (HOSPITAL_BASED_OUTPATIENT_CLINIC_OR_DEPARTMENT_OTHER): Payer: Self-pay | Admitting: Cardiology

## 2021-06-10 ENCOUNTER — Ambulatory Visit (INDEPENDENT_AMBULATORY_CARE_PROVIDER_SITE_OTHER): Payer: Medicare Other | Admitting: Cardiology

## 2021-06-10 VITALS — BP 143/85 | HR 94 | Ht 73.0 in | Wt 247.1 lb

## 2021-06-10 DIAGNOSIS — I2510 Atherosclerotic heart disease of native coronary artery without angina pectoris: Secondary | ICD-10-CM

## 2021-06-10 DIAGNOSIS — E7800 Pure hypercholesterolemia, unspecified: Secondary | ICD-10-CM | POA: Diagnosis not present

## 2021-06-10 DIAGNOSIS — I10 Essential (primary) hypertension: Secondary | ICD-10-CM

## 2021-06-10 DIAGNOSIS — Z7189 Other specified counseling: Secondary | ICD-10-CM

## 2021-06-10 DIAGNOSIS — I7121 Aneurysm of the ascending aorta, without rupture: Secondary | ICD-10-CM

## 2021-06-10 NOTE — Patient Instructions (Signed)
Medication Instructions:  ?START TAKING YOUR LOSARTAN AT NIGHT BEFORE YOU GO TO BED  ? ?*If you need a refill on your cardiac medications before your next appointment, please call your pharmacy* ? ?Lab Work: ?NONE ? ?Testing/Procedures: ?NONE ? ?Follow-Up: ?At [facility], you and your health needs are our priority.  As part of our continuing mission to provide you with exceptional heart care, we have created designated Provider Care Teams.  These Care Teams include your primary Cardiologist (physician) and Advanced Practice Providers (APPs -  Physician Assistants and Nurse Practitioners) who all work together to provide you with the care you need, when you need it. ? ?We recommend signing up for the patient portal called "MyChart".  Sign up information is provided on this After Visit Summary.  MyChart is used to connect with patients for Virtual Visits (Telemedicine).  Patients are able to view lab/test results, encounter notes, upcoming appointments, etc.  Non-urgent messages can be sent to your provider as well.   ?To learn more about what you can do with MyChart, go to [email or web address].   ? ?Your next appointment:   ?08/08/2021 2:00 PM WITH DR CHRISTOPHER  ?

## 2021-06-14 ENCOUNTER — Ambulatory Visit: Payer: Medicare Other | Admitting: Internal Medicine

## 2021-06-28 ENCOUNTER — Ambulatory Visit (INDEPENDENT_AMBULATORY_CARE_PROVIDER_SITE_OTHER): Payer: Medicare Other | Admitting: Internal Medicine

## 2021-06-28 ENCOUNTER — Encounter: Payer: Self-pay | Admitting: Internal Medicine

## 2021-06-28 DIAGNOSIS — I2510 Atherosclerotic heart disease of native coronary artery without angina pectoris: Secondary | ICD-10-CM

## 2021-06-28 DIAGNOSIS — M5441 Lumbago with sciatica, right side: Secondary | ICD-10-CM | POA: Diagnosis not present

## 2021-06-28 DIAGNOSIS — G8929 Other chronic pain: Secondary | ICD-10-CM

## 2021-06-28 DIAGNOSIS — J3089 Other allergic rhinitis: Secondary | ICD-10-CM | POA: Diagnosis not present

## 2021-06-28 DIAGNOSIS — M5442 Lumbago with sciatica, left side: Secondary | ICD-10-CM

## 2021-06-28 DIAGNOSIS — J0100 Acute maxillary sinusitis, unspecified: Secondary | ICD-10-CM

## 2021-06-28 DIAGNOSIS — F332 Major depressive disorder, recurrent severe without psychotic features: Secondary | ICD-10-CM | POA: Diagnosis not present

## 2021-06-28 DIAGNOSIS — M791 Myalgia, unspecified site: Secondary | ICD-10-CM

## 2021-06-28 MED ORDER — CEFDINIR 300 MG PO CAPS: 300.0000 mg | ORAL_CAPSULE | Freq: Two times a day (BID) | ORAL | 0 refills | Status: DC

## 2021-06-28 MED ORDER — OXYCODONE HCL 5 MG PO TABS: 10.0000 mg | ORAL_TABLET | Freq: Four times a day (QID) | ORAL | 0 refills | Status: DC | PRN

## 2021-06-28 MED ORDER — FENTANYL 25 MCG/HR TD PT72: 1.0000 | MEDICATED_PATCH | TRANSDERMAL | 0 refills | Status: DC

## 2021-06-28 NOTE — Assessment & Plan Note (Signed)
Better w/better pain control ?

## 2021-06-28 NOTE — Assessment & Plan Note (Signed)
F/u chronic pain - better on QID oxycodone ?Duragesic q 48 h ?Diazepam prn spasms ? Potential benefits of a long term benzodiazepines  use as well as potential risks  and complications were explained to the patient and were aknowledged. ?Narcan prescription provided ? Potential benefits of a long term opioids use as well as potential risks (i.e. addiction risk, apnea etc) and complications (i.e. Somnolence, constipation and others) were explained to the patient and were aknowledged. ?

## 2021-06-28 NOTE — Progress Notes (Signed)
? ?Subjective:  ?Patient ID: Lee Owens, male    DOB: 1950-09-22  Age: 71 y.o. MRN: [id number] ? ?CC: No chief complaint on file. ? ? ?HPI ?[name] presents for URI sx's  -  ?C/o sinusitis sx's x 2 weeks ?[name] finished abx 1 week ago ?F/u chronic pain - better on QID oxycodone ? ?Outpatient Medications Prior to Visit  ?Medication Sig Dispense Refill  ? allopurinol (ZYLOPRIM) 300 MG tablet TAKE 1 TABLET(300 MG) BY MOUTH DAILY 90 tablet 2  ? buPROPion (WELLBUTRIN XL) 150 MG 24 hr tablet TAKE 2 TABLETS(300 MG) BY MOUTH DAILY 180 tablet 1  ? Cholecalciferol (VITAMIN D-3) 5000 UNITS TABS Take 5,000 Units by mouth daily.    ? Coenzyme Q10 (COQ10) 400 MG CAPS 1 po qd 100 capsule 3  ? diazepam (VALIUM) 10 MG tablet TAKE 1/2 TO 1 TABLET(5 TO 10 MG) BY MOUTH EVERY 8 HOURS AS NEEDED FOR ANXIETY OR MUSCLE SPASMS 90 tablet 3  ? DULoxetine (CYMBALTA) 60 MG capsule TAKE 1 CAPSULE(60 MG) BY MOUTH TWICE DAILY 60 capsule 5  ? finasteride (PROSCAR) 5 MG tablet Take 5 mg by mouth daily.    ? gabapentin (NEURONTIN) 300 MG capsule Take 1 capsule (300 mg total) by mouth 3 (three) times daily. 270 capsule 3  ? losartan (COZAAR) 25 MG tablet Take 12.5 mg by mouth at bedtime.    ? lovastatin (MEVACOR) 40 MG tablet TAKE 1 TABLET(40 MG) BY MOUTH AT BEDTIME 90 tablet 3  ? NARCAN 4 MG/0.1ML LIQD nasal spray kit Place 0.1 sprays (0.4 mg total) into the nose as needed. 2 each 3  ? tamsulosin (FLOMAX) 0.4 MG CAPS capsule Take 1 capsule by mouth daily.  2  ? temazepam (RESTORIL) 30 MG capsule TAKE 1 TO 2 CAPSULES BY MOUTH AT BEDTIME AS NEEDED FOR SLEEP 180 capsule 0  ? vitamin B-12 (CYANOCOBALAMIN) 1000 MCG tablet Take 2,000 mcg by mouth daily.    ? fentaNYL (DURAGESIC) 25 MCG/HR Place 1 patch onto the skin every other day. 15 patch 0  ? fentaNYL (DURAGESIC) 25 MCG/HR Place 1 patch onto the skin every other day. 15 patch 0  ? fentaNYL (DURAGESIC) 25 MCG/HR Place 1 patch onto the skin every other day. 15 patch 0  ? oxyCODONE (OXY  IR/ROXICODONE) 5 MG immediate release tablet Take 2 tablets (10 mg total) by mouth every 8 (eight) hours as needed for severe pain (chronic back pain). 180 tablet 0  ? oxyCODONE (OXY IR/ROXICODONE) 5 MG immediate release tablet Take 2 tablets (10 mg total) by mouth every 8 (eight) hours as needed for severe pain (chronic back pain). 180 tablet 0  ? oxyCODONE (OXY IR/ROXICODONE) 5 MG immediate release tablet Take 2 tablets (10 mg total) by mouth every 6 (six) hours as needed for severe pain (chronic back pain). 240 tablet 0  ? predniSONE (DELTASONE) 10 MG tablet Prednisone 10 mg: take 4 tabs a day x 3 days; then 3 tabs a day x 4 days; then 2 tabs a day x 4 days, then 1 tab a day x 6 days, then stop. Take pc. 38 tablet 0  ? L-Methylfolate-Algae 15-90.314 MG CAPS TAKE 1 CAPSULE BY MOUTH ONCE DAILY 30 capsule 11  ? ?No facility-administered medications prior to visit.  ? ? ?ROS: ?Review of Systems  ?Constitutional:  Negative for appetite change, fatigue and unexpected weight change.  ?HENT:  Positive for postnasal drip, rhinorrhea, sinus pain and sore throat. Negative for congestion, nosebleeds, sneezing and trouble swallowing.   ?  Eyes:  Negative for itching and visual disturbance.  ?Respiratory:  Negative for cough.   ?Cardiovascular:  Negative for chest pain, palpitations and leg swelling.  ?Gastrointestinal:  Negative for abdominal distention, blood in stool, diarrhea and nausea.  ?Genitourinary:  Negative for frequency and hematuria.  ?Musculoskeletal:  Positive for arthralgias, back pain and gait problem. Negative for joint swelling and neck pain.  ?Skin:  Negative for rash.  ?Neurological:  Negative for dizziness, tremors, speech difficulty and weakness.  ?Psychiatric/Behavioral:  Negative for agitation, dysphoric mood and sleep disturbance. The patient is not nervous/anxious.   ? ?Objective:  ?BP 128/84 (BP Location: Left Arm, Patient Position: Sitting, Cuff Size: Large)   Pulse 73   Temp 98.4 ?F (36.9 ?C)  (Oral)   Ht _0  (1.854 m)   Wt 251 lb (113.9 kg)   SpO2 93%   BMI 33.12 kg/m?  ? ?BP Readings from Last 3 Encounters:  ?06/28/21 128/84  ?06/10/21 (!) 143/85  ?05/30/21 132/90  ? ? ?Wt Readings from Last 3 Encounters:  ?06/28/21 251 lb (113.9 kg)  ?06/10/21 247 lb 1.6 oz (112.1 kg)  ?05/30/21 240 lb (108.9 kg)  ? ? ?Physical Exam ?Constitutional:   ?   General: He is not in acute distress. ?   Appearance: He is well-developed.  ?   Comments: NAD  ?Eyes:  ?   Conjunctiva/sclera: Conjunctivae normal.  ?   Pupils: Pupils are equal, round, and reactive to light.  ?Neck:  ?   Thyroid: No thyromegaly.  ?   Vascular: No JVD.  ?Cardiovascular:  ?   Rate and Rhythm: Normal rate and regular rhythm.  ?   Heart sounds: Normal heart sounds. No murmur heard. ?  No friction rub. No gallop.  ?Pulmonary:  ?   Effort: Pulmonary effort is normal. No respiratory distress.  ?   Breath sounds: Normal breath sounds. No wheezing or rales.  ?Chest:  ?   Chest wall: No tenderness.  ?Abdominal:  ?   General: Bowel sounds are normal. There is no distension.  ?   Palpations: Abdomen is soft. There is no mass.  ?   Tenderness: There is no abdominal tenderness. There is no guarding or rebound.  ?Musculoskeletal:     ?   General: No tenderness. Normal range of motion.  ?   Cervical back: Normal range of motion.  ?Lymphadenopathy:  ?   Cervical: No cervical adenopathy.  ?Skin: ?   General: Skin is warm and dry.  ?   Findings: No rash.  ?Neurological:  ?   Mental Status: He is alert and oriented to person, place, and time.  ?   Cranial Nerves: No cranial nerve deficit.  ?   Motor: No abnormal muscle tone.  ?   Coordination: Coordination normal.  ?   Gait: Gait abnormal.  ?   Deep Tendon Reflexes: Reflexes are normal and symmetric.  ?Psychiatric:     ?   Behavior: Behavior normal.     ?   Thought Content: Thought content normal.     ?   Judgment: Judgment normal.  ?Eryth nasal passages ?Cane/pole ? ?Lab Results  ?Component Value Date  ? WBC 7.0  05/30/2021  ? HGB 14.5 05/30/2021  ? HCT 42.7 05/30/2021  ? PLT 211.0 05/30/2021  ? GLUCOSE 113 (H) 05/30/2021  ? CHOL 156 12/14/2020  ? TRIG 102.0 12/14/2020  ? HDL 52.00 12/14/2020  ? [place] 83 12/14/2020  ? ALT 30 05/30/2021  ? AST 22 05/30/2021  ?  NA 139 05/30/2021  ? K 4.5 05/30/2021  ? CL 102 05/30/2021  ? CREATININE 0.97 05/30/2021  ? BUN 8 05/30/2021  ? CO2 28 05/30/2021  ? TSH 1.00 12/14/2020  ? PSA 0.76 06/29/2016  ? INR 0.96 06/30/2013  ? HGBA1C 5.7 12/14/2020  ? ? ?DG Chest 2 View ? ?Result Date: 05/12/2021 ?CLINICAL DATA:  Presyncope EXAM: CHEST - 2 VIEW COMPARISON:  03/22/2018 FINDINGS: The lungs are symmetrically well expanded. No confluent pulmonary infiltrate. Tiny left pleural effusion. No pneumothorax. Cardiac size within normal limits. Pulmonary vascularity is normal. No acute bone abnormality peer IMPRESSION: Tiny left pleural effusion. Electronically Signed   By: Fidela Salisbury M.D.   On: 05/12/2021 21:04  ? ? ?Assessment & Plan:  ? ?Problem List Items Addressed This Visit   ? ? Allergic rhinitis  ?  Cont on Claritin and Flonase ?  ?  ? Myalgia  ?  F/u chronic pain - better on QID oxycodone ?Duragesic q 48 h ?Diazepam prn spasms ? Potential benefits of a long term benzodiazepines  use as well as potential risks  and complications were explained to the patient and were aknowledged. ?Narcan prescription provided ? Potential benefits of a long term opioids use as well as potential risks (i.e. addiction risk, apnea etc) and complications (i.e. Somnolence, constipation and others) were explained to the patient and were aknowledged. ? ?  ?  ? Low back pain  ?  F/u chronic pain - better on QID oxycodone ?Duragesic q 48 h ?Diazepam prn spasms ? Potential benefits of a long term benzodiazepines  use as well as potential risks  and complications were explained to the patient and were aknowledged. ?Narcan prescription provided ? Potential benefits of a long term opioids use as well as potential risks  (i.e. addiction risk, apnea etc) and complications (i.e. Somnolence, constipation and others) were explained to the patient and were aknowledged. ?  ?  ? Relevant Medications  ? fentaNYL (DURAGESIC) 25 MCG/HR  ? fentaNYL (

## 2021-06-28 NOTE — Assessment & Plan Note (Signed)
Start Omnicef x 2 weeks ?

## 2021-06-28 NOTE — Assessment & Plan Note (Signed)
Cont on Claritin and Flonase ?

## 2021-06-28 NOTE — Assessment & Plan Note (Signed)
F/u chronic pain - better on QID oxycodone ?Duragesic q 48 h ?Diazepam prn spasms ? Potential benefits of a long term benzodiazepines  use as well as potential risks  and complications were explained to the patient and were aknowledged. ?Narcan prescription provided ? Potential benefits of a long term opioids use as well as potential risks (i.e. addiction risk, apnea etc) and complications (i.e. Somnolence, constipation and others) were explained to the patient and were aknowledged. ? ?

## 2021-07-17 ENCOUNTER — Other Ambulatory Visit: Payer: Self-pay | Admitting: Internal Medicine

## 2021-07-22 ENCOUNTER — Other Ambulatory Visit: Payer: Self-pay | Admitting: Internal Medicine

## 2021-07-26 ENCOUNTER — Other Ambulatory Visit: Payer: Self-pay | Admitting: Internal Medicine

## 2021-07-26 NOTE — Telephone Encounter (Signed)
Pts spouse states pt only received 2 out of 3 boxes of fentaNYL (DURAGESIC) 25 MCG/HR due to shortage  ? ?Spouse is requesting a new rx for the remainder box of 5 patches  ?

## 2021-07-28 ENCOUNTER — Encounter: Payer: Self-pay | Admitting: Internal Medicine

## 2021-08-02 NOTE — Telephone Encounter (Signed)
Patient is down to last patch - it needs to be called in today to [facility] on [address] ?

## 2021-08-04 NOTE — Telephone Encounter (Signed)
Pt last filled Fenanyl patch Rx on 07/15/21 and the pharmacy only had 2 boxes in stock and pt is in need of a New Rx fentaNYL ([place]) 25 MCG/HR ?Medication for just 1 box to complete the full monthly supply. ? ?Pharmacy: ?[facility] [place] [address], [place] AT [address] [address] ? ? ?

## 2021-08-05 ENCOUNTER — Encounter: Payer: Self-pay | Admitting: Internal Medicine

## 2021-08-07 ENCOUNTER — Other Ambulatory Visit: Payer: Self-pay | Admitting: Internal Medicine

## 2021-08-07 MED ORDER — FENTANYL 25 MCG/HR TD PT72: 1.0000 | MEDICATED_PATCH | TRANSDERMAL | 0 refills | Status: DC

## 2021-08-08 ENCOUNTER — Encounter (HOSPITAL_BASED_OUTPATIENT_CLINIC_OR_DEPARTMENT_OTHER): Payer: Self-pay | Admitting: Cardiology

## 2021-08-08 ENCOUNTER — Ambulatory Visit (INDEPENDENT_AMBULATORY_CARE_PROVIDER_SITE_OTHER): Payer: Medicare Other | Admitting: Cardiology

## 2021-08-08 ENCOUNTER — Ambulatory Visit (HOSPITAL_BASED_OUTPATIENT_CLINIC_OR_DEPARTMENT_OTHER): Payer: Medicare Other | Admitting: Cardiology

## 2021-08-08 VITALS — BP 138/92 | HR 72 | Ht 73.0 in | Wt 246.0 lb

## 2021-08-08 DIAGNOSIS — Z7189 Other specified counseling: Secondary | ICD-10-CM | POA: Diagnosis not present

## 2021-08-08 DIAGNOSIS — I7121 Aneurysm of the ascending aorta, without rupture: Secondary | ICD-10-CM

## 2021-08-08 DIAGNOSIS — I10 Essential (primary) hypertension: Secondary | ICD-10-CM | POA: Diagnosis not present

## 2021-08-08 DIAGNOSIS — I2510 Atherosclerotic heart disease of native coronary artery without angina pectoris: Secondary | ICD-10-CM | POA: Diagnosis not present

## 2021-08-08 DIAGNOSIS — E7800 Pure hypercholesterolemia, unspecified: Secondary | ICD-10-CM | POA: Diagnosis not present

## 2021-08-08 NOTE — Patient Instructions (Signed)
Medication Instructions:  ?Your Physician recommend you continue on your current medication as directed.   ? ?*If you need a refill on your cardiac medications before your next appointment, please call your pharmacy* ? ? ?Lab Work: ?None ordered today ? ? ?Testing/Procedures: ?None ordered today ? ? ?Follow-Up: ?At [facility], you and your health needs are our priority.  As part of our continuing mission to provide you with exceptional heart care, we have created designated Provider Care Teams.  These Care Teams include your primary Cardiologist (physician) and Advanced Practice Providers (APPs -  Physician Assistants and Nurse Practitioners) who all work together to provide you with the care you need, when you need it. ? ?We recommend signing up for the patient portal called "MyChart".  Sign up information is provided on this After Visit Summary.  MyChart is used to connect with patients for Virtual Visits (Telemedicine).  Patients are able to view lab/test results, encounter notes, upcoming appointments, etc.  Non-urgent messages can be sent to your provider as well.   ?To learn more about what you can do with MyChart, go to [email or web address].   ? ?Your next appointment:   ?6 month(s) ? ?The format for your next appointment:   ?In Person ? ?Provider:   ?Bridgette Christopher, MD{ ? ? ?Important Information About Sugar ? ? ? ? ? ? ?

## 2021-08-08 NOTE — Progress Notes (Signed)
Cardiology Office Note:    Date:  08/08/2021   ID:  Lee Owens, DOB [birth year], MRN [id number]  PCP:  Cassandria Anger, MD  Cardiologist:  Buford Dresser, MD PhD  Referring MD: Cassandria Anger, MD   CC: follow up  History of Present Illness:    Lee Owens is a 71 y.o. male with a hx of hypertension who was initially seen via telemedicine visit 06/25/18. He was referred after calcium score was done by Dr. Alain Owens.   Cardiac history: he was started on rosuvastatin 10 mg in the past. He was changed back to lovastatin at his visit with Dr. Alain Owens 09/26/18. He states muscle pain was severe on rosuvastatin, returned to lovastatin. Now tolerating again without issue. Reviewed history, he is unsure of all of the statins he has tried (documentation of simvastatin, unsure about atorvastatin).  Has history of high blood pressure, was taken off BP meds due to lightheadedness.    Brother had heart attack in his early 68s. Reportedly has poor health habits.  Today: Brings a blood pressure log with him today from February-March. Highest 183/123, 77/59 lowest. Gradually improved, no symptoms. Most 120-130/70-80s. Does feel dizzy frequently, especially with rapid position changes. He knows to take his time but this is hard for him. Does note that stress/pain raise his BP.  Has been out and walking nearly every day, can go up to a mile. Walks about 25 minutes at a time.   Drinks beer daily. Tends to not eat until evening.  Denies chest pain, shortness of breath at rest or with normal exertion. No PND, orthopnea, LE edema or unexpected weight gain. No syncope or palpitations.    Past Medical History:  Diagnosis Date   Allergic rhinitis    Anxiety    takes Valium bid   Arthritis    "hips, knees, hands" (07/07/2013)   Back pain    occasionally but reason unknown   BPH (benign prostatic hypertrophy)    takes Proscar daily   Chronic fatigue    Complication of anesthesia     Per pt, "Hard to sedate" past last colonoscopy in 2008!   Depression    takes Cymbalta daily   Diverticulosis    Dizziness    due to dehydration   Hemorrhoids    Hyperlipidemia    takes Lovastatin daily   Hypertension    Insomnia    takes Restoril nightly    Joint pain    knees/ankles/back pain   Joint swelling    Muscle pain    takes Baclofen daily   Night muscle spasms    takes Valium as needed   Pneumonia 1971   Right knee DJD    Urinary frequency    takes rapaflo daily   Vitamin B12 deficiency 2009   Vitamin D deficiency disease     Past Surgical History:  Procedure Laterality Date   COLONOSCOPY     JOINT REPLACEMENT     both knees   KNEE ARTHROSCOPY Bilateral 2012   PROSTATE BIOPSY  01/2010   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   Procedure: TOTAL KNEE ARTHROPLASTY- left;  Surgeon: Lorn Junes, MD;  Location: [place];  Service: Orthopedics;  Laterality: Left;   TOTAL KNEE ARTHROPLASTY Right 07/07/2013   TOTAL KNEE ARTHROPLASTY Right 07/07/2013   Procedure: TOTAL KNEE ARTHROPLASTY- right;  Surgeon: Lorn Junes, MD;  Location: [facility];  Service: Orthopedics;  Laterality: Right;    Current Medications: Current Outpatient Medications on File Prior to  Visit  Medication Sig   allopurinol (ZYLOPRIM) 300 MG tablet TAKE 1 TABLET(300 MG) BY MOUTH DAILY   buPROPion (WELLBUTRIN XL) 150 MG 24 hr tablet TAKE 2 TABLETS(300 MG) BY MOUTH DAILY   Cholecalciferol (VITAMIN D-3) 5000 UNITS TABS Take 5,000 Units by mouth daily.   Coenzyme Q10 (COQ10) 400 MG CAPS 1 po qd   diazepam (VALIUM) 10 MG tablet TAKE 1/2 TO 1 TABLET(5 TO 10 MG) BY MOUTH EVERY 8 HOURS AS NEEDED FOR ANXIETY OR MUSCLE SPASMS   DULoxetine (CYMBALTA) 60 MG capsule TAKE 1 CAPSULE(60 MG) BY MOUTH TWICE DAILY   fentaNYL (DURAGESIC) 25 MCG/HR Place 1 patch onto the skin every other day.   finasteride (PROSCAR) 5 MG tablet Take 5 mg by mouth daily.   gabapentin (NEURONTIN) 300 MG capsule TAKE 1 CAPSULE(300 MG)  BY MOUTH THREE TIMES DAILY   losartan (COZAAR) 25 MG tablet Take 12.5 mg by mouth at bedtime.   lovastatin (MEVACOR) 40 MG tablet TAKE 1 TABLET(40 MG) BY MOUTH AT BEDTIME   NARCAN 4 MG/0.1ML LIQD nasal spray kit Place 0.1 sprays (0.4 mg total) into the nose as needed.   oxyCODONE (OXY IR/ROXICODONE) 5 MG immediate release tablet Take 2 tablets (10 mg total) by mouth every 6 (six) hours as needed for severe pain (chronic back pain).   tamsulosin (FLOMAX) 0.4 MG CAPS capsule Take 1 capsule by mouth daily.   temazepam (RESTORIL) 30 MG capsule TAKE 1 TO 2 CAPSULES BY MOUTH AT BEDTIME AS NEEDED FOR SLEEP   vitamin B-12 (CYANOCOBALAMIN) 1000 MCG tablet Take 2,000 mcg by mouth daily.   cefdinir (OMNICEF) 300 MG capsule Take 1 capsule (300 mg total) by mouth 2 (two) times daily. (Patient not taking: Reported on 08/08/2021)   fentaNYL (DURAGESIC) 25 MCG/HR Place 1 patch onto the skin every other day. (Patient not taking: Reported on 08/08/2021)   fentaNYL (DURAGESIC) 25 MCG/HR Place 1 patch onto the skin every other day. (Patient not taking: Reported on 08/08/2021)   fentaNYL (DURAGESIC) 25 MCG/HR Place 1 patch onto the skin every other day. (Patient not taking: Reported on 08/08/2021)   oxyCODONE (OXY IR/ROXICODONE) 5 MG immediate release tablet Take 2 tablets (10 mg total) by mouth every 6 (six) hours as needed for severe pain (chronic back pain). (Patient not taking: Reported on 08/08/2021)   oxyCODONE (OXY IR/ROXICODONE) 5 MG immediate release tablet Take 2 tablets (10 mg total) by mouth every 6 (six) hours as needed for severe pain (chronic back pain). (Patient not taking: Reported on 08/08/2021)   No current facility-administered medications on file prior to visit.     Allergies:   Crestor [rosuvastatin calcium]   Social History   Tobacco Use   Smoking status: Former    Packs/day: 1.00    Years: 38.00    Pack years: 38.00    Types: Cigarettes    Quit date: 10/23/2005    Years since quitting: 15.8    Smokeless tobacco: Never   Tobacco comments:    quit smoking 2008  Substance Use Topics   Alcohol use: Yes    Alcohol/week: 14.0 standard drinks    Types: 14 Cans of beer per week    Comment: 03/09/17 "2 beers daily"   Drug use: No    Comment: quit age 21    Family History: The patient's family history includes Atrial fibrillation in his father; Coronary artery disease in his father and another family member; Dementia in his sister; Heart failure in his father; Hypertension in  an other family member; Kidney disease in his father; Liver cancer in his mother.  ROS:   Please see the history of present illness.   Additional pertinent ROS negative except as documented  EKGs/Labs/Other Studies Reviewed:    The following studies were reviewed today:  CTA Chest/Aorta 04/27/2021: COMPARISON:  05/19/2020; 04/30/2019; coronary artery calcium scoring-05/21/2018   FINDINGS: Vascular Findings:   Stable fusiform aneurysmal dilatation of the ascending thoracic aorta with measurements as follows.   The thoracic aorta tapers to a normal caliber at the level of the aortic arch. There is a minimal amount of calcified atherosclerotic plaque involving the aortic arch, not resulting in a hemodynamically significant stenosis. The descending thoracic aorta is of normal caliber and widely patent without hemodynamically significant narrowing. No evidence of thoracic aortic dissection or perivascular stranding on this nongated examination   Conventional configuration of the aortic arch. The branch vessels of the aortic arch appear patent throughout their imaged courses.   Borderline cardiomegaly. Trace amount of pericardial fluid, presumably physiologic. Coronary artery calcifications.   Although this examination was not tailored for the evaluation the pulmonary arteries, there are no discrete filling defects within the central pulmonary arterial tree to suggest central pulmonary embolism.  Normal caliber of the main pulmonary artery.   There is narrowing of the left subclavian vein at the level of the thoracic inlet with associated mildly hypertrophied left chest wall and supraclavicular venous collaterals, potentially accentuated due to positioning of the patient's arms above his head, similar to the 04/2020 examination, of uncertain though doubtful clinical significance.   -------------------------------------------------------------   Thoracic aortic measurements:   SINOTUBULAR JUNCTION: 36 mm as measured in greatest oblique short axis coronal dimension.   PROXIMAL ASCENDING THORACIC AORTA: 43 mm as measured in greatest oblique short axis axial dimension at the level of the main pulmonary artery (axial image 45, series 8) and approximately 43 mm in greatest oblique short axis coronal diameter (coronal image 75, series 10), unchanged compared to the 04/2018 examination   AORTIC ARCH: 28 mm as measured in greatest oblique short axis sagittal dimension.   PROXIMAL DESCENDING THORACIC AORTA: 32 mm as measured in greatest oblique short axis axial dimension at the level of the main pulmonary artery.   DISTAL DESCENDING THORACIC AORTA: 27 mm as measured in greatest oblique short axis axial dimension at the level of the diaphragmatic hiatus.   Review of the MIP images confirms the above findings.   -------------------------------------------------------------   IMPRESSION: 1. Stable mild fusiform aneurysmal dilatation of the ascending thoracic aorta measuring 43 mm in diameter, unchanged compared to the 04/2018 examination. Recommend annual imaging followup by CTA or MRA. This recommendation follows 2010 ACCF/AHA/AATS/ACR/ASA/SCA/SCAI/SIR/STS/SVM Guidelines for the Diagnosis and Management of Patients with Thoracic Aortic Disease. Circulation. 2010; 121: M384-Y659. Aortic aneurysm NOS (ICD10-I71.9) 2. Coronary calcifications.  Aortic Atherosclerosis  (ICD10-I70.0).  Calcium Score 05/21/2018: FINDINGS: Non-cardiac: No significant non cardiac findings on limited lung and soft tissue windows. See separate report from [facility].   Ascending Aorta: Moderately dilated aortic root 4.3 cm   Pericardium: Normal   Coronary arteries: Calcium noted in LM and LAD   IMPRESSION: Coronary calcium score of 142. This was 60 th percentile for age and sex matched control   Moderately dilated aortic root 4.3 cm consider f/u CTA to further evaluate .  CT angio 04/30/19: Cardiovascular: The ascending thoracic aorta is aneurysmal measuring up to approximately 4.1 cm in diameter (axial series 4, image 66). There is no evidence for thoracic  aortic dissection or aneurysm. Minimal atherosclerotic changes are noted of the thoracic aorta. Mild coronary artery calcifications are noted. The heart size is normal. No acute pulmonary embolism was detected on this exam. There is no significant pericardial effusion.    EKG:  EKG is personally reviewed.   06/10/2021: EKG was not ordered. 05/02/2021: NSR at 78 bpm with nonspecific ST/T pattern 05/05/2020: NSR at 76 bpm with nonspecific ST/T pattern  Recent Labs: 12/14/2020: TSH 1.00 05/30/2021: ALT 30; BUN 8; Creatinine, Ser 0.97; Hemoglobin 14.5; Platelets 211.0; Potassium 4.5; Sodium 139   Recent Lipid Panel    Component Value Date/Time   CHOL 156 12/14/2020 1430   CHOL 165 05/05/2020 1651   TRIG 102.0 12/14/2020 1430   HDL 52.00 12/14/2020 1430   HDL 54 05/05/2020 1651   CHOLHDL 3 12/14/2020 1430   VLDL 20.4 12/14/2020 1430   LDLCALC 83 12/14/2020 1430   LDLCALC 95 05/05/2020 1651    Physical Exam:    VS:  BP (!) 138/92   Pulse 72   Ht $R'6\' 1"'yl$  (1.854 m)   Wt 246 lb (111.6 kg)   SpO2 97%   BMI 32.46 kg/m     Wt Readings from Last 3 Encounters:  08/08/21 246 lb (111.6 kg)  06/28/21 251 lb (113.9 kg)  06/10/21 247 lb 1.6 oz (112.1 kg)    GEN: Well nourished, well developed in no acute  distress HEENT: Normal, moist mucous membranes NECK: No JVD CARDIAC: regular rhythm, normal S1 and S2, no rubs or gallops. No murmur. VASCULAR: Radial and DP pulses 2+ bilaterally. No carotid bruits RESPIRATORY:  Clear to auscultation without rales, wheezing or rhonchi  ABDOMEN: Soft, non-tender, non-distended MUSCULOSKELETAL:  Ambulates independently SKIN: Warm and dry, trivial bilateral chronic venous insufficiency without significant edema NEUROLOGIC:  Alert and oriented x 3. No focal neuro deficits noted. PSYCHIATRIC:  Normal affect    ASSESSMENT:    1. Essential hypertension   2. 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 Coronary artery calcification seen on CT scan   4. Pure hypercholesterolemia   5. Cardiac risk counseling     PLAN:    Hypertension:  -reviewed goal of <130/80 given TAA -has labile Bps, both low/high. Exacerbated by pain/stress (runs high) and rapid position changes (runs low) -we reviewed pros/cons of treatment options: -beta blocker: struggling with depression. Will hold on this for now. Did discuss it is recommended given TAA -ARB: was on losartan 100 mg in the past, got lightheaded when he stood up. Tolerating 12.5 mg at night. -amlodipine: has chronic LE edema but is willing to try if needed  Coronary calcium seen on CT imaging: calcium score 129/55th percentile Hyperlipidemia: LDL goal <70 -consistent with nonobstructive CAD, asymptomatic -aspirin 81 mg daily, tolerating -did not tolerate rosuvastatin, simvastatin. Tolerating lovastatin and CoQ10 -LDL 83 on most recent lipids, not at goal but has not tolerated other statins -discussed CV risk management, below   Thoracic aortic ectasia/aneurysm -CT angio stable based on imaging above -discussed importance of good BP control  Cardiac risk counseling and prevention recommendations: We focused a lot on lifestyle today. His LDL is not at goal, but close. He would like to work on diet  and exercise first before adjusting medications further. -recommend heart healthy/Mediterranean diet, with whole grains, fruits, vegetable, fish, lean meats, nuts, and olive oil. Limit salt. -recommend moderate walking, 3-5 times/week for 30-50 minutes each session. Aim for at least 150 minutes.week. Goal should be pace of 3 miles/hours,  or walking 1.5 miles in 30 minutes -recommend avoidance of tobacco products. Avoid excess alcohol.  Plan for follow up: 6 months or sooner PRN  Medication Adjustments/Labs and Tests Ordered: Current medicines are reviewed at length with the patient today.  Concerns regarding medicines are outlined above.   No orders of the defined types were placed in this encounter.  No orders of the defined types were placed in this encounter.  Patient Instructions  Medication Instructions:  Your Physician recommend you continue on your current medication as directed.    *If you need a refill on your cardiac medications before your next appointment, please call your pharmacy*   Lab Work: None ordered today   Testing/Procedures: None ordered today   Follow-Up: At [facility], you and your health needs are our priority.  As part of our continuing mission to provide you with exceptional heart care, we have created designated Provider Care Teams.  These Care Teams include your primary Cardiologist (physician) and Advanced Practice Providers (APPs -  Physician Assistants and Nurse Practitioners) who all work together to provide you with the care you need, when you need it.  We recommend signing up for the patient portal called "MyChart".  Sign up information is provided on this After Visit Summary.  MyChart is used to connect with patients for Virtual Visits (Telemedicine).  Patients are able to view lab/test results, encounter notes, upcoming appointments, etc.  Non-urgent messages can be sent to your provider as well.   To learn more about what you can do with  MyChart, go to [email or web address].    Your next appointment:   6 month(s)  The format for your next appointment:   In Person  Provider:   Buford Dresser, MD{   Important Information About Sugar        Signed, Buford Dresser, MD PhD 08/08/2021  [place]

## 2021-08-21 ENCOUNTER — Encounter (HOSPITAL_BASED_OUTPATIENT_CLINIC_OR_DEPARTMENT_OTHER): Payer: Self-pay | Admitting: Cardiology

## 2021-10-09 ENCOUNTER — Encounter (INDEPENDENT_AMBULATORY_CARE_PROVIDER_SITE_OTHER): Payer: Self-pay

## 2021-10-10 ENCOUNTER — Encounter: Payer: Self-pay | Admitting: Internal Medicine

## 2021-10-10 ENCOUNTER — Ambulatory Visit (INDEPENDENT_AMBULATORY_CARE_PROVIDER_SITE_OTHER): Payer: Medicare Other | Admitting: Internal Medicine

## 2021-10-10 DIAGNOSIS — E538 Deficiency of other specified B group vitamins: Secondary | ICD-10-CM | POA: Diagnosis not present

## 2021-10-10 DIAGNOSIS — M791 Myalgia, unspecified site: Secondary | ICD-10-CM

## 2021-10-10 DIAGNOSIS — G8929 Other chronic pain: Secondary | ICD-10-CM

## 2021-10-10 DIAGNOSIS — I2510 Atherosclerotic heart disease of native coronary artery without angina pectoris: Secondary | ICD-10-CM

## 2021-10-10 DIAGNOSIS — F332 Major depressive disorder, recurrent severe without psychotic features: Secondary | ICD-10-CM | POA: Diagnosis not present

## 2021-10-10 DIAGNOSIS — M5442 Lumbago with sciatica, left side: Secondary | ICD-10-CM | POA: Diagnosis not present

## 2021-10-10 DIAGNOSIS — M5441 Lumbago with sciatica, right side: Secondary | ICD-10-CM | POA: Diagnosis not present

## 2021-10-10 DIAGNOSIS — Z9181 History of falling: Secondary | ICD-10-CM

## 2021-10-10 MED ORDER — ALLOPURINOL 300 MG PO TABS: ORAL_TABLET | ORAL | 3 refills | Status: DC

## 2021-10-10 MED ORDER — TEMAZEPAM 30 MG PO CAPS: ORAL_CAPSULE | ORAL | 1 refills | Status: DC

## 2021-10-10 MED ORDER — OXYCODONE HCL 5 MG PO TABS: 10.0000 mg | ORAL_TABLET | Freq: Four times a day (QID) | ORAL | 0 refills | Status: DC | PRN

## 2021-10-10 MED ORDER — FENTANYL 25 MCG/HR TD PT72: 1.0000 | MEDICATED_PATCH | TRANSDERMAL | 0 refills | Status: DC

## 2021-10-10 MED ORDER — DIAZEPAM 10 MG PO TABS: ORAL_TABLET | ORAL | 3 refills | Status: DC

## 2021-10-10 NOTE — Assessment & Plan Note (Addendum)
Cont on Duragesic, Oxy to 10 mg qid prn Narcane prn  Potential benefits of a long term opioids use as well as potential risks (i.e. addiction risk, apnea etc) and complications (i.e. Somnolence, constipation and others) were explained to the patient and were aknowledged

## 2021-10-10 NOTE — Assessment & Plan Note (Signed)
On B12/B complex

## 2021-10-10 NOTE — Assessment & Plan Note (Signed)
Start exercising - PT offered

## 2021-10-10 NOTE — Assessment & Plan Note (Signed)
Cont on Cymbalta and Wellbutrin

## 2021-10-10 NOTE — Patient Instructions (Signed)
Walk 5000 steps a day

## 2021-10-10 NOTE — Progress Notes (Signed)
Subjective:  Patient ID: Lee Owens, male    DOB: 1951-01-11  Age: 71 y.o. MRN: [id number]  CC: No chief complaint on file.   HPI Lee Owens presents for LBP, Myalgia, depression  Outpatient Medications Prior to Visit  Medication Sig Dispense Refill   buPROPion (WELLBUTRIN XL) 150 MG 24 hr tablet TAKE 2 TABLETS(300 MG) BY MOUTH DAILY 180 tablet 1   Cholecalciferol (VITAMIN D-3) 5000 UNITS TABS Take 5,000 Units by mouth daily.     Coenzyme Q10 (COQ10) 400 MG CAPS 1 po qd 100 capsule 3   DULoxetine (CYMBALTA) 60 MG capsule TAKE 1 CAPSULE(60 MG) BY MOUTH TWICE DAILY 60 capsule 5   finasteride (PROSCAR) 5 MG tablet Take 5 mg by mouth daily.     gabapentin (NEURONTIN) 300 MG capsule TAKE 1 CAPSULE(300 MG) BY MOUTH THREE TIMES DAILY 270 capsule 1   losartan (COZAAR) 25 MG tablet Take 12.5 mg by mouth at bedtime.     lovastatin (MEVACOR) 40 MG tablet TAKE 1 TABLET(40 MG) BY MOUTH AT BEDTIME 90 tablet 3   NARCAN 4 MG/0.1ML LIQD nasal spray kit Place 0.1 sprays (0.4 mg total) into the nose as needed. 2 each 3   tamsulosin (FLOMAX) 0.4 MG CAPS capsule Take 1 capsule by mouth daily.  2   vitamin B-12 (CYANOCOBALAMIN) 1000 MCG tablet Take 2,000 mcg by mouth daily.     allopurinol (ZYLOPRIM) 300 MG tablet TAKE 1 TABLET(300 MG) BY MOUTH DAILY 90 tablet 2   diazepam (VALIUM) 10 MG tablet TAKE 1/2 TO 1 TABLET(5 TO 10 MG) BY MOUTH EVERY 8 HOURS AS NEEDED FOR ANXIETY OR MUSCLE SPASMS 90 tablet 3   fentaNYL (DURAGESIC) 25 MCG/HR Place 1 patch onto the skin every other day. 15 patch 0   oxyCODONE (OXY IR/ROXICODONE) 5 MG immediate release tablet Take 2 tablets (10 mg total) by mouth every 6 (six) hours as needed for severe pain (chronic back pain). 240 tablet 0   temazepam (RESTORIL) 30 MG capsule TAKE 1 TO 2 CAPSULES BY MOUTH AT BEDTIME AS NEEDED FOR SLEEP 180 capsule 0   fentaNYL (DURAGESIC) 25 MCG/HR Place 1 patch onto the skin every other day. (Patient not taking: Reported on 08/08/2021) 5 patch 0    fentaNYL (DURAGESIC) 25 MCG/HR Place 1 patch onto the skin every other day. (Patient not taking: Reported on 08/08/2021) 15 patch 0   fentaNYL (DURAGESIC) 25 MCG/HR Place 1 patch onto the skin every other day. (Patient not taking: Reported on 08/08/2021) 15 patch 0   oxyCODONE (OXY IR/ROXICODONE) 5 MG immediate release tablet Take 2 tablets (10 mg total) by mouth every 6 (six) hours as needed for severe pain (chronic back pain). (Patient not taking: Reported on 08/08/2021) 240 tablet 0   oxyCODONE (OXY IR/ROXICODONE) 5 MG immediate release tablet Take 2 tablets (10 mg total) by mouth every 6 (six) hours as needed for severe pain (chronic back pain). (Patient not taking: Reported on 08/08/2021) 240 tablet 0   No facility-administered medications prior to visit.    ROS: Review of Systems  Constitutional:  Positive for fatigue and unexpected weight change. Negative for appetite change.  HENT:  Negative for congestion, nosebleeds, sneezing, sore throat and trouble swallowing.   Eyes:  Negative for itching and visual disturbance.  Respiratory:  Negative for cough.   Cardiovascular:  Negative for chest pain, palpitations and leg swelling.  Gastrointestinal:  Negative for abdominal distention, blood in stool, diarrhea and nausea.  Genitourinary:  Negative for frequency and  hematuria.  Musculoskeletal:  Positive for arthralgias, back pain, myalgias, neck pain and neck stiffness. Negative for gait problem and joint swelling.  Skin:  Negative for rash.  Neurological:  Negative for dizziness, tremors, speech difficulty and weakness.  Psychiatric/Behavioral:  Negative for agitation, dysphoric mood, sleep disturbance and suicidal ideas. The patient is not nervous/anxious.     Objective:  BP 128/80 (BP Location: Left Arm, Patient Position: Sitting, Cuff Size: Large)   Pulse 86   Temp 98.2 F (36.8 C) (Oral)   Ht 6' 1"  (1.854 m)   Wt 243 lb (110.2 kg)   SpO2 92%   BMI 32.06 kg/m   BP Readings from  Last 3 Encounters:  10/10/21 128/80  08/08/21 (!) 138/92  06/28/21 128/84    Wt Readings from Last 3 Encounters:  10/10/21 243 lb (110.2 kg)  08/08/21 246 lb (111.6 kg)  06/28/21 251 lb (113.9 kg)    Physical Exam Constitutional:      General: He is not in acute distress.    Appearance: He is well-developed. He is obese.     Comments: NAD  Eyes:     Conjunctiva/sclera: Conjunctivae normal.     Pupils: Pupils are equal, round, and reactive to light.  Neck:     Thyroid: No thyromegaly.     Vascular: No JVD.  Cardiovascular:     Rate and Rhythm: Normal rate and regular rhythm.     Heart sounds: Normal heart sounds. No murmur heard.    No friction rub. No gallop.  Pulmonary:     Effort: Pulmonary effort is normal. No respiratory distress.     Breath sounds: Normal breath sounds. No wheezing or rales.  Chest:     Chest wall: No tenderness.  Abdominal:     General: Bowel sounds are normal. There is no distension.     Palpations: Abdomen is soft. There is no mass.     Tenderness: There is no abdominal tenderness. There is no guarding or rebound.  Musculoskeletal:        General: Tenderness present. Normal range of motion.     Cervical back: Normal range of motion.  Lymphadenopathy:     Cervical: No cervical adenopathy.  Skin:    General: Skin is warm and dry.     Findings: No rash.  Neurological:     Mental Status: He is alert and oriented to person, place, and time.     Cranial Nerves: No cranial nerve deficit.     Motor: No abnormal muscle tone.     Coordination: Coordination abnormal.     Gait: Gait abnormal.     Deep Tendon Reflexes: Reflexes are normal and symmetric.  Psychiatric:        Behavior: Behavior normal.        Thought Content: Thought content normal.        Judgment: Judgment normal.     Lab Results  Component Value Date   WBC 7.0 05/30/2021   HGB 14.5 05/30/2021   HCT 42.7 05/30/2021   PLT 211.0 05/30/2021   GLUCOSE 113 (H) 05/30/2021   CHOL  156 12/14/2020   TRIG 102.0 12/14/2020   HDL 52.00 12/14/2020   LDLCALC 83 12/14/2020   ALT 30 05/30/2021   AST 22 05/30/2021   NA 139 05/30/2021   K 4.5 05/30/2021   CL 102 05/30/2021   CREATININE 0.97 05/30/2021   BUN 8 05/30/2021   CO2 28 05/30/2021   TSH 1.00 12/14/2020   PSA 0.76 06/29/2016  INR 0.96 06/30/2013   HGBA1C 5.7 12/14/2020    DG Chest 2 View  Result Date: 05/12/2021 CLINICAL DATA:  Presyncope EXAM: CHEST - 2 VIEW COMPARISON:  03/22/2018 FINDINGS: The lungs are symmetrically well expanded. No confluent pulmonary infiltrate. Tiny left pleural effusion. No pneumothorax. Cardiac size within normal limits. Pulmonary vascularity is normal. No acute bone abnormality peer IMPRESSION: Tiny left pleural effusion. Electronically Signed   By: Fidela Salisbury M.D.   On: 05/12/2021 21:04    Assessment & Plan:   Problem List Items Addressed This Visit     Depression    Cont on Cymbalta and Wellbutrin      Relevant Medications   diazepam (VALIUM) 10 MG tablet   Low back pain    Cont on Duragesic, Oxy to 10 mg qid prn Narcane prn  Potential benefits of a long term opioids use as well as potential risks (i.e. addiction risk, apnea etc) and complications (i.e. Somnolence, constipation and others) were explained to the patient and were aknowledged      Relevant Medications   fentaNYL (DURAGESIC) 25 MCG/HR   fentaNYL (DURAGESIC) 25 MCG/HR   fentaNYL (DURAGESIC) 25 MCG/HR   oxyCODONE (OXY IR/ROXICODONE) 5 MG immediate release tablet   oxyCODONE (OXY IR/ROXICODONE) 5 MG immediate release tablet   oxyCODONE (OXY IR/ROXICODONE) 5 MG immediate release tablet   Myalgia    Diazepam prn spasms  Potential benefits of a long term benzodiazepines  use as well as potential risks  and complications were explained to the patient and were aknowledged. Narcan prescription provided  Potential benefits of a long term opioids use as well as potential risks (i.e. addiction risk, apnea etc)  and complications (i.e. Somnolence, constipation and others) were explained to the patient and were aknowledged.      Vitamin B12 deficiency    On B12/B complex         Meds ordered this encounter  Medications   allopurinol (ZYLOPRIM) 300 MG tablet    Sig: TAKE 1 TABLET(300 MG) BY MOUTH DAILY    Dispense:  90 tablet    Refill:  3   fentaNYL (DURAGESIC) 25 MCG/HR    Sig: Place 1 patch onto the skin every other day.    Dispense:  15 patch    Refill:  0    Please fill on or after 12/12/21   fentaNYL (DURAGESIC) 25 MCG/HR    Sig: Place 1 patch onto the skin every other day.    Dispense:  15 patch    Refill:  0    Please fill on or after 11/12/21   fentaNYL (DURAGESIC) 25 MCG/HR    Sig: Place 1 patch onto the skin every other day.    Dispense:  15 patch    Refill:  0    Please fill on or after 10/13/21   oxyCODONE (OXY IR/ROXICODONE) 5 MG immediate release tablet    Sig: Take 2 tablets (10 mg total) by mouth every 6 (six) hours as needed for severe pain (chronic back pain).    Dispense:  240 tablet    Refill:  0    Please fill on or after 12/12/21   oxyCODONE (OXY IR/ROXICODONE) 5 MG immediate release tablet    Sig: Take 2 tablets (10 mg total) by mouth every 6 (six) hours as needed for severe pain (chronic back pain).    Dispense:  240 tablet    Refill:  0    Please fill on or after 11/12/21   oxyCODONE (  OXY IR/ROXICODONE) 5 MG immediate release tablet    Sig: Take 2 tablets (10 mg total) by mouth every 6 (six) hours as needed for severe pain (chronic back pain).    Dispense:  240 tablet    Refill:  0    Please fill on or after 10/13/21   diazepam (VALIUM) 10 MG tablet    Sig: TAKE 1/2 TO 1 TABLET(5 TO 10 MG) BY MOUTH EVERY 8 HOURS AS NEEDED FOR ANXIETY OR MUSCLE SPASMS    Dispense:  90 tablet    Refill:  3   temazepam (RESTORIL) 30 MG capsule    Sig: TAKE 1 TO 2 CAPSULES BY MOUTH AT BEDTIME AS NEEDED FOR SLEEP    Dispense:  180 capsule    Refill:  1     Using a  pole Ataxic, antalgic gate   Follow-up: Return in about 3 months (around 01/10/2022) for a follow-up visit.  Walker Kehr, MD

## 2021-10-10 NOTE — Assessment & Plan Note (Signed)
Diazepam prn spasms  Potential benefits of a long term benzodiazepines  use as well as potential risks  and complications were explained to the patient and were aknowledged. Narcan prescription provided  Potential benefits of a long term opioids use as well as potential risks (i.e. addiction risk, apnea etc) and complications (i.e. Somnolence, constipation and others) were explained to the patient and were aknowledged.

## 2021-10-20 ENCOUNTER — Encounter: Payer: Self-pay | Admitting: Gastroenterology

## 2021-10-22 ENCOUNTER — Other Ambulatory Visit: Payer: Self-pay | Admitting: Internal Medicine

## 2021-11-04 ENCOUNTER — Other Ambulatory Visit: Payer: Self-pay | Admitting: Internal Medicine

## 2021-11-14 ENCOUNTER — Encounter: Payer: Self-pay | Admitting: Internal Medicine

## 2021-11-15 ENCOUNTER — Encounter: Payer: Self-pay | Admitting: Internal Medicine

## 2021-11-16 ENCOUNTER — Other Ambulatory Visit: Payer: Self-pay | Admitting: Internal Medicine

## 2021-11-16 MED ORDER — OXYCODONE HCL 5 MG PO TABS: 10.0000 mg | ORAL_TABLET | Freq: Four times a day (QID) | ORAL | 0 refills | Status: DC | PRN

## 2021-11-17 ENCOUNTER — Other Ambulatory Visit: Payer: Self-pay | Admitting: Internal Medicine

## 2021-11-17 ENCOUNTER — Encounter: Payer: Self-pay | Admitting: Internal Medicine

## 2021-11-17 MED ORDER — OXYCODONE HCL 5 MG PO TABS
10.0000 mg | ORAL_TABLET | Freq: Four times a day (QID) | ORAL | 0 refills | Status: DC | PRN
Start: 2021-11-17 — End: 2022-01-09

## 2021-11-22 ENCOUNTER — Ambulatory Visit (INDEPENDENT_AMBULATORY_CARE_PROVIDER_SITE_OTHER): Payer: Medicare Other | Admitting: Podiatry

## 2021-11-22 DIAGNOSIS — L600 Ingrowing nail: Secondary | ICD-10-CM | POA: Diagnosis not present

## 2021-11-22 DIAGNOSIS — I2510 Atherosclerotic heart disease of native coronary artery without angina pectoris: Secondary | ICD-10-CM | POA: Diagnosis not present

## 2021-11-22 MED ORDER — NEOMYCIN-POLYMYXIN-HC 1 % OT SOLN: OTIC | 1 refills | Status: DC

## 2021-11-22 NOTE — Patient Instructions (Signed)

## 2021-11-23 NOTE — Progress Notes (Signed)
Presents today chief complaint of painful ingrown toenail hallux right.  Objective: Pulses are palpable digital hair growth is present capillary fill time is immediate.  Toenails demonstrate sharp incurvated nail margin.  Erythema and edema.  Assessment: Ingrown toenail.  Plan: Partial chemical matricectomy was performed to the right hallux tolerated procedure well.  Local anesthetic was administered.  He was given both oral and written home instructions for care and soaking of the toe as well as a prescription of Cortisporin Otic to be applied twice daily after soaking.  Follow-up with him in the near future for checkup.  He will then follow-up with Dr. Sharyon Cable for routine nail debridement.

## 2021-12-01 NOTE — Telephone Encounter (Signed)
NOTE NOT NEEDED ?

## 2021-12-06 ENCOUNTER — Ambulatory Visit (INDEPENDENT_AMBULATORY_CARE_PROVIDER_SITE_OTHER): Payer: Medicare Other | Admitting: Podiatry

## 2021-12-06 DIAGNOSIS — Z9889 Other specified postprocedural states: Secondary | ICD-10-CM

## 2021-12-06 DIAGNOSIS — L600 Ingrowing nail: Secondary | ICD-10-CM

## 2021-12-11 NOTE — Progress Notes (Signed)
Lee Owens presents today for follow-up of his matricectomy hallux right.  States that he feels so much better than it did prior to the procedure.  States that he continues his regular basis he has not had any excessive pain.  Objective: Vital signs are stable he is alert and oriented x3 there is some eschar present but mild erythema no cellulitis drainage or odor no tenderness on palpation.  Hallux right appears to be healing very nicely.  Assessment: Well-healing matrixectomy hallux right.  Plan: We will continue to soak Epsom salts and warm water every other day until there is no redness tenderness drainage.  He will then follow-up with Dr. Prudence Davidson for routine nail debridement.

## 2021-12-29 ENCOUNTER — Telehealth: Payer: Self-pay | Admitting: Internal Medicine

## 2021-12-29 ENCOUNTER — Other Ambulatory Visit: Payer: Self-pay | Admitting: Internal Medicine

## 2021-12-29 NOTE — Telephone Encounter (Signed)
OK Noted Thx

## 2021-12-29 NOTE — Telephone Encounter (Signed)
Patient's wife sated that Lee Owens is not willing to do an AWV. Please discuss with patient on his next appt.

## 2022-01-05 DIAGNOSIS — R3914 Feeling of incomplete bladder emptying: Secondary | ICD-10-CM | POA: Diagnosis not present

## 2022-01-05 DIAGNOSIS — N5201 Erectile dysfunction due to arterial insufficiency: Secondary | ICD-10-CM | POA: Diagnosis not present

## 2022-01-05 DIAGNOSIS — N401 Enlarged prostate with lower urinary tract symptoms: Secondary | ICD-10-CM | POA: Diagnosis not present

## 2022-01-09 ENCOUNTER — Ambulatory Visit (INDEPENDENT_AMBULATORY_CARE_PROVIDER_SITE_OTHER): Payer: Medicare Other | Admitting: Internal Medicine

## 2022-01-09 ENCOUNTER — Telehealth: Payer: Self-pay

## 2022-01-09 ENCOUNTER — Encounter: Payer: Self-pay | Admitting: Internal Medicine

## 2022-01-09 VITALS — BP 130/92 | HR 81 | Temp 98.0°F | Ht 73.0 in | Wt 244.0 lb

## 2022-01-09 DIAGNOSIS — F5104 Psychophysiologic insomnia: Secondary | ICD-10-CM

## 2022-01-09 DIAGNOSIS — E538 Deficiency of other specified B group vitamins: Secondary | ICD-10-CM | POA: Diagnosis not present

## 2022-01-09 DIAGNOSIS — E559 Vitamin D deficiency, unspecified: Secondary | ICD-10-CM | POA: Diagnosis not present

## 2022-01-09 DIAGNOSIS — M5441 Lumbago with sciatica, right side: Secondary | ICD-10-CM

## 2022-01-09 DIAGNOSIS — G8929 Other chronic pain: Secondary | ICD-10-CM | POA: Diagnosis not present

## 2022-01-09 DIAGNOSIS — I2510 Atherosclerotic heart disease of native coronary artery without angina pectoris: Secondary | ICD-10-CM

## 2022-01-09 DIAGNOSIS — Z23 Encounter for immunization: Secondary | ICD-10-CM

## 2022-01-09 DIAGNOSIS — M5442 Lumbago with sciatica, left side: Secondary | ICD-10-CM

## 2022-01-09 MED ORDER — OXYCODONE HCL 5 MG PO TABS
10.0000 mg | ORAL_TABLET | Freq: Four times a day (QID) | ORAL | 0 refills | Status: DC | PRN
Start: 2022-01-09 — End: 2022-03-15

## 2022-01-09 MED ORDER — FENTANYL 25 MCG/HR TD PT72: 1.0000 | MEDICATED_PATCH | TRANSDERMAL | 0 refills | Status: DC

## 2022-01-09 MED ORDER — OXYCODONE HCL 5 MG PO TABS: 10.0000 mg | ORAL_TABLET | Freq: Four times a day (QID) | ORAL | 0 refills | Status: DC | PRN

## 2022-01-09 NOTE — Telephone Encounter (Signed)
[facility] needs the prescription for fentanyl patches to be updated. Looks like one has 5 patches instead of 15.

## 2022-01-09 NOTE — Assessment & Plan Note (Signed)
Chronic. 

## 2022-01-09 NOTE — Assessment & Plan Note (Addendum)
Oxy 10 mg qid prn Fentanyl q 48 h Narcane prn  Potential benefits of a long term opioids use as well as potential risks (i.e. addiction risk, apnea etc) and complications (i.e. Somnolence, constipation and others) were explained to the patient and were aknowledged

## 2022-01-09 NOTE — Assessment & Plan Note (Signed)
Refractory - weekly Vit d 50000 iu

## 2022-01-09 NOTE — Progress Notes (Signed)
Subjective:  Patient ID: Lee Owens, male    DOB: [birth year]  Age: 71 y.o. MRN: [id number]  CC: Follow-up (3 month f/u- Flu shot)   HPI Dandrea Widdowson presents for chronic pain, allergies, insomnia  Outpatient Medications Prior to Visit  Medication Sig Dispense Refill   allopurinol (ZYLOPRIM) 300 MG tablet TAKE 1 TABLET(300 MG) BY MOUTH DAILY 90 tablet 3   buPROPion (WELLBUTRIN XL) 150 MG 24 hr tablet TAKE 2 TABLETS(300 MG) BY MOUTH DAILY 180 tablet 1   Cholecalciferol (VITAMIN D-3) 5000 UNITS TABS Take 5,000 Units by mouth daily.     Coenzyme Q10 (COQ10) 400 MG CAPS 1 po qd 100 capsule 3   diazepam (VALIUM) 10 MG tablet TAKE 1/2 TO 1 TABLET(5 TO 10 MG) BY MOUTH EVERY 8 HOURS AS NEEDED FOR ANXIETY OR MUSCLE SPASMS 90 tablet 3   DULoxetine (CYMBALTA) 60 MG capsule TAKE 1 CAPSULE(60 MG) BY MOUTH TWICE DAILY 60 capsule 5   finasteride (PROSCAR) 5 MG tablet Take 5 mg by mouth daily.     gabapentin (NEURONTIN) 300 MG capsule TAKE 1 CAPSULE(300 MG) BY MOUTH THREE TIMES DAILY 270 capsule 1   losartan (COZAAR) 25 MG tablet Take 12.5 mg by mouth at bedtime.     lovastatin (MEVACOR) 40 MG tablet TAKE 1 TABLET(40 MG) BY MOUTH AT BEDTIME 90 tablet 3   NARCAN 4 MG/0.1ML LIQD nasal spray kit Place 0.1 sprays (0.4 mg total) into the nose as needed. 2 each 3   NEOMYCIN-POLYMYXIN-HYDROCORTISONE (CORTISPORIN) 1 % SOLN OTIC solution Apply 1-2 drops to toe BID after soaking 10 mL 1   tamsulosin (FLOMAX) 0.4 MG CAPS capsule Take 1 capsule by mouth daily.  2   temazepam (RESTORIL) 30 MG capsule TAKE 1 TO 2 CAPSULES BY MOUTH AT BEDTIME AS NEEDED FOR SLEEP 180 capsule 1   vitamin B-12 (CYANOCOBALAMIN) 1000 MCG tablet Take 2,000 mcg by mouth daily.     fentaNYL (DURAGESIC) 25 MCG/HR Place 1 patch onto the skin every other day. 15 patch 0   oxyCODONE (OXY IR/ROXICODONE) 5 MG immediate release tablet Take 2 tablets (10 mg total) by mouth every 6 (six) hours as needed for severe pain (chronic back pain). 240  tablet 0   oxyCODONE (OXY IR/ROXICODONE) 5 MG immediate release tablet Take 2 tablets (10 mg total) by mouth every 6 (six) hours as needed for severe pain (chronic back pain). 240 tablet 0   oxyCODONE (OXY IR/ROXICODONE) 5 MG immediate release tablet Take 2 tablets (10 mg total) by mouth every 6 (six) hours as needed for severe pain (chronic back pain). 177 tablet 0   fentaNYL (DURAGESIC) 25 MCG/HR Place 1 patch onto the skin every other day. (Patient not taking: Reported on 01/09/2022) 5 patch 0   fentaNYL (DURAGESIC) 25 MCG/HR Place 1 patch onto the skin every other day. 15 patch 0   fentaNYL (DURAGESIC) 25 MCG/HR Place 1 patch onto the skin every other day. (Patient not taking: Reported on 01/09/2022) 15 patch 0   No facility-administered medications prior to visit.    ROS: Review of Systems  Constitutional:  Positive for fatigue. Negative for appetite change and unexpected weight change.  HENT:  Negative for congestion, nosebleeds, sneezing, sore throat and trouble swallowing.   Eyes:  Negative for itching and visual disturbance.  Respiratory:  Negative for cough.   Cardiovascular:  Negative for chest pain, palpitations and leg swelling.  Gastrointestinal:  Negative for abdominal distention, blood in stool, diarrhea and nausea.  Genitourinary:  Negative for frequency and hematuria.  Musculoskeletal:  Positive for arthralgias, back pain, gait problem and neck stiffness. Negative for joint swelling and neck pain.  Skin:  Negative for rash.  Neurological:  Negative for dizziness, tremors, speech difficulty and weakness.  Psychiatric/Behavioral:  Positive for decreased concentration and dysphoric mood. Negative for agitation, sleep disturbance and suicidal ideas. The patient is nervous/anxious.     Objective:  BP (!) 130/92 (BP Location: Left Arm)   Pulse 81   Temp 98 F (36.7 C) (Oral)   Ht _0  (1.854 m)   Wt 244 lb (110.7 kg)   SpO2 95%   BMI 32.19 kg/m   BP Readings from Last  3 Encounters:  01/09/22 (!) 130/92  10/10/21 128/80  08/08/21 (!) 138/92    Wt Readings from Last 3 Encounters:  01/09/22 244 lb (110.7 kg)  10/10/21 243 lb (110.2 kg)  08/08/21 246 lb (111.6 kg)    Physical Exam Constitutional:      General: He is not in acute distress.    Appearance: He is well-developed. He is obese.     Comments: NAD  Eyes:     Conjunctiva/sclera: Conjunctivae normal.     Pupils: Pupils are equal, round, and reactive to light.  Neck:     Thyroid: No thyromegaly.     Vascular: No JVD.  Cardiovascular:     Rate and Rhythm: Normal rate and regular rhythm.     Heart sounds: Normal heart sounds. No murmur heard.    No friction rub. No gallop.  Pulmonary:     Effort: Pulmonary effort is normal. No respiratory distress.     Breath sounds: Normal breath sounds. No wheezing or rales.  Chest:     Chest wall: No tenderness.  Abdominal:     General: Bowel sounds are normal. There is no distension.     Palpations: Abdomen is soft. There is no mass.     Tenderness: There is no abdominal tenderness. There is no guarding or rebound.  Musculoskeletal:        General: No tenderness. Normal range of motion.     Cervical back: Normal range of motion.  Lymphadenopathy:     Cervical: No cervical adenopathy.  Skin:    General: Skin is warm and dry.     Findings: No rash.  Neurological:     Mental Status: He is alert and oriented to person, place, and time.     Cranial Nerves: No cranial nerve deficit.     Motor: No abnormal muscle tone.     Coordination: Coordination normal.     Gait: Gait normal.     Deep Tendon Reflexes: Reflexes are normal and symmetric.  Psychiatric:        Behavior: Behavior normal.        Thought Content: Thought content normal.        Judgment: Judgment normal.     Lab Results  Component Value Date   WBC 7.0 05/30/2021   HGB 14.5 05/30/2021   HCT 42.7 05/30/2021   PLT 211.0 05/30/2021   GLUCOSE 113 (H) 05/30/2021   CHOL 156  12/14/2020   TRIG 102.0 12/14/2020   HDL 52.00 12/14/2020   LDLCALC 83 12/14/2020   ALT 30 05/30/2021   AST 22 05/30/2021   NA 139 05/30/2021   K 4.5 05/30/2021   CL 102 05/30/2021   CREATININE 0.97 05/30/2021   BUN 8 05/30/2021   CO2 28 05/30/2021   TSH 1.00 12/14/2020  PSA 0.76 06/29/2016   INR 0.96 06/30/2013   HGBA1C 5.7 12/14/2020    DG Chest 2 View  Result Date: 05/12/2021 CLINICAL DATA:  Presyncope EXAM: CHEST - 2 VIEW COMPARISON:  03/22/2018 FINDINGS: The lungs are symmetrically well expanded. No confluent pulmonary infiltrate. Tiny left pleural effusion. No pneumothorax. Cardiac size within normal limits. Pulmonary vascularity is normal. No acute bone abnormality peer IMPRESSION: Tiny left pleural effusion. Electronically Signed   By: Fidela Salisbury M.D.   On: 05/12/2021 21:04    Assessment & Plan:   Problem List Items Addressed This Visit     Insomnia    Chronic       Low back pain    Oxy 10 mg qid prn Fentanyl q 48 h Narcane prn  Potential benefits of a long term opioids use as well as potential risks (i.e. addiction risk, apnea etc) and complications (i.e. Somnolence, constipation and others) were explained to the patient and were aknowledged      Relevant Medications   oxyCODONE (OXY IR/ROXICODONE) 5 MG immediate release tablet   oxyCODONE (OXY IR/ROXICODONE) 5 MG immediate release tablet   oxyCODONE (OXY IR/ROXICODONE) 5 MG immediate release tablet   fentaNYL (DURAGESIC) 25 MCG/HR   fentaNYL (DURAGESIC) 25 MCG/HR   fentaNYL (DURAGESIC) 25 MCG/HR   Vitamin B12 deficiency    On B12/B complex      Vitamin D deficiency    Refractory - weekly Vit d 50000 iu      Other Visit Diagnoses     Needs flu shot    -  Primary   Relevant Orders   Flu Vaccine QUAD High Dose(Fluad) (Completed)         Meds ordered this encounter  Medications   oxyCODONE (OXY IR/ROXICODONE) 5 MG immediate release tablet    Sig: Take 2 tablets (10 mg total) by mouth every  6 (six) hours as needed for severe pain (chronic back pain).    Dispense:  240 tablet    Refill:  0    Please fill on or after 03/12/22   oxyCODONE (OXY IR/ROXICODONE) 5 MG immediate release tablet    Sig: Take 2 tablets (10 mg total) by mouth every 6 (six) hours as needed for severe pain (chronic back pain).    Dispense:  240 tablet    Refill:  0    Please fill on or after 02/10/22   oxyCODONE (OXY IR/ROXICODONE) 5 MG immediate release tablet    Sig: Take 2 tablets (10 mg total) by mouth every 6 (six) hours as needed for severe pain (chronic back pain).    Dispense:  240 tablet    Refill:  0    Please fill on or after 01/11/22   fentaNYL (DURAGESIC) 25 MCG/HR    Sig: Place 1 patch onto the skin every other day.    Dispense:  15 patch    Refill:  0    Please fill on or after 03/12/22   fentaNYL (DURAGESIC) 25 MCG/HR    Sig: Place 1 patch onto the skin every other day.    Dispense:  15 patch    Refill:  0    Please fill on or after 02/10/22   fentaNYL (DURAGESIC) 25 MCG/HR    Sig: Place 1 patch onto the skin every other day.    Dispense:  5 patch    Refill:  0    Please fill on or after 01/11/22      Follow-up: No follow-ups on file.  Walker Kehr, MD

## 2022-01-09 NOTE — Assessment & Plan Note (Signed)
On B12/B complex

## 2022-01-11 MED ORDER — FENTANYL 25 MCG/HR TD PT72: 1.0000 | MEDICATED_PATCH | TRANSDERMAL | 0 refills | Status: DC

## 2022-01-11 NOTE — Addendum Note (Signed)
Addended by: Cassandria Anger on: 01/11/2022 07:44 AM   Modules accepted: Orders

## 2022-01-11 NOTE — Telephone Encounter (Signed)
Done. Thank you.

## 2022-01-23 DIAGNOSIS — Z23 Encounter for immunization: Secondary | ICD-10-CM | POA: Diagnosis not present

## 2022-01-31 ENCOUNTER — Encounter: Payer: Self-pay | Admitting: Podiatry

## 2022-01-31 ENCOUNTER — Ambulatory Visit (INDEPENDENT_AMBULATORY_CARE_PROVIDER_SITE_OTHER): Payer: Medicare Other | Admitting: Podiatry

## 2022-01-31 DIAGNOSIS — M79675 Pain in left toe(s): Secondary | ICD-10-CM

## 2022-01-31 DIAGNOSIS — B351 Tinea unguium: Secondary | ICD-10-CM

## 2022-01-31 DIAGNOSIS — M79674 Pain in right toe(s): Secondary | ICD-10-CM | POA: Diagnosis not present

## 2022-01-31 NOTE — Progress Notes (Signed)
This patient presents to the office with chief complaint of long thick painful nails.  Patient says the nails are painful walking and wearing shoes.  This patient is unable to self treat.  This patient is unable to trim his  nails since she is unable to reach his nails.  She presents to the office for preventative foot care services.  General Appearance  Alert, conversant and in no acute stress.  Vascular  Dorsalis pedis and posterior tibial  pulses are palpable  bilaterally.  Capillary return is within normal limits  bilaterally. Temperature is within normal limits  bilaterally.  Neurologic  Senn-Weinstein monofilament wire test within normal limits  bilaterally. Muscle power within normal limits bilaterally.  Nails Thick disfigured discolored nails with subungual debris  hallux nails bilaterally. No evidence of bacterial infection or drainage bilaterally.  Orthopedic  No limitations of motion  feet .  No crepitus or effusions noted.  No bony pathology or digital deformities noted.Hallux malleus left.  Hammer toes 2-5  B/L.  Skin  normotropic skin with no porokeratosis noted bilaterally.  No signs of infections or ulcers noted.     Onychomycosis  Nails  B/L.  Pain in right toes  Pain in left toes  Debridement of nails both feet followed trimming the nails with dremel tool.    RTC 3 months.   Gardiner Barefoot DPM

## 2022-02-08 ENCOUNTER — Ambulatory Visit (INDEPENDENT_AMBULATORY_CARE_PROVIDER_SITE_OTHER): Payer: Medicare Other | Admitting: Cardiology

## 2022-02-08 ENCOUNTER — Encounter (HOSPITAL_BASED_OUTPATIENT_CLINIC_OR_DEPARTMENT_OTHER): Payer: Self-pay | Admitting: Cardiology

## 2022-02-08 VITALS — BP 128/78 | HR 76 | Ht 73.0 in | Wt 248.0 lb

## 2022-02-08 DIAGNOSIS — I2510 Atherosclerotic heart disease of native coronary artery without angina pectoris: Secondary | ICD-10-CM | POA: Diagnosis not present

## 2022-02-08 DIAGNOSIS — R0989 Other specified symptoms and signs involving the circulatory and respiratory systems: Secondary | ICD-10-CM | POA: Diagnosis not present

## 2022-02-08 DIAGNOSIS — I10 Essential (primary) hypertension: Secondary | ICD-10-CM | POA: Diagnosis not present

## 2022-02-08 DIAGNOSIS — I7121 Aneurysm of the ascending aorta, without rupture: Secondary | ICD-10-CM

## 2022-02-08 DIAGNOSIS — Z79899 Other long term (current) drug therapy: Secondary | ICD-10-CM | POA: Diagnosis not present

## 2022-02-08 NOTE — Progress Notes (Signed)
Cardiology Office Note:    Date:  02/08/2022   ID:  Lee Owens, DOB 07-18-1950, MRN [id number]  PCP:  Cassandria Anger, MD  Cardiologist:  Buford Dresser, MD PhD  Referring MD: Cassandria Anger, MD   CC: follow up  History of Present Illness:    Lee Owens is a 71 y.o. male with a hx of hypertension, thoracic aortic aneurysm who was initially seen via telemedicine visit 06/25/18. He was referred after calcium score was done by Dr. Alain Marion.   Cardiac history: he was started on rosuvastatin 10 mg in the past. He was changed back to lovastatin at his visit with Dr. Alain Marion 09/26/18. He states muscle pain was severe on rosuvastatin, returned to lovastatin. Now tolerating again without issue. Reviewed history, he is unsure of all of the statins he has tried (documentation of simvastatin, unsure about atorvastatin).  Has history of high blood pressure, was taken off BP meds previously due to lightheadedness.    Brother had heart attack in his early 58s. Reportedly has poor health habits.  At his last visit his at home blood pressures ranged from 77/59 to 183/123, but were mostly 120-130s/70-80s. He felt dizzy frequently, especially with rapid position changes. No medication changes were made.  Today, he is accompanied by his wife. He presents a blood pressure log today. We reviewed this; labile readings, ranging from 85/58 to 179/100. Overall he believes his blood pressure has improved since his last visit. At one time his BP was 200/100, but he notes he was extremely agitated that day. He also attributes his higher blood pressures to the fact that he is constantly in pain. There are also days with blood pressures under 716 systolic. On those days he does not take his antihypertensives. Sometimes when he gets up in the morning he is very dizzy.  Additionally he had an isolated episode of RUE pain of unknown etiology. Wife notes that he had an injury. Pain has since  resolved.  For exercise he may walk along his yard multiple times.  He denies any palpitations, chest pain, shortness of breath, or peripheral edema. No lightheadedness, headaches, syncope, orthopnea, or PND.    Past Medical History:  Diagnosis Date   Allergic rhinitis    Anxiety    takes Valium bid   Arthritis    "hips, knees, hands" (07/07/2013)   Back pain    occasionally but reason unknown   BPH (benign prostatic hypertrophy)    takes Proscar daily   Chronic fatigue    Complication of anesthesia    Per pt, "Hard to sedate" past last colonoscopy in 2008!   Depression    takes Cymbalta daily   Diverticulosis    Dizziness    due to dehydration   Hemorrhoids    Hyperlipidemia    takes Lovastatin daily   Hypertension    Insomnia    takes Restoril nightly    Joint pain    knees/ankles/back pain   Joint swelling    Muscle pain    takes Baclofen daily   Night muscle spasms    takes Valium as needed   Pneumonia 1971   Right knee DJD    Urinary frequency    takes rapaflo daily   Vitamin B12 deficiency 2009   Vitamin D deficiency disease     Past Surgical History:  Procedure Laterality Date   COLONOSCOPY     JOINT REPLACEMENT     both knees   KNEE ARTHROSCOPY Bilateral 2012  PROSTATE BIOPSY  01/2010   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   Procedure: TOTAL KNEE ARTHROPLASTY- left;  Surgeon: Lorn Junes, MD;  Location: [place];  Service: Orthopedics;  Laterality: Left;   TOTAL KNEE ARTHROPLASTY Right 07/07/2013   TOTAL KNEE ARTHROPLASTY Right 07/07/2013   Procedure: TOTAL KNEE ARTHROPLASTY- right;  Surgeon: Lorn Junes, MD;  Location: [place];  Service: Orthopedics;  Laterality: Right;    Current Medications: Current Outpatient Medications on File Prior to Visit  Medication Sig   allopurinol (ZYLOPRIM) 300 MG tablet TAKE 1 TABLET(300 MG) BY MOUTH DAILY   buPROPion (WELLBUTRIN XL) 150 MG 24 hr tablet TAKE 2 TABLETS(300 MG) BY MOUTH DAILY   Cholecalciferol  (VITAMIN D-3) 5000 UNITS TABS Take 5,000 Units by mouth daily.   Coenzyme Q10 (COQ10) 400 MG CAPS 1 po qd   diazepam (VALIUM) 10 MG tablet TAKE 1/2 TO 1 TABLET(5 TO 10 MG) BY MOUTH EVERY 8 HOURS AS NEEDED FOR ANXIETY OR MUSCLE SPASMS   DULoxetine (CYMBALTA) 60 MG capsule TAKE 1 CAPSULE(60 MG) BY MOUTH TWICE DAILY   fentaNYL (DURAGESIC) 25 MCG/HR Place 1 patch onto the skin every other day.   fentaNYL (DURAGESIC) 25 MCG/HR Place 1 patch onto the skin every other day.   fentaNYL (DURAGESIC) 25 MCG/HR Place 1 patch onto the skin every other day.   finasteride (PROSCAR) 5 MG tablet Take 5 mg by mouth daily.   gabapentin (NEURONTIN) 300 MG capsule TAKE 1 CAPSULE(300 MG) BY MOUTH THREE TIMES DAILY   losartan (COZAAR) 25 MG tablet Take 12.5 mg by mouth at bedtime.   lovastatin (MEVACOR) 40 MG tablet TAKE 1 TABLET(40 MG) BY MOUTH AT BEDTIME   NARCAN 4 MG/0.1ML LIQD nasal spray kit Place 0.1 sprays (0.4 mg total) into the nose as needed.   oxyCODONE (OXY IR/ROXICODONE) 5 MG immediate release tablet Take 2 tablets (10 mg total) by mouth every 6 (six) hours as needed for severe pain (chronic back pain).   oxyCODONE (OXY IR/ROXICODONE) 5 MG immediate release tablet Take 2 tablets (10 mg total) by mouth every 6 (six) hours as needed for severe pain (chronic back pain).   oxyCODONE (OXY IR/ROXICODONE) 5 MG immediate release tablet Take 2 tablets (10 mg total) by mouth every 6 (six) hours as needed for severe pain (chronic back pain).   tamsulosin (FLOMAX) 0.4 MG CAPS capsule Take 1 capsule by mouth daily.   temazepam (RESTORIL) 30 MG capsule TAKE 1 TO 2 CAPSULES BY MOUTH AT BEDTIME AS NEEDED FOR SLEEP   vitamin B-12 (CYANOCOBALAMIN) 1000 MCG tablet Take 2,000 mcg by mouth daily.   No current facility-administered medications on file prior to visit.     Allergies:   Crestor [rosuvastatin calcium]   Social History   Tobacco Use   Smoking status: Former    Packs/day: 1.00    Years: 38.00    Total pack  years: 38.00    Types: Cigarettes    Quit date: 10/23/2005    Years since quitting: 16.3   Smokeless tobacco: Never   Tobacco comments:    quit smoking 2008  Substance Use Topics   Alcohol use: Yes    Alcohol/week: 14.0 standard drinks of alcohol    Types: 14 Cans of beer per week    Comment: 03/09/17 "2 beers daily"   Drug use: No    Comment: quit age 74    Family History: The patient's family history includes Atrial fibrillation in his father; Coronary artery disease in his  father and another family member; Dementia in his sister; Heart failure in his father; Hypertension in an other family member; Kidney disease in his father; Liver cancer in his mother.  ROS:   Please see the history of present illness.   (+) Myalgias (+) Dizziness Additional pertinent ROS negative except as documented  EKGs/Labs/Other Studies Reviewed:    The following studies were reviewed today:  CTA Chest/Aorta 04/27/2021: COMPARISON:  05/19/2020; 04/30/2019; coronary artery calcium scoring-05/21/2018   FINDINGS: Vascular Findings:   Stable fusiform aneurysmal dilatation of the ascending thoracic aorta with measurements as follows.   The thoracic aorta tapers to a normal caliber at the level of the aortic arch. There is a minimal amount of calcified atherosclerotic plaque involving the aortic arch, not resulting in a hemodynamically significant stenosis. The descending thoracic aorta is of normal caliber and widely patent without hemodynamically significant narrowing. No evidence of thoracic aortic dissection or perivascular stranding on this nongated examination   Conventional configuration of the aortic arch. The branch vessels of the aortic arch appear patent throughout their imaged courses.   Borderline cardiomegaly. Trace amount of pericardial fluid, presumably physiologic. Coronary artery calcifications.   Although this examination was not tailored for the evaluation the pulmonary  arteries, there are no discrete filling defects within the central pulmonary arterial tree to suggest central pulmonary embolism. Normal caliber of the main pulmonary artery.   There is narrowing of the left subclavian vein at the level of the thoracic inlet with associated mildly hypertrophied left chest wall and supraclavicular venous collaterals, potentially accentuated due to positioning of the patient's arms above his head, similar to the 04/2020 examination, of uncertain though doubtful clinical significance.   -------------------------------------------------------------   Thoracic aortic measurements:   SINOTUBULAR JUNCTION: 36 mm as measured in greatest oblique short axis coronal dimension.   PROXIMAL ASCENDING THORACIC AORTA: 43 mm as measured in greatest oblique short axis axial dimension at the level of the main pulmonary artery (axial image 45, series 8) and approximately 43 mm in greatest oblique short axis coronal diameter (coronal image 75, series 10), unchanged compared to the 04/2018 examination   AORTIC ARCH: 28 mm as measured in greatest oblique short axis sagittal dimension.   PROXIMAL DESCENDING THORACIC AORTA: 32 mm as measured in greatest oblique short axis axial dimension at the level of the main pulmonary artery.   DISTAL DESCENDING THORACIC AORTA: 27 mm as measured in greatest oblique short axis axial dimension at the level of the diaphragmatic hiatus.   Review of the MIP images confirms the above findings.   -------------------------------------------------------------   IMPRESSION: 1. Stable mild fusiform aneurysmal dilatation of the ascending thoracic aorta measuring 43 mm in diameter, unchanged compared to the 04/2018 examination. Recommend annual imaging followup by CTA or MRA. This recommendation follows 2010 ACCF/AHA/AATS/ACR/ASA/SCA/SCAI/SIR/STS/SVM Guidelines for the Diagnosis and Management of Patients with Thoracic Aortic  Disease. Circulation. 2010; 121: Y659-D357. Aortic aneurysm NOS (ICD10-I71.9) 2. Coronary calcifications.  Aortic Atherosclerosis (ICD10-I70.0).  Calcium Score 05/21/2018: FINDINGS: Non-cardiac: No significant non cardiac findings on limited lung and soft tissue windows. See separate report from [facility].   Ascending Aorta: Moderately dilated aortic root 4.3 cm   Pericardium: Normal   Coronary arteries: Calcium noted in LM and LAD   IMPRESSION: Coronary calcium score of 142. This was 46 th percentile for age and sex matched control   Moderately dilated aortic root 4.3 cm consider f/u CTA to further evaluate .  CT angio 04/30/19: Cardiovascular: The ascending thoracic aorta is  aneurysmal measuring up to approximately 4.1 cm in diameter (axial series 4, image 66). There is no evidence for thoracic aortic dissection or aneurysm. Minimal atherosclerotic changes are noted of the thoracic aorta. Mild coronary artery calcifications are noted. The heart size is normal. No acute pulmonary embolism was detected on this exam. There is no significant pericardial effusion.    EKG:  EKG is personally reviewed.   02/08/2022:  NSR at 76 bpm 06/10/2021: EKG was not ordered. 05/02/2021: NSR at 78 bpm with nonspecific ST/T pattern 05/05/2020: NSR at 76 bpm with nonspecific ST/T pattern  Recent Labs: 05/30/2021: ALT 30; BUN 8; Creatinine, Ser 0.97; Hemoglobin 14.5; Platelets 211.0; Potassium 4.5; Sodium 139   Recent Lipid Panel    Component Value Date/Time   CHOL 156 12/14/2020 1430   CHOL 165 05/05/2020 1651   TRIG 102.0 12/14/2020 1430   HDL 52.00 12/14/2020 1430   HDL 54 05/05/2020 1651   CHOLHDL 3 12/14/2020 1430   VLDL 20.4 12/14/2020 1430   LDLCALC 83 12/14/2020 1430   LDLCALC 95 05/05/2020 1651    Physical Exam:    VS:  BP 128/78   Pulse 76   Ht _0  (1.854 m)   Wt 248 lb (112.5 kg)   BMI 32.72 kg/m     Wt Readings from Last 3 Encounters:  02/08/22 248 lb (112.5  kg)  01/09/22 244 lb (110.7 kg)  10/10/21 243 lb (110.2 kg)    GEN: Well nourished, well developed in no acute distress HEENT: Normal, moist mucous membranes NECK: No JVD CARDIAC: regular rhythm, normal S1 and S2, no rubs or gallops. No murmur. VASCULAR: Radial and DP pulses 2+ bilaterally. No carotid bruits RESPIRATORY:  Clear to auscultation without rales, wheezing or rhonchi  ABDOMEN: Soft, non-tender, non-distended MUSCULOSKELETAL:  Ambulates independently SKIN: Warm and dry, trivial bilateral chronic venous insufficiency without significant edema NEUROLOGIC:  Alert and oriented x 3. No focal neuro deficits noted. PSYCHIATRIC:  Normal affect    ASSESSMENT:    1. Labile blood pressure   2. Aneurysm of ascending aorta without rupture ([place])   3. Medication management   4. Essential hypertension    PLAN:    Hypertension: very labile blood pressures -reviewed goal of <130/80 given TAA -has labile Bps, both low/high. Exacerbated by pain/stress (runs high) and rapid position changes (runs low) -we reviewed pros/cons of treatment options: -beta blocker: struggling with depression. Will hold on this for now. Did discuss it is recommended given TAA -ARB: was on losartan 100 mg in the past, got lightheaded when he stood up. Tolerating 12.5 mg at night. -amlodipine: has chronic LE edema but is willing to try if needed Overall trend is improved, still has some high numbers but also with lows. No changes today.  Coronary calcium seen on CT imaging: calcium score 129/55th percentile Hyperlipidemia: LDL goal <70 -consistent with nonobstructive CAD, asymptomatic -aspirin 81 mg daily, tolerating -did not tolerate rosuvastatin, simvastatin. Tolerating lovastatin and CoQ10 -LDL 83 on most recent lipids, not at goal but has not tolerated other statins -discussed CV risk management, below   Thoracic aortic ectasia/aneurysm -CT angio stable based on imaging above. Due for annual imaging in  February, ordered, with BMET prior -discussed importance of good BP control  Cardiac risk counseling and prevention recommendations: We focused a lot on lifestyle today. His LDL is not at goal, but close. He would like to work on diet and exercise first before adjusting medications further. -recommend heart healthy/Mediterranean diet, with whole grains,  fruits, vegetable, fish, lean meats, nuts, and olive oil. Limit salt. -recommend moderate walking, 3-5 times/week for 30-50 minutes each session. Aim for at least 150 minutes.week. Goal should be pace of 3 miles/hours, or walking 1.5 miles in 30 minutes -recommend avoidance of tobacco products. Avoid excess alcohol.  Plan for follow up: 1 year or sooner as needed.  Medication Adjustments/Labs and Tests Ordered: Current medicines are reviewed at length with the patient today.  Concerns regarding medicines are outlined above.   Orders Placed This Encounter  Procedures   CT ANGIO CHEST AORTA W/CM & OR WO/CM   Basic Metabolic Panel (BMET)   EKG 12-Lead   No orders of the defined types were placed in this encounter.  Patient Instructions  Medication Instructions:  Continue current medications  *If you need a refill on your cardiac medications before your next appointment, please call your pharmacy*   Lab Work: [facility] in February 2024  If you have labs (blood work) drawn today and your tests are completely normal, you will receive your results only by: [place] (if you have MyChart) OR A paper copy in the mail If you have any lab test that is abnormal or we need to change your treatment, we will call you to review the results.   Testing/Procedures: Cardiac CT Angiography (CTA) of the chest in February 2024, is a special type of CT scan that uses a computer to produce multi-dimensional views of major blood vessels throughout the body. In CT angiography, a contrast material is injected through an IV to help visualize the blood  vessels   Follow-Up: At [facility], you and your health needs are our priority.  As part of our continuing mission to provide you with exceptional heart care, we have created designated Provider Care Teams.  These Care Teams include your primary Cardiologist (physician) and Advanced Practice Providers (APPs -  Physician Assistants and Nurse Practitioners) who all work together to provide you with the care you need, when you need it.  We recommend signing up for the patient portal called "MyChart".  Sign up information is provided on this After Visit Summary.  MyChart is used to connect with patients for Virtual Visits (Telemedicine).  Patients are able to view lab/test results, encounter notes, upcoming appointments, etc.  Non-urgent messages can be sent to your provider as well.   To learn more about what you can do with MyChart, go to [email or web address].    Your next appointment:   1 year(s)  The format for your next appointment:   In Person  Provider:   Buford Dresser, MD    Other Instructions           I,Mitra Faeizi,acting as a scribe for Buford Dresser, MD.,have documented all relevant documentation on the behalf of Buford Dresser, MD,as directed by  Buford Dresser, MD while in the presence of Buford Dresser, MD.  I, Buford Dresser, MD, have reviewed all documentation for this visit. The documentation on 02/08/22 for the exam, diagnosis, procedures, and orders are all accurate and complete.   Signed, Buford Dresser, MD PhD 02/08/2022  [place]

## 2022-02-08 NOTE — Patient Instructions (Addendum)
Medication Instructions:  Continue current medications  *If you need a refill on your cardiac medications before your next appointment, please call your pharmacy*   Lab Work: [facility] in February 2024  If you have labs (blood work) drawn today and your tests are completely normal, you will receive your results only by: [place] (if you have MyChart) OR A paper copy in the mail If you have any lab test that is abnormal or we need to change your treatment, we will call you to review the results.   Testing/Procedures: Cardiac CT Angiography (CTA) of the chest in February 2024, is a special type of CT scan that uses a computer to produce multi-dimensional views of major blood vessels throughout the body. In CT angiography, a contrast material is injected through an IV to help visualize the blood vessels   Follow-Up: At [facility], you and your health needs are our priority.  As part of our continuing mission to provide you with exceptional heart care, we have created designated Provider Care Teams.  These Care Teams include your primary Cardiologist (physician) and Advanced Practice Providers (APPs -  Physician Assistants and Nurse Practitioners) who all work together to provide you with the care you need, when you need it.  We recommend signing up for the patient portal called "MyChart".  Sign up information is provided on this After Visit Summary.  MyChart is used to connect with patients for Virtual Visits (Telemedicine).  Patients are able to view lab/test results, encounter notes, upcoming appointments, etc.  Non-urgent messages can be sent to your provider as well.   To learn more about what you can do with MyChart, go to [email or web address].    Your next appointment:   1 year(s)  The format for your next appointment:   In Person  Provider:   Buford Dresser, MD    Other Instructions

## 2022-03-14 ENCOUNTER — Telehealth: Payer: Self-pay | Admitting: *Deleted

## 2022-03-14 NOTE — Telephone Encounter (Signed)
Rec;d msg from pharmacy stating Oxycodone 5 mg in short supply. Patient could not wait until we received more so he picked up #80 pills, and will need a new script for the additional 160 for this month.Marland KitchenJohny Chess

## 2022-03-15 MED ORDER — OXYCODONE HCL 5 MG PO TABS: 10.0000 mg | ORAL_TABLET | Freq: Four times a day (QID) | ORAL | 0 refills | Status: DC | PRN

## 2022-03-15 NOTE — Telephone Encounter (Signed)
Okay.  Thanks.

## 2022-04-05 ENCOUNTER — Ambulatory Visit (INDEPENDENT_AMBULATORY_CARE_PROVIDER_SITE_OTHER): Payer: Medicare Other | Admitting: Internal Medicine

## 2022-04-05 ENCOUNTER — Encounter: Payer: Self-pay | Admitting: Internal Medicine

## 2022-04-05 ENCOUNTER — Ambulatory Visit: Payer: Medicare Other

## 2022-04-05 VITALS — BP 120/78 | HR 77 | Temp 98.3°F | Ht 73.0 in | Wt 248.0 lb

## 2022-04-05 DIAGNOSIS — F332 Major depressive disorder, recurrent severe without psychotic features: Secondary | ICD-10-CM | POA: Diagnosis not present

## 2022-04-05 DIAGNOSIS — M5441 Lumbago with sciatica, right side: Secondary | ICD-10-CM | POA: Diagnosis not present

## 2022-04-05 DIAGNOSIS — U071 COVID-19: Secondary | ICD-10-CM

## 2022-04-05 DIAGNOSIS — G8929 Other chronic pain: Secondary | ICD-10-CM | POA: Diagnosis not present

## 2022-04-05 DIAGNOSIS — M5442 Lumbago with sciatica, left side: Secondary | ICD-10-CM | POA: Diagnosis not present

## 2022-04-05 DIAGNOSIS — F419 Anxiety disorder, unspecified: Secondary | ICD-10-CM

## 2022-04-05 DIAGNOSIS — I10 Essential (primary) hypertension: Secondary | ICD-10-CM | POA: Diagnosis not present

## 2022-04-05 DIAGNOSIS — M791 Myalgia, unspecified site: Secondary | ICD-10-CM

## 2022-04-05 MED ORDER — OXYCODONE HCL 5 MG PO TABS: 10.0000 mg | ORAL_TABLET | Freq: Four times a day (QID) | ORAL | 0 refills | Status: DC | PRN

## 2022-04-05 MED ORDER — FENTANYL 25 MCG/HR TD PT72: 1.0000 | MEDICATED_PATCH | TRANSDERMAL | 0 refills | Status: DC

## 2022-04-05 MED ORDER — AMOXICILLIN-POT CLAVULANATE 875-125 MG PO TABS: 1.0000 | ORAL_TABLET | Freq: Two times a day (BID) | ORAL | 0 refills | Status: DC

## 2022-04-05 NOTE — Assessment & Plan Note (Signed)
Continue on: Oxy 10 mg qid prn Fentanyl q 48 h Narcane prn  Potential benefits of a long term opioids use as well as potential risks (i.e. addiction risk, apnea etc) and complications (i.e. Somnolence, constipation and others) were explained to the patient and were aknowledged

## 2022-04-05 NOTE — Assessment & Plan Note (Signed)
Diazepam prn  Potential benefits of a long term benzodiazepines  use as well as potential risks  and complications were explained to the patient and were aknowledged. 

## 2022-04-05 NOTE — Assessment & Plan Note (Signed)
He was on 50 mg Losartan prn

## 2022-04-05 NOTE — Progress Notes (Signed)
Subjective:  Patient ID: Lee Owens, male    DOB: Dec 28, 1950  Age: 72 y.o. MRN: [id number]  CC: No chief complaint on file.   HPI Lee Owens presents for chronic pain, depression C/o recent COVID19 - 03/27/22 C/o sinus pain/HA - worse   Outpatient Medications Prior to Visit  Medication Sig Dispense Refill   allopurinol (ZYLOPRIM) 300 MG tablet TAKE 1 TABLET(300 MG) BY MOUTH DAILY 90 tablet 3   buPROPion (WELLBUTRIN XL) 150 MG 24 hr tablet TAKE 2 TABLETS(300 MG) BY MOUTH DAILY 180 tablet 1   Cholecalciferol (VITAMIN D-3) 5000 UNITS TABS Take 5,000 Units by mouth daily.     Coenzyme Q10 (COQ10) 400 MG CAPS 1 po qd 100 capsule 3   diazepam (VALIUM) 10 MG tablet TAKE 1/2 TO 1 TABLET(5 TO 10 MG) BY MOUTH EVERY 8 HOURS AS NEEDED FOR ANXIETY OR MUSCLE SPASMS 90 tablet 3   DULoxetine (CYMBALTA) 60 MG capsule TAKE 1 CAPSULE(60 MG) BY MOUTH TWICE DAILY 60 capsule 5   finasteride (PROSCAR) 5 MG tablet Take 5 mg by mouth daily.     gabapentin (NEURONTIN) 300 MG capsule TAKE 1 CAPSULE(300 MG) BY MOUTH THREE TIMES DAILY 270 capsule 1   losartan (COZAAR) 25 MG tablet Take 12.5 mg by mouth at bedtime.     lovastatin (MEVACOR) 40 MG tablet TAKE 1 TABLET(40 MG) BY MOUTH AT BEDTIME 90 tablet 3   NARCAN 4 MG/0.1ML LIQD nasal spray kit Place 0.1 sprays (0.4 mg total) into the nose as needed. 2 each 3   tamsulosin (FLOMAX) 0.4 MG CAPS capsule Take 1 capsule by mouth daily.  2   temazepam (RESTORIL) 30 MG capsule TAKE 1 TO 2 CAPSULES BY MOUTH AT BEDTIME AS NEEDED FOR SLEEP 180 capsule 1   vitamin B-12 (CYANOCOBALAMIN) 1000 MCG tablet Take 2,000 mcg by mouth daily.     fentaNYL (DURAGESIC) 25 MCG/HR Place 1 patch onto the skin every other day. 15 patch 0   fentaNYL (DURAGESIC) 25 MCG/HR Place 1 patch onto the skin every other day. 15 patch 0   fentaNYL (DURAGESIC) 25 MCG/HR Place 1 patch onto the skin every other day. 15 patch 0   oxyCODONE (OXY IR/ROXICODONE) 5 MG immediate release tablet Take 2  tablets (10 mg total) by mouth every 6 (six) hours as needed for severe pain (chronic back pain). 240 tablet 0   oxyCODONE (OXY IR/ROXICODONE) 5 MG immediate release tablet Take 2 tablets (10 mg total) by mouth every 6 (six) hours as needed for severe pain (chronic back pain). 240 tablet 0   oxyCODONE (OXY IR/ROXICODONE) 5 MG immediate release tablet Take 2 tablets (10 mg total) by mouth every 6 (six) hours as needed for severe pain (chronic back pain). 160 tablet 0   No facility-administered medications prior to visit.    ROS: Review of Systems  Constitutional:  Negative for appetite change, fatigue and unexpected weight change.  HENT:  Negative for congestion, nosebleeds, sneezing, sore throat and trouble swallowing.   Eyes:  Negative for itching and visual disturbance.  Respiratory:  Negative for cough.   Cardiovascular:  Negative for chest pain, palpitations and leg swelling.  Gastrointestinal:  Negative for abdominal distention, blood in stool, diarrhea and nausea.  Genitourinary:  Negative for frequency and hematuria.  Musculoskeletal:  Positive for arthralgias, back pain and gait problem. Negative for joint swelling and neck pain.  Skin:  Negative for rash.  Neurological:  Negative for dizziness, tremors, speech difficulty and weakness.  Psychiatric/Behavioral:  Positive for dysphoric mood. Negative for agitation, sleep disturbance and suicidal ideas. The patient is nervous/anxious.     Objective:  BP 120/78 (BP Location: Left Arm, Patient Position: Sitting, Cuff Size: Large)   Pulse 77   Temp 98.3 F (36.8 C) (Oral)   Ht '6\' 1"'$  (1.854 m)   Wt 248 lb (112.5 kg)   SpO2 95%   BMI 32.72 kg/m   BP Readings from Last 3 Encounters:  04/05/22 120/78  02/08/22 128/78  01/09/22 (!) 130/92    Wt Readings from Last 3 Encounters:  04/05/22 248 lb (112.5 kg)  02/08/22 248 lb (112.5 kg)  01/09/22 244 lb (110.7 kg)    Physical Exam Constitutional:      General: He is not in  acute distress.    Appearance: He is well-developed. He is obese.     Comments: NAD  Eyes:     Conjunctiva/sclera: Conjunctivae normal.     Pupils: Pupils are equal, round, and reactive to light.  Neck:     Thyroid: No thyromegaly.     Vascular: No JVD.  Cardiovascular:     Rate and Rhythm: Normal rate and regular rhythm.     Heart sounds: Normal heart sounds. No murmur heard.    No friction rub. No gallop.  Pulmonary:     Effort: Pulmonary effort is normal. No respiratory distress.     Breath sounds: Normal breath sounds. No wheezing or rales.  Chest:     Chest wall: No tenderness.  Abdominal:     General: Bowel sounds are normal. There is no distension.     Palpations: Abdomen is soft. There is no mass.     Tenderness: There is no abdominal tenderness. There is no guarding or rebound.  Musculoskeletal:        General: No tenderness. Normal range of motion.     Cervical back: Normal range of motion.  Lymphadenopathy:     Cervical: No cervical adenopathy.  Skin:    General: Skin is warm and dry.     Findings: No rash.  Neurological:     Mental Status: He is alert and oriented to person, place, and time.     Cranial Nerves: No cranial nerve deficit.     Motor: No abnormal muscle tone.     Coordination: Coordination normal.     Gait: Gait abnormal.     Deep Tendon Reflexes: Reflexes are normal and symmetric.  Psychiatric:        Behavior: Behavior normal.        Thought Content: Thought content normal.        Judgment: Judgment normal.   Antalgic gait Using a pole  Lab Results  Component Value Date   WBC 7.0 05/30/2021   HGB 14.5 05/30/2021   HCT 42.7 05/30/2021   PLT 211.0 05/30/2021   GLUCOSE 113 (H) 05/30/2021   CHOL 156 12/14/2020   TRIG 102.0 12/14/2020   HDL 52.00 12/14/2020   LDLCALC 83 12/14/2020   ALT 30 05/30/2021   AST 22 05/30/2021   NA 139 05/30/2021   K 4.5 05/30/2021   CL 102 05/30/2021   CREATININE 0.97 05/30/2021   BUN 8 05/30/2021   CO2  28 05/30/2021   TSH 1.00 12/14/2020   PSA 0.76 06/29/2016   INR 0.96 06/30/2013   HGBA1C 5.7 12/14/2020    DG Chest 2 View  Result Date: 05/12/2021 CLINICAL DATA:  Presyncope EXAM: CHEST - 2 VIEW COMPARISON:  03/22/2018 FINDINGS: The lungs are  symmetrically well expanded. No confluent pulmonary infiltrate. Tiny left pleural effusion. No pneumothorax. Cardiac size within normal limits. Pulmonary vascularity is normal. No acute bone abnormality peer IMPRESSION: Tiny left pleural effusion. Electronically Signed   By: Fidela Salisbury M.D.   On: 05/12/2021 21:04    Assessment & Plan:   Problem List Items Addressed This Visit       Cardiovascular and Mediastinum   Hypertension    He was on 50 mg Losartan prn        Other   Myalgia    Continue on: Oxy 10 mg qid prn Fentanyl q 48 h Narcane prn  Potential benefits of a long term opioids use as well as potential risks (i.e. addiction risk, apnea etc) and complications (i.e. Somnolence, constipation and others) were explained to the patient and were aknowledged      Low back pain - Primary    Continue on: Oxy 10 mg qid prn Fentanyl q 48 h Narcane prn  Potential benefits of a long term opioids use as well as potential risks (i.e. addiction risk, apnea etc) and complications (i.e. Somnolence, constipation and others) were explained to the patient and were aknowledged      Relevant Medications   fentaNYL (DURAGESIC) 25 MCG/HR   fentaNYL (DURAGESIC) 25 MCG/HR   oxyCODONE (OXY IR/ROXICODONE) 5 MG immediate release tablet   oxyCODONE (OXY IR/ROXICODONE) 5 MG immediate release tablet   oxyCODONE (OXY IR/ROXICODONE) 5 MG immediate release tablet   fentaNYL (DURAGESIC) 25 MCG/HR   Depression    Chronic Cont on Cymbalta and Wellbutrin      COVID-19    Recent COVID19 - 03/27/22 Poss sinusitis - given Abx      Anxiety    Diazepam prn  Potential benefits of a long term benzodiazepines  use as well as potential risks  and complications  were explained to the patient and were aknowledged.         Meds ordered this encounter  Medications   fentaNYL (DURAGESIC) 25 MCG/HR    Sig: Place 1 patch onto the skin every other day.    Dispense:  15 patch    Refill:  0    Please fill on or after 05/11/22   fentaNYL (DURAGESIC) 25 MCG/HR    Sig: Place 1 patch onto the skin every other day.    Dispense:  15 patch    Refill:  0    Please fill on or after 06/10/22   oxyCODONE (OXY IR/ROXICODONE) 5 MG immediate release tablet    Sig: Take 2 tablets (10 mg total) by mouth every 6 (six) hours as needed for severe pain (chronic back pain).    Dispense:  240 tablet    Refill:  0    Please fill on or after 04/11/22   oxyCODONE (OXY IR/ROXICODONE) 5 MG immediate release tablet    Sig: Take 2 tablets (10 mg total) by mouth every 6 (six) hours as needed for severe pain (chronic back pain).    Dispense:  240 tablet    Refill:  0    Please fill on or after 05/11/22   oxyCODONE (OXY IR/ROXICODONE) 5 MG immediate release tablet    Sig: Take 2 tablets (10 mg total) by mouth every 6 (six) hours as needed for severe pain (chronic back pain).    Dispense:  160 tablet    Refill:  0    Please fill on or after 06/10/22.   fentaNYL (DURAGESIC) 25 MCG/HR    Sig:  Place 1 patch onto the skin every other day.    Dispense:  15 patch    Refill:  0    Please fill on or after 04/11/22   amoxicillin-clavulanate (AUGMENTIN) 875-125 MG tablet    Sig: Take 1 tablet by mouth 2 (two) times daily.    Dispense:  20 tablet    Refill:  0      Follow-up: No follow-ups on file.  Walker Kehr, MD

## 2022-04-05 NOTE — Assessment & Plan Note (Addendum)
Recent COVID19 - 03/27/22 Poss sinusitis - given Abx

## 2022-04-05 NOTE — Assessment & Plan Note (Signed)
Chronic Cont on Cymbalta and Wellbutrin

## 2022-04-27 DIAGNOSIS — Z79899 Other long term (current) drug therapy: Secondary | ICD-10-CM | POA: Diagnosis not present

## 2022-04-27 DIAGNOSIS — I7121 Aneurysm of the ascending aorta, without rupture: Secondary | ICD-10-CM | POA: Diagnosis not present

## 2022-04-27 LAB — BASIC METABOLIC PANEL
BUN/Creatinine Ratio: 9 — ABNORMAL LOW (ref 10–24)
BUN: 9 mg/dL (ref 8–27)
CO2: 25 mmol/L (ref 20–29)
Calcium: 9.2 mg/dL (ref 8.6–10.2)
Chloride: 100 mmol/L (ref 96–106)
Creatinine, Ser: 1.05 mg/dL (ref 0.76–1.27)
Glucose: 131 mg/dL — ABNORMAL HIGH (ref 70–99)
Potassium: 5 mmol/L (ref 3.5–5.2)
Sodium: 139 mmol/L (ref 134–144)
eGFR: 76 mL/min/{1.73_m2} (ref >59)

## 2022-05-02 ENCOUNTER — Encounter: Payer: Self-pay | Admitting: Podiatry

## 2022-05-02 ENCOUNTER — Ambulatory Visit (INDEPENDENT_AMBULATORY_CARE_PROVIDER_SITE_OTHER): Payer: Medicare Other | Admitting: Podiatry

## 2022-05-02 ENCOUNTER — Ambulatory Visit: Payer: Medicare Other | Admitting: Podiatry

## 2022-05-02 DIAGNOSIS — L600 Ingrowing nail: Secondary | ICD-10-CM | POA: Diagnosis not present

## 2022-05-02 NOTE — Progress Notes (Signed)
Subjective:  Patient ID: Lee Owens, male    DOB: 1950-08-19,   MRN: [id number]  Chief Complaint  Patient presents with   Toe Pain    2nd toe left - toenail turning under and pressing into skin, thickened skin, tender with walking    72 y.o. male presents for concern of left painful second toenail. He has been dealing with this for a while. Relates he wants to get the nail out of his skin and interested in options.  . Denies any other pedal complaints. Denies n/v/f/c.   Past Medical History:  Diagnosis Date   Allergic rhinitis    Anxiety    takes Valium bid   Arthritis    "hips, knees, hands" (07/07/2013)   Back pain    occasionally but reason unknown   BPH (benign prostatic hypertrophy)    takes Proscar daily   Chronic fatigue    Complication of anesthesia    Per pt, "Hard to sedate" past last colonoscopy in 2008!   Depression    takes Cymbalta daily   Diverticulosis    Dizziness    due to dehydration   Hemorrhoids    Hyperlipidemia    takes Lovastatin daily   Hypertension    Insomnia    takes Restoril nightly    Joint pain    knees/ankles/back pain   Joint swelling    Muscle pain    takes Baclofen daily   Night muscle spasms    takes Valium as needed   Pneumonia 1971   Right knee DJD    Urinary frequency    takes rapaflo daily   Vitamin B12 deficiency 2009   Vitamin D deficiency disease     Objective:  Physical Exam: Vascular: DP/PT pulses 2/4 bilateral. CFT <3 seconds. Normal hair growth on digits. No edema.  Skin. No lacerations or abrasions bilateral feet.  Musculoskeletal: MMT 5/5 bilateral lower extremities in DF, PF, Inversion and Eversion. Deceased ROM in DF of ankle joint.  Neurological: Sensation intact to light touch.   Assessment:  No diagnosis found.   Plan:  Patient was evaluated and treated and all questions answered. Patient requesting removal of ingrown nail today. Procedure below.  Discussed procedure and post procedure care and  patient expressed understanding.  Will follow-up in 2 weeks for nail check or sooner if any problems arise.    Procedure:  Procedure: total Nail Avulsion of left second digit nail Surgeon: Lorenda Peck, DPM  Pre-op Dx: Ingrown toenail without infection Post-op: Same  Place of Surgery: Office exam room.  Indications for surgery: Painful and ingrown toenail.    The patient is requesting removal of nail with chemical matrixectomy. Risks and complications were discussed with the patient for which they understand and written consent was obtained. Under sterile conditions a total of 3 mL of  1% lidocaine plain was infiltrated in a hallux block fashion. Once anesthetized, the skin was prepped in sterile fashion. A tourniquet was then applied. Next the entire left second digit nail was removed and phenol applied followed by being copiously irrigated. Silvadene was applied. A dry sterile dressing was applied. After application of the dressing the tourniquet was removed and there is found to be an immediate capillary refill time to the digit. The patient tolerated the procedure well without any complications. Post procedure instructions were discussed the patient for which he verbally understood. Follow-up in two weeks for nail check or sooner if any problems are to arise. Discussed signs/symptoms of infection and directed to  call the office immediately should any occur or go directly to the emergency room. In the meantime, encouraged to call the office with any questions, concerns, changes symptoms.   Lorenda Peck, DPM

## 2022-05-02 NOTE — Patient Instructions (Signed)

## 2022-05-04 ENCOUNTER — Ambulatory Visit (HOSPITAL_COMMUNITY)
Admission: RE | Admit: 2022-05-04 | Discharge: 2022-05-04 | Disposition: A | Discharge status: Home / Self Care | Payer: Medicare Other | Source: Ambulatory Visit | Attending: Cardiology | Admitting: Cardiology

## 2022-05-04 DIAGNOSIS — I7121 Aneurysm of the ascending aorta, without rupture: Secondary | ICD-10-CM | POA: Insufficient documentation

## 2022-05-04 MED ORDER — IOHEXOL 350 MG/ML SOLN
75.0000 mL | Freq: Once | INTRAVENOUS | Status: AC | PRN
  Administered 2022-05-04: 75 mL via INTRAVENOUS

## 2022-05-05 ENCOUNTER — Other Ambulatory Visit: Payer: Self-pay | Admitting: Internal Medicine

## 2022-05-08 MED ORDER — DIAZEPAM 10 MG PO TABS: ORAL_TABLET | ORAL | 3 refills | Status: DC

## 2022-05-09 ENCOUNTER — Other Ambulatory Visit: Payer: Self-pay | Admitting: Internal Medicine

## 2022-05-09 ENCOUNTER — Other Ambulatory Visit (HOSPITAL_BASED_OUTPATIENT_CLINIC_OR_DEPARTMENT_OTHER): Payer: Self-pay | Admitting: *Deleted

## 2022-05-09 ENCOUNTER — Ambulatory Visit: Payer: Medicare Other | Admitting: Podiatry

## 2022-05-09 DIAGNOSIS — I7121 Aneurysm of the ascending aorta, without rupture: Secondary | ICD-10-CM

## 2022-05-09 DIAGNOSIS — I712 Thoracic aortic aneurysm, without rupture, unspecified: Secondary | ICD-10-CM

## 2022-05-15 ENCOUNTER — Ambulatory Visit (INDEPENDENT_AMBULATORY_CARE_PROVIDER_SITE_OTHER): Payer: Medicare Other | Admitting: Podiatry

## 2022-05-15 ENCOUNTER — Encounter: Payer: Self-pay | Admitting: Podiatry

## 2022-05-15 DIAGNOSIS — L600 Ingrowing nail: Secondary | ICD-10-CM

## 2022-05-15 NOTE — Progress Notes (Signed)
  Subjective:  Patient ID: Lee Owens, male    DOB: Oct 02, 1950,   MRN: [id number]  No chief complaint on file.   72 y.o. male presents for follow-up of left second toenail avulsion. Relates doing well with minimal pain and has been soaking.  . Denies any other pedal complaints. Denies n/v/f/c.   Past Medical History:  Diagnosis Date   Allergic rhinitis    Anxiety    takes Valium bid   Arthritis    "hips, knees, hands" (07/07/2013)   Back pain    occasionally but reason unknown   BPH (benign prostatic hypertrophy)    takes Proscar daily   Chronic fatigue    Complication of anesthesia    Per pt, "Hard to sedate" past last colonoscopy in 2008!   Depression    takes Cymbalta daily   Diverticulosis    Dizziness    due to dehydration   Hemorrhoids    Hyperlipidemia    takes Lovastatin daily   Hypertension    Insomnia    takes Restoril nightly    Joint pain    knees/ankles/back pain   Joint swelling    Muscle pain    takes Baclofen daily   Night muscle spasms    takes Valium as needed   Pneumonia 1971   Right knee DJD    Urinary frequency    takes rapaflo daily   Vitamin B12 deficiency 2009   Vitamin D deficiency disease     Objective:  Physical Exam: Vascular: DP/PT pulses 2/4 bilateral. CFT <3 seconds. Normal hair growth on digits. No edema.  Skin. No lacerations or abrasions bilateral feet. Left second hallux nail bed healing well. Musculoskeletal: MMT 5/5 bilateral lower extremities in DF, PF, Inversion and Eversion. Deceased ROM in DF of ankle joint.  Neurological: Sensation intact to light touch.   Assessment:   1. Ingrown nail      Plan:  Patient was evaluated and treated and all questions answered. Toe was evaluated and appears to be healing well.  May discontinue soaks and neosporin.  Patient to follow-up as needed.    Lorenda Peck, DPM

## 2022-05-16 ENCOUNTER — Other Ambulatory Visit: Payer: Self-pay | Admitting: Internal Medicine

## 2022-06-11 ENCOUNTER — Encounter: Payer: Self-pay | Admitting: Internal Medicine

## 2022-06-13 ENCOUNTER — Other Ambulatory Visit: Payer: Self-pay | Admitting: Internal Medicine

## 2022-06-13 MED ORDER — OXYCODONE HCL 5 MG PO TABS: 10.0000 mg | ORAL_TABLET | Freq: Four times a day (QID) | ORAL | 0 refills | Status: DC | PRN

## 2022-07-05 ENCOUNTER — Ambulatory Visit: Payer: Medicare Other

## 2022-07-05 ENCOUNTER — Ambulatory Visit (INDEPENDENT_AMBULATORY_CARE_PROVIDER_SITE_OTHER): Payer: Medicare Other | Admitting: Internal Medicine

## 2022-07-05 ENCOUNTER — Encounter: Payer: Self-pay | Admitting: Internal Medicine

## 2022-07-05 VITALS — BP 130/86 | HR 72 | Temp 98.6°F | Ht 73.0 in | Wt 244.0 lb

## 2022-07-05 DIAGNOSIS — E669 Obesity, unspecified: Secondary | ICD-10-CM

## 2022-07-05 DIAGNOSIS — G8929 Other chronic pain: Secondary | ICD-10-CM | POA: Diagnosis not present

## 2022-07-05 DIAGNOSIS — M5441 Lumbago with sciatica, right side: Secondary | ICD-10-CM | POA: Diagnosis not present

## 2022-07-05 DIAGNOSIS — E538 Deficiency of other specified B group vitamins: Secondary | ICD-10-CM | POA: Diagnosis not present

## 2022-07-05 DIAGNOSIS — I10 Essential (primary) hypertension: Secondary | ICD-10-CM | POA: Diagnosis not present

## 2022-07-05 DIAGNOSIS — E119 Type 2 diabetes mellitus without complications: Secondary | ICD-10-CM | POA: Diagnosis not present

## 2022-07-05 DIAGNOSIS — F332 Major depressive disorder, recurrent severe without psychotic features: Secondary | ICD-10-CM

## 2022-07-05 DIAGNOSIS — E785 Hyperlipidemia, unspecified: Secondary | ICD-10-CM

## 2022-07-05 DIAGNOSIS — M5442 Lumbago with sciatica, left side: Secondary | ICD-10-CM | POA: Diagnosis not present

## 2022-07-05 DIAGNOSIS — F419 Anxiety disorder, unspecified: Secondary | ICD-10-CM

## 2022-07-05 MED ORDER — FENTANYL 25 MCG/HR TD PT72: 1.0000 | MEDICATED_PATCH | TRANSDERMAL | 0 refills | Status: DC

## 2022-07-05 MED ORDER — OXYCODONE HCL 5 MG PO TABS: 10.0000 mg | ORAL_TABLET | Freq: Four times a day (QID) | ORAL | 0 refills | Status: DC | PRN

## 2022-07-05 MED ORDER — OZEMPIC (0.25 OR 0.5 MG/DOSE) 2 MG/3ML ~~LOC~~ SOPN: PEN_INJECTOR | SUBCUTANEOUS | 1 refills | Status: DC

## 2022-07-05 NOTE — Assessment & Plan Note (Signed)
Diazepam prn  Potential benefits of a long term benzodiazepines  use as well as potential risks  and complications were explained to the patient and were aknowledged. 

## 2022-07-05 NOTE — Assessment & Plan Note (Signed)
Glu 131 Ozempic SQ suggested

## 2022-07-05 NOTE — Assessment & Plan Note (Signed)
Continue on: Oxy 10 mg qid prn Fentanyl q 48 h Narcane prn  Potential benefits of a long term opioids use as well as potential risks (i.e. addiction risk, apnea etc) and complications (i.e. Somnolence, constipation and others) were explained to the patient and were aknowledged 

## 2022-07-05 NOTE — Assessment & Plan Note (Signed)
On B12 

## 2022-07-05 NOTE — Assessment & Plan Note (Signed)
Cont on Cymbalta and Wellbutrin 

## 2022-07-05 NOTE — Assessment & Plan Note (Signed)
Lovastatin, aspirin CoQ10  

## 2022-07-05 NOTE — Assessment & Plan Note (Signed)
Worse 

## 2022-07-05 NOTE — Assessment & Plan Note (Signed)
NAS 

## 2022-07-05 NOTE — Progress Notes (Signed)
Subjective:  Patient ID: Lee Owens, male    DOB: 26-Jul-1950  Age: 72 y.o. MRN: [id number]  CC: Follow-up (3 mnth f/u, back pain and neck pain, refill on pain meds for 3 mnth period)   HPI Bailen Geffre presents for chronic pain - worse; spasms; hyperglycemia Outpatient Medications Prior to Visit  Medication Sig Dispense Refill   allopurinol (ZYLOPRIM) 300 MG tablet TAKE 1 TABLET(300 MG) BY MOUTH DAILY 90 tablet 3   amoxicillin-clavulanate (AUGMENTIN) 875-125 MG tablet Take 1 tablet by mouth 2 (two) times daily. 20 tablet 0   buPROPion (WELLBUTRIN XL) 150 MG 24 hr tablet TAKE 2 TABLETS(300 MG) BY MOUTH DAILY 180 tablet 1   Cholecalciferol (VITAMIN D-3) 5000 UNITS TABS Take 5,000 Units by mouth daily.     Coenzyme Q10 (COQ10) 400 MG CAPS 1 po qd 100 capsule 3   diazepam (VALIUM) 10 MG tablet TAKE 1/2 TO 1 TABLET(5 TO 10 MG) BY MOUTH EVERY 8 HOURS AS NEEDED FOR ANXIETY OR MUSCLE SPASMS 90 tablet 3   DULoxetine (CYMBALTA) 60 MG capsule TAKE 1 CAPSULE(60 MG) BY MOUTH TWICE DAILY 60 capsule 5   fentaNYL (DURAGESIC) 25 MCG/HR Place 1 patch onto the skin every other day. 15 patch 0   fentaNYL (DURAGESIC) 25 MCG/HR Place 1 patch onto the skin every other day. 15 patch 0   fentaNYL (DURAGESIC) 25 MCG/HR Place 1 patch onto the skin every other day. 15 patch 0   finasteride (PROSCAR) 5 MG tablet Take 5 mg by mouth daily.     gabapentin (NEURONTIN) 300 MG capsule TAKE 1 CAPSULE(300 MG) BY MOUTH THREE TIMES DAILY 270 capsule 1   losartan (COZAAR) 25 MG tablet Take 12.5 mg by mouth at bedtime.     lovastatin (MEVACOR) 40 MG tablet TAKE 1 TABLET(40 MG) BY MOUTH AT BEDTIME 90 tablet 3   NARCAN 4 MG/0.1ML LIQD nasal spray kit Place 0.1 sprays (0.4 mg total) into the nose as needed. 2 each 3   oxyCODONE (OXY IR/ROXICODONE) 5 MG immediate release tablet Take 2 tablets (10 mg total) by mouth every 6 (six) hours as needed for severe pain (chronic back pain). 240 tablet 0   oxyCODONE (OXY IR/ROXICODONE) 5  MG immediate release tablet Take 2 tablets (10 mg total) by mouth every 6 (six) hours as needed for severe pain (chronic back pain). 240 tablet 0   oxyCODONE (OXY IR/ROXICODONE) 5 MG immediate release tablet Take 2 tablets (10 mg total) by mouth every 6 (six) hours as needed for severe pain (chronic back pain). 160 tablet 0   oxyCODONE (ROXICODONE) 5 MG immediate release tablet Take 2 tablets (10 mg total) by mouth every 6 (six) hours as needed for severe pain. 80 tablet 0   tamsulosin (FLOMAX) 0.4 MG CAPS capsule Take 1 capsule by mouth daily.  2   temazepam (RESTORIL) 30 MG capsule TAKE 1 TO 2 CAPSULES BY MOUTH AT BEDTIME AS NEEDED FOR SLEEP 180 capsule 1   vitamin B-12 (CYANOCOBALAMIN) 1000 MCG tablet Take 2,000 mcg by mouth daily.     No facility-administered medications prior to visit.    ROS: Review of Systems  Constitutional:  Negative for appetite change, fatigue and unexpected weight change.  HENT:  Negative for congestion, nosebleeds, sneezing, sore throat and trouble swallowing.   Eyes:  Negative for itching and visual disturbance.  Respiratory:  Negative for cough.   Cardiovascular:  Negative for chest pain, palpitations and leg swelling.  Gastrointestinal:  Negative for abdominal distention, blood  in stool, diarrhea and nausea.  Genitourinary:  Negative for frequency and hematuria.  Musculoskeletal:  Positive for arthralgias and back pain. Negative for gait problem, joint swelling and neck pain.  Skin:  Negative for rash.  Neurological:  Negative for dizziness, tremors, speech difficulty and weakness.  Psychiatric/Behavioral:  Negative for agitation, dysphoric mood, sleep disturbance and suicidal ideas. The patient is not nervous/anxious.     Objective:  BP 130/86 (BP Location: Left Arm, Patient Position: Sitting, Cuff Size: Large)   Pulse 72   Temp 98.6 F (37 C) (Oral)   Ht 6\' 1"  (1.854 m)   Wt 244 lb (110.7 kg)   SpO2 91%   BMI 32.19 kg/m   BP Readings from Last 3  Encounters:  07/05/22 130/86  04/05/22 120/78  02/08/22 128/78    Wt Readings from Last 3 Encounters:  07/05/22 244 lb (110.7 kg)  04/05/22 248 lb (112.5 kg)  02/08/22 248 lb (112.5 kg)    Physical Exam Constitutional:      General: He is not in acute distress.    Appearance: He is well-developed. He is obese.     Comments: NAD  Eyes:     Conjunctiva/sclera: Conjunctivae normal.     Pupils: Pupils are equal, round, and reactive to light.  Neck:     Thyroid: No thyromegaly.     Vascular: No JVD.  Cardiovascular:     Rate and Rhythm: Normal rate and regular rhythm.     Heart sounds: Normal heart sounds. No murmur heard.    No friction rub. No gallop.  Pulmonary:     Effort: Pulmonary effort is normal. No respiratory distress.     Breath sounds: Normal breath sounds. No wheezing or rales.  Chest:     Chest wall: No tenderness.  Abdominal:     General: Bowel sounds are normal. There is no distension.     Palpations: Abdomen is soft. There is no mass.     Tenderness: There is no abdominal tenderness. There is no guarding or rebound.  Musculoskeletal:        General: No tenderness. Normal range of motion.     Cervical back: Normal range of motion.  Lymphadenopathy:     Cervical: No cervical adenopathy.  Skin:    General: Skin is warm and dry.     Findings: No rash.  Neurological:     Mental Status: He is alert and oriented to person, place, and time.     Cranial Nerves: No cranial nerve deficit.     Motor: No abnormal muscle tone.     Coordination: Coordination normal.     Gait: Gait normal.     Deep Tendon Reflexes: Reflexes are normal and symmetric.  Psychiatric:        Behavior: Behavior normal.        Thought Content: Thought content normal.        Judgment: Judgment normal.     Lab Results  Component Value Date   WBC 7.0 05/30/2021   HGB 14.5 05/30/2021   HCT 42.7 05/30/2021   PLT 211.0 05/30/2021   GLUCOSE 131 (H) 04/27/2022   CHOL 156 12/14/2020    TRIG 102.0 12/14/2020   HDL 52.00 12/14/2020   LDLCALC 83 12/14/2020   ALT 30 05/30/2021   AST 22 05/30/2021   NA 139 04/27/2022   K 5.0 04/27/2022   CL 100 04/27/2022   CREATININE 1.05 04/27/2022   BUN 9 04/27/2022   CO2 25 04/27/2022  TSH 1.00 12/14/2020   PSA 0.76 06/29/2016   INR 0.96 06/30/2013   HGBA1C 5.7 12/14/2020    CT ANGIO CHEST AORTA W/CM & OR WO/CM  Result Date: 05/04/2022 CLINICAL DATA:  Aortic aneurysm suspected EXAM: CT ANGIOGRAPHY CHEST WITH CONTRAST TECHNIQUE: Multidetector CT imaging of the chest was performed using the standard protocol during bolus administration of intravenous contrast. Multiplanar CT image reconstructions and MIPs were obtained to evaluate the vascular anatomy. RADIATION DOSE REDUCTION: This exam was performed according to the departmental dose-optimization program which includes automated exposure control, adjustment of the mA and/or kV according to patient size and/or use of iterative reconstruction technique. CONTRAST:  34mL OMNIPAQUE IOHEXOL 350 MG/ML SOLN COMPARISON:  Chest XR 05/12/2021.  CTA chest, 04/27/2021. FINDINGS: CARDIOVASCULAR: Limitations by motion: Mild Preferential opacification of the thoracic aorta. No evidence of thoracic aortic dissection. Aortic Root: --Valve: 2.9 cm --Sinuses: 4.1 cm --Sinotubular Junction: 3.4 cm Thoracic Aorta: --Ascending Aorta: 4.5 cm --Aortic Arch: 3.0 cm --Descending Aorta: 3.0 cm Other: Conventional 3-sided LEFT aortic arch. Mild atherosclerosis without hemodynamic stenosis at the origins of the great vessels. Normal heart size. No pericardial effusion. Mild-to-moderate burden of multivessel coronary atherosclerosis, greatest within the LAD. Mediastinum/Nodes: No enlarged mediastinal, hilar, or axillary lymph nodes. Thyroid gland, trachea, and esophagus demonstrate no significant findings. Lungs/Pleura: Lungs are clear without focal consolidation, mass or suspicious pulmonary nodule. No pleural effusion or  pneumothorax. Upper Abdomen: Relative hypodensity of liver consistent with hepatic steatosis. No acute abnormality. Musculoskeletal: Gynecomastia. No acute chest wall abnormality. Scattered degenerative changes of the spine. No acute or significant osseous findings. Review of the MIP images confirms the above findings. IMPRESSION: 1. 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 Continue semi-annual imaging followup by CTA or MRA and referral to cardiothoracic surgery if not already obtained. This recommendation follows 2010 ACCF/AHA/AATS/ACR/ASA/SCA/SCAI/SIR/STS/SVM Guidelines for the Diagnosis and Management of Patients With Thoracic Aortic Disease. Circulation. 2010; 121: E761-Y709. Aortic aneurysm NOS (ICD10-I71.9) 2. Hepatic steatosis, coronary and Aortic Atherosclerosis (ICD10-I70.0). Additional incidental, chronic and senescent findings as above. Electronically Signed   By: Roanna Banning M.D.   On: 05/04/2022 13:21    Assessment & Plan:   Problem List Items Addressed This Visit       Cardiovascular and Mediastinum   Hypertension    NAS        Endocrine   Diabetes mellitus, type 2    Glu 131 Ozempic SQ suggested       Relevant Medications   Semaglutide,0.25 or 0.5MG /DOS, (OZEMPIC, 0.25 OR 0.5 MG/DOSE,) 2 MG/3ML SOPN     Other   Anxiety    Diazepam prn  Potential benefits of a long term benzodiazepines  use as well as potential risks  and complications were explained to the patient and were aknowledged.      Depression    Cont on Cymbalta and Wellbutrin      Dyslipidemia     Lovastatin, aspirin CoQ10      Low back pain - Primary    Continue on: Oxy 10 mg qid prn Fentanyl q 48 h Narcane prn  Potential benefits of a long term opioids use as well as potential risks (i.e. addiction risk, apnea etc) and complications (i.e. Somnolence, constipation and others) were explained to the patient and were aknowledged      Obesity (BMI 30-39.9)     Worse       Relevant Medications   Semaglutide,0.25 or 0.5MG /DOS, (OZEMPIC, 0.25 OR 0.5 MG/DOSE,) 2 MG/3ML SOPN  Vitamin B12 deficiency    On B12         Meds ordered this encounter  Medications   Semaglutide,0.25 or 0.5MG /DOS, (OZEMPIC, 0.25 OR 0.5 MG/DOSE,) 2 MG/3ML SOPN    Sig: Use 0.25 mg weekly sq for 1 month, then 0.5 mg sq weekly    Dispense:  9 mL    Refill:  1      Follow-up: Return in about 3 months (around 10/04/2022).  Sonda PrimesAlex Isaih Bulger, MD

## 2022-07-31 ENCOUNTER — Other Ambulatory Visit (HOSPITAL_COMMUNITY): Payer: Self-pay

## 2022-07-31 ENCOUNTER — Telehealth: Payer: Self-pay

## 2022-07-31 NOTE — Telephone Encounter (Signed)
Pharmacy Patient Advocate Encounter  Prior Authorization for [facility] has been approved by [employer] (ins).    PA # [id number] Effective dates: 03/27/2022 through 03/27/2023  Spoke with Pharmacy to process.  Georga Bora Rx Patient Advocate [phone]

## 2022-08-07 ENCOUNTER — Ambulatory Visit: Payer: Medicare Other | Admitting: Podiatry

## 2022-09-04 ENCOUNTER — Telehealth: Payer: Self-pay | Admitting: Internal Medicine

## 2022-09-04 NOTE — Telephone Encounter (Signed)
Patient called and said they have two refills coming up soon, but they are scheduled for different days. One is due to be refilled on 09/08/2022 and the other is scheduled for 09/09/2022. They would like to know if the refill of their oxycodone can be changed to 09/08/2022 do they can pick up both medications on the same day. Best callback is [phone].

## 2022-09-04 NOTE — Telephone Encounter (Signed)
OK to fill on the same day. Thx

## 2022-09-05 NOTE — Telephone Encounter (Signed)
Called [facility] spoke w/ Lelon Mast gave her MD response. She states will put a note to fill on the same day. Notified pt early refill ok for the 09/08/22...Raechel Chute

## 2022-09-18 ENCOUNTER — Telehealth (HOSPITAL_BASED_OUTPATIENT_CLINIC_OR_DEPARTMENT_OTHER): Payer: Self-pay | Admitting: Cardiology

## 2022-09-18 DIAGNOSIS — I10 Essential (primary) hypertension: Secondary | ICD-10-CM

## 2022-09-18 NOTE — Telephone Encounter (Signed)
New Message:     Patient had a CT in February and he is supposed to have another one in August. His wife wants to know does Dr Cristal Deer need to see him, before or after his CT? Does he need to have lab work? Does he need to see a Careers adviser. Wife says she has a lot of questions.do

## 2022-09-18 NOTE — Telephone Encounter (Signed)
Returned call to patient's wife (ok per DPR), Ct to be completed in August, will need BMP one week prior to CT. He has recall for follow up with Dr. Cristal Deer in November. Labs ordered and mailed to patient.

## 2022-10-02 ENCOUNTER — Ambulatory Visit (INDEPENDENT_AMBULATORY_CARE_PROVIDER_SITE_OTHER): Payer: Medicare Other | Admitting: Internal Medicine

## 2022-10-02 ENCOUNTER — Encounter: Payer: Self-pay | Admitting: Internal Medicine

## 2022-10-02 VITALS — BP 122/80 | HR 67 | Temp 98.4°F | Wt 243.0 lb

## 2022-10-02 DIAGNOSIS — F39 Unspecified mood [affective] disorder: Secondary | ICD-10-CM | POA: Diagnosis not present

## 2022-10-02 DIAGNOSIS — E538 Deficiency of other specified B group vitamins: Secondary | ICD-10-CM | POA: Diagnosis not present

## 2022-10-02 DIAGNOSIS — I77810 Thoracic aortic ectasia: Secondary | ICD-10-CM | POA: Diagnosis not present

## 2022-10-02 DIAGNOSIS — R609 Edema, unspecified: Secondary | ICD-10-CM

## 2022-10-02 DIAGNOSIS — I2510 Atherosclerotic heart disease of native coronary artery without angina pectoris: Secondary | ICD-10-CM

## 2022-10-02 DIAGNOSIS — E119 Type 2 diabetes mellitus without complications: Secondary | ICD-10-CM | POA: Diagnosis not present

## 2022-10-02 DIAGNOSIS — Z7985 Long-term (current) use of injectable non-insulin antidiabetic drugs: Secondary | ICD-10-CM | POA: Diagnosis not present

## 2022-10-02 DIAGNOSIS — M791 Myalgia, unspecified site: Secondary | ICD-10-CM | POA: Diagnosis not present

## 2022-10-02 DIAGNOSIS — R972 Elevated prostate specific antigen [PSA]: Secondary | ICD-10-CM

## 2022-10-02 MED ORDER — FENTANYL 25 MCG/HR TD PT72: 1.0000 | MEDICATED_PATCH | TRANSDERMAL | 0 refills | Status: DC

## 2022-10-02 MED ORDER — OXYCODONE HCL 5 MG PO TABS: 10.0000 mg | ORAL_TABLET | Freq: Four times a day (QID) | ORAL | 0 refills | Status: DC | PRN

## 2022-10-02 MED ORDER — SEMAGLUTIDE (1 MG/DOSE) 4 MG/3ML ~~LOC~~ SOPN: 1.0000 mg | PEN_INJECTOR | SUBCUTANEOUS | 3 refills | Status: DC

## 2022-10-02 MED ORDER — DIAZEPAM 10 MG PO TABS: ORAL_TABLET | ORAL | 3 refills | Status: DC

## 2022-10-02 MED ORDER — FUROSEMIDE 40 MG PO TABS: 20.0000 mg | ORAL_TABLET | Freq: Every day | ORAL | 5 refills | Status: DC | PRN

## 2022-10-02 NOTE — Assessment & Plan Note (Signed)
On B12/B complex 

## 2022-10-02 NOTE — Assessment & Plan Note (Signed)
F/u w/Dr Christopher 

## 2022-10-02 NOTE — Assessment & Plan Note (Signed)
Dr Retta Diones - s/p neg prostate bx

## 2022-10-02 NOTE — Assessment & Plan Note (Signed)
On ASA 

## 2022-10-02 NOTE — Assessment & Plan Note (Signed)
Mild  Furosemide prn 

## 2022-10-02 NOTE — Assessment & Plan Note (Signed)
Continue on: Oxy 10 mg qid prn Fentanyl q 48 h Narcane prn  Potential benefits of a long term opioids use as well as potential risks (i.e. addiction risk, apnea etc) and complications (i.e. Somnolence, constipation and others) were explained to the patient and were aknowledged 

## 2022-10-02 NOTE — Progress Notes (Signed)
Subjective:  Patient ID: Lee Owens, male    DOB: 1950-07-08  Age: 72 y.o. MRN: [id number]  CC: Follow-up (3 MNTH F/U)   HPI Lee Owens presents for chronic pain, temper issues, muscle spasms  Outpatient Medications Prior to Visit  Medication Sig Dispense Refill   allopurinol (ZYLOPRIM) 300 MG tablet TAKE 1 TABLET(300 MG) BY MOUTH DAILY 90 tablet 3   Cholecalciferol (VITAMIN D-3) 5000 UNITS TABS Take 5,000 Units by mouth daily.     Coenzyme Q10 (COQ10) 400 MG CAPS 1 po qd 100 capsule 3   DULoxetine (CYMBALTA) 60 MG capsule TAKE 1 CAPSULE(60 MG) BY MOUTH TWICE DAILY 60 capsule 5   finasteride (PROSCAR) 5 MG tablet Take 5 mg by mouth daily.     gabapentin (NEURONTIN) 300 MG capsule TAKE 1 CAPSULE(300 MG) BY MOUTH THREE TIMES DAILY 270 capsule 1   losartan (COZAAR) 25 MG tablet Take 12.5 mg by mouth at bedtime.     lovastatin (MEVACOR) 40 MG tablet TAKE 1 TABLET(40 MG) BY MOUTH AT BEDTIME 90 tablet 3   NARCAN 4 MG/0.1ML LIQD nasal spray kit Place 0.1 sprays (0.4 mg total) into the nose as needed. 2 each 3   tamsulosin (FLOMAX) 0.4 MG CAPS capsule Take 1 capsule by mouth daily.  2   temazepam (RESTORIL) 30 MG capsule TAKE 1 TO 2 CAPSULES BY MOUTH AT BEDTIME AS NEEDED FOR SLEEP 180 capsule 1   vitamin B-12 (CYANOCOBALAMIN) 1000 MCG tablet Take 2,000 mcg by mouth daily.     buPROPion (WELLBUTRIN XL) 150 MG 24 hr tablet TAKE 2 TABLETS(300 MG) BY MOUTH DAILY 180 tablet 1   diazepam (VALIUM) 10 MG tablet TAKE 1/2 TO 1 TABLET(5 TO 10 MG) BY MOUTH EVERY 8 HOURS AS NEEDED FOR ANXIETY OR MUSCLE SPASMS 90 tablet 3   fentaNYL (DURAGESIC) 25 MCG/HR Place 1 patch onto the skin every other day. 15 patch 0   fentaNYL (DURAGESIC) 25 MCG/HR Place 1 patch onto the skin every other day. 15 patch 0   fentaNYL (DURAGESIC) 25 MCG/HR Place 1 patch onto the skin every other day. 15 patch 0   oxyCODONE (OXY IR/ROXICODONE) 5 MG immediate release tablet Take 2 tablets (10 mg total) by mouth every 6 (six) hours  as needed for severe pain (chronic back pain). 240 tablet 0   oxyCODONE (OXY IR/ROXICODONE) 5 MG immediate release tablet Take 2 tablets (10 mg total) by mouth every 6 (six) hours as needed for severe pain (chronic back pain). 240 tablet 0   oxyCODONE (OXY IR/ROXICODONE) 5 MG immediate release tablet Take 2 tablets (10 mg total) by mouth every 6 (six) hours as needed for severe pain (chronic back pain). 240 tablet 0   Semaglutide,0.25 or 0.5MG /DOS, (OZEMPIC, 0.25 OR 0.5 MG/DOSE,) 2 MG/3ML SOPN Use 0.25 mg weekly sq for 1 month, then 0.5 mg sq weekly 9 mL 1   amoxicillin-clavulanate (AUGMENTIN) 875-125 MG tablet Take 1 tablet by mouth 2 (two) times daily. (Patient not taking: Reported on 10/02/2022) 20 tablet 0   No facility-administered medications prior to visit.    ROS: Review of Systems  Constitutional:  Positive for fatigue. Negative for appetite change and unexpected weight change.  HENT:  Negative for congestion, nosebleeds, sneezing, sore throat and trouble swallowing.   Eyes:  Negative for itching and visual disturbance.  Respiratory:  Negative for cough.   Cardiovascular:  Negative for chest pain, palpitations and leg swelling.  Gastrointestinal:  Negative for abdominal distention, blood in stool, diarrhea and  nausea.  Genitourinary:  Negative for frequency and hematuria.  Musculoskeletal:  Negative for back pain, gait problem, joint swelling and neck pain.  Skin:  Negative for rash.  Neurological:  Negative for dizziness, tremors, speech difficulty and weakness.  Psychiatric/Behavioral:  Negative for agitation, dysphoric mood and sleep disturbance. The patient is nervous/anxious.     Objective:  BP 122/80 (BP Location: Right Arm, Patient Position: Sitting, Cuff Size: Large)   Pulse 67   Temp 98.4 F (36.9 C) (Oral)   Wt 243 lb (110.2 kg)   SpO2 94%   BMI 32.06 kg/m   BP Readings from Last 3 Encounters:  10/02/22 122/80  07/05/22 130/86  04/05/22 120/78    Wt Readings  from Last 3 Encounters:  10/02/22 243 lb (110.2 kg)  07/05/22 244 lb (110.7 kg)  04/05/22 248 lb (112.5 kg)    Physical Exam Constitutional:      General: He is not in acute distress.    Appearance: He is well-developed. He is obese.     Comments: NAD  Eyes:     Conjunctiva/sclera: Conjunctivae normal.     Pupils: Pupils are equal, round, and reactive to light.  Neck:     Thyroid: No thyromegaly.     Vascular: No JVD.  Cardiovascular:     Rate and Rhythm: Normal rate and regular rhythm.     Heart sounds: Normal heart sounds. No murmur heard.    No friction rub. No gallop.  Pulmonary:     Effort: Pulmonary effort is normal. No respiratory distress.     Breath sounds: Normal breath sounds. No wheezing or rales.  Chest:     Chest wall: No tenderness.  Abdominal:     General: Bowel sounds are normal. There is no distension.     Palpations: Abdomen is soft. There is no mass.     Tenderness: There is no abdominal tenderness. There is no guarding or rebound.  Musculoskeletal:        General: Tenderness present. Normal range of motion.     Cervical back: Normal range of motion.  Lymphadenopathy:     Cervical: No cervical adenopathy.  Skin:    General: Skin is warm and dry.     Findings: No rash.  Neurological:     Mental Status: He is alert and oriented to person, place, and time.     Cranial Nerves: No cranial nerve deficit.     Motor: No abnormal muscle tone.     Coordination: Coordination abnormal.     Gait: Gait abnormal.     Deep Tendon Reflexes: Reflexes are normal and symmetric.  Psychiatric:        Behavior: Behavior normal.        Thought Content: Thought content normal.        Judgment: Judgment normal.   Antalgic gait Trace edema B  Lab Results  Component Value Date   WBC 7.0 05/30/2021   HGB 14.5 05/30/2021   HCT 42.7 05/30/2021   PLT 211.0 05/30/2021   GLUCOSE 131 (H) 04/27/2022   CHOL 156 12/14/2020   TRIG 102.0 12/14/2020   HDL 52.00 12/14/2020    LDLCALC 83 12/14/2020   ALT 30 05/30/2021   AST 22 05/30/2021   NA 139 04/27/2022   K 5.0 04/27/2022   CL 100 04/27/2022   CREATININE 1.05 04/27/2022   BUN 9 04/27/2022   CO2 25 04/27/2022   TSH 1.00 12/14/2020   PSA 0.76 06/29/2016   INR 0.96 06/30/2013  HGBA1C 5.7 12/14/2020    CT ANGIO CHEST AORTA W/CM & OR WO/CM  Result Date: 05/04/2022 CLINICAL DATA:  Aortic aneurysm suspected EXAM: CT ANGIOGRAPHY CHEST WITH CONTRAST TECHNIQUE: Multidetector CT imaging of the chest was performed using the standard protocol during bolus administration of intravenous contrast. Multiplanar CT image reconstructions and MIPs were obtained to evaluate the vascular anatomy. RADIATION DOSE REDUCTION: This exam was performed according to the departmental dose-optimization program which includes automated exposure control, adjustment of the mA and/or kV according to patient size and/or use of iterative reconstruction technique. CONTRAST:  75mL OMNIPAQUE IOHEXOL 350 MG/ML SOLN COMPARISON:  Chest XR 05/12/2021.  CTA chest, 04/27/2021. FINDINGS: CARDIOVASCULAR: Limitations by motion: Mild Preferential opacification of the thoracic aorta. No evidence of thoracic aortic dissection. Aortic Root: --Valve: 2.9 cm --Sinuses: 4.1 cm --Sinotubular Junction: 3.4 cm Thoracic Aorta: --Ascending Aorta: 4.5 cm --Aortic Arch: 3.0 cm --Descending Aorta: 3.0 cm Other: Conventional 3-sided LEFT aortic arch. Mild atherosclerosis without hemodynamic stenosis at the origins of the great vessels. Normal heart size. No pericardial effusion. Mild-to-moderate burden of multivessel coronary atherosclerosis, greatest within the LAD. Mediastinum/Nodes: No enlarged mediastinal, hilar, or axillary lymph nodes. Thyroid gland, trachea, and esophagus demonstrate no significant findings. Lungs/Pleura: Lungs are clear without focal consolidation, mass or suspicious pulmonary nodule. No pleural effusion or pneumothorax. Upper Abdomen: Relative hypodensity of  liver consistent with hepatic steatosis. No acute abnormality. Musculoskeletal: Gynecomastia. No acute chest wall abnormality. Scattered degenerative changes of the spine. No acute or significant osseous findings. Review of the MIP images confirms the above findings. IMPRESSION: 1. 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 Continue semi-annual imaging followup by CTA or MRA and referral to cardiothoracic surgery if not already obtained. This recommendation follows 2010 ACCF/AHA/AATS/ACR/ASA/SCA/SCAI/SIR/STS/SVM Guidelines for the Diagnosis and Management of Patients With Thoracic Aortic Disease. Circulation. 2010; 121: W098-J191. Aortic aneurysm NOS (ICD10-I71.9) 2. Hepatic steatosis, coronary and Aortic Atherosclerosis (ICD10-I70.0). Additional incidental, chronic and senescent findings as above. Electronically Signed   By: Roanna Banning M.D.   On: 05/04/2022 13:21    Assessment & Plan:   Problem List Items Addressed This Visit     Myalgia - Primary    Continue on: Oxy 10 mg qid prn Fentanyl q 48 h Narcane prn  Potential benefits of a long term opioids use as well as potential risks (i.e. addiction risk, apnea etc) and complications (i.e. Somnolence, constipation and others) were explained to the patient and were aknowledged      PSA, INCREASED     Dr Retta Diones - s/p neg prostate bx      Diabetes mellitus, type 2 (HCC)    Monitor Hgb A1c      Relevant Medications   Semaglutide, 1 MG/DOSE, 4 MG/3ML SOPN   Vitamin B12 deficiency    On B12/B complex      Mood disorder (HCC)    Worse Hold Wellbutrin Psychology ref was offered      Thoracic aortic ectasia (HCC)    F/u w/Dr. Cristal Deer      Relevant Medications   furosemide (LASIX) 40 MG tablet   Coronary artery calcification seen on CT scan    On ASA      Relevant Medications   furosemide (LASIX) 40 MG tablet   Edema    Mild Furosemide prn         Meds ordered this encounter  Medications    fentaNYL (DURAGESIC) 25 MCG/HR    Sig: Place 1 patch onto the skin every other  day.    Dispense:  15 patch    Refill:  0    Please fill on or after 12/08/22   oxyCODONE (OXY IR/ROXICODONE) 5 MG immediate release tablet    Sig: Take 2 tablets (10 mg total) by mouth every 6 (six) hours as needed for severe pain (chronic back pain).    Dispense:  240 tablet    Refill:  0    Please fill on or after 12/08/22   diazepam (VALIUM) 10 MG tablet    Sig: TAKE 1/2 TO 1 TABLET(5 TO 10 MG) BY MOUTH EVERY 8 HOURS AS NEEDED FOR ANXIETY OR MUSCLE SPASMS    Dispense:  90 tablet    Refill:  3   fentaNYL (DURAGESIC) 25 MCG/HR    Sig: Place 1 patch onto the skin every other day.    Dispense:  15 patch    Refill:  0    Please fill on or after 10/09/22   fentaNYL (DURAGESIC) 25 MCG/HR    Sig: Place 1 patch onto the skin every other day.    Dispense:  15 patch    Refill:  0    Please fill on or after 11/08/22   oxyCODONE (OXY IR/ROXICODONE) 5 MG immediate release tablet    Sig: Take 2 tablets (10 mg total) by mouth every 6 (six) hours as needed for severe pain (chronic back pain).    Dispense:  240 tablet    Refill:  0    Please fill on or after 11/08/22   oxyCODONE (OXY IR/ROXICODONE) 5 MG immediate release tablet    Sig: Take 2 tablets (10 mg total) by mouth every 6 (six) hours as needed for severe pain (chronic back pain).    Dispense:  240 tablet    Refill:  0    Please fill on or after 10/09/22.   Semaglutide, 1 MG/DOSE, 4 MG/3ML SOPN    Sig: Inject 1 mg as directed once a week.    Dispense:  3 mL    Refill:  3   furosemide (LASIX) 40 MG tablet    Sig: Take 0.5-1 tablets (20-40 mg total) by mouth daily as needed.    Dispense:  30 tablet    Refill:  5      Follow-up: Return in about 3 months (around 01/02/2023) for a follow-up visit.  Sonda Primes, MD

## 2022-10-02 NOTE — Assessment & Plan Note (Signed)
Worse Hold Wellbutrin Psychology ref was offered

## 2022-10-02 NOTE — Assessment & Plan Note (Signed)
Monitor HgbA1c 

## 2022-10-08 ENCOUNTER — Encounter: Payer: Self-pay | Admitting: Internal Medicine

## 2022-10-09 NOTE — Telephone Encounter (Signed)
Noted../lmb 

## 2022-10-18 ENCOUNTER — Other Ambulatory Visit: Payer: Self-pay | Admitting: Internal Medicine

## 2022-11-01 DIAGNOSIS — I10 Essential (primary) hypertension: Secondary | ICD-10-CM | POA: Diagnosis not present

## 2022-11-07 ENCOUNTER — Telehealth (HOSPITAL_BASED_OUTPATIENT_CLINIC_OR_DEPARTMENT_OTHER): Payer: Self-pay

## 2022-11-07 DIAGNOSIS — I10 Essential (primary) hypertension: Secondary | ICD-10-CM

## 2022-11-07 NOTE — Telephone Encounter (Addendum)
Seen by patient Lee Owens on 11/06/2022  9:02 PM; follow up mychart message sent to patient, labs ordered   ----- Message from [facility] sent at 11/06/2022  1:29 PM EDT ----- Potassium mildly elevated, runs slightly on the higher side historically. The only medication that raises potassium is losartan 12.5 mg. Labs are ok for CT, but would recheck his potassium in about 3 weeks. If still elevated would need to stop the losartan.

## 2022-11-09 ENCOUNTER — Encounter (HOSPITAL_BASED_OUTPATIENT_CLINIC_OR_DEPARTMENT_OTHER): Payer: Self-pay

## 2022-11-09 ENCOUNTER — Ambulatory Visit (HOSPITAL_BASED_OUTPATIENT_CLINIC_OR_DEPARTMENT_OTHER)
Admission: RE | Admit: 2022-11-09 | Discharge: 2022-11-09 | Disposition: A | Discharge status: Home / Self Care | Payer: Medicare Other | Source: Ambulatory Visit | Attending: Cardiology | Admitting: Cardiology

## 2022-11-09 DIAGNOSIS — I712 Thoracic aortic aneurysm, without rupture, unspecified: Secondary | ICD-10-CM | POA: Insufficient documentation

## 2022-11-09 DIAGNOSIS — I2510 Atherosclerotic heart disease of native coronary artery without angina pectoris: Secondary | ICD-10-CM | POA: Diagnosis not present

## 2022-11-09 DIAGNOSIS — I719 Aortic aneurysm of unspecified site, without rupture: Secondary | ICD-10-CM | POA: Diagnosis not present

## 2022-11-09 DIAGNOSIS — I700 Atherosclerosis of aorta: Secondary | ICD-10-CM | POA: Diagnosis not present

## 2022-11-09 MED ORDER — IOHEXOL 350 MG/ML SOLN
100.0000 mL | Freq: Once | INTRAVENOUS | Status: AC | PRN
  Administered 2022-11-09: 75 mL via INTRAVENOUS

## 2022-11-13 ENCOUNTER — Other Ambulatory Visit: Payer: Self-pay | Admitting: Internal Medicine

## 2022-11-19 ENCOUNTER — Other Ambulatory Visit: Payer: Self-pay | Admitting: Internal Medicine

## 2022-11-22 ENCOUNTER — Other Ambulatory Visit: Payer: Self-pay | Admitting: Internal Medicine

## 2022-11-22 DIAGNOSIS — I10 Essential (primary) hypertension: Secondary | ICD-10-CM | POA: Diagnosis not present

## 2022-11-25 IMAGING — CT CT ANGIO CHEST
3 of 6 series · 16 of 46 positions shown · IV contrast (agent unspecified)
Comparison: 05/19/2020; 04/30/2019; coronary artery calcium
scoring-05/21/2018

CLINICAL DATA: Follow-up thoracic aortic ectasia.

EXAM:
CT ANGIOGRAPHY CHEST WITH CONTRAST
TECHNIQUE: Multidetector CT imaging of the chest was performed using the
standard protocol during bolus administration of intravenous
contrast. Multiplanar CT image reconstructions and MIPs were
obtained to evaluate the vascular anatomy.

[Series 8: axial arterial · axial · arterial · 0.83mm/px · z∈[+31,+328]mm · 11 of 119 slices shown]
[im 10/119  lung]
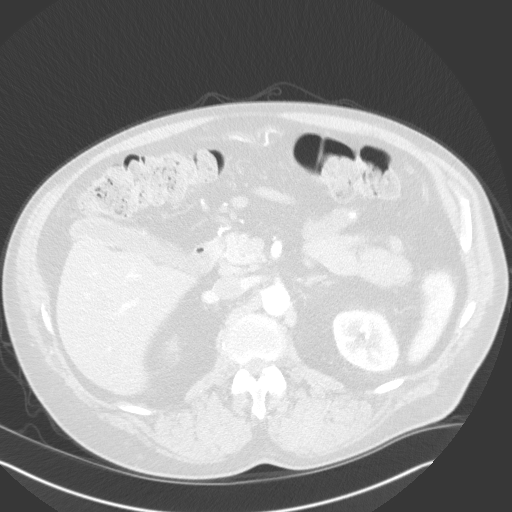
[im 20/119  soft-tissue]
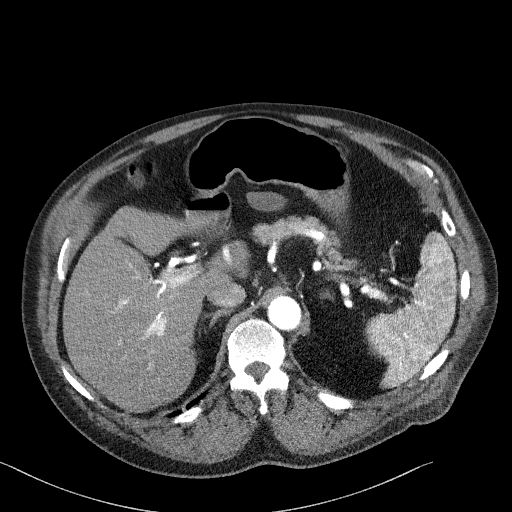
[im 30/119  lung]
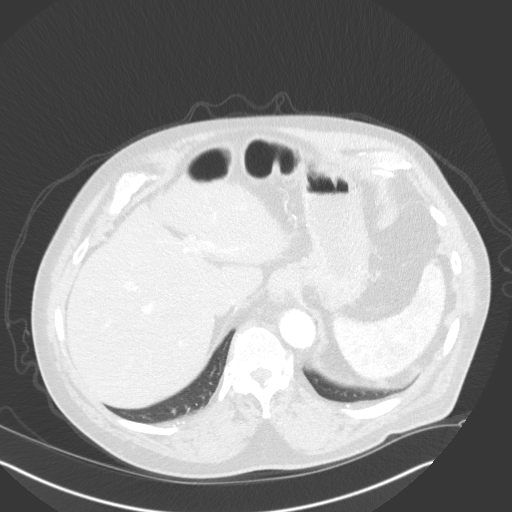
[im 40/119  soft-tissue]
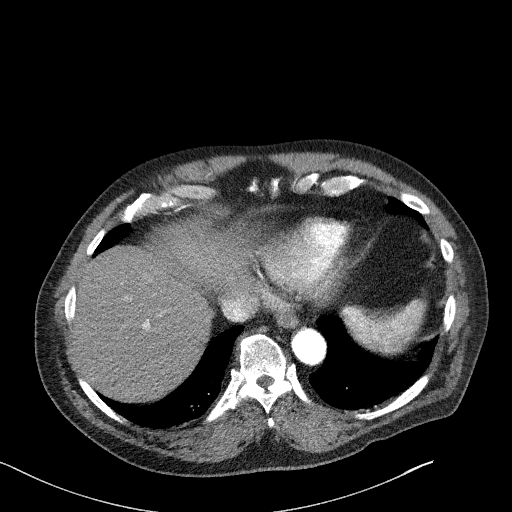
[im 50/119  lung]
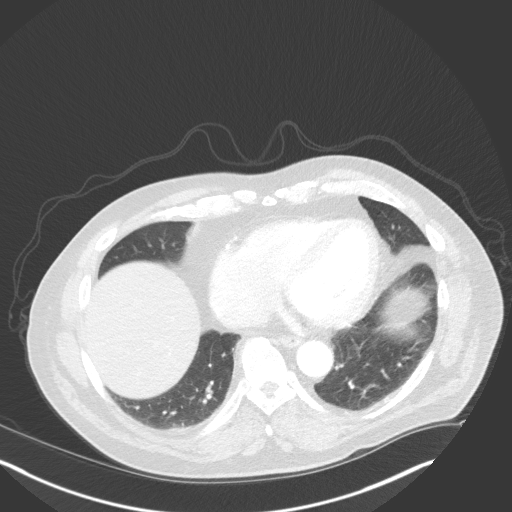
[im 60/119  soft-tissue]
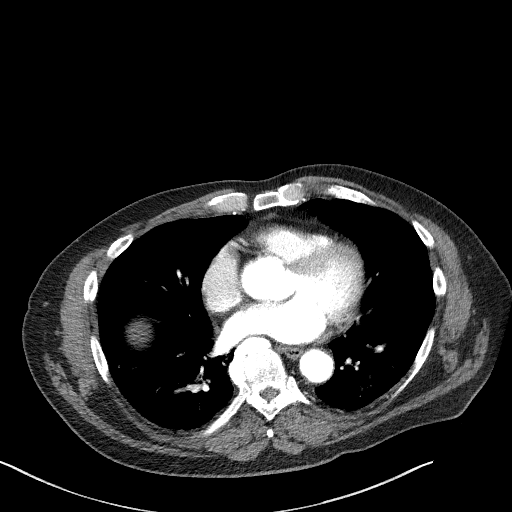
[im 69/119  lung]
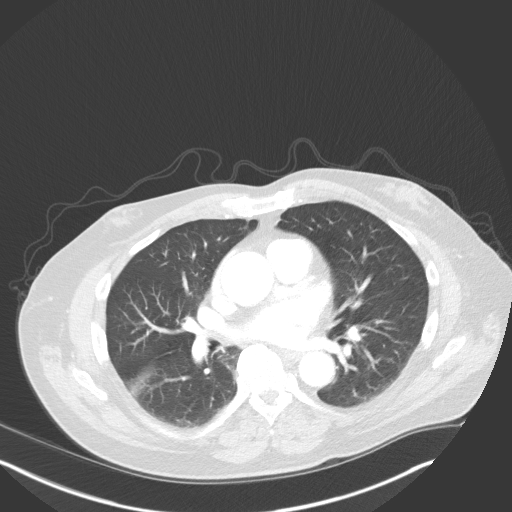
[im 79/119  soft-tissue]
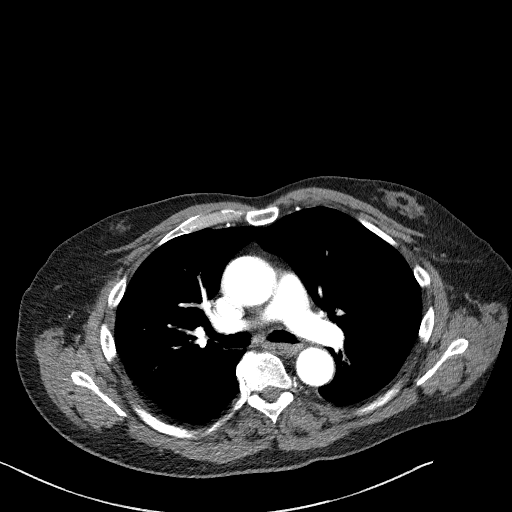
[im 89/119  lung]
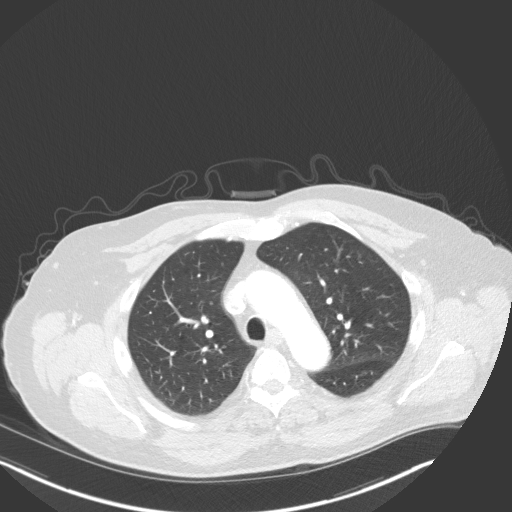
[im 99/119  soft-tissue]
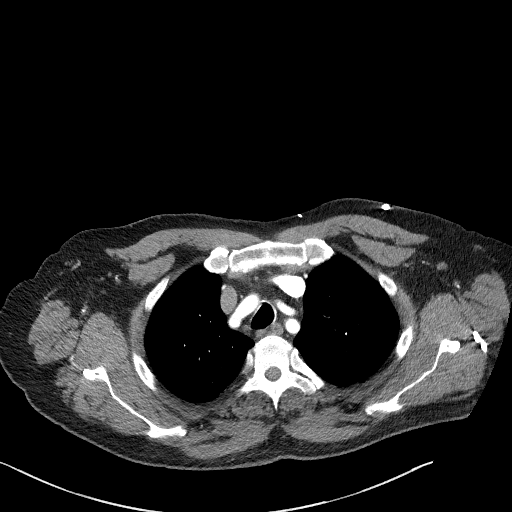
[im 109/119  lung]
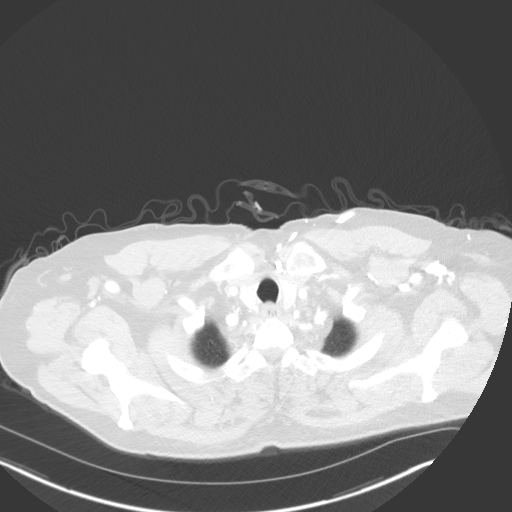

[Series 9: lung · axial · 0.83mm/px · z∈[+42,+82]mm · 2 of 178 slices shown]
[im 20/178  soft-tissue]
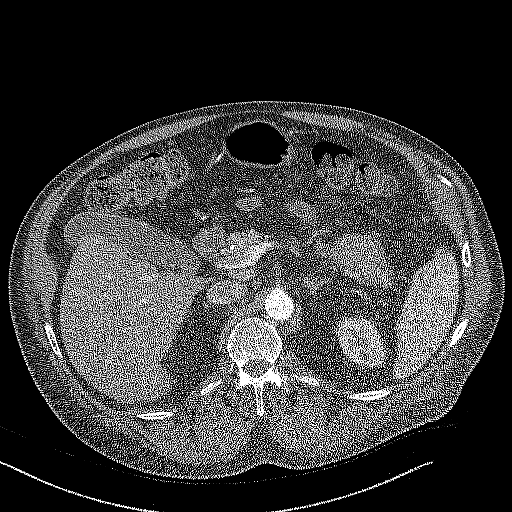
[im 40/178  soft-tissue]
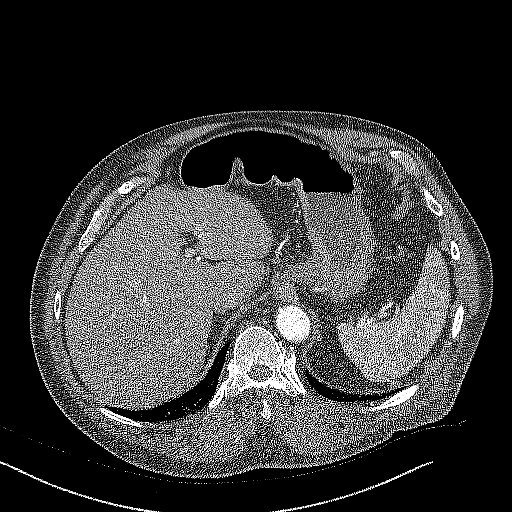

[Series 10: coronals · coronal · 0.72mm/px · 3 of 161 slices shown]
[im 41/161  soft-tissue]
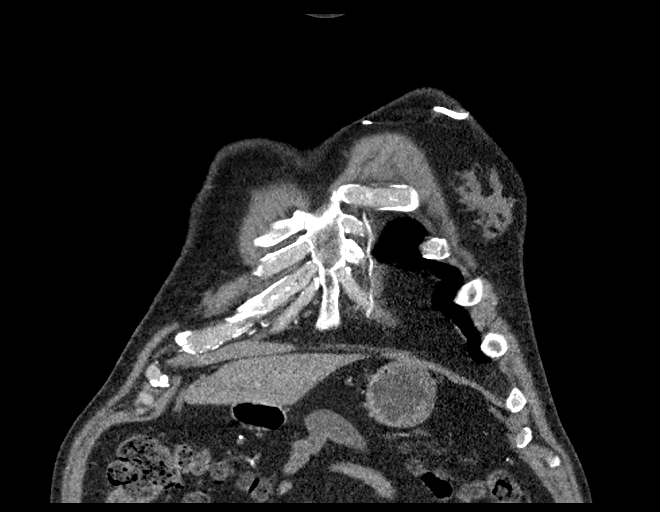
[im 81/161  soft-tissue]
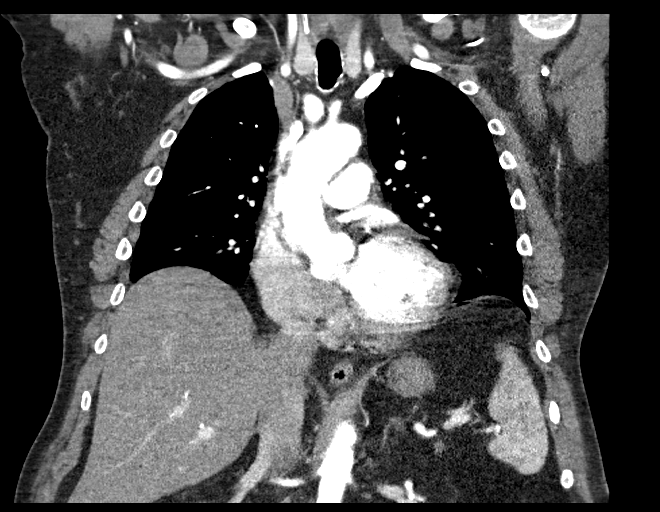
[im 121/161  soft-tissue]
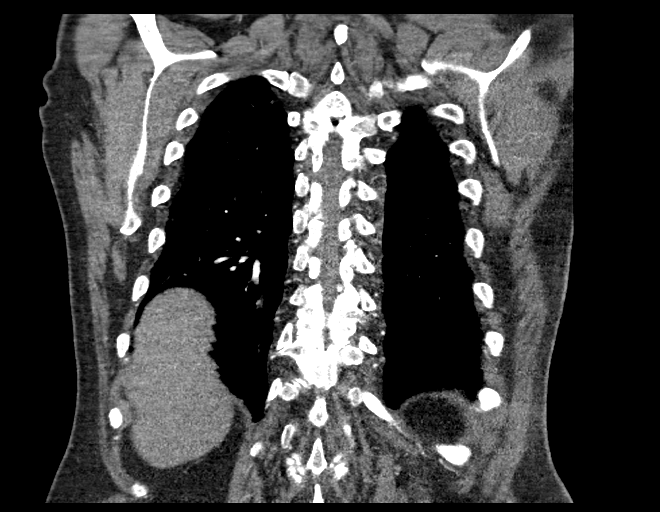

[16 of 46 positions shown; findings below may reference images not displayed]

RADIATION DOSE REDUCTION: This exam was performed according to the
departmental dose-optimization program which includes automated
exposure control, adjustment of the mA and/or kV according to
patient size and/or use of iterative reconstruction technique.

CONTRAST:  100mL OMNIPAQUE IOHEXOL 350 MG/ML SOLN
FINDINGS: Vascular Findings:

Stable fusiform aneurysmal dilatation of the ascending thoracic
aorta with measurements as follows.

The thoracic aorta tapers to a normal caliber at the level of the
aortic arch. There is a minimal amount of calcified atherosclerotic
plaque involving the aortic arch, not resulting in a hemodynamically
significant stenosis. The descending thoracic aorta is of normal
caliber and widely patent without hemodynamically significant
narrowing. No evidence of thoracic aortic dissection or perivascular
stranding on this nongated examination

Conventional configuration of the aortic arch. The branch vessels of
the aortic arch appear patent throughout their imaged courses.

Borderline cardiomegaly. Trace amount of pericardial fluid,
presumably physiologic. Coronary artery calcifications.

Although this examination was not tailored for the evaluation the
pulmonary arteries, there are no discrete filling defects within the
central pulmonary arterial tree to suggest central pulmonary
embolism. Normal caliber of the main pulmonary artery.

There is narrowing of the left subclavian vein at the level of the
thoracic inlet with associated mildly hypertrophied left chest wall
and supraclavicular venous collaterals, potentially accentuated due
to positioning of the patient's arms above his head, similar to the
[DATE] examination, of uncertain though doubtful clinical
significance.

-------------------------------------------------------------

Thoracic aortic measurements:

SINOTUBULAR JUNCTION: 36 mm as measured in greatest oblique short
axis coronal dimension.

PROXIMAL ASCENDING THORACIC AORTA: 43 mm as measured in greatest
oblique short axis axial dimension at the level of the main
pulmonary artery (axial image 45, series 8) and approximately 43 mm
in greatest oblique short axis coronal diameter (coronal image 75,
series 10), unchanged compared to the [DATE] examination

AORTIC ARCH: 28 mm as measured in greatest oblique short axis
sagittal dimension.

PROXIMAL DESCENDING THORACIC AORTA: 32 mm as measured in greatest
oblique short axis axial dimension at the level of the main
pulmonary artery.

DISTAL DESCENDING THORACIC AORTA: 27 mm as measured in greatest
oblique short axis axial dimension at the level of the diaphragmatic
hiatus.

Review of the MIP images confirms the above findings.

-------------------------------------------------------------

Non-Vascular Findings:.

Mediastinum/Lymph Nodes: No bulky mediastinal, hilar or axillary
lymphadenopathy.

Lungs/Pleura: Minimal dependent subpleural ground-glass atelectasis.
Minimal subsegmental atelectasis involving the right lower lobe. No
discrete focal airspace opacities. No pleural effusion or
pneumothorax. No discrete pulmonary nodules.

Upper abdomen: Limited early arterial phase evaluation of the upper
abdomen demonstrates diffuse decreased attenuation of the hepatic
parenchyma suggestive of hepatic steatosis

Musculoskeletal: No acute or aggressive osseous abnormalities.
Stigmata of dish within mid and caudal aspects of the thoracic
spine. Rather significant though grossly symmetric bilateral
gynecomastia, progressed compared to the [DATE] examination. Normal
appearance of the thyroid gland.
IMPRESSION: 1. Stable mild fusiform aneurysmal dilatation of the ascending
thoracic aorta measuring 43 mm in diameter, unchanged compared to
the [DATE] examination. Recommend annual imaging followup by CTA or
MRA. This recommendation follows 0959
ACCF/AHA/AATS/ACR/ASA/SCA/LEONILLO/KEREM/AUAD/THOMAS Guidelines for the
Diagnosis and Management of Patients with Thoracic Aortic Disease.
Circulation. 0959; 121: E266-e369. Aortic aneurysm NOS ([id number])
2. Coronary calcifications.  Aortic Atherosclerosis ([id number]).

## 2022-12-10 IMAGING — CR DG CHEST 2V
2 series · 2 of 2 positions shown · non-contrast
Comparison: 03/22/2018

CLINICAL DATA: Presyncope

EXAM:
CHEST - 2 VIEW

[x chest ap]
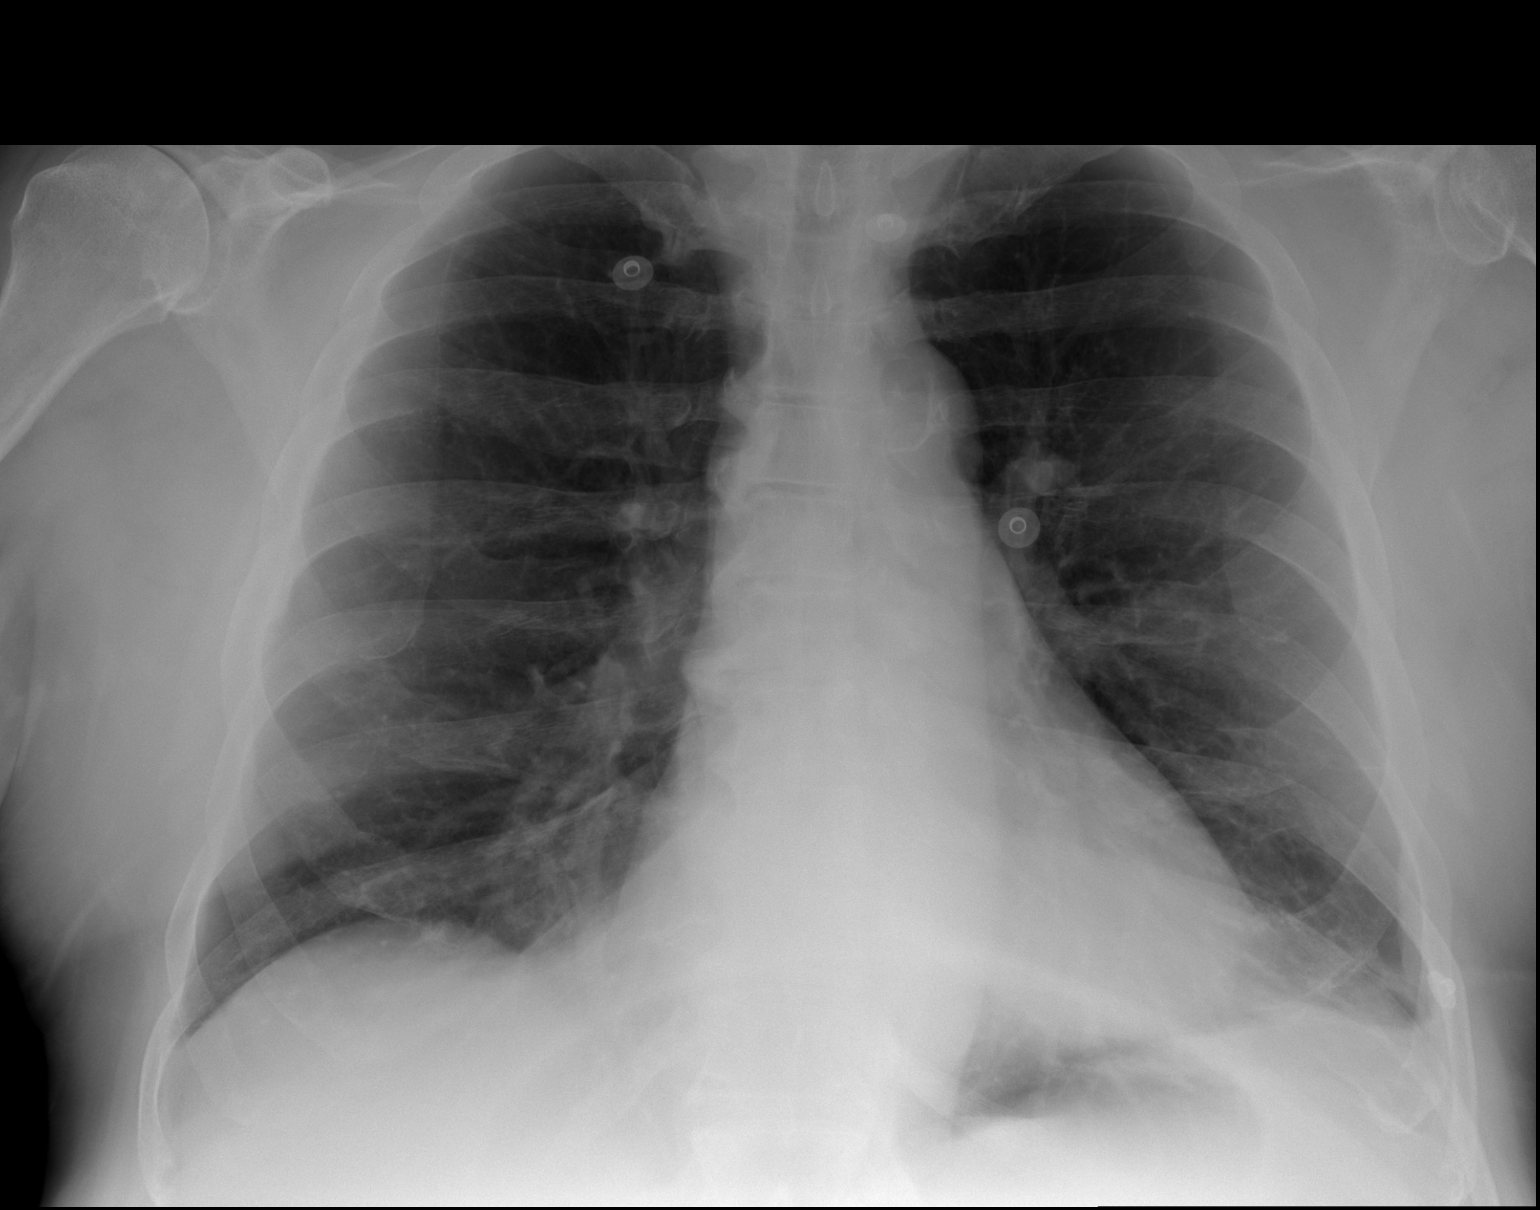

[w chest lat]
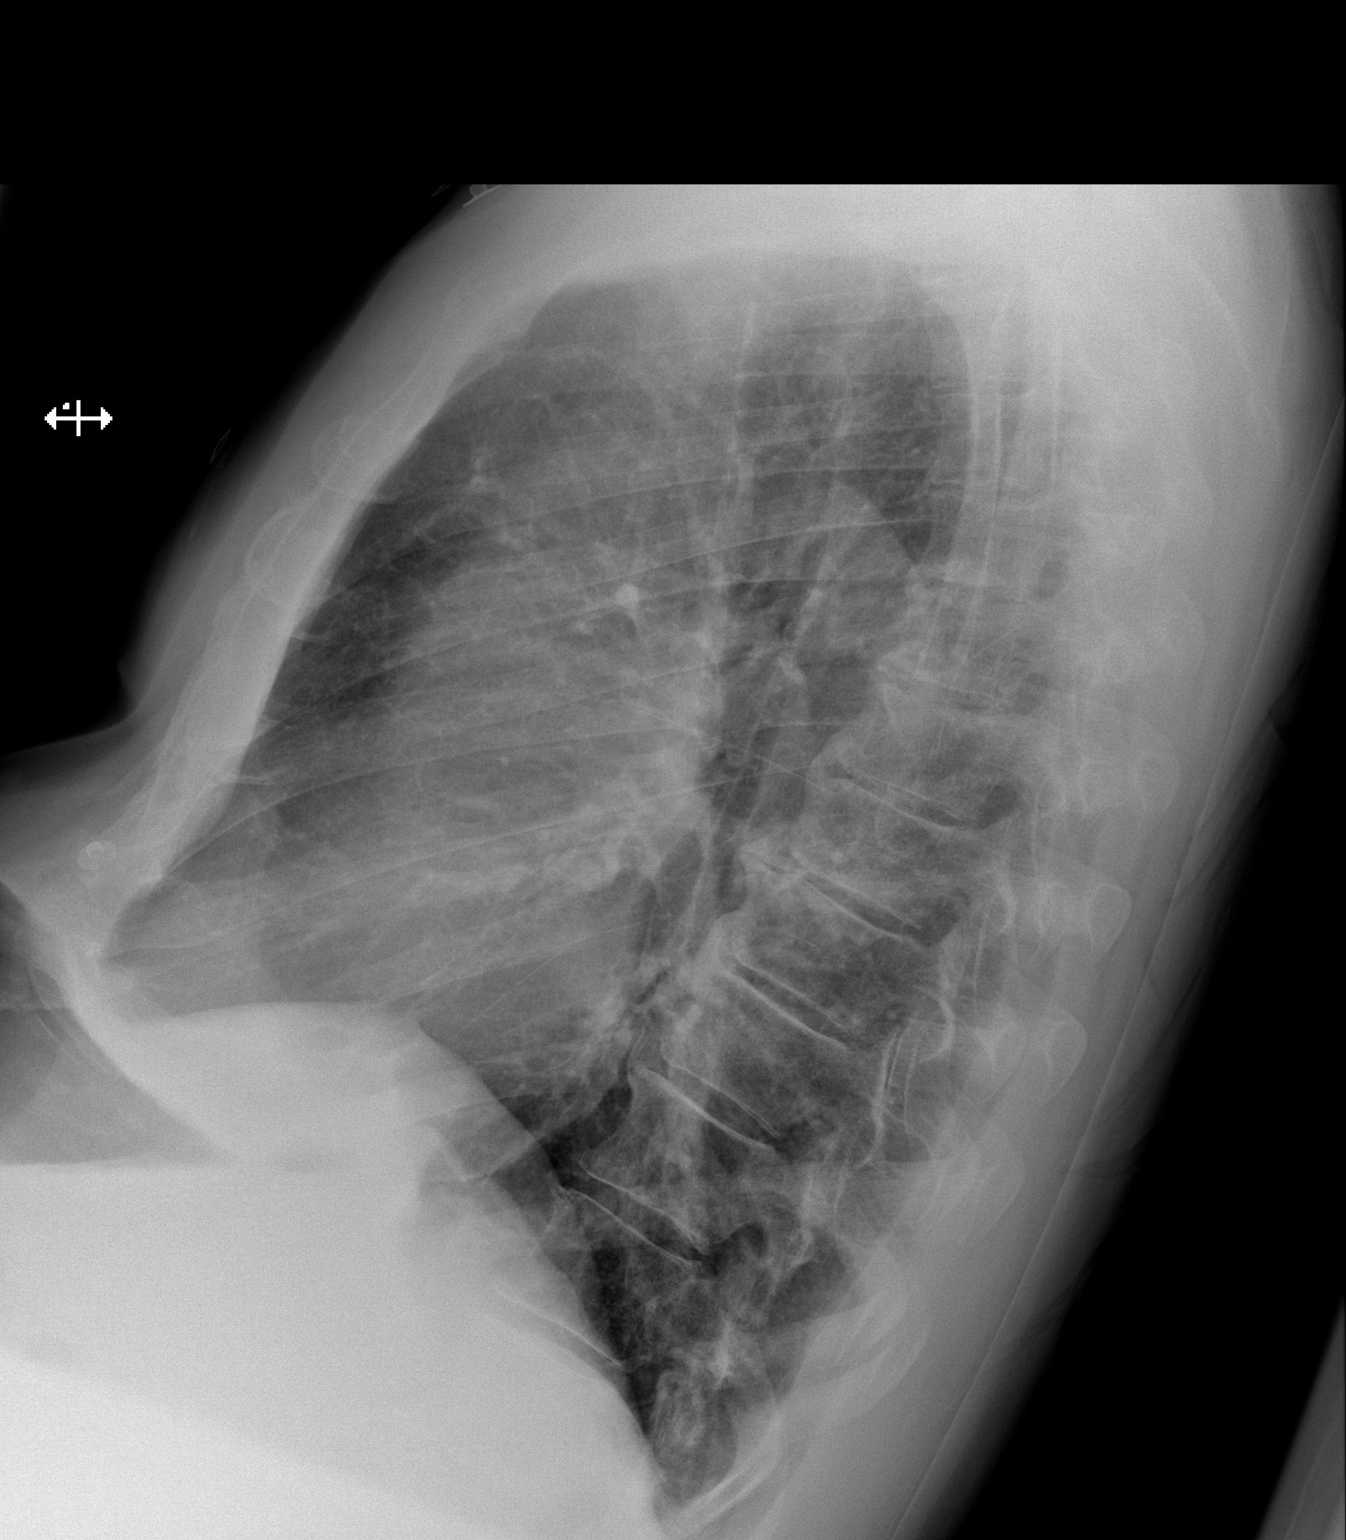

[2 of 2 positions shown; findings below may reference images not displayed]

FINDINGS: The lungs are symmetrically well expanded. No confluent pulmonary
infiltrate. Tiny left pleural effusion. No pneumothorax. Cardiac
size within normal limits. Pulmonary vascularity is normal. No acute
bone abnormality peer
IMPRESSION: Tiny left pleural effusion.

## 2023-01-02 ENCOUNTER — Ambulatory Visit (INDEPENDENT_AMBULATORY_CARE_PROVIDER_SITE_OTHER): Payer: Medicare Other | Admitting: Internal Medicine

## 2023-01-02 ENCOUNTER — Encounter: Payer: Self-pay | Admitting: Internal Medicine

## 2023-01-02 VITALS — BP 130/80 | HR 84 | Temp 98.6°F | Ht 73.0 in | Wt 240.0 lb

## 2023-01-02 DIAGNOSIS — I10 Essential (primary) hypertension: Secondary | ICD-10-CM

## 2023-01-02 DIAGNOSIS — E119 Type 2 diabetes mellitus without complications: Secondary | ICD-10-CM

## 2023-01-02 DIAGNOSIS — E559 Vitamin D deficiency, unspecified: Secondary | ICD-10-CM

## 2023-01-02 DIAGNOSIS — F5104 Psychophysiologic insomnia: Secondary | ICD-10-CM

## 2023-01-02 DIAGNOSIS — M791 Myalgia, unspecified site: Secondary | ICD-10-CM

## 2023-01-02 MED ORDER — OXYCODONE HCL 5 MG PO TABS: 10.0000 mg | ORAL_TABLET | Freq: Four times a day (QID) | ORAL | 0 refills | Status: DC | PRN

## 2023-01-02 MED ORDER — FENTANYL 25 MCG/HR TD PT72: 1.0000 | MEDICATED_PATCH | TRANSDERMAL | 0 refills | Status: DC

## 2023-01-02 NOTE — Progress Notes (Signed)
Subjective:  Patient ID: Lee Owens, male    DOB: 1950/08/14  Age: 72 y.o. MRN: [id number]  CC: Follow-up (3 mnth f/u)   HPI Jasten Guyette presents for LBP, myalgias, depression  Outpatient Medications Prior to Visit  Medication Sig Dispense Refill   allopurinol (ZYLOPRIM) 300 MG tablet TAKE 1 TABLET(300 MG) BY MOUTH DAILY 90 tablet 3   Cholecalciferol (VITAMIN D-3) 5000 UNITS TABS Take 5,000 Units by mouth daily.     Coenzyme Q10 (COQ10) 400 MG CAPS 1 po qd 100 capsule 3   diazepam (VALIUM) 10 MG tablet TAKE 1/2 TO 1 TABLET(5 TO 10 MG) BY MOUTH EVERY 8 HOURS AS NEEDED FOR ANXIETY OR MUSCLE SPASMS 90 tablet 3   DULoxetine (CYMBALTA) 60 MG capsule TAKE 1 CAPSULE(60 MG) BY MOUTH TWICE DAILY 60 capsule 5   finasteride (PROSCAR) 5 MG tablet Take 5 mg by mouth daily.     furosemide (LASIX) 40 MG tablet Take 0.5-1 tablets (20-40 mg total) by mouth daily as needed. 30 tablet 5   gabapentin (NEURONTIN) 300 MG capsule TAKE 1 CAPSULE(300 MG) BY MOUTH THREE TIMES DAILY 270 capsule 1   losartan (COZAAR) 25 MG tablet Take 12.5 mg by mouth at bedtime.     lovastatin (MEVACOR) 40 MG tablet TAKE 1 TABLET(40 MG) BY MOUTH AT BEDTIME 90 tablet 3   NARCAN 4 MG/0.1ML LIQD nasal spray kit Place 0.1 sprays (0.4 mg total) into the nose as needed. 2 each 3   tamsulosin (FLOMAX) 0.4 MG CAPS capsule Take 1 capsule by mouth daily.  2   temazepam (RESTORIL) 30 MG capsule TAKE 1 TO 2 CAPSULES BY MOUTH AT BEDTIME AS NEEDED FOR SLEEP 180 capsule 1   vitamin B-12 (CYANOCOBALAMIN) 1000 MCG tablet Take 2,000 mcg by mouth daily.     fentaNYL (DURAGESIC) 25 MCG/HR Place 1 patch onto the skin every other day. 15 patch 0   fentaNYL (DURAGESIC) 25 MCG/HR Place 1 patch onto the skin every other day. 15 patch 0   fentaNYL (DURAGESIC) 25 MCG/HR Place 1 patch onto the skin every other day. 15 patch 0   oxyCODONE (OXY IR/ROXICODONE) 5 MG immediate release tablet Take 2 tablets (10 mg total) by mouth every 6 (six) hours as  needed for severe pain (chronic back pain). 240 tablet 0   oxyCODONE (OXY IR/ROXICODONE) 5 MG immediate release tablet Take 2 tablets (10 mg total) by mouth every 6 (six) hours as needed for severe pain (chronic back pain). 240 tablet 0   oxyCODONE (OXY IR/ROXICODONE) 5 MG immediate release tablet Take 2 tablets (10 mg total) by mouth every 6 (six) hours as needed for severe pain (chronic back pain). 240 tablet 0   Semaglutide, 1 MG/DOSE, 4 MG/3ML SOPN Inject 1 mg as directed once a week. 3 mL 3   amoxicillin-clavulanate (AUGMENTIN) 875-125 MG tablet Take 1 tablet by mouth 2 (two) times daily. (Patient not taking: Reported on 10/02/2022) 20 tablet 0   No facility-administered medications prior to visit.    ROS: Review of Systems  Constitutional:  Positive for fatigue. Negative for appetite change and unexpected weight change.  HENT:  Negative for congestion, nosebleeds, sneezing, sore throat and trouble swallowing.   Eyes:  Negative for itching and visual disturbance.  Respiratory:  Negative for cough.   Cardiovascular:  Negative for chest pain, palpitations and leg swelling.  Gastrointestinal:  Negative for abdominal distention, blood in stool, diarrhea and nausea.  Genitourinary:  Negative for frequency and hematuria.  Musculoskeletal:  Positive for arthralgias, back pain, gait problem, myalgias and neck stiffness. Negative for joint swelling and neck pain.  Skin:  Negative for rash.  Neurological:  Negative for dizziness, tremors, speech difficulty and weakness.  Psychiatric/Behavioral:  Positive for decreased concentration, dysphoric mood and sleep disturbance. Negative for agitation and suicidal ideas. The patient is nervous/anxious.     Objective:  BP 130/80 (BP Location: Left Arm, Patient Position: Sitting, Cuff Size: Normal)   Pulse 84   Temp 98.6 F (37 C) (Oral)   Ht 6\' 1"  (1.854 m)   Wt 240 lb (108.9 kg)   SpO2 92%   BMI 31.66 kg/m   BP Readings from Last 3 Encounters:   01/02/23 130/80  10/02/22 122/80  07/05/22 130/86    Wt Readings from Last 3 Encounters:  01/02/23 240 lb (108.9 kg)  10/02/22 243 lb (110.2 kg)  07/05/22 244 lb (110.7 kg)    Physical Exam Constitutional:      General: He is not in acute distress.    Appearance: He is well-developed. He is obese.     Comments: NAD  Eyes:     Conjunctiva/sclera: Conjunctivae normal.     Pupils: Pupils are equal, round, and reactive to light.  Neck:     Thyroid: No thyromegaly.     Vascular: No JVD.  Cardiovascular:     Rate and Rhythm: Normal rate and regular rhythm.     Heart sounds: Normal heart sounds. No murmur heard.    No friction rub. No gallop.  Pulmonary:     Effort: Pulmonary effort is normal. No respiratory distress.     Breath sounds: Normal breath sounds. No wheezing or rales.  Chest:     Chest wall: No tenderness.  Abdominal:     General: Bowel sounds are normal. There is no distension.     Palpations: Abdomen is soft. There is no mass.     Tenderness: There is no abdominal tenderness. There is no guarding or rebound.  Musculoskeletal:        General: No tenderness. Normal range of motion.     Cervical back: Normal range of motion.  Lymphadenopathy:     Cervical: No cervical adenopathy.  Skin:    General: Skin is warm and dry.     Findings: No rash.  Neurological:     Mental Status: He is alert and oriented to person, place, and time.     Cranial Nerves: No cranial nerve deficit.     Motor: Weakness present. No abnormal muscle tone.     Coordination: Coordination normal.     Gait: Gait abnormal.     Deep Tendon Reflexes: Reflexes are normal and symmetric.  Psychiatric:        Behavior: Behavior normal.        Thought Content: Thought content normal.        Judgment: Judgment normal.   Tearful, labile mood  Lab Results  Component Value Date   WBC 7.0 05/30/2021   HGB 14.5 05/30/2021   HCT 42.7 05/30/2021   PLT 211.0 05/30/2021   GLUCOSE 103 (H) 11/22/2022    CHOL 156 12/14/2020   TRIG 102.0 12/14/2020   HDL 52.00 12/14/2020   LDLCALC 83 12/14/2020   ALT 30 05/30/2021   AST 22 05/30/2021   NA 138 11/22/2022   K 4.1 11/22/2022   CL 100 11/22/2022   CREATININE 1.10 11/22/2022   BUN 8 11/22/2022   CO2 24 11/22/2022   TSH 1.00 12/14/2020  PSA 0.76 06/29/2016   INR 0.96 06/30/2013   HGBA1C 5.7 12/14/2020    CT ANGIO CHEST AORTA W/CM & OR WO/CM  Result Date: 11/10/2022 CLINICAL DATA:  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 EXAM: CT ANGIOGRAPHY CHEST WITH CONTRAST TECHNIQUE: Multidetector CT imaging of the chest was performed using the standard protocol during bolus administration of intravenous contrast. Multiplanar CT image reconstructions and MIPs were obtained to evaluate the vascular anatomy. RADIATION DOSE REDUCTION: This exam was performed according to the departmental dose-optimization program which includes automated exposure control, adjustment of the mA and/or kV according to patient size and/or use of iterative reconstruction technique. CONTRAST:  75mL OMNIPAQUE IOHEXOL 350 MG/ML SOLN COMPARISON:  CTA chest 05/04/2022 FINDINGS: Cardiovascular: No change in caliber of the thoracic aorta. The ascending thoracic aorta measures up to 43 mm in greatest diameter. This is unchanged from 05/04/2022 using similar measuring technique. Aortic and coronary artery calcification. No pericardial effusion. Mediastinum/Nodes: Trachea and esophagus are unremarkable. No adenopathy. Lungs/Pleura: No focal consolidation, pleural effusion, or pneumothorax. Upper Abdomen: No acute abnormality. Musculoskeletal: Bilateral gynecomastia.  No acute fracture. Review of the MIP images confirms the above findings. IMPRESSION: 1. No change in caliber of the thoracic aorta. The ascending thoracic aorta measures up to 43 mm in greatest diameter using similar measuring technique. Recommend annual imaging followup by CTA or MRA. This recommendation  follows 2010 ACCF/AHA/AATS/ACR/ASA/SCA/SCAI/SIR/STS/SVM Guidelines for the Diagnosis and Management of Patients with Thoracic Aortic Disease. Circulation. 2010; 121: V409-W119. Aortic aneurysm NOS (ICD10-I71.9) Aortic Atherosclerosis (ICD10-I70.0). Electronically Signed   By: Minerva Fester M.D.   On: 11/10/2022 20:21    Assessment & Plan:   Problem List Items Addressed This Visit     Vitamin D deficiency    Refractory - weekly Vit d 50000 iu      Myalgia    Continue on: Oxy 10 mg qid prn Fentanyl q 48 h Narcane prn  Potential benefits of a long term opioids use as well as potential risks (i.e. addiction risk, apnea etc) and complications (i.e. Somnolence, constipation and others) were explained to the patient and were aknowledged      Relevant Orders   CBC with Differential/Platelet   Lipid panel   TSH   Urinalysis   Diabetes mellitus, type 2 (HCC) - Primary    Monitor Hgb A1c      Relevant Orders   Hemoglobin A1c   Urine microalbumin-creatinine with uACR   CBC with Differential/Platelet   Hypertension    NAS      Relevant Orders   CBC with Differential/Platelet   Lipid panel   TSH   Urinalysis   Insomnia    Chronic  On Temazepam  Potential benefits of a long term benzodiazepines  use as well as potential risks  and complications were explained to the patient and were aknowledged.          Meds ordered this encounter  Medications   DISCONTD: fentaNYL (DURAGESIC) 25 MCG/HR    Sig: Place 1 patch onto the skin every other day.    Dispense:  15 patch    Refill:  0    Please fill on or after 01/07/23   DISCONTD: oxyCODONE (OXY IR/ROXICODONE) 5 MG immediate release tablet    Sig: Take 2 tablets (10 mg total) by mouth every 6 (six) hours as needed for severe pain (chronic back pain).    Dispense:  240 tablet    Refill:  0    Please fill on or  after 01/07/23   DISCONTD: fentaNYL (DURAGESIC) 25 MCG/HR    Sig: Place 1 patch onto the skin every other day.     Dispense:  15 patch    Refill:  0    Please fill on or after 02/06/23   DISCONTD: fentaNYL (DURAGESIC) 25 MCG/HR    Sig: Place 1 patch onto the skin every other day.    Dispense:  15 patch    Refill:  0    Please fill on or after 03/08/23   DISCONTD: oxyCODONE (OXY IR/ROXICODONE) 5 MG immediate release tablet    Sig: Take 2 tablets (10 mg total) by mouth every 6 (six) hours as needed for severe pain (chronic back pain).    Dispense:  240 tablet    Refill:  0    Please fill on or after 02/06/23   DISCONTD: oxyCODONE (OXY IR/ROXICODONE) 5 MG immediate release tablet    Sig: Take 2 tablets (10 mg total) by mouth every 6 (six) hours as needed for severe pain (chronic back pain).    Dispense:  240 tablet    Refill:  0    Please fill on or after 03/08/23.   fentaNYL (DURAGESIC) 25 MCG/HR    Sig: Place 1 patch onto the skin every other day.    Dispense:  15 patch    Refill:  0    Please fill on or after 01/07/23   fentaNYL (DURAGESIC) 25 MCG/HR    Sig: Place 1 patch onto the skin every other day.    Dispense:  15 patch    Refill:  0    Please fill on or after 02/06/23   fentaNYL (DURAGESIC) 25 MCG/HR    Sig: Place 1 patch onto the skin every other day.    Dispense:  15 patch    Refill:  0    Please fill on or after 03/08/23   oxyCODONE (OXY IR/ROXICODONE) 5 MG immediate release tablet    Sig: Take 2 tablets (10 mg total) by mouth every 6 (six) hours as needed for severe pain (chronic back pain).    Dispense:  240 tablet    Refill:  0    Please fill on or after 01/07/23   oxyCODONE (OXY IR/ROXICODONE) 5 MG immediate release tablet    Sig: Take 2 tablets (10 mg total) by mouth every 6 (six) hours as needed for severe pain (chronic back pain).    Dispense:  240 tablet    Refill:  0    Please fill on or after 02/06/23   oxyCODONE (OXY IR/ROXICODONE) 5 MG immediate release tablet    Sig: Take 2 tablets (10 mg total) by mouth every 6 (six) hours as needed for severe pain (chronic back  pain).    Dispense:  240 tablet    Refill:  0    Please fill on or after 03/08/23.      Follow-up: Return in about 3 months (around 04/04/2023) for a follow-up visit.  Sonda Primes, MD

## 2023-01-02 NOTE — Assessment & Plan Note (Signed)
Monitor HgbA1c

## 2023-01-02 NOTE — Assessment & Plan Note (Signed)
Continue on: Oxy 10 mg qid prn Fentanyl q 48 h Narcane prn  Potential benefits of a long term opioids use as well as potential risks (i.e. addiction risk, apnea etc) and complications (i.e. Somnolence, constipation and others) were explained to the patient and were aknowledged

## 2023-01-02 NOTE — Assessment & Plan Note (Signed)
Chronic On Temazepam  Potential benefits of a long term benzodiazepines  use as well as potential risks  and complications were explained to the patient and were aknowledged.

## 2023-01-02 NOTE — Assessment & Plan Note (Signed)
Refractory - weekly Vit d 50000 iu

## 2023-01-02 NOTE — Assessment & Plan Note (Signed)
Continue with B12 

## 2023-01-02 NOTE — Assessment & Plan Note (Signed)
NAS 

## 2023-01-12 ENCOUNTER — Encounter: Payer: Self-pay | Admitting: Internal Medicine

## 2023-01-17 ENCOUNTER — Other Ambulatory Visit: Payer: Self-pay | Admitting: Internal Medicine

## 2023-01-17 MED ORDER — OXYCODONE HCL 5 MG PO TABS
10.0000 mg | ORAL_TABLET | Freq: Four times a day (QID) | ORAL | 0 refills | Status: DC | PRN
Start: 2023-01-17 — End: 2023-04-02

## 2023-01-31 ENCOUNTER — Encounter: Payer: Self-pay | Admitting: Internal Medicine

## 2023-01-31 DIAGNOSIS — R972 Elevated prostate specific antigen [PSA]: Secondary | ICD-10-CM

## 2023-02-02 DIAGNOSIS — Z23 Encounter for immunization: Secondary | ICD-10-CM | POA: Diagnosis not present

## 2023-02-03 ENCOUNTER — Other Ambulatory Visit: Payer: Self-pay | Admitting: Internal Medicine

## 2023-02-28 NOTE — Addendum Note (Signed)
Addended by: Delsa Grana R on: 02/28/2023 02:43 PM   Modules accepted: Orders

## 2023-03-22 ENCOUNTER — Encounter: Payer: Self-pay | Admitting: Internal Medicine

## 2023-03-23 ENCOUNTER — Other Ambulatory Visit: Payer: Self-pay | Admitting: Internal Medicine

## 2023-03-29 ENCOUNTER — Other Ambulatory Visit: Payer: Medicare Other

## 2023-03-29 DIAGNOSIS — M791 Myalgia, unspecified site: Secondary | ICD-10-CM

## 2023-03-29 DIAGNOSIS — E119 Type 2 diabetes mellitus without complications: Secondary | ICD-10-CM

## 2023-03-29 DIAGNOSIS — I10 Essential (primary) hypertension: Secondary | ICD-10-CM | POA: Diagnosis not present

## 2023-03-29 LAB — CBC WITH DIFFERENTIAL/PLATELET
Basophils Absolute: 0.1 10*3/uL (ref 0.0–0.1)
Basophils Relative: 0.8 % (ref 0.0–3.0)
Eosinophils Absolute: 0.2 10*3/uL (ref 0.0–0.7)
Eosinophils Relative: 2.1 % (ref 0.0–5.0)
HCT: 47.2 % (ref 39.0–52.0)
Hemoglobin: 16.1 g/dL (ref 13.0–17.0)
Lymphocytes Relative: 20.3 % (ref 12.0–46.0)
Lymphs Abs: 1.5 10*3/uL (ref 0.7–4.0)
MCHC: 34.1 g/dL (ref 30.0–36.0)
MCV: 94.3 fL (ref 78.0–100.0)
Monocytes Absolute: 0.5 10*3/uL (ref 0.1–1.0)
Monocytes Relative: 6.4 % (ref 3.0–12.0)
Neutro Abs: 5.2 10*3/uL (ref 1.4–7.7)
Neutrophils Relative %: 70.4 % (ref 43.0–77.0)
Platelets: 243 10*3/uL (ref 150.0–400.0)
RBC: 5.01 Mil/uL (ref 4.22–5.81)
RDW: 13.6 % (ref 11.5–15.5)
WBC: 7.4 10*3/uL (ref 4.0–10.5)

## 2023-03-29 LAB — MICROALBUMIN / CREATININE URINE RATIO
Creatinine,U: 77.2 mg/dL
Microalb Creat Ratio: 2.3 mg/g (ref 0.0–30.0)
Microalb, Ur: 1.8 mg/dL (ref 0.0–1.9)

## 2023-03-29 LAB — LIPID PANEL
Cholesterol: 152 mg/dL (ref 0–200)
HDL: 49.3 mg/dL (ref >39.00)
LDL Cholesterol: 82 mg/dL (ref 0–99)
NonHDL: 103.12
Total CHOL/HDL Ratio: 3
Triglycerides: 104 mg/dL (ref 0.0–149.0)
VLDL: 20.8 mg/dL (ref 0.0–40.0)

## 2023-03-29 LAB — TSH: TSH: 1.38 u[IU]/mL (ref 0.35–5.50)

## 2023-03-29 LAB — URINALYSIS
Bilirubin Urine: NEGATIVE
Hgb urine dipstick: NEGATIVE
Ketones, ur: NEGATIVE
Leukocytes,Ua: NEGATIVE
Nitrite: NEGATIVE
Specific Gravity, Urine: 1.01 (ref 1.000–1.030)
Total Protein, Urine: NEGATIVE
Urine Glucose: NEGATIVE
Urobilinogen, UA: 0.2 (ref 0.0–1.0)
pH: 6 (ref 5.0–8.0)

## 2023-03-29 LAB — HEMOGLOBIN A1C: Hgb A1c MFr Bld: 6.2 % (ref 4.6–6.5)

## 2023-03-30 ENCOUNTER — Encounter: Payer: Self-pay | Admitting: Internal Medicine

## 2023-04-02 ENCOUNTER — Other Ambulatory Visit: Payer: Self-pay | Admitting: Internal Medicine

## 2023-04-02 MED ORDER — OXYCODONE HCL 5 MG PO TABS: 10.0000 mg | ORAL_TABLET | Freq: Four times a day (QID) | ORAL | 0 refills | Status: DC | PRN

## 2023-04-04 ENCOUNTER — Encounter: Payer: Self-pay | Admitting: Internal Medicine

## 2023-04-04 ENCOUNTER — Ambulatory Visit: Payer: Medicare Other | Admitting: Internal Medicine

## 2023-04-04 VITALS — BP 138/88 | HR 74 | Temp 98.1°F | Ht 73.0 in

## 2023-04-04 DIAGNOSIS — E559 Vitamin D deficiency, unspecified: Secondary | ICD-10-CM | POA: Diagnosis not present

## 2023-04-04 DIAGNOSIS — G8929 Other chronic pain: Secondary | ICD-10-CM

## 2023-04-04 DIAGNOSIS — M7989 Other specified soft tissue disorders: Secondary | ICD-10-CM

## 2023-04-04 DIAGNOSIS — M5442 Lumbago with sciatica, left side: Secondary | ICD-10-CM

## 2023-04-04 DIAGNOSIS — E538 Deficiency of other specified B group vitamins: Secondary | ICD-10-CM | POA: Diagnosis not present

## 2023-04-04 DIAGNOSIS — M5441 Lumbago with sciatica, right side: Secondary | ICD-10-CM | POA: Diagnosis not present

## 2023-04-04 DIAGNOSIS — E119 Type 2 diabetes mellitus without complications: Secondary | ICD-10-CM

## 2023-04-04 MED ORDER — RELISTOR 150 MG PO TABS: ORAL_TABLET | ORAL | 3 refills | Status: DC

## 2023-04-04 MED ORDER — OXYCODONE HCL 10 MG PO TABS: 10.0000 mg | ORAL_TABLET | Freq: Four times a day (QID) | ORAL | 0 refills | Status: DC | PRN

## 2023-04-04 MED ORDER — DULOXETINE HCL 60 MG PO CPEP: 60.0000 mg | ORAL_CAPSULE | Freq: Two times a day (BID) | ORAL | 1 refills | Status: DC

## 2023-04-04 MED ORDER — FENTANYL 25 MCG/HR TD PT72: 1.0000 | MEDICATED_PATCH | TRANSDERMAL | 0 refills | Status: DC

## 2023-04-04 NOTE — Assessment & Plan Note (Signed)
 Monitor Hgb A1c Ozempic intolerant

## 2023-04-04 NOTE — Assessment & Plan Note (Signed)
On B12/B complex 

## 2023-04-04 NOTE — Assessment & Plan Note (Signed)
Continue on: Oxy 10 mg qid prn Fentanyl q 48 h Narcane prn  Potential benefits of a long term opioids use as well as potential risks (i.e. addiction risk, apnea etc) and complications (i.e. Somnolence, constipation and others) were explained to the patient and were aknowledged

## 2023-04-04 NOTE — Progress Notes (Signed)
 Subjective:  Patient ID: Lee Owens, male    DOB: 1950/08/27  Age: 73 y.o. MRN: [id number]  CC: Medical Management of Chronic Issues (3 month f/u)   HPI Lee Owens presents for chronic pain, depression. C/o constipation.  Outpatient Medications Prior to Visit  Medication Sig Dispense Refill   allopurinol  (ZYLOPRIM ) 300 MG tablet TAKE 1 TABLET(300 MG) BY MOUTH DAILY 90 tablet 3   Cholecalciferol  (VITAMIN D -3) 5000 UNITS TABS Take 5,000 Units by mouth daily.     Coenzyme Q10 (COQ10) 400 MG CAPS 1 po qd 100 capsule 3   diazepam  (VALIUM ) 10 MG tablet TAKE 1/2 TO 1 TABLET(5 TO 10 MG) BY MOUTH EVERY 8 HOURS AS NEEDED FOR ANXIETY OR MUSCLE SPASMS 90 tablet 3   finasteride  (PROSCAR ) 5 MG tablet Take 5 mg by mouth daily.     furosemide  (LASIX ) 40 MG tablet Take 0.5-1 tablets (20-40 mg total) by mouth daily as needed. 30 tablet 5   gabapentin  (NEURONTIN ) 300 MG capsule TAKE 1 CAPSULE(300 MG) BY MOUTH THREE TIMES DAILY 270 capsule 1   losartan  (COZAAR ) 25 MG tablet Take 12.5 mg by mouth at bedtime.     lovastatin  (MEVACOR ) 40 MG tablet TAKE 1 TABLET(40 MG) BY MOUTH AT BEDTIME 90 tablet 3   NARCAN  4 MG/0.1ML LIQD nasal spray kit Place 0.1 sprays (0.4 mg total) into the nose as needed. 2 each 3   tamsulosin  (FLOMAX ) 0.4 MG CAPS capsule Take 1 capsule by mouth daily.  2   temazepam  (RESTORIL ) 30 MG capsule TAKE 1 TO 2 CAPSULES BY MOUTH AT BEDTIME AS NEEDED FOR SLEEP 180 capsule 1   vitamin B-12 (CYANOCOBALAMIN ) 1000 MCG tablet Take 2,000 mcg by mouth daily.     DULoxetine  (CYMBALTA ) 60 MG capsule TAKE 1 CAPSULE(60 MG) BY MOUTH TWICE DAILY 60 capsule 5   fentaNYL  (DURAGESIC ) 25 MCG/HR Place 1 patch onto the skin every other day. 15 patch 0   fentaNYL  (DURAGESIC ) 25 MCG/HR Place 1 patch onto the skin every other day. 15 patch 0   fentaNYL  (DURAGESIC ) 25 MCG/HR Place 1 patch onto the skin every other day. 15 patch 0   oxyCODONE  (OXY IR/ROXICODONE ) 5 MG immediate release tablet Take 2 tablets (10  mg total) by mouth every 6 (six) hours as needed for severe pain (chronic back pain). 240 tablet 0   oxyCODONE  (OXY IR/ROXICODONE ) 5 MG immediate release tablet Take 2 tablets (10 mg total) by mouth every 6 (six) hours as needed for severe pain (chronic back pain). 240 tablet 0   oxyCODONE  (OXY IR/ROXICODONE ) 5 MG immediate release tablet Take 2 tablets (10 mg total) by mouth every 6 (six) hours as needed for severe pain (chronic back pain). 240 tablet 0   oxyCODONE  (ROXICODONE ) 5 MG immediate release tablet Take 2 tablets (10 mg total) by mouth every 6 (six) hours as needed for severe pain (pain score 7-10). 13 tablet 0   No facility-administered medications prior to visit.    ROS: Review of Systems  Constitutional:  Negative for appetite change, fatigue and unexpected weight change.  HENT:  Negative for congestion, nosebleeds, sneezing, sore throat and trouble swallowing.   Eyes:  Negative for itching and visual disturbance.  Respiratory:  Negative for cough.   Cardiovascular:  Negative for chest pain, palpitations and leg swelling.  Gastrointestinal:  Negative for abdominal distention, blood in stool, diarrhea and nausea.  Genitourinary:  Negative for frequency and hematuria.  Musculoskeletal:  Negative for back pain, gait problem, joint swelling  and neck pain.  Skin:  Negative for rash.  Neurological:  Negative for dizziness, tremors, speech difficulty and weakness.  Psychiatric/Behavioral:  Negative for agitation, dysphoric mood and sleep disturbance. The patient is not nervous/anxious.     Objective:  BP 138/88 (BP Location: Right Arm, Patient Position: Sitting, Cuff Size: Normal)   Pulse 74   Temp 98.1 F (36.7 C) (Oral)   Ht 6' 1 (1.854 m)   SpO2 92%   BMI 31.66 kg/m   BP Readings from Last 3 Encounters:  04/04/23 138/88  01/02/23 130/80  10/02/22 122/80    Wt Readings from Last 3 Encounters:  01/02/23 240 lb (108.9 kg)  10/02/22 243 lb (110.2 kg)  07/05/22 244 lb  (110.7 kg)    Physical Exam Constitutional:      General: He is not in acute distress.    Appearance: He is well-developed. He is obese.     Comments: NAD  Eyes:     Conjunctiva/sclera: Conjunctivae normal.     Pupils: Pupils are equal, round, and reactive to light.  Neck:     Thyroid : No thyromegaly.     Vascular: No JVD.  Cardiovascular:     Rate and Rhythm: Normal rate and regular rhythm.     Heart sounds: Normal heart sounds. No murmur heard.    No friction rub. No gallop.  Pulmonary:     Effort: Pulmonary effort is normal. No respiratory distress.     Breath sounds: Normal breath sounds. No wheezing or rales.  Chest:     Chest wall: No tenderness.  Abdominal:     General: Bowel sounds are normal. There is no distension.     Palpations: Abdomen is soft. There is no mass.     Tenderness: There is no abdominal tenderness. There is no guarding or rebound.  Musculoskeletal:        General: No tenderness. Normal range of motion.     Cervical back: Normal range of motion.  Lymphadenopathy:     Cervical: No cervical adenopathy.  Skin:    General: Skin is warm and dry.     Findings: No rash.  Neurological:     Mental Status: He is alert and oriented to person, place, and time.     Cranial Nerves: No cranial nerve deficit.     Motor: No abnormal muscle tone.     Coordination: Coordination normal.     Gait: Gait normal.     Deep Tendon Reflexes: Reflexes are normal and symmetric.  Psychiatric:        Behavior: Behavior normal.        Thought Content: Thought content normal.        Judgment: Judgment normal.     Lab Results  Component Value Date   WBC 7.4 03/29/2023   HGB 16.1 03/29/2023   HCT 47.2 03/29/2023   PLT 243.0 03/29/2023   GLUCOSE 103 (H) 11/22/2022   CHOL 152 03/29/2023   TRIG 104.0 03/29/2023   HDL 49.30 03/29/2023   LDLCALC 82 03/29/2023   ALT 30 05/30/2021   AST 22 05/30/2021   NA 138 11/22/2022   K 4.1 11/22/2022   CL 100 11/22/2022    CREATININE 1.10 11/22/2022   BUN 8 11/22/2022   CO2 24 11/22/2022   TSH 1.38 03/29/2023   PSA 0.76 06/29/2016   INR 0.96 06/30/2013   HGBA1C 6.2 03/29/2023   MICROALBUR 1.8 03/29/2023    CT ANGIO CHEST AORTA W/CM & OR WO/CM Result Date: 11/10/2022  CLINICAL DATA:  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 EXAM: CT ANGIOGRAPHY CHEST WITH CONTRAST TECHNIQUE: Multidetector CT imaging of the chest was performed using the standard protocol during bolus administration of intravenous contrast. Multiplanar CT image reconstructions and MIPs were obtained to evaluate the vascular anatomy. RADIATION DOSE REDUCTION: This exam was performed according to the departmental dose-optimization program which includes automated exposure control, adjustment of the mA and/or kV according to patient size and/or use of iterative reconstruction technique. CONTRAST:  75mL OMNIPAQUE  IOHEXOL  350 MG/ML SOLN COMPARISON:  CTA chest 05/04/2022 FINDINGS: Cardiovascular: No change in caliber of the thoracic aorta. The ascending thoracic aorta measures up to 43 mm in greatest diameter. This is unchanged from 05/04/2022 using similar measuring technique. Aortic and coronary artery calcification. No pericardial effusion. Mediastinum/Nodes: Trachea and esophagus are unremarkable. No adenopathy. Lungs/Pleura: No focal consolidation, pleural effusion, or pneumothorax. Upper Abdomen: No acute abnormality. Musculoskeletal: Bilateral gynecomastia.  No acute fracture. Review of the MIP images confirms the above findings. IMPRESSION: 1. No change in caliber of the thoracic aorta. The ascending thoracic aorta measures up to 43 mm in greatest diameter using similar measuring technique. Recommend annual imaging followup by CTA or MRA. This recommendation follows 2010 ACCF/AHA/AATS/ACR/ASA/SCA/SCAI/SIR/STS/SVM Guidelines for the Diagnosis and Management of Patients with Thoracic Aortic Disease. Circulation. 2010; 121: Z733-z630.  Aortic aneurysm NOS (ICD10-I71.9) Aortic Atherosclerosis (ICD10-I70.0). Electronically Signed   By: Norman Gatlin M.D.   On: 11/10/2022 20:21    Assessment & Plan:   Problem List Items Addressed This Visit     Vitamin D  deficiency - Primary   Refractory - weekly Vit d 50000 iu      Diabetes mellitus, type 2 (HCC)   Monitor Hgb A1c Ozempic  intolerant      RESOLVED: Vitamin D  deficiency disease   On Vit D      Vitamin B12 deficiency   On B12/B complex      Low back pain   Continue on: Oxy 10 mg qid prn Fentanyl  q 48 h Narcane prn  Potential benefits of a long term opioids use as well as potential risks (i.e. addiction risk, apnea etc) and complications (i.e. Somnolence, constipation and others) were explained to the patient and were aknowledged      Relevant Medications   fentaNYL  (DURAGESIC ) 25 MCG/HR   fentaNYL  (DURAGESIC ) 25 MCG/HR   fentaNYL  (DURAGESIC ) 25 MCG/HR   Oxycodone  HCl 10 MG TABS   Oxycodone  HCl 10 MG TABS   Oxycodone  HCl 10 MG TABS   Leg swelling   Compression socks         Meds ordered this encounter  Medications   fentaNYL  (DURAGESIC ) 25 MCG/HR    Sig: Place 1 patch onto the skin every other day.    Dispense:  15 patch    Refill:  0    Please fill on or after 04/07/23   fentaNYL  (DURAGESIC ) 25 MCG/HR    Sig: Place 1 patch onto the skin every other day.    Dispense:  15 patch    Refill:  0    Please fill on or after 05/07/23   fentaNYL  (DURAGESIC ) 25 MCG/HR    Sig: Place 1 patch onto the skin every other day.    Dispense:  15 patch    Refill:  0    Please fill on or after 06/06/23   Oxycodone  HCl 10 MG TABS    Sig: Take 1 tablet (10 mg total) by mouth every 6 (six) hours as needed.  Dispense:  120 tablet    Refill:  0    Please fill on or after 06/06/23   Oxycodone  HCl 10 MG TABS    Sig: Take 1 tablet (10 mg total) by mouth every 6 (six) hours as needed.    Dispense:  120 tablet    Refill:  0    Please fill on or after 05/07/23    Oxycodone  HCl 10 MG TABS    Sig: Take 1 tablet (10 mg total) by mouth every 6 (six) hours as needed.    Dispense:  120 tablet    Refill:  0    Please fill on or after 04/07/23   Methylnaltrexone  Bromide (RELISTOR ) 150 MG TABS    Sig: 1 po qd    Dispense:  90 tablet    Refill:  3   DULoxetine  (CYMBALTA ) 60 MG capsule    Sig: Take 1 capsule (60 mg total) by mouth 2 (two) times daily.    Dispense:  180 capsule    Refill:  1      Follow-up: Return in about 3 months (around 07/03/2023) for a follow-up visit.  Marolyn Noel, MD

## 2023-04-04 NOTE — Assessment & Plan Note (Signed)
 On Vit D

## 2023-04-04 NOTE — Assessment & Plan Note (Signed)
Refractory - weekly Vit d 50000 iu

## 2023-04-04 NOTE — Assessment & Plan Note (Signed)
 Compression socks

## 2023-04-10 ENCOUNTER — Telehealth: Payer: Self-pay

## 2023-04-10 ENCOUNTER — Other Ambulatory Visit (HOSPITAL_COMMUNITY): Payer: Self-pay

## 2023-04-10 NOTE — Telephone Encounter (Signed)
 Pharmacy Patient Advocate Encounter   Received notification from CoverMyMeds that prior authorization for Relistor  150MG  tablets is required/requested.   Insurance verification completed.   The patient is insured through [place] .   Per test claim: PA required; PA submitted to above mentioned insurance via CoverMyMeds Key/confirmation #/EOC Relistor  150MG  tablets Status is pending

## 2023-04-12 NOTE — Telephone Encounter (Signed)
Pharmacy Patient Advocate Encounter  Received notification from [facility] that Prior Authorization for Relistor 150mg  tablets has been DENIED.  See denial reason below. No denial letter attached in CMM. Will attach denial letter to Media tab once received.   PA #/Case ID/Reference #: [id number]

## 2023-04-25 DIAGNOSIS — R3914 Feeling of incomplete bladder emptying: Secondary | ICD-10-CM | POA: Diagnosis not present

## 2023-04-25 DIAGNOSIS — N401 Enlarged prostate with lower urinary tract symptoms: Secondary | ICD-10-CM | POA: Diagnosis not present

## 2023-04-26 ENCOUNTER — Encounter: Payer: Self-pay | Admitting: Internal Medicine

## 2023-05-01 ENCOUNTER — Telehealth (HOSPITAL_COMMUNITY): Payer: Self-pay

## 2023-05-01 ENCOUNTER — Other Ambulatory Visit: Payer: Self-pay | Admitting: Internal Medicine

## 2023-05-01 MED ORDER — ALPRAZOLAM 1 MG PO TABS: 1.0000 mg | ORAL_TABLET | Freq: Two times a day (BID) | ORAL | 2 refills | Status: DC | PRN

## 2023-05-01 NOTE — Telephone Encounter (Signed)
 Pharmacy Patient Advocate Encounter   Received notification from CoverMyMeds that prior authorization for Alprazolam  1 mg tablets is required/requested.   Insurance verification completed.   The patient is insured through [place] .   Per test claim: PA required; PA started via CoverMyMeds. KEY [id number] . Waiting for clinical questions to populate.

## 2023-05-08 ENCOUNTER — Ambulatory Visit (INDEPENDENT_AMBULATORY_CARE_PROVIDER_SITE_OTHER): Payer: Medicare Other | Admitting: Internal Medicine

## 2023-05-08 ENCOUNTER — Encounter: Payer: Self-pay | Admitting: Internal Medicine

## 2023-05-08 ENCOUNTER — Ambulatory Visit (INDEPENDENT_AMBULATORY_CARE_PROVIDER_SITE_OTHER): Payer: Medicare Other

## 2023-05-08 VITALS — BP 110/86 | HR 81 | Temp 98.0°F | Ht 73.0 in | Wt 243.0 lb

## 2023-05-08 DIAGNOSIS — R109 Unspecified abdominal pain: Secondary | ICD-10-CM | POA: Diagnosis not present

## 2023-05-08 DIAGNOSIS — R1011 Right upper quadrant pain: Secondary | ICD-10-CM | POA: Diagnosis not present

## 2023-05-08 DIAGNOSIS — R10A1 Flank pain, right side: Secondary | ICD-10-CM | POA: Insufficient documentation

## 2023-05-08 DIAGNOSIS — K5900 Constipation, unspecified: Secondary | ICD-10-CM | POA: Diagnosis not present

## 2023-05-08 DIAGNOSIS — F39 Unspecified mood [affective] disorder: Secondary | ICD-10-CM | POA: Diagnosis not present

## 2023-05-08 DIAGNOSIS — K5904 Chronic idiopathic constipation: Secondary | ICD-10-CM | POA: Diagnosis not present

## 2023-05-08 LAB — COMPREHENSIVE METABOLIC PANEL
ALT: 33 U/L (ref 0–53)
AST: 25 U/L (ref 0–37)
Albumin: 4.3 g/dL (ref 3.5–5.2)
Alkaline Phosphatase: 71 U/L (ref 39–117)
BUN: 7 mg/dL (ref 6–23)
CO2: 32 meq/L (ref 19–32)
Calcium: 9.4 mg/dL (ref 8.4–10.5)
Chloride: 100 meq/L (ref 96–112)
Creatinine, Ser: 0.98 mg/dL (ref 0.40–1.50)
GFR: 77.27 mL/min (ref >60.00)
Glucose, Bld: 117 mg/dL — ABNORMAL HIGH (ref 70–99)
Potassium: 4.5 meq/L (ref 3.5–5.1)
Sodium: 139 meq/L (ref 135–145)
Total Bilirubin: 0.7 mg/dL (ref 0.2–1.2)
Total Protein: 6.9 g/dL (ref 6.0–8.3)

## 2023-05-08 LAB — CBC WITH DIFFERENTIAL/PLATELET
Basophils Absolute: 0 10*3/uL (ref 0.0–0.1)
Basophils Relative: 0.7 % (ref 0.0–3.0)
Eosinophils Absolute: 0.2 10*3/uL (ref 0.0–0.7)
Eosinophils Relative: 3.1 % (ref 0.0–5.0)
HCT: 44.6 % (ref 39.0–52.0)
Hemoglobin: 15.4 g/dL (ref 13.0–17.0)
Lymphocytes Relative: 19 % (ref 12.0–46.0)
Lymphs Abs: 1.3 10*3/uL (ref 0.7–4.0)
MCHC: 34.6 g/dL (ref 30.0–36.0)
MCV: 93.6 fL (ref 78.0–100.0)
Monocytes Absolute: 0.6 10*3/uL (ref 0.1–1.0)
Monocytes Relative: 8.4 % (ref 3.0–12.0)
Neutro Abs: 4.6 10*3/uL (ref 1.4–7.7)
Neutrophils Relative %: 68.8 % (ref 43.0–77.0)
Platelets: 222 10*3/uL (ref 150.0–400.0)
RBC: 4.77 Mil/uL (ref 4.22–5.81)
RDW: 13.6 % (ref 11.5–15.5)
WBC: 6.6 10*3/uL (ref 4.0–10.5)

## 2023-05-08 LAB — URINALYSIS
Bilirubin Urine: NEGATIVE
Hgb urine dipstick: NEGATIVE
Ketones, ur: NEGATIVE
Leukocytes,Ua: NEGATIVE
Nitrite: NEGATIVE
Specific Gravity, Urine: 1.02 (ref 1.000–1.030)
Total Protein, Urine: NEGATIVE
Urine Glucose: NEGATIVE
Urobilinogen, UA: 0.2 (ref 0.0–1.0)
pH: 6 (ref 5.0–8.0)

## 2023-05-08 LAB — SEDIMENTATION RATE: Sed Rate: 15 mm/h (ref 0–20)

## 2023-05-08 NOTE — Assessment & Plan Note (Addendum)
?  etiology: MSK vs other Check CBC, UA, ESR Abd X ray Abd [place]

## 2023-05-08 NOTE — Assessment & Plan Note (Signed)
Info re [employer] given

## 2023-05-08 NOTE — Patient Instructions (Addendum)
Look at smart toilet seats - [employer].[email or web address] Check for Zoster rash  USEFUL THINGS FOR ARTHRITIS and musculoskeletal pains:    A "rice sock heating pad" refers to a homemade heating pad created by filling a sock with uncooked rice, which can be heated in a microwave to provide a warm compress for sore muscles, pain relief, or other applications; essentially, it's a simple way to generate heat using readily available materials.  Key points about rice sock heat: How to make it: Fill a clean sock (preferably a tube sock) about 2/3 full with uncooked rice, tie a knot at the top to secure the rice inside.  Heating it up: Place the rice sock in the microwave and heat in short intervals (usually around 30 seconds at a time) until it reaches the desired warmth.  Important considerations: Check temperature before applying: Always test the temperature of the rice sock before applying it to your skin to avoid burns.  Use a towel to protect skin: Wrap the rice sock in a thin towel to distribute the heat evenly and protect your skin.  Uses: Muscle aches and pains  Menstrual cramps  Neck pain  Arthritis discomfort   SILICONE PADS: Use them to open jars and bottles    BRIX JAR OPENER: Use them to open jars    NITRILE COATED GARDEN GLOVES: Use down to open jars, lift boxes, etc.   THUMB BRACE: Use it for thumb arthritis flareup pain     BLUE EMU CREAM: Use it 2-3 times a day on painful areas

## 2023-05-08 NOTE — Assessment & Plan Note (Signed)
Use LOC Check CBC, UA, ESR Abd X ray

## 2023-05-08 NOTE — Assessment & Plan Note (Signed)
?  etiology: MSK vs other Check CBC, UA, ESR Abd X ray Abd [place]

## 2023-05-08 NOTE — Progress Notes (Signed)
Subjective:  Patient ID: Lee Owens, male    DOB: July 07, 1950  Age: 73 y.o. MRN: [id number]  CC: Back Pain   HPI  Lee Owens presents for R flank pain irrad to the R groin, now going down in the R hip; 7/10 in intensity, constant x 2-3 wks ago C/o constipation F/u on depression    Outpatient Medications Prior to Visit  Medication Sig Dispense Refill   allopurinol (ZYLOPRIM) 300 MG tablet TAKE 1 TABLET(300 MG) BY MOUTH DAILY 90 tablet 3   ALPRAZolam (XANAX) 1 MG tablet Take 1 tablet (1 mg total) by mouth 2 (two) times daily as needed for anxiety. 60 tablet 2   Cholecalciferol (VITAMIN D-3) 5000 UNITS TABS Take 5,000 Units by mouth daily.     Coenzyme Q10 (COQ10) 400 MG CAPS 1 po qd 100 capsule 3   DULoxetine (CYMBALTA) 60 MG capsule Take 1 capsule (60 mg total) by mouth 2 (two) times daily. 180 capsule 1   fentaNYL (DURAGESIC) 25 MCG/HR Place 1 patch onto the skin every other day. 15 patch 0   fentaNYL (DURAGESIC) 25 MCG/HR Place 1 patch onto the skin every other day. 15 patch 0   fentaNYL (DURAGESIC) 25 MCG/HR Place 1 patch onto the skin every other day. 15 patch 0   finasteride (PROSCAR) 5 MG tablet Take 5 mg by mouth daily.     furosemide (LASIX) 40 MG tablet Take 0.5-1 tablets (20-40 mg total) by mouth daily as needed. 30 tablet 5   gabapentin (NEURONTIN) 300 MG capsule TAKE 1 CAPSULE(300 MG) BY MOUTH THREE TIMES DAILY 270 capsule 1   losartan (COZAAR) 25 MG tablet Take 12.5 mg by mouth at bedtime.     lovastatin (MEVACOR) 40 MG tablet TAKE 1 TABLET(40 MG) BY MOUTH AT BEDTIME 90 tablet 3   Methylnaltrexone Bromide (RELISTOR) 150 MG TABS 1 po qd 90 tablet 3   NARCAN 4 MG/0.1ML LIQD nasal spray kit Place 0.1 sprays (0.4 mg total) into the nose as needed. 2 each 3   Oxycodone HCl 10 MG TABS Take 1 tablet (10 mg total) by mouth every 6 (six) hours as needed. 120 tablet 0   Oxycodone HCl 10 MG TABS Take 1 tablet (10 mg total) by mouth every 6 (six) hours as needed. 120 tablet 0    Oxycodone HCl 10 MG TABS Take 1 tablet (10 mg total) by mouth every 6 (six) hours as needed. 120 tablet 0   tamsulosin (FLOMAX) 0.4 MG CAPS capsule Take 1 capsule by mouth daily.  2   temazepam (RESTORIL) 30 MG capsule TAKE 1 TO 2 CAPSULES BY MOUTH AT BEDTIME AS NEEDED FOR SLEEP 180 capsule 1   vitamin B-12 (CYANOCOBALAMIN) 1000 MCG tablet Take 2,000 mcg by mouth daily.     No facility-administered medications prior to visit.    ROS: Review of Systems  Constitutional:  Negative for appetite change, fatigue and unexpected weight change.  HENT:  Negative for congestion, nosebleeds, sneezing, sore throat and trouble swallowing.   Eyes:  Negative for itching and visual disturbance.  Respiratory:  Negative for cough.   Cardiovascular:  Negative for chest pain, palpitations and leg swelling.  Gastrointestinal:  Negative for abdominal distention, blood in stool, diarrhea and nausea.  Genitourinary:  Negative for frequency and hematuria.  Musculoskeletal:  Positive for back pain and gait problem. Negative for joint swelling and neck pain.  Skin:  Negative for rash.  Neurological:  Negative for dizziness, tremors, speech difficulty and weakness.  Psychiatric/Behavioral:  Negative for agitation, dysphoric mood, sleep disturbance and suicidal ideas. The patient is not nervous/anxious.     Objective:  BP 110/86 (BP Location: Left Arm, Patient Position: Sitting, Cuff Size: Normal)   Pulse 81   Temp 98 F (36.7 C) (Oral)   Ht 6\' 1"  (1.854 m)   Wt 243 lb (110.2 kg)   SpO2 90%   BMI 32.06 kg/m   BP Readings from Last 3 Encounters:  05/08/23 110/86  04/04/23 138/88  01/02/23 130/80    Wt Readings from Last 3 Encounters:  05/08/23 243 lb (110.2 kg)  01/02/23 240 lb (108.9 kg)  10/02/22 243 lb (110.2 kg)    Physical Exam Constitutional:      General: He is not in acute distress.    Appearance: He is well-developed. He is obese.     Comments: NAD  Eyes:     Conjunctiva/sclera:  Conjunctivae normal.     Pupils: Pupils are equal, round, and reactive to light.  Neck:     Thyroid: No thyromegaly.     Vascular: No JVD.  Cardiovascular:     Rate and Rhythm: Normal rate and regular rhythm.     Heart sounds: Normal heart sounds. No murmur heard.    No friction rub. No gallop.  Pulmonary:     Effort: Pulmonary effort is normal. No respiratory distress.     Breath sounds: Normal breath sounds. No wheezing or rales.  Chest:     Chest wall: No tenderness.  Abdominal:     General: Bowel sounds are normal. There is no distension.     Palpations: Abdomen is soft. There is no mass.     Tenderness: There is no abdominal tenderness. There is no guarding or rebound.  Musculoskeletal:        General: No tenderness. Normal range of motion.     Cervical back: Normal range of motion.  Lymphadenopathy:     Cervical: No cervical adenopathy.  Skin:    General: Skin is warm and dry.     Findings: No rash.  Neurological:     Mental Status: He is alert and oriented to person, place, and time.     Cranial Nerves: No cranial nerve deficit.     Motor: No abnormal muscle tone.     Coordination: Coordination normal.     Gait: Gait normal.     Deep Tendon Reflexes: Reflexes are normal and symmetric.  Psychiatric:        Behavior: Behavior normal.        Thought Content: Thought content normal.        Judgment: Judgment normal.   Str leg elev (-) B Abd S/NT no mass Umbil hernia and ventral hernia NT No CVT, no rash No HSM R flank is tender w/some ROM Ribs NT    A total time of 45 minutes was spent preparing to see the patient, reviewing tests, x-rays, operative reports and other medical records.  Also, obtaining history and performing comprehensive physical exam.  Additionally, counseling the patient regarding the above listed issues pain, abd pain, depression.   Finally, documenting clinical information in the health records, coordination of care, educating the patient. It is a  complex case.  Lab Results  Component Value Date   WBC 7.4 03/29/2023   HGB 16.1 03/29/2023   HCT 47.2 03/29/2023   PLT 243.0 03/29/2023   GLUCOSE 103 (H) 11/22/2022   CHOL 152 03/29/2023   TRIG 104.0 03/29/2023   HDL 49.30 03/29/2023  LDLCALC 82 03/29/2023   ALT 30 05/30/2021   AST 22 05/30/2021   NA 138 11/22/2022   K 4.1 11/22/2022   CL 100 11/22/2022   CREATININE 1.10 11/22/2022   BUN 8 11/22/2022   CO2 24 11/22/2022   TSH 1.38 03/29/2023   PSA 0.76 06/29/2016   INR 0.96 06/30/2013   HGBA1C 6.2 03/29/2023   MICROALBUR 1.8 03/29/2023    CT ANGIO CHEST AORTA W/CM & OR WO/CM Result Date: 11/10/2022 CLINICAL DATA:  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 EXAM: CT ANGIOGRAPHY CHEST WITH CONTRAST TECHNIQUE: Multidetector CT imaging of the chest was performed using the standard protocol during bolus administration of intravenous contrast. Multiplanar CT image reconstructions and MIPs were obtained to evaluate the vascular anatomy. RADIATION DOSE REDUCTION: This exam was performed according to the departmental dose-optimization program which includes automated exposure control, adjustment of the mA and/or kV according to patient size and/or use of iterative reconstruction technique. CONTRAST:  75mL OMNIPAQUE IOHEXOL 350 MG/ML SOLN COMPARISON:  CTA chest 05/04/2022 FINDINGS: Cardiovascular: No change in caliber of the thoracic aorta. The ascending thoracic aorta measures up to 43 mm in greatest diameter. This is unchanged from 05/04/2022 using similar measuring technique. Aortic and coronary artery calcification. No pericardial effusion. Mediastinum/Nodes: Trachea and esophagus are unremarkable. No adenopathy. Lungs/Pleura: No focal consolidation, pleural effusion, or pneumothorax. Upper Abdomen: No acute abnormality. Musculoskeletal: Bilateral gynecomastia.  No acute fracture. Review of the MIP images confirms the above findings. IMPRESSION: 1. No change in caliber of  the thoracic aorta. The ascending thoracic aorta measures up to 43 mm in greatest diameter using similar measuring technique. Recommend annual imaging followup by CTA or MRA. This recommendation follows 2010 ACCF/AHA/AATS/ACR/ASA/SCA/SCAI/SIR/STS/SVM Guidelines for the Diagnosis and Management of Patients with Thoracic Aortic Disease. Circulation. 2010; 121: Z610-R604. Aortic aneurysm NOS (ICD10-I71.9) Aortic Atherosclerosis (ICD10-I70.0). Electronically Signed   By: Minerva Fester M.D.   On: 11/10/2022 20:21    Assessment & Plan:   Problem List Items Addressed This Visit     Constipation   Use LOC Check CBC, UA, ESR Abd X ray       Mood disorder (HCC)   Info re [place] given      Acute right flank pain - Primary   ?etiology: MSK vs other Check CBC, UA, ESR Abd X ray Abd [place]      Relevant Orders   DG Abd 2 Views   US Abdomen Complete   CBC with Differential/Platelet   Urinalysis   Comprehensive metabolic panel   Sedimentation rate   RUQ pain   ?etiology: MSK vs other Check CBC, UA, ESR Abd X ray Abd [place]      Relevant Orders   DG Abd 2 Views   US Abdomen Complete   CBC with Differential/Platelet   Urinalysis   Comprehensive metabolic panel   Sedimentation rate      No orders of the defined types were placed in this encounter.     Follow-up: Return in about 2 weeks (around 05/22/2023) for a follow-up visit.  Sonda Primes, MD

## 2023-05-09 ENCOUNTER — Encounter: Payer: Self-pay | Admitting: Internal Medicine

## 2023-05-11 ENCOUNTER — Other Ambulatory Visit (HOSPITAL_COMMUNITY): Payer: Self-pay

## 2023-05-11 ENCOUNTER — Ambulatory Visit
Admission: RE | Admit: 2023-05-11 | Discharge: 2023-05-11 | Disposition: A | Discharge status: Home / Self Care | Payer: Medicare Other | Source: Ambulatory Visit | Attending: Internal Medicine | Admitting: Internal Medicine

## 2023-05-11 DIAGNOSIS — K760 Fatty (change of) liver, not elsewhere classified: Secondary | ICD-10-CM | POA: Diagnosis not present

## 2023-05-11 DIAGNOSIS — R109 Unspecified abdominal pain: Secondary | ICD-10-CM | POA: Diagnosis not present

## 2023-05-12 ENCOUNTER — Encounter: Payer: Self-pay | Admitting: Internal Medicine

## 2023-05-19 ENCOUNTER — Other Ambulatory Visit: Payer: Self-pay | Admitting: Internal Medicine

## 2023-05-22 ENCOUNTER — Other Ambulatory Visit (HOSPITAL_COMMUNITY): Payer: Self-pay

## 2023-05-22 ENCOUNTER — Ambulatory Visit (INDEPENDENT_AMBULATORY_CARE_PROVIDER_SITE_OTHER): Payer: Medicare Other | Admitting: Podiatry

## 2023-05-22 DIAGNOSIS — B351 Tinea unguium: Secondary | ICD-10-CM

## 2023-05-22 DIAGNOSIS — M79675 Pain in left toe(s): Secondary | ICD-10-CM

## 2023-05-22 DIAGNOSIS — M79674 Pain in right toe(s): Secondary | ICD-10-CM

## 2023-05-22 NOTE — Progress Notes (Signed)
 He presents today chief complaint of painful elongated toenails.  Objective: Pulses are palpable.  Toenails are long thick yellow dystrophic with mycotic and painful palpation.  Assessment: Pain limb secondary to onychomycosis.  Plan: Debridement of toenails 1 through 5 bilateral.

## 2023-05-22 NOTE — Telephone Encounter (Signed)
 Pharmacy Patient Advocate Encounter   Received notification from CoverMyMeds that prior authorization for ALPRAZolam 1MG  tablets is required/requested.   Insurance verification completed.   The patient is insured through [employer] .   Per test claim: PA required; PA submitted to above mentioned insurance via CoverMyMeds Key/confirmation #/EOC [id number] Status is pending  *previous prior auth questions expired, renewed and sent.

## 2023-05-23 ENCOUNTER — Encounter: Payer: Self-pay | Admitting: Internal Medicine

## 2023-05-24 NOTE — Telephone Encounter (Signed)
 Pharmacy Patient Advocate Encounter  Received notification from [facility] that Prior Authorization for ALPRAZolam 1MG  tablets has been APPROVED from 05-22-2023 to 03-26-2024   PA #/Case ID/Reference #: [id number]

## 2023-05-30 ENCOUNTER — Telehealth: Payer: Self-pay | Admitting: Internal Medicine

## 2023-05-30 NOTE — Telephone Encounter (Signed)
 Copied from CRM [id number]. Topic: General - Other >> May 30, 2023  4:02 PM Pascal Lux wrote: Reason for CRM: Shameka from [employer] - Calling to check status of forms that were faxed on 2/24 and 2/28.  ---  Please verify if we have received forms

## 2023-06-01 NOTE — Telephone Encounter (Signed)
 Forms have been faxed over.

## 2023-06-12 ENCOUNTER — Telehealth: Payer: Self-pay

## 2023-06-12 NOTE — Telephone Encounter (Signed)
 What form?  Thanks

## 2023-06-12 NOTE — Telephone Encounter (Signed)
 Copied from CRM [id number]. Topic: General - Other >> Jun 12, 2023  4:27 PM Fredrich Romans wrote: Reason for CRM: Shameka from [employer] called in stating that form still have not been received . Faxed #: [phone]

## 2023-06-19 NOTE — Telephone Encounter (Signed)
 Copied from CRM [id number]. Topic: General - Other >> Jun 19, 2023 12:07 PM Sim Boast F wrote: Reason for CRM: Shameka from [employer] following up on form, says she still has not received it Fax #  [phone]

## 2023-06-25 ENCOUNTER — Other Ambulatory Visit (HOSPITAL_COMMUNITY): Payer: Self-pay

## 2023-06-25 NOTE — Telephone Encounter (Signed)
 Copied from CRM [id number]. Topic: General - Other >> Jun 25, 2023 10:52 AM Florestine Avers wrote: Reason for CRM: Rayburn Ma, [employer] wants to know if the documents were faxed back, she states she spoke with [facility] and she assured her that they faxed them back. Rayburn Ma also states that she called again to receive the forms back, and was told the forms were sent back on March 7th and but she has yet to receive them. She wants to confirm if they were sent and to which fax number.  call back number: [phone]  Fax: [phone]

## 2023-06-26 ENCOUNTER — Other Ambulatory Visit: Payer: Self-pay | Admitting: Internal Medicine

## 2023-06-26 MED ORDER — DIAZEPAM 10 MG PO TABS: ORAL_TABLET | ORAL | 3 refills | Status: DC

## 2023-06-26 NOTE — Progress Notes (Signed)
 Needs to change back to diazepam

## 2023-07-03 ENCOUNTER — Encounter: Payer: Self-pay | Admitting: Internal Medicine

## 2023-07-03 ENCOUNTER — Ambulatory Visit (INDEPENDENT_AMBULATORY_CARE_PROVIDER_SITE_OTHER)

## 2023-07-03 ENCOUNTER — Ambulatory Visit (INDEPENDENT_AMBULATORY_CARE_PROVIDER_SITE_OTHER): Payer: BLUE CROSS/BLUE SHIELD | Admitting: Internal Medicine

## 2023-07-03 VITALS — BP 138/84 | HR 94 | Temp 97.8°F | Ht 73.0 in | Wt 237.0 lb

## 2023-07-03 DIAGNOSIS — M5441 Lumbago with sciatica, right side: Secondary | ICD-10-CM | POA: Diagnosis not present

## 2023-07-03 DIAGNOSIS — M5031 Other cervical disc degeneration,  high cervical region: Secondary | ICD-10-CM | POA: Diagnosis not present

## 2023-07-03 DIAGNOSIS — F419 Anxiety disorder, unspecified: Secondary | ICD-10-CM | POA: Diagnosis not present

## 2023-07-03 DIAGNOSIS — G8929 Other chronic pain: Secondary | ICD-10-CM

## 2023-07-03 DIAGNOSIS — M542 Cervicalgia: Secondary | ICD-10-CM

## 2023-07-03 DIAGNOSIS — M5442 Lumbago with sciatica, left side: Secondary | ICD-10-CM

## 2023-07-03 DIAGNOSIS — M79602 Pain in left arm: Secondary | ICD-10-CM | POA: Diagnosis not present

## 2023-07-03 DIAGNOSIS — G479 Sleep disorder, unspecified: Secondary | ICD-10-CM

## 2023-07-03 DIAGNOSIS — F5104 Psychophysiologic insomnia: Secondary | ICD-10-CM | POA: Diagnosis not present

## 2023-07-03 DIAGNOSIS — M4802 Spinal stenosis, cervical region: Secondary | ICD-10-CM | POA: Diagnosis not present

## 2023-07-03 DIAGNOSIS — E119 Type 2 diabetes mellitus without complications: Secondary | ICD-10-CM

## 2023-07-03 DIAGNOSIS — E538 Deficiency of other specified B group vitamins: Secondary | ICD-10-CM | POA: Diagnosis not present

## 2023-07-03 DIAGNOSIS — M791 Myalgia, unspecified site: Secondary | ICD-10-CM | POA: Diagnosis not present

## 2023-07-03 DIAGNOSIS — M50322 Other cervical disc degeneration at C5-C6 level: Secondary | ICD-10-CM | POA: Diagnosis not present

## 2023-07-03 DIAGNOSIS — E559 Vitamin D deficiency, unspecified: Secondary | ICD-10-CM

## 2023-07-03 DIAGNOSIS — I10 Essential (primary) hypertension: Secondary | ICD-10-CM | POA: Diagnosis not present

## 2023-07-03 MED ORDER — FENTANYL 25 MCG/HR TD PT72: 1.0000 | MEDICATED_PATCH | TRANSDERMAL | 0 refills | Status: DC

## 2023-07-03 MED ORDER — OXYCODONE HCL 10 MG PO TABS: 10.0000 mg | ORAL_TABLET | Freq: Four times a day (QID) | ORAL | 0 refills | Status: DC | PRN

## 2023-07-03 NOTE — Assessment & Plan Note (Signed)
On Diazepam prn  Potential benefits of a long term benzodiazepines  use as well as potential risks  and complications were explained to the patient and were aknowledged.  

## 2023-07-03 NOTE — Assessment & Plan Note (Signed)
Chronic On Temazepam  Potential benefits of a long term benzodiazepines  use as well as potential risks  and complications were explained to the patient and were aknowledged.

## 2023-07-03 NOTE — Assessment & Plan Note (Signed)
Refractory - weekly Vit d 50000 iu

## 2023-07-03 NOTE — Assessment & Plan Note (Signed)
NAS 

## 2023-07-03 NOTE — Assessment & Plan Note (Signed)
 S/p fall Check X ray C spine

## 2023-07-03 NOTE — Assessment & Plan Note (Signed)
Continue on: Oxy 10 mg qid prn Fentanyl q 48 h Narcane prn  Potential benefits of a long term opioids use as well as potential risks (i.e. addiction risk, apnea etc) and complications (i.e. Somnolence, constipation and others) were explained to the patient and were aknowledged

## 2023-07-03 NOTE — Assessment & Plan Note (Signed)
 Xanax gave bad dreams - d/c'd

## 2023-07-03 NOTE — Assessment & Plan Note (Signed)
On B12/B complex 

## 2023-07-03 NOTE — Progress Notes (Signed)
 Subjective:  Patient ID: Lee Owens, male    DOB: 10-08-1950  Age: 73 y.o. MRN: [id number]  CC: Follow-up (3 months f/u)   HPI Lee Owens presents for chronic pain, anxiety, depression Casimiro Needle tripped and fell in the yard the other night C/o neck pain  Outpatient Medications Prior to Visit  Medication Sig Dispense Refill   allopurinol (ZYLOPRIM) 300 MG tablet TAKE 1 TABLET(300 MG) BY MOUTH DAILY 90 tablet 3   Cholecalciferol (VITAMIN D-3) 5000 UNITS TABS Take 5,000 Units by mouth daily.     Coenzyme Q10 (COQ10) 400 MG CAPS 1 po qd 100 capsule 3   diazepam (VALIUM) 10 MG tablet TAKE 1/2 TO 1 TABLET(5 TO 10 MG) BY MOUTH EVERY 8 HOURS AS NEEDED FOR ANXIETY OR MUSCLE SPASMS 90 tablet 3   DULoxetine (CYMBALTA) 60 MG capsule Take 1 capsule (60 mg total) by mouth 2 (two) times daily. 180 capsule 1   finasteride (PROSCAR) 5 MG tablet Take 5 mg by mouth daily.     furosemide (LASIX) 40 MG tablet Take 0.5-1 tablets (20-40 mg total) by mouth daily as needed. 30 tablet 5   gabapentin (NEURONTIN) 300 MG capsule TAKE 1 CAPSULE(300 MG) BY MOUTH THREE TIMES DAILY 270 capsule 1   losartan (COZAAR) 25 MG tablet Take 12.5 mg by mouth at bedtime.     lovastatin (MEVACOR) 40 MG tablet TAKE 1 TABLET(40 MG) BY MOUTH AT BEDTIME 90 tablet 3   Methylnaltrexone Bromide (RELISTOR) 150 MG TABS 1 po qd 90 tablet 3   NARCAN 4 MG/0.1ML LIQD nasal spray kit Place 0.1 sprays (0.4 mg total) into the nose as needed. 2 each 3   tamsulosin (FLOMAX) 0.4 MG CAPS capsule Take 1 capsule by mouth daily.  2   temazepam (RESTORIL) 30 MG capsule TAKE 1 TO 2 CAPSULES BY MOUTH AT BEDTIME AS NEEDED FOR SLEEP 180 capsule 1   vitamin B-12 (CYANOCOBALAMIN) 1000 MCG tablet Take 2,000 mcg by mouth daily.     fentaNYL (DURAGESIC) 25 MCG/HR Place 1 patch onto the skin every other day. 15 patch 0   fentaNYL (DURAGESIC) 25 MCG/HR Place 1 patch onto the skin every other day. 15 patch 0   fentaNYL (DURAGESIC) 25 MCG/HR Place 1 patch  onto the skin every other day. 15 patch 0   Oxycodone HCl 10 MG TABS Take 1 tablet (10 mg total) by mouth every 6 (six) hours as needed. 120 tablet 0   Oxycodone HCl 10 MG TABS Take 1 tablet (10 mg total) by mouth every 6 (six) hours as needed. 120 tablet 0   Oxycodone HCl 10 MG TABS Take 1 tablet (10 mg total) by mouth every 6 (six) hours as needed. 120 tablet 0   No facility-administered medications prior to visit.    ROS: Review of Systems  Constitutional:  Negative for appetite change, fatigue and unexpected weight change.  HENT:  Negative for congestion, nosebleeds, sneezing, sore throat and trouble swallowing.   Eyes:  Negative for itching and visual disturbance.  Respiratory:  Negative for cough.   Cardiovascular:  Negative for chest pain, palpitations and leg swelling.  Gastrointestinal:  Negative for abdominal distention, blood in stool, diarrhea and nausea.  Genitourinary:  Negative for frequency and hematuria.  Musculoskeletal:  Positive for arthralgias, back pain and gait problem. Negative for joint swelling and neck pain.  Skin:  Negative for rash.  Neurological:  Negative for dizziness, tremors, speech difficulty and weakness.  Psychiatric/Behavioral:  Positive for dysphoric mood.  Negative for agitation, confusion, decreased concentration, self-injury, sleep disturbance and suicidal ideas. The patient is nervous/anxious.     Objective:  BP 138/84 (BP Location: Left Arm, Patient Position: Sitting)   Pulse 94   Temp 97.8 F (36.6 C) (Temporal)   Ht 6\' 1"  (1.854 m)   Wt 237 lb (107.5 kg)   SpO2 96%   BMI 31.27 kg/m   BP Readings from Last 3 Encounters:  07/03/23 138/84  05/08/23 110/86  04/04/23 138/88    Wt Readings from Last 3 Encounters:  07/03/23 237 lb (107.5 kg)  05/08/23 243 lb (110.2 kg)  01/02/23 240 lb (108.9 kg)    Physical Exam Constitutional:      General: He is not in acute distress.    Appearance: Normal appearance. He is well-developed.      Comments: NAD  Eyes:     Conjunctiva/sclera: Conjunctivae normal.     Pupils: Pupils are equal, round, and reactive to light.  Neck:     Thyroid: No thyromegaly.     Vascular: No JVD.  Cardiovascular:     Rate and Rhythm: Normal rate and regular rhythm.     Heart sounds: Normal heart sounds. No murmur heard.    No friction rub. No gallop.  Pulmonary:     Effort: Pulmonary effort is normal. No respiratory distress.     Breath sounds: Normal breath sounds. No wheezing or rales.  Chest:     Chest wall: No tenderness.  Abdominal:     General: Bowel sounds are normal. There is no distension.     Palpations: Abdomen is soft. There is no mass.     Tenderness: There is no abdominal tenderness. There is no guarding or rebound.  Musculoskeletal:        General: No tenderness. Normal range of motion.     Cervical back: Normal range of motion.     Right lower leg: No edema.     Left lower leg: No edema.  Lymphadenopathy:     Cervical: No cervical adenopathy.  Skin:    General: Skin is warm and dry.     Findings: No rash.  Neurological:     Mental Status: He is alert and oriented to person, place, and time.     Cranial Nerves: No cranial nerve deficit.     Motor: No abnormal muscle tone.     Coordination: Coordination normal.     Gait: Gait abnormal.     Deep Tendon Reflexes: Reflexes are normal and symmetric.  Psychiatric:        Behavior: Behavior normal.        Thought Content: Thought content normal.        Judgment: Judgment normal.   Neck - pain on ROM Using a pole  Lab Results  Component Value Date   WBC 6.6 05/08/2023   HGB 15.4 05/08/2023   HCT 44.6 05/08/2023   PLT 222.0 05/08/2023   GLUCOSE 117 (H) 05/08/2023   CHOL 152 03/29/2023   TRIG 104.0 03/29/2023   HDL 49.30 03/29/2023   LDLCALC 82 03/29/2023   ALT 33 05/08/2023   AST 25 05/08/2023   NA 139 05/08/2023   K 4.5 05/08/2023   CL 100 05/08/2023   CREATININE 0.98 05/08/2023   BUN 7 05/08/2023   CO2 32  05/08/2023   TSH 1.38 03/29/2023   PSA 0.76 06/29/2016   INR 0.96 06/30/2013   HGBA1C 6.2 03/29/2023   MICROALBUR 1.8 03/29/2023    US Abdomen Complete  Result Date: 05/11/2023 CLINICAL DATA:  Flank pain for 3 weeks EXAM: ABDOMEN ULTRASOUND COMPLETE COMPARISON:  None Available. FINDINGS: Gallbladder: Dilated gallbladder.  No shadowing stones. Common bile duct: Diameter: 4 mm Liver: Diffusely echogenic hepatic parenchyma consistent with fatty liver infiltration. Portal vein is patent on color Doppler imaging with normal direction of blood flow towards the liver. IVC: No abnormality visualized. Pancreas: Partially obscured by overlapping bowel gas and soft tissue. Spleen: Size and appearance within normal limits. Right Kidney: Length: 11.0 cm. Echogenicity within normal limits. No mass or hydronephrosis visualized. Left Kidney: Length: 11.4 cm. Echogenicity within normal limits. No mass or hydronephrosis visualized. Abdominal aorta: No aneurysm visualized. Other findings: None. IMPRESSION: No gallstones or ductal dilatation.  Fatty liver infiltration Electronically Signed   By: Karen Kays M.D.   On: 05/11/2023 10:18    Assessment & Plan:   Problem List Items Addressed This Visit     Vitamin D deficiency   Refractory - weekly Vit d 50000 iu      Myalgia   Continue on: Oxy 10 mg qid prn Fentanyl q 48 h Narcane prn  Potential benefits of a long term opioids use as well as potential risks (i.e. addiction risk, apnea etc) and complications (i.e. Somnolence, constipation and others) were explained to the patient and were aknowledged      Disturbance in sleep behavior   Xanax gave bad dreams - d/c'd      Diabetes mellitus, type 2 (HCC)   Monitor Hgb A1c       Hypertension   NAS      Insomnia   Chronic  On Temazepam  Potential benefits of a long term benzodiazepines  use as well as potential risks  and complications were explained to the patient and were aknowledged.        Anxiety   On Diazepam prn  Potential benefits of a long term benzodiazepines  use as well as potential risks  and complications were explained to the patient and were aknowledged.      Vitamin B12 deficiency   On B12/B complex      Low back pain - Primary   Continue on: Oxy 10 mg qid prn Fentanyl q 48 h Narcane prn  Potential benefits of a long term opioids use as well as potential risks (i.e. addiction risk, apnea etc) and complications (i.e. Somnolence, constipation and others) were explained to the patient and were aknowledged      Relevant Medications   fentaNYL (DURAGESIC) 25 MCG/HR   Oxycodone HCl 10 MG TABS   fentaNYL (DURAGESIC) 25 MCG/HR   Oxycodone HCl 10 MG TABS   Oxycodone HCl 10 MG TABS   fentaNYL (DURAGESIC) 25 MCG/HR   Neck pain   S/p fall Check X ray C spine      Relevant Orders   DG Cervical Spine Complete      Meds ordered this encounter  Medications   fentaNYL (DURAGESIC) 25 MCG/HR    Sig: Place 1 patch onto the skin every other day.    Dispense:  15 patch    Refill:  0    Please fill on or after 09/04/23   Oxycodone HCl 10 MG TABS    Sig: Take 1 tablet (10 mg total) by mouth every 6 (six) hours as needed.    Dispense:  120 tablet    Refill:  0    Please fill on or after 07/06/23   fentaNYL (DURAGESIC) 25 MCG/HR    Sig: Place 1  patch onto the skin every other day.    Dispense:  15 patch    Refill:  0    Please fill on or after 08/05/23   Oxycodone HCl 10 MG TABS    Sig: Take 1 tablet (10 mg total) by mouth every 6 (six) hours as needed.    Dispense:  120 tablet    Refill:  0    Please fill on or after 09/04/23   Oxycodone HCl 10 MG TABS    Sig: Take 1 tablet (10 mg total) by mouth every 6 (six) hours as needed.    Dispense:  120 tablet    Refill:  0    Please fill on or after 08/05/23   fentaNYL (DURAGESIC) 25 MCG/HR    Sig: Place 1 patch onto the skin every other day.    Dispense:  15 patch    Refill:  0    Please fill on or after  07/06/23      Follow-up: Return for a follow-up visit.  Sonda Primes, MD

## 2023-07-03 NOTE — Assessment & Plan Note (Addendum)
Monitor HgbA1c

## 2023-07-04 ENCOUNTER — Encounter: Payer: Self-pay | Admitting: Internal Medicine

## 2023-07-04 ENCOUNTER — Ambulatory Visit: Payer: BLUE CROSS/BLUE SHIELD | Admitting: Internal Medicine

## 2023-07-04 NOTE — Telephone Encounter (Signed)
 Copied from CRM [id number]. Topic: General - Other >> Jul 04, 2023  2:58 PM Ma Hillock G wrote: Reason for CRM: Shameka from [employer] calling to check on a fax that she is stating her office never rec'd for the patient. Called [employer] to check on fax. Spoke with [employer] rep and  the CMA and provider is out. Shameka's contact number [phone].

## 2023-07-05 ENCOUNTER — Ambulatory Visit: Payer: BLUE CROSS/BLUE SHIELD | Admitting: Internal Medicine

## 2023-07-06 NOTE — Telephone Encounter (Signed)
 Copied from CRM [id number]. Topic: Clinical - Prescription Issue >> Jul 06, 2023  8:25 AM Pascal Lux wrote: Reason for CRM: Patient spouse called and stated that the pharmacy only has 2 fentaNYL (DURAGESIC) 25 MCG/HR [[id number]] in stock for her to pick up today but the prescription was written for 3. So, she is requesting a separate prescription sent to the pharmacy next week for 1 box.

## 2023-07-10 NOTE — Telephone Encounter (Signed)
 Called and spoke with patient's spouse and informed them to reach out to us  a few days prior to the script being out. Looks like the patches come in 5s and they received two. I'm guessing the last script could just be for 5 patches but not sure.

## 2023-07-16 ENCOUNTER — Encounter: Payer: Self-pay | Admitting: Internal Medicine

## 2023-07-20 NOTE — Telephone Encounter (Signed)
 Copied from CRM [id number]. Topic: Clinical - Prescription Issue >> Jul 20, 2023  9:09 AM Kita Perish H wrote: Reason for CRM: Patients wife calling following up on message sent to provider regarding needing a prescription sent in for the 1 box of fentaNYL  (DURAGESIC ) 25 MCG/HR, please reach out to patients wife, thanks.  Adriana Hopping [phone]

## 2023-07-23 ENCOUNTER — Other Ambulatory Visit: Payer: Self-pay | Admitting: Internal Medicine

## 2023-07-23 MED ORDER — FENTANYL 25 MCG/HR TD PT72: 1.0000 | MEDICATED_PATCH | TRANSDERMAL | 0 refills | Status: DC

## 2023-07-23 NOTE — Progress Notes (Signed)
 Fentanyl  1 box Rx

## 2023-07-23 NOTE — Telephone Encounter (Signed)
 Pt is still requesting a new RX for the 3rd box of patches. Since there were only 2 in stock at the time of the initial RX pickup the pharmacy will not honor the 3rd XR.   Please re-send ASAP.  Pt is frustrated that they having been trying to resolve this issue for the past week.

## 2023-08-22 ENCOUNTER — Other Ambulatory Visit: Payer: Self-pay | Admitting: Internal Medicine

## 2023-08-28 ENCOUNTER — Ambulatory Visit (INDEPENDENT_AMBULATORY_CARE_PROVIDER_SITE_OTHER)

## 2023-08-28 VITALS — Ht 73.0 in | Wt 237.0 lb

## 2023-08-28 DIAGNOSIS — Z1212 Encounter for screening for malignant neoplasm of rectum: Secondary | ICD-10-CM | POA: Diagnosis not present

## 2023-08-28 DIAGNOSIS — Z0000 Encounter for general adult medical examination without abnormal findings: Secondary | ICD-10-CM | POA: Diagnosis not present

## 2023-08-28 DIAGNOSIS — Z1211 Encounter for screening for malignant neoplasm of colon: Secondary | ICD-10-CM | POA: Diagnosis not present

## 2023-08-28 NOTE — Patient Instructions (Addendum)
 Lee Owens , Thank you for taking time out of your busy schedule to complete your Annual Wellness Visit with me. I enjoyed our conversation and look forward to speaking with you again next year. I, as well as your care team,  appreciate your ongoing commitment to your health goals. Please review the following plan we discussed and let me know if I can assist you in the future. Your Game plan/ To Do List    Referrals: If you haven't heard from the office you've been referred to, please reach out to them at the phone provided.  Please call [facility], at [phone], to schedule a colonoscopy. Follow up Visits: Next Medicare AWV with our clinical staff: 08/28/2024.   Have you seen your provider in the last 6 months (3 months if uncontrolled diabetes)? Yes Next Office Visit with your provider: 10/02/2023.  Clinician Recommendations:  Aim for 30 minutes of exercise or brisk walking, 6-8 glasses of water, and 5 servings of fruits and vegetables each day. You are due for a tetanus vaccine, a foot exam and a diabetic eye exam.      This is a list of the screening recommended for you and due dates:  Health Maintenance  Topic Date Due   Medicare Annual Wellness Visit  Never done   Complete foot exam   Never done   Eye exam for diabetics  Never done   DTaP/Tdap/Td vaccine (2 - Tdap) 04/28/2020   Colon Cancer Screening  10/11/2021   COVID-19 Vaccine (5 - 2024-25 season) 11/26/2022   Hemoglobin A1C  09/26/2023   Flu Shot  10/26/2023   Yearly kidney health urinalysis for diabetes  03/28/2024   Yearly kidney function blood test for diabetes  05/07/2024   Pneumonia Vaccine  Completed   Hepatitis C Screening  Completed   HPV Vaccine  Aged Out   Meningitis B Vaccine  Aged Out   Zoster (Shingles) Vaccine  Discontinued    Advanced directives: (Copy Requested) Please bring a copy of your health care power of attorney and living will to the office to be added to your chart at  your convenience. You can mail to [facility] [address] [place], [place] [address] or email to [email or web address] Advance Care Planning is important because it:  [x]  Makes sure you receive the medical care that is consistent with your values, goals, and preferences  [x]  It provides guidance to your family and loved ones and reduces their decisional burden about whether or not they are making the right decisions based on your wishes.  Follow the link provided in your after visit summary or read over the paperwork we have mailed to you to help you started getting your Advance Directives in place. If you need assistance in completing these, please reach out to us  so that we can help you!  See attachments for Preventive Care and Fall Prevention Tips.  Managing Pain Without Opioids Opioids are strong medicines used to treat moderate to severe pain. For some people, especially those who have long-term (chronic) pain, opioids may not be the best choice for pain management due to: Side effects like nausea, constipation, and sleepiness. The risk of addiction (opioid use disorder). The longer you take opioids, the greater your risk of addiction. Pain that lasts for more than 3 months is called chronic pain. Managing chronic pain usually requires more than one approach and is often provided by a team of health care providers working together (multidisciplinary approach).  Pain management may be done at a pain management center or pain clinic. How to manage pain without the use of opioids Use non-opioid medicines Non-opioid medicines for pain may include: Over-the-counter or prescription non-steroidal anti-inflammatory drugs (NSAIDs). These may be the first medicines used for pain. They work well for muscle and bone pain, and they reduce swelling. Acetaminophen . This over-the-counter medicine may work well for milder pain but not swelling. Antidepressants. These may be used to  treat chronic pain. A certain type of antidepressant (tricyclics) is often used. These medicines are given in lower doses for pain than when used for depression. Anticonvulsants. These are usually used to treat seizures but may also reduce nerve (neuropathic) pain. Muscle relaxants. These relieve pain caused by sudden muscle tightening (spasms). You may also use a pain medicine that is applied to the skin as a patch, cream, or gel (topical analgesic), such as a numbing medicine. These may cause fewer side effects than medicines taken by mouth. Do certain therapies as directed Some therapies can help with pain management. They include: Physical therapy. You will do exercises to gain strength and flexibility. A physical therapist may teach you exercises to move and stretch parts of your body that are weak, stiff, or painful. You can learn these exercises at physical therapy visits and practice them at home. Physical therapy may also involve: Massage. Heat wraps or applying heat or cold to affected areas. Electrical signals that interrupt pain signals (transcutaneous electrical nerve stimulation, TENS). Weak lasers that reduce pain and swelling (low-level laser therapy). Signals from your body that help you learn to regulate pain (biofeedback). Occupational therapy. This helps you to learn ways to function at home and work with less pain. Recreational therapy. This involves trying new activities or hobbies, such as a physical activity or drawing. Mental health therapy, including: Cognitive behavioral therapy (CBT). This helps you learn coping skills for dealing with pain. Acceptance and commitment therapy (ACT) to change the way you think and react to pain. Relaxation therapies, including muscle relaxation exercises and mindfulness-based stress reduction. Pain management counseling. This may be individual, family, or group counseling.  Receive medical treatments Medical treatments for pain  management include: Nerve block injections. These may include a pain blocker and anti-inflammatory medicines. You may have injections: Near the spine to relieve chronic back or neck pain. Into joints to relieve back or joint pain. Into nerve areas that supply a painful area to relieve body pain. Into muscles (trigger point injections) to relieve some painful muscle conditions. A medical device placed near your spine to help block pain signals and relieve nerve pain or chronic back pain (spinal cord stimulation device). Acupuncture. Follow these instructions at home Medicines Take over-the-counter and prescription medicines only as told by your health care provider. If you are taking pain medicine, ask your health care providers about possible side effects to watch out for. Do not drive or use heavy machinery while taking prescription opioid pain medicine. Lifestyle  Do not use drugs or alcohol to reduce pain. If you drink alcohol, limit how much you have to: 0-1 drink a day for women who are not pregnant. 0-2 drinks a day for men. Know how much alcohol is in a drink. In the [place] one drink equals one 12 oz bottle of beer (355 mL), one 5 oz glass of wine (148 mL), or one 1 oz glass of hard liquor (44 mL). Do not use any products that contain nicotine or tobacco. These products include cigarettes, chewing  tobacco, and vaping devices, such as e-cigarettes. If you need help quitting, ask your health care provider. Eat a healthy diet and maintain a healthy weight. Poor diet and excess weight may make pain worse. Eat foods that are high in fiber. These include fresh fruits and vegetables, whole grains, and beans. Limit foods that are high in fat and processed sugars, such as fried and sweet foods. Exercise regularly. Exercise lowers stress and may help relieve pain. Ask your health care provider what activities and exercises are safe for you. If your health care provider approves, join an  exercise class that combines movement and stress reduction. Examples include yoga and tai chi. Get enough sleep. Lack of sleep may make pain worse. Lower stress as much as possible. Practice stress reduction techniques as told by your therapist. General instructions Work with all your pain management providers to find the treatments that work best for you. You are an important member of your pain management team. There are many things you can do to reduce pain on your own. Consider joining an online or in-person support group for people who have chronic pain. Keep all follow-up visits. This is important. Where to find more information You can find more information about managing pain without opioids from: American Academy of Pain Medicine: painmed.org Institute for Chronic Pain: instituteforchronicpain.org American Chronic Pain Association: theacpa.org Contact a health care provider if: You have side effects from pain medicine. Your pain gets worse or does not get better with treatments or home therapy. You are struggling with anxiety or depression. Summary Many types of pain can be managed without opioids. Chronic pain may respond better to pain management without opioids. Pain is best managed when you and a team of health care providers work together. Pain management without opioids may include non-opioid medicines, medical treatments, physical therapy, mental health therapy, and lifestyle changes. Tell your health care providers if your pain gets worse or is not being managed well enough. This information is not intended to replace advice given to you by your health care provider. Make sure you discuss any questions you have with your health care provider. Document Revised: 06/23/2020 Document Reviewed: 06/23/2020 Elsevier Patient Education  2024 [employer].

## 2023-08-28 NOTE — Progress Notes (Addendum)
 Subjective:   Lee Owens is a 73 y.o. who presents for a Medicare Wellness preventive visit.  As a reminder, Annual Wellness Visits don't include a physical exam, and some assessments may be limited, especially if this visit is performed virtually. We may recommend an in-person follow-up visit with your provider if needed.  Visit Complete: Virtual I connected with  Lee Owens on 08/28/23 by a audio enabled telemedicine application and verified that I am speaking with the correct person using two identifiers.  Patient Location: Home  Provider Location: Home Office  I discussed the limitations of evaluation and management by telemedicine. The patient expressed understanding and agreed to proceed.  Vital Signs: Because this visit was a virtual/telehealth visit, some criteria may be missing or patient reported. Any vitals not documented were not able to be obtained and vitals that have been documented are patient reported.  VideoDeclined- This patient declined Librarian, academic. Therefore the visit was completed with audio only.  Persons Participating in Visit: Patient.  AWV Questionnaire: Yes: Patient Medicare AWV questionnaire was completed by the patient on 08/24/2023; I have confirmed that all information answered by patient is correct and no changes since this date.  Cardiac Risk Factors include: advanced age (>43men, >39 women);hypertension;diabetes mellitus     Objective:     Today's Vitals   08/24/23 2153 08/28/23 1347  Weight:  237 lb (107.5 kg)  Height:  6\' 1"  (1.854 m)  PainSc: 6     Body mass index is 31.27 kg/m.     08/28/2023    1:56 PM 05/12/2021    8:48 PM 03/23/2018    1:17 AM 07/07/2013    6:43 PM 06/30/2013   10:09 AM 05/12/2013    6:40 PM 05/05/2013    9:16 AM  Advanced Directives  Does Patient Have a Medical Advance Directive? Yes No No Patient does not have advance directive;Patient would not like information  Patient  does not have advance directive;Patient would not like information Patient does not have advance directive;Patient would not like information  Type of Advance Directive Healthcare Power of Attorney        Would patient like information on creating a medical advance directive?  No - Patient declined No - Patient declined      Pre-existing out of facility DNR order (yellow form or pink MOST form)    No No No No    Current Medications (verified) Outpatient Encounter Medications as of 08/28/2023  Medication Sig   allopurinol  (ZYLOPRIM ) 300 MG tablet TAKE 1 TABLET(300 MG) BY MOUTH DAILY   Cholecalciferol  (VITAMIN D -3) 5000 UNITS TABS Take 5,000 Units by mouth daily.   Coenzyme Q10 (COQ10) 400 MG CAPS 1 po qd   diazepam  (VALIUM ) 10 MG tablet TAKE 1/2 TO 1 TABLET(5 TO 10 MG) BY MOUTH EVERY 8 HOURS AS NEEDED FOR ANXIETY OR MUSCLE SPASMS   DULoxetine  (CYMBALTA ) 60 MG capsule TAKE 1 CAPSULE BY MOUTH TWICE DAILY   fentaNYL  (DURAGESIC ) 25 MCG/HR Place 1 patch onto the skin every other day.   fentaNYL  (DURAGESIC ) 25 MCG/HR Place 1 patch onto the skin every other day.   fentaNYL  (DURAGESIC ) 25 MCG/HR Place 1 patch onto the skin every other day.   finasteride  (PROSCAR ) 5 MG tablet Take 5 mg by mouth daily.   furosemide  (LASIX ) 40 MG tablet Take 0.5-1 tablets (20-40 mg total) by mouth daily as needed.   gabapentin  (NEURONTIN ) 300 MG capsule TAKE 1 CAPSULE(300 MG) BY MOUTH THREE TIMES  DAILY   losartan  (COZAAR ) 25 MG tablet Take 12.5 mg by mouth at bedtime.   lovastatin  (MEVACOR ) 40 MG tablet TAKE 1 TABLET(40 MG) BY MOUTH AT BEDTIME   NARCAN  4 MG/0.1ML LIQD nasal spray kit Place 0.1 sprays (0.4 mg total) into the nose as needed.   Oxycodone  HCl 10 MG TABS Take 1 tablet (10 mg total) by mouth every 6 (six) hours as needed.   Oxycodone  HCl 10 MG TABS Take 1 tablet (10 mg total) by mouth every 6 (six) hours as needed.   Oxycodone  HCl 10 MG TABS Take 1 tablet (10 mg total) by mouth every 6 (six) hours as needed.    tamsulosin  (FLOMAX ) 0.4 MG CAPS capsule Take 1 capsule by mouth daily.   temazepam  (RESTORIL ) 30 MG capsule TAKE 1 TO 2 CAPSULES BY MOUTH AT BEDTIME AS NEEDED FOR SLEEP   vitamin B-12 (CYANOCOBALAMIN ) 1000 MCG tablet Take 2,000 mcg by mouth daily.   Methylnaltrexone  Bromide (RELISTOR ) 150 MG TABS 1 po qd   No facility-administered encounter medications on file as of 08/28/2023.    Allergies (verified) Alprazolam , Crestor  [rosuvastatin  calcium ], and Semaglutide    History: Past Medical History:  Diagnosis Date   Allergic rhinitis    Anxiety    takes Valium  bid   Arthritis    "hips, knees, hands" (07/07/2013)   Back pain    occasionally but reason unknown   BPH (benign prostatic hypertrophy)    takes Proscar  daily   Chronic fatigue    Complication of anesthesia    Per pt, "Hard to sedate" past last colonoscopy in 2008!   Depression    takes Cymbalta  daily   Diverticulosis    Dizziness    due to dehydration   Hemorrhoids    Hyperlipidemia    takes Lovastatin  daily   Hypertension    Insomnia    takes Restoril  nightly    Joint pain    knees/ankles/back pain   Joint swelling    Muscle pain    takes Baclofen  daily   Night muscle spasms    takes Valium  as needed   Pneumonia 1971   Right knee DJD    Urinary frequency    takes rapaflo daily   Vitamin B12 deficiency 2009   Vitamin D  deficiency disease    Past Surgical History:  Procedure Laterality Date   COLONOSCOPY     JOINT REPLACEMENT     both knees   KNEE ARTHROSCOPY Bilateral 2012   PROSTATE BIOPSY  01/2010   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   Procedure: TOTAL KNEE ARTHROPLASTY- left;  Surgeon: Genevie Kerns, MD;  Location: [facility] OR;  Service: Orthopedics;  Laterality: Left;   TOTAL KNEE ARTHROPLASTY Right 07/07/2013   TOTAL KNEE ARTHROPLASTY Right 07/07/2013   Procedure: TOTAL KNEE ARTHROPLASTY- right;  Surgeon: Genevie Kerns, MD;  Location: [facility] OR;  Service: Orthopedics;  Laterality: Right;   Family  History  Problem Relation Age of Onset   Liver cancer Mother    Heart failure Father    Kidney disease Father    Atrial fibrillation Father    Coronary artery disease Father    Hypertension Other    Coronary artery disease Other    Dementia Sister    Social History   Socioeconomic History   Marital status: Married    Spouse name: Mary   Number of children: Not on file   Years of education: Not on file   Highest education level: Some college, no degree  Occupational  History   Occupation: Photographer: SELF-EMPLOYED   Occupation: RETIRED  Tobacco Use   Smoking status: Former    Current packs/day: 0.00    Average packs/day: 1 pack/day for 38.0 years (38.0 ttl pk-yrs)    Types: Cigarettes    Start date: 10/24/1967    Quit date: 10/23/2005    Years since quitting: 17.8   Smokeless tobacco: Never   Tobacco comments:    quit smoking 2008  Vaping Use   Vaping status: Never Used  Substance and Sexual Activity   Alcohol use: Yes    Alcohol/week: 14.0 standard drinks of alcohol    Types: 14 Cans of beer per week    Comment: 03/09/17 "2 beers daily"   Drug use: No    Comment: quit age 81   Sexual activity: Not Currently  Other Topics Concern   Not on file  Social History Narrative   Live with wife, Mary/2025   Education: some college   Retired   No children   Caffeine- 3 daily   Social Drivers of Corporate investment banker Strain: Low Risk  (08/28/2023)   Overall Financial Resource Strain (CARDIA)    Difficulty of Paying Living Expenses: Not hard at all  Food Insecurity: No Food Insecurity (08/28/2023)   Hunger Vital Sign    Worried About Running Out of Food in the Last Year: Never true    Ran Out of Food in the Last Year: Never true  Transportation Needs: No Transportation Needs (08/28/2023)   PRAPARE - Administrator, Civil Service (Medical): No    Lack of Transportation (Non-Medical): No  Physical Activity: Inactive (08/28/2023)    Exercise Vital Sign    Days of Exercise per Week: 0 days    Minutes of Exercise per Session: 0 min  Stress: No Stress Concern Present (08/28/2023)   [employer] of Occupational Health - Occupational Stress Questionnaire    Feeling of Stress : Not at all  Social Connections: Socially Isolated (08/28/2023)   Social Connection and Isolation Panel [NHANES]    Frequency of Communication with Friends and Family: Twice a week    Frequency of Social Gatherings with Friends and Family: Never    Attends Religious Services: Never    Database administrator or Organizations: No    Attends Engineer, structural: Never    Marital Status: Married    Tobacco Counseling Counseling given: Not Answered Tobacco comments: quit smoking 2008    Clinical Intake:  Pre-visit preparation completed: Yes  Pain : 0-10 Pain Score: 6  Pain Type: Chronic pain Pain Location: Back Pain Orientation: Left Pain Descriptors / Indicators: Aching, Discomfort Pain Onset: More than a month ago Pain Frequency: Constant Pain Relieving Factors: Gabapentin , patch 25 mg, Oxycodone  Effect of Pain on Daily Activities: walking, standing  Pain Relieving Factors: Gabapentin , patch 25 mg, Oxycodone   BMI - recorded: 31.27 Nutritional Status: BMI > 30  Obese Nutritional Risks: None Diabetes: Yes CBG done?: No Did pt. bring in CBG monitor from home?: No  Lab Results  Component Value Date   HGBA1C 6.2 03/29/2023   HGBA1C 5.7 12/14/2020   HGBA1C 5.5 09/26/2018     How often do you need to have someone help you when you read instructions, pamphlets, or other written materials from your doctor or pharmacy?: 1 - Never  Interpreter Needed?: No  Information entered by :: Sanya Kobrin, RMA   Activities of Daily Living  08/24/2023    9:53 PM  In your present state of health, do you have any difficulty performing the following activities:  Hearing? 0  Vision? 0  Difficulty concentrating or making  decisions? 1  Walking or climbing stairs? 1  Dressing or bathing? 1  Doing errands, shopping? 0  Preparing Food and eating ? N  Using the Toilet? Y  In the past six months, have you accidently leaked urine? Y  Do you have problems with loss of bowel control? N  Managing your Medications? N  Managing your Finances? N  Housekeeping or managing your Housekeeping? Y    Patient Care Team: Plotnikov, Oakley Bellman, MD as PCP - General Sheryle Donning, MD as PCP - Cardiology (Cardiology) Elly Habermann, MD (Orthopedic Surgery) Sheryle Donning, MD as Consulting Physician (Cardiology) Christina Coyer, MD as Consulting Physician (Urology)  I have updated your Care Teams any recent Medical Services you may have received from other providers in the past year.     Assessment:    This is a routine wellness examination for Lee Owens.  Hearing/Vision screen Vision Screening - Comments:: Wears eyeglasses/ Lee Owens   Goals Addressed   None    Depression Screen     08/28/2023    1:58 PM 05/08/2023   10:59 AM 04/04/2023    1:39 PM 01/02/2023    1:35 PM 07/05/2022    2:33 PM 07/05/2022    2:32 PM 04/05/2022    1:26 PM  PHQ 2/9 Scores  PHQ - 2 Score 3 0 0 0 0 0 0  PHQ- 9 Score 3 0 0 0 0      Fall Risk     08/24/2023    9:53 PM 05/08/2023   10:58 AM 04/04/2023    1:21 PM 01/02/2023    1:35 PM 07/05/2022    2:32 PM  Fall Risk   Falls in the past year? 1 0 0 0 0  Number falls in past yr: 1 0 0 0 0  Injury with Fall? 0 0 0 0 0  Risk for fall due to : Medication side effect No Fall Risks  No Fall Risks No Fall Risks  Follow up Falls evaluation completed;Falls prevention discussed Falls evaluation completed Falls evaluation completed Falls evaluation completed Falls evaluation completed    MEDICARE RISK AT HOME:  Medicare Risk at Home Any stairs in or around the home?: (Patient-Rptd) Yes If so, are there any without handrails?: (Patient-Rptd) No Home free of loose throw rugs in  walkways, pet beds, electrical cords, etc?: (Patient-Rptd) Yes Adequate lighting in your home to reduce risk of falls?: (Patient-Rptd) Yes Life alert?: (Patient-Rptd) No Use of a cane, walker or w/c?: (Patient-Rptd) Yes Grab bars in the bathroom?: (Patient-Rptd) Yes Shower chair or bench in shower?: (Patient-Rptd) Yes Elevated toilet seat or a handicapped toilet?: (Patient-Rptd) Yes  TIMED UP AND GO:  Was the test performed?  No  Cognitive Function: Declined/Normal: No cognitive concerns noted by patient or family. Patient alert, oriented, able to answer questions appropriately and recall recent events. No signs of memory loss or confusion.        Immunizations Immunization History  Administered Date(s) Administered   Fluad Quad(high Dose 65+) 12/27/2018, 12/25/2019, 01/09/2022   H1N1 03/06/2008   Influenza Split 12/27/2011   Influenza Whole 01/14/2008, 01/22/2009   Influenza, High Dose Seasonal PF 01/03/2017, 12/31/2017   Influenza,inj,Quad PF,6+ Mos 12/26/2012, 12/30/2013, 01/06/2015, 01/05/2016   Influenza-Unspecified 12/24/2020   PFIZER Comirnaty(Gray Top)Covid-19 Tri-Sucrose Vaccine 08/17/2020   PFIZER(Purple  Top)SARS-COV-2 Vaccination 05/11/2019, 06/03/2019, 01/06/2020   Pneumococcal Conjugate-13 07/05/2016   Pneumococcal Polysaccharide-23 03/26/2019   Td 04/28/2010   Zoster, Unspecified 01/21/2014    Screening Tests Health Maintenance  Topic Date Due   Medicare Annual Wellness (AWV)  Never done   FOOT EXAM  Never done   OPHTHALMOLOGY EXAM  Never done   DTaP/Tdap/Td (2 - Tdap) 04/28/2020   Colonoscopy  10/11/2021   COVID-19 Vaccine (5 - 2024-25 season) 11/26/2022   HEMOGLOBIN A1C  09/26/2023   INFLUENZA VACCINE  10/26/2023   Diabetic kidney evaluation - Urine ACR  03/28/2024   Diabetic kidney evaluation - eGFR measurement  05/07/2024   Pneumonia Vaccine 52+ Years old  Completed   Hepatitis C Screening  Completed   HPV VACCINES  Aged Out   Meningococcal B  Vaccine  Aged Out   Zoster Vaccines- Shingrix  Discontinued    Health Maintenance  Health Maintenance Due  Topic Date Due   Medicare Annual Wellness (AWV)  Never done   FOOT EXAM  Never done   OPHTHALMOLOGY EXAM  Never done   DTaP/Tdap/Td (2 - Tdap) 04/28/2020   Colonoscopy  10/11/2021   COVID-19 Vaccine (5 - 2024-25 season) 11/26/2022   Health Maintenance Items Addressed: Referral sent to GI for colonoscopy, Diabetic Foot Exam recommended, See Nurse Notes at the end of this note  Additional Screening:  Vision Screening: Recommended annual ophthalmology exams for early detection of glaucoma and other disorders of the eye. Would you like a referral to an eye doctor? No    Dental Screening: Recommended annual dental exams for proper oral hygiene  Community Resource Referral / Chronic Care Management: CRR required this visit?  No   CCM required this visit?  No   Plan:    I have personally reviewed and noted the following in the patient's chart:   Medical and social history Use of alcohol, tobacco or illicit drugs  Current medications and supplements including opioid prescriptions. Patient is currently taking opioid prescriptions. Information provided to patient regarding non-opioid alternatives. Patient advised to discuss non-opioid treatment plan with their provider. Functional ability and status Nutritional status Physical activity Advanced directives List of other physicians Hospitalizations, surgeries, and ER visits in previous 12 months Vitals Screenings to include cognitive, depression, and falls Referrals and appointments  In addition, I have reviewed and discussed with patient certain preventive protocols, quality metrics, and best practice recommendations. A written personalized care plan for preventive services as well as general preventive health recommendations were provided to patient.   Lee Owens, CMA   08/28/2023   After Visit Summary: (MyChart)  Due to this being a telephonic visit, the after visit summary with patients personalized plan was offered to patient via MyChart   Notes: Please refer to Routing Comments.   Medical screening examination/treatment/procedure(s) were performed by non-physician practitioner and as supervising physician I was immediately available for consultation/collaboration.  I agree with above. Adelaide Holy, MD

## 2023-09-10 ENCOUNTER — Telehealth: Payer: Self-pay | Admitting: Internal Medicine

## 2023-09-10 NOTE — Telephone Encounter (Signed)
 Copied from CRM [id number]. Topic: General - Call Back - No Documentation >> Sep 10, 2023 11:52 AM Lajean Pike wrote: Reason for CRM: Debria Fang from [facility] was calling in to see if a fax was received. He stated to have sent the fax on the 6th and the 11th. Verified fax number with him. Andrew's contact is [phone].

## 2023-09-11 ENCOUNTER — Telehealth: Payer: Self-pay | Admitting: Internal Medicine

## 2023-09-11 NOTE — Telephone Encounter (Unsigned)
 Copied from CRM [id number]. Topic: Clinical - Medication Question >> Sep 11, 2023  2:24 PM Precious C wrote: Reason for CRM: Pt spouse Adriana Hopping called to because patient Lee Owens) has been experiencing diarrhea since early Sunday morning 09/09/23, stated he has tried OTC medications and it's not working and whenever he tries eating anything he has another episode.... wants to know if there is any suggestions moving forward. Would like a call back @ [phone].

## 2023-09-13 NOTE — Telephone Encounter (Signed)
 Pls see any available provider this wk Use Imodium AD 2 tablets po qid and Pepto-bismol prn Hydrate well Thx

## 2023-09-14 NOTE — Telephone Encounter (Signed)
 Spoke with pts wife and was bale to inform her of providers advice. Pts wife has stated understanding and states pt is doing way better. If he back tracks or gets worse she will call to make an appointment but has refused one at this time.

## 2023-09-25 ENCOUNTER — Other Ambulatory Visit: Payer: Self-pay | Admitting: Internal Medicine

## 2023-10-02 ENCOUNTER — Encounter: Payer: Self-pay | Admitting: Internal Medicine

## 2023-10-02 ENCOUNTER — Ambulatory Visit (INDEPENDENT_AMBULATORY_CARE_PROVIDER_SITE_OTHER): Admitting: Internal Medicine

## 2023-10-02 VITALS — BP 118/80 | HR 80 | Temp 98.3°F | Ht 73.0 in | Wt 239.2 lb

## 2023-10-02 DIAGNOSIS — E669 Obesity, unspecified: Secondary | ICD-10-CM | POA: Diagnosis not present

## 2023-10-02 DIAGNOSIS — M5441 Lumbago with sciatica, right side: Secondary | ICD-10-CM | POA: Diagnosis not present

## 2023-10-02 DIAGNOSIS — F39 Unspecified mood [affective] disorder: Secondary | ICD-10-CM | POA: Diagnosis not present

## 2023-10-02 DIAGNOSIS — E119 Type 2 diabetes mellitus without complications: Secondary | ICD-10-CM | POA: Diagnosis not present

## 2023-10-02 DIAGNOSIS — E559 Vitamin D deficiency, unspecified: Secondary | ICD-10-CM

## 2023-10-02 DIAGNOSIS — I10 Essential (primary) hypertension: Secondary | ICD-10-CM | POA: Diagnosis not present

## 2023-10-02 DIAGNOSIS — G8929 Other chronic pain: Secondary | ICD-10-CM

## 2023-10-02 DIAGNOSIS — I2510 Atherosclerotic heart disease of native coronary artery without angina pectoris: Secondary | ICD-10-CM

## 2023-10-02 DIAGNOSIS — M5442 Lumbago with sciatica, left side: Secondary | ICD-10-CM

## 2023-10-02 LAB — CBC WITH DIFFERENTIAL/PLATELET
Basophils Absolute: 0 K/uL (ref 0.0–0.1)
Basophils Relative: 0.7 % (ref 0.0–3.0)
Eosinophils Absolute: 0.2 K/uL (ref 0.0–0.7)
Eosinophils Relative: 3.1 % (ref 0.0–5.0)
HCT: 43.3 % (ref 39.0–52.0)
Hemoglobin: 14.9 g/dL (ref 13.0–17.0)
Lymphocytes Relative: 19.7 % (ref 12.0–46.0)
Lymphs Abs: 1.2 K/uL (ref 0.7–4.0)
MCHC: 34.3 g/dL (ref 30.0–36.0)
MCV: 92.3 fl (ref 78.0–100.0)
Monocytes Absolute: 0.5 K/uL (ref 0.1–1.0)
Monocytes Relative: 7.8 % (ref 3.0–12.0)
Neutro Abs: 4.2 K/uL (ref 1.4–7.7)
Neutrophils Relative %: 68.7 % (ref 43.0–77.0)
Platelets: 201 K/uL (ref 150.0–400.0)
RBC: 4.69 Mil/uL (ref 4.22–5.81)
RDW: 14.4 % (ref 11.5–15.5)
WBC: 6.1 K/uL (ref 4.0–10.5)

## 2023-10-02 LAB — COMPREHENSIVE METABOLIC PANEL WITH GFR
ALT: 23 U/L (ref 0–53)
AST: 18 U/L (ref 0–37)
Albumin: 4.3 g/dL (ref 3.5–5.2)
Alkaline Phosphatase: 61 U/L (ref 39–117)
BUN: 12 mg/dL (ref 6–23)
CO2: 31 meq/L (ref 19–32)
Calcium: 9.6 mg/dL (ref 8.4–10.5)
Chloride: 100 meq/L (ref 96–112)
Creatinine, Ser: 1.06 mg/dL (ref 0.40–1.50)
GFR: 70.12 mL/min (ref >60.00)
Glucose, Bld: 98 mg/dL (ref 70–99)
Potassium: 3.9 meq/L (ref 3.5–5.1)
Sodium: 139 meq/L (ref 135–145)
Total Bilirubin: 0.7 mg/dL (ref 0.2–1.2)
Total Protein: 6.7 g/dL (ref 6.0–8.3)

## 2023-10-02 LAB — TSH: TSH: 1.06 u[IU]/mL (ref 0.35–5.50)

## 2023-10-02 LAB — CK: Total CK: 58 U/L (ref 7–232)

## 2023-10-02 MED ORDER — FENTANYL 25 MCG/HR TD PT72: 1.0000 | MEDICATED_PATCH | TRANSDERMAL | 0 refills | Status: DC

## 2023-10-02 MED ORDER — DIAZEPAM 10 MG PO TABS: ORAL_TABLET | ORAL | 3 refills | Status: DC

## 2023-10-02 MED ORDER — OXYCODONE HCL 10 MG PO TABS: 10.0000 mg | ORAL_TABLET | Freq: Four times a day (QID) | ORAL | 0 refills | Status: DC | PRN

## 2023-10-02 MED ORDER — NARCAN 4 MG/0.1ML NA LIQD: 0.4000 mg | NASAL | 3 refills | Status: AC | PRN

## 2023-10-02 NOTE — Assessment & Plan Note (Signed)
Monitor HgbA1c

## 2023-10-02 NOTE — Assessment & Plan Note (Signed)
 Depression, situational On Cymbalta 

## 2023-10-02 NOTE — Assessment & Plan Note (Signed)
Continue on: Oxy 10 mg qid prn Fentanyl q 48 h Narcane prn  Potential benefits of a long term opioids use as well as potential risks (i.e. addiction risk, apnea etc) and complications (i.e. Somnolence, constipation and others) were explained to the patient and were aknowledged

## 2023-10-02 NOTE — Assessment & Plan Note (Signed)
 Wt Readings from Last 3 Encounters:  10/02/23 239 lb 3.2 oz (108.5 kg)  08/28/23 237 lb (107.5 kg)  07/03/23 237 lb (107.5 kg)

## 2023-10-02 NOTE — Assessment & Plan Note (Signed)
Continue with current prescription therapy as reflected on the Med list.  

## 2023-10-02 NOTE — Assessment & Plan Note (Signed)
 CT Ca score 142 Dr. Lonni

## 2023-10-02 NOTE — Progress Notes (Signed)
 Subjective:  Patient ID: Lee Owens, male    DOB: [birth year]  Age: 73 y.o. MRN: [id number]  CC: Medical Management of Chronic Issues (3 mnth f/u)   HPI Lee Owens presents for chronic pain, myalgias, depression  Outpatient Medications Prior to Visit  Medication Sig Dispense Refill   allopurinol  (ZYLOPRIM ) 300 MG tablet TAKE 1 TABLET(300 MG) BY MOUTH DAILY 90 tablet 3   Cholecalciferol  (VITAMIN D -3) 5000 UNITS TABS Take 5,000 Units by mouth daily.     Coenzyme Q10 (COQ10) 400 MG CAPS 1 po qd 100 capsule 3   DULoxetine  (CYMBALTA ) 60 MG capsule TAKE 1 CAPSULE BY MOUTH TWICE DAILY 180 capsule 1   finasteride  (PROSCAR ) 5 MG tablet Take 5 mg by mouth daily.     furosemide  (LASIX ) 40 MG tablet TAKE 1/2 TO 1 TABLET(20 TO 40 MG) BY MOUTH DAILY AS NEEDED 30 tablet 5   gabapentin  (NEURONTIN ) 300 MG capsule TAKE 1 CAPSULE(300 MG) BY MOUTH THREE TIMES DAILY 270 capsule 1   losartan  (COZAAR ) 25 MG tablet Take 12.5 mg by mouth at bedtime.     lovastatin  (MEVACOR ) 40 MG tablet TAKE 1 TABLET(40 MG) BY MOUTH AT BEDTIME 90 tablet 3   tamsulosin  (FLOMAX ) 0.4 MG CAPS capsule Take 1 capsule by mouth daily.  2   temazepam  (RESTORIL ) 30 MG capsule TAKE 1 TO 2 CAPSULES BY MOUTH AT BEDTIME AS NEEDED FOR SLEEP 180 capsule 1   vitamin B-12 (CYANOCOBALAMIN ) 1000 MCG tablet Take 2,000 mcg by mouth daily.     diazepam  (VALIUM ) 10 MG tablet TAKE 1/2 TO 1 TABLET(5 TO 10 MG) BY MOUTH EVERY 8 HOURS AS NEEDED FOR ANXIETY OR MUSCLE SPASMS 90 tablet 3   fentaNYL  (DURAGESIC ) 25 MCG/HR Place 1 patch onto the skin every other day. 15 patch 0   fentaNYL  (DURAGESIC ) 25 MCG/HR Place 1 patch onto the skin every other day. 15 patch 0   fentaNYL  (DURAGESIC ) 25 MCG/HR Place 1 patch onto the skin every other day. 5 patch 0   NARCAN  4 MG/0.1ML LIQD nasal spray kit Place 0.1 sprays (0.4 mg total) into the nose as needed. 2 each 3   Oxycodone  HCl 10 MG TABS Take 1 tablet (10 mg total) by mouth every 6 (six) hours as needed. 120  tablet 0   Oxycodone  HCl 10 MG TABS Take 1 tablet (10 mg total) by mouth every 6 (six) hours as needed. 120 tablet 0   Oxycodone  HCl 10 MG TABS Take 1 tablet (10 mg total) by mouth every 6 (six) hours as needed. 120 tablet 0   Methylnaltrexone  Bromide (RELISTOR ) 150 MG TABS 1 po qd 90 tablet 3   No facility-administered medications prior to visit.    ROS: Review of Systems  Constitutional:  Negative for appetite change, fatigue and unexpected weight change.  HENT:  Negative for congestion, nosebleeds, sneezing, sore throat and trouble swallowing.   Eyes:  Negative for itching and visual disturbance.  Respiratory:  Negative for cough.   Cardiovascular:  Negative for chest pain, palpitations and leg swelling.  Gastrointestinal:  Negative for abdominal distention, blood in stool, diarrhea and nausea.  Genitourinary:  Negative for frequency and hematuria.  Musculoskeletal:  Positive for arthralgias, back pain, gait problem and myalgias. Negative for joint swelling and neck pain.  Skin:  Negative for rash and wound.  Neurological:  Negative for dizziness, tremors, speech difficulty and weakness.  Hematological:  Does not bruise/bleed easily.  Psychiatric/Behavioral:  Negative for agitation, confusion, dysphoric mood,  sleep disturbance and suicidal ideas. The patient is nervous/anxious.     Objective:  BP 118/80   Pulse 80   Temp 98.3 F (36.8 C) (Oral)   Ht 6' 1 (1.854 m)   Wt 239 lb 3.2 oz (108.5 kg)   SpO2 93%   BMI 31.56 kg/m   BP Readings from Last 3 Encounters:  10/02/23 118/80  07/03/23 138/84  05/08/23 110/86    Wt Readings from Last 3 Encounters:  10/02/23 239 lb 3.2 oz (108.5 kg)  08/28/23 237 lb (107.5 kg)  07/03/23 237 lb (107.5 kg)    Physical Exam Constitutional:      General: He is not in acute distress.    Appearance: He is well-developed. He is obese.     Comments: NAD  Eyes:     Conjunctiva/sclera: Conjunctivae normal.     Pupils: Pupils are equal,  round, and reactive to light.  Neck:     Thyroid : No thyromegaly.     Vascular: No JVD.  Cardiovascular:     Rate and Rhythm: Normal rate and regular rhythm.     Heart sounds: Normal heart sounds. No murmur heard.    No friction rub. No gallop.  Pulmonary:     Effort: Pulmonary effort is normal. No respiratory distress.     Breath sounds: Normal breath sounds. No wheezing or rales.  Chest:     Chest wall: No tenderness.  Abdominal:     General: Bowel sounds are normal. There is no distension.     Palpations: Abdomen is soft. There is no mass.     Tenderness: There is no abdominal tenderness. There is no guarding or rebound.  Musculoskeletal:        General: Tenderness present. Normal range of motion.     Cervical back: Normal range of motion.     Right lower leg: No edema.     Left lower leg: No edema.  Lymphadenopathy:     Cervical: No cervical adenopathy.  Skin:    General: Skin is warm and dry.     Findings: No rash.  Neurological:     Mental Status: He is alert and oriented to person, place, and time.     Cranial Nerves: No cranial nerve deficit.     Motor: No weakness or abnormal muscle tone.     Coordination: Coordination normal.     Gait: Gait abnormal.     Deep Tendon Reflexes: Reflexes are normal and symmetric.  Psychiatric:        Behavior: Behavior normal.        Thought Content: Thought content normal.        Judgment: Judgment normal.     Lab Results  Component Value Date   WBC 6.6 05/08/2023   HGB 15.4 05/08/2023   HCT 44.6 05/08/2023   PLT 222.0 05/08/2023   GLUCOSE 117 (H) 05/08/2023   CHOL 152 03/29/2023   TRIG 104.0 03/29/2023   HDL 49.30 03/29/2023   LDLCALC 82 03/29/2023   ALT 33 05/08/2023   AST 25 05/08/2023   NA 139 05/08/2023   K 4.5 05/08/2023   CL 100 05/08/2023   CREATININE 0.98 05/08/2023   BUN 7 05/08/2023   CO2 32 05/08/2023   TSH 1.38 03/29/2023   PSA 0.76 06/29/2016   INR 0.96 06/30/2013   HGBA1C 6.2 03/29/2023    US   Abdomen Complete Result Date: 05/11/2023 CLINICAL DATA:  Flank pain for 3 weeks EXAM: ABDOMEN ULTRASOUND COMPLETE COMPARISON:  None Available.  FINDINGS: Gallbladder: Dilated gallbladder.  No shadowing stones. Common bile duct: Diameter: 4 mm Liver: Diffusely echogenic hepatic parenchyma consistent with fatty liver infiltration. Portal vein is patent on color Doppler imaging with normal direction of blood flow towards the liver. IVC: No abnormality visualized. Pancreas: Partially obscured by overlapping bowel gas and soft tissue. Spleen: Size and appearance within normal limits. Right Kidney: Length: 11.0 cm. Echogenicity within normal limits. No mass or hydronephrosis visualized. Left Kidney: Length: 11.4 cm. Echogenicity within normal limits. No mass or hydronephrosis visualized. Abdominal aorta: No aneurysm visualized. Other findings: None. IMPRESSION: No gallstones or ductal dilatation.  Fatty liver infiltration Electronically Signed   By: Ranell Bring M.D.   On: 05/11/2023 10:18    Assessment & Plan:   Problem List Items Addressed This Visit     Vitamin D  deficiency - Primary   Refractory - weekly Vit D 50000 iu      RESOLVED: Essential hypertension   Continue with current prescription therapy as reflected on the Med list.       Relevant Orders   CBC with Differential/Platelet   Comprehensive metabolic panel with GFR   TSH   CK   Diabetes mellitus, type 2 (HCC)   Monitor Hgb A1c       Relevant Orders   CBC with Differential/Platelet   Comprehensive metabolic panel with GFR   TSH   CK   Low back pain   Continue on: Oxy 10 mg qid prn Fentanyl  q 48 h Narcane prn  Potential benefits of a long term opioids use as well as potential risks (i.e. addiction risk, apnea etc) and complications (i.e. 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 Lonni      Relevant Orders   CBC with Differential/Platelet   Comprehensive metabolic panel with GFR   TSH   CK      Meds ordered this encounter  Medications   fentaNYL  (DURAGESIC ) 25 MCG/HR    Sig: Place 1 patch onto the skin every other day.    Dispense:  15 patch    Refill:  0    Please fill on or after 12/03/23. Code: M54.50   Oxycodone  HCl 10 MG TABS    Sig: Take 1 tablet (10 mg total) by mouth every 6 (six) hours as needed.    Dispense:  120 tablet    Refill:  0    Please fill on or after 12/03/23. Code: M54.50   NARCAN  4 MG/0.1ML LIQD nasal spray kit    Sig: Place 0.1 sprays (0.4 mg total) into the nose as needed.    Dispense:  2 each    Refill:  3   diazepam  (VALIUM ) 10 MG tablet    Sig: TAKE 1/2 TO 1 TABLET(5 TO 10 MG) BY MOUTH EVERY 8 HOURS AS  NEEDED FOR ANXIETY OR MUSCLE SPASMS    Dispense:  90 tablet    Refill:  3   fentaNYL  (DURAGESIC ) 25 MCG/HR    Sig: Place 1 patch onto the skin every other day.    Dispense:  15 patch    Refill:  0    Please fill on or after 10/04/23. Code: M54.50   fentaNYL  (DURAGESIC ) 25 MCG/HR    Sig: Place 1 patch onto the skin every other day.    Dispense:  5 patch    Refill:  0    Please fill on or after 11/03/23. Code: M54.50   Oxycodone  HCl 10 MG TABS    Sig: Take 1 tablet (10 mg total) by mouth every 6 (six) hours as needed.    Dispense:  120 tablet    Refill:  0    Please fill on or after 10/04/23. Code: M54.50   Oxycodone  HCl 10 MG TABS    Sig: Take 1 tablet (10 mg total) by mouth every 6 (six) hours as needed.     Dispense:  120 tablet    Refill:  0    Please fill on or after 11/03/23. Code: M54.50      Follow-up: Return in about 3 months (around 01/02/2024) for a follow-up visit.  Marolyn Noel, MD

## 2023-10-02 NOTE — Assessment & Plan Note (Addendum)
 Refractory - weekly Vit D 50000 iu

## 2023-10-05 ENCOUNTER — Ambulatory Visit: Payer: Self-pay | Admitting: Internal Medicine

## 2023-10-11 ENCOUNTER — Telehealth: Payer: Self-pay

## 2023-10-11 NOTE — Telephone Encounter (Signed)
 Copied from CRM [id number]. Topic: General - Other >> Oct 11, 2023  2:46 PM [place] E wrote: Reason for CRM: Prentice from [facility] called in stating that he faxed over a requisition form for a neurological screening for patient on 7/3, 7/9, and 7/14. Callback number for Prentice is [phone].

## 2023-10-16 DIAGNOSIS — M5459 Other low back pain: Secondary | ICD-10-CM | POA: Diagnosis not present

## 2023-10-29 ENCOUNTER — Telehealth: Payer: Self-pay

## 2023-10-29 NOTE — Telephone Encounter (Signed)
 Copied from CRM [id number]. Topic: Clinical - Prescription Issue >> Oct 29, 2023 12:29 PM Drema MATSU wrote: Reason for CRM: Patient wife stated that she was contacted by the pharmacy to advise her that only one box (5 patches) of fentaNYL  (DURAGESIC ) 25 MCG/HR was ordered instead of 3 box(15 patches). She is needing the other 2 boxes sent to pharmacy. He will be out on Sunday.

## 2023-10-30 NOTE — Telephone Encounter (Signed)
 Patient's pharmacy is calling and would like to know if it was intentional to send in 5 patches, because the other prescriptions for the same medication are 15 patches. Please advise.

## 2023-10-30 NOTE — Telephone Encounter (Signed)
 Copied from CRM #[id number]. Topic: Clinical - Prescription Issue >> Oct 30, 2023  9:33 AM Precious C wrote: Reason for CRM: Lucie from [facility] pharmacy called in regarding the patient's fentanyl  prescription. She stated that on July 8, the patient received 15 patches total-two full boxes and one box containing only 5 patches-when he typically receives 15 patches per box. She called to confirm if there was an issue with the prescription. I reached out to CAL, who reviewed the medication and confirmed that there may be a possible issue. CAL advised that a note was already sent yesterday afternoon, as the patient's wife had called in to report the same concern. They mentioned the issue may be addressed today.

## 2023-11-01 MED ORDER — FENTANYL 25 MCG/HR TD PT72: 1.0000 | MEDICATED_PATCH | TRANSDERMAL | 0 refills | Status: DC

## 2023-11-01 NOTE — Telephone Encounter (Signed)
 It is probably a typo.  15 is correct. Done Thanks

## 2023-11-07 DIAGNOSIS — M5459 Other low back pain: Secondary | ICD-10-CM | POA: Diagnosis not present

## 2023-11-12 DIAGNOSIS — Z23 Encounter for immunization: Secondary | ICD-10-CM | POA: Diagnosis not present

## 2023-11-13 ENCOUNTER — Encounter: Payer: Self-pay | Admitting: Physician Assistant

## 2023-11-13 ENCOUNTER — Encounter: Payer: Self-pay | Admitting: Gastroenterology

## 2023-11-13 ENCOUNTER — Ambulatory Visit (INDEPENDENT_AMBULATORY_CARE_PROVIDER_SITE_OTHER): Admitting: Physician Assistant

## 2023-11-13 ENCOUNTER — Other Ambulatory Visit: Payer: Self-pay | Admitting: Internal Medicine

## 2023-11-13 VITALS — BP 114/68 | HR 91 | Ht 73.0 in | Wt 240.1 lb

## 2023-11-13 DIAGNOSIS — K5903 Drug induced constipation: Secondary | ICD-10-CM

## 2023-11-13 DIAGNOSIS — K5909 Other constipation: Secondary | ICD-10-CM | POA: Diagnosis not present

## 2023-11-13 DIAGNOSIS — F1120 Opioid dependence, uncomplicated: Secondary | ICD-10-CM

## 2023-11-13 DIAGNOSIS — Z79891 Long term (current) use of opiate analgesic: Secondary | ICD-10-CM

## 2023-11-13 DIAGNOSIS — Z860101 Personal history of adenomatous and serrated colon polyps: Secondary | ICD-10-CM

## 2023-11-13 DIAGNOSIS — R1084 Generalized abdominal pain: Secondary | ICD-10-CM

## 2023-11-13 DIAGNOSIS — R1310 Dysphagia, unspecified: Secondary | ICD-10-CM | POA: Diagnosis not present

## 2023-11-13 MED ORDER — NA SULFATE-K SULFATE-MG SULF 17.5-3.13-1.6 GM/177ML PO SOLN: 1.0000 | Freq: Once | ORAL | 0 refills | Status: AC

## 2023-11-13 NOTE — Patient Instructions (Signed)
 You have been scheduled for an endoscopy and colonoscopy. Please follow the written instructions given to you at your visit today.  If you use inhalers (even only as needed), please bring them with you on the day of your procedure.  DO NOT TAKE 7 DAYS PRIOR TO TEST- Trulicity  (dulaglutide ) Ozempic, Wegovy (semaglutide) Mounjaro (tirzepatide) Bydureon Bcise (exanatide extended release)  DO NOT TAKE 1 DAY PRIOR TO YOUR TEST Rybelsus (semaglutide) Adlyxin (lixisenatide) Victoza (liraglutide) Byetta (exanatide) _______________________________________________________________________  _______________________________________________________  If your blood pressure at your visit was 140/90 or greater, please contact your primary care physician to follow up on this.  _______________________________________________________  If you are age 41 or older, your body mass index should be between 23-30. Your Body mass index is 31.68 kg/m. If this is out of the aforementioned range listed, please consider follow up with your Primary Care Provider.  If you are age 5 or younger, your body mass index should be between 19-25. Your Body mass index is 31.68 kg/m. If this is out of the aformentioned range listed, please consider follow up with your Primary Care Provider.   ________________________________________________________  The [facility] providers would like to encourage you to use MYCHART to communicate with providers for non-urgent requests or questions.  Due to long hold times on the telephone, sending your provider a message by [facility] may be a faster and more efficient way to get a response.  Please allow 48 business hours for a response.  Please remember that this is for non-urgent requests.  _______________________________________________________  [facility] is using a team-based approach to care.  Your team is made up of your doctor and two to three APPS. Our APPS (Nurse  Practitioners and Physician Assistants) work with your physician to ensure care continuity for you. They are fully qualified to address your health concerns and develop a treatment plan. They communicate directly with your gastroenterologist to care for you. Seeing the Advanced Practice Practitioners on your physician's team can help you by facilitating care more promptly, often allowing for earlier appointments, access to diagnostic testing, procedures, and other specialty referrals.

## 2023-11-13 NOTE — Progress Notes (Signed)
 Chief Complaint: Bloating, Constipation and Diarrhea  HPI:    Mr. Cornwall is a 73 year old Caucasian male with a past medical history as listed below, who was referred to me by Plotnikov, Karlynn GAILS, MD for a complaint of bloating, constipation and diarrhea.      10/11/2016 colonoscopy with one 5 mm polyp in the transverse colon, moderate diverticulosis in the sigmoid, descending and transverse colon and nonbleeding internal hemorrhoids.  Pathology showed tubular adenoma and repeat recommended in 5 years.  (Changed to 7 years under new protocol).    Today, the patient presents to clinic accompanied by his wife who used to work as a Engineer, civil (consulting).  They explain that patient has been having extreme abdominal pain at times sometimes even worse than his severe back pain for which he takes Oxycodone  and Fentanyl .  This is described as a sharp or cramping pain rated as a 10/10 that can last for a couple of hours.  They do know that when he was on the Wegovy  at the end of last year this made all of his abdominal pain worse, since stopping that 6 months ago he has still had at least 2 episodes of abdominal pain that were severe and inhibited his life.  They are not sure if this correlated with times of constipation or not.  Constipation is also an issue for him since he is on his chronic pain meds.  They had tried MiraLAX and other laxatives in the past which the patient did not like.  Currently he is using prunes and pineapple supplements and this is working well for him to keep regular bowel movements.  He has not had a recent episode of abdominal pain.  Patient seems to think sometimes the food he eats tends to cause worsened abdominal pain.    Also describes some indigestion and issues when swallowing his pills at times feeling like they get caught in his throat.    Denies fever, chills, weight loss or blood in his stool.   Past Medical History:  Diagnosis Date   Allergic rhinitis    Anxiety    takes Valium  bid    Arthritis    hips, knees, hands (07/07/2013)   Back pain    occasionally but reason unknown   BPH (benign prostatic hypertrophy)    takes Proscar  daily   Chronic fatigue    Complication of anesthesia    Per pt, Hard to sedate past last colonoscopy in 2008!   Depression    takes Cymbalta  daily   Diverticulosis    Dizziness    due to dehydration   Hemorrhoids    Hyperlipidemia    takes Lovastatin  daily   Hypertension    Insomnia    takes Restoril  nightly    Joint pain    knees/ankles/back pain   Joint swelling    Muscle pain    takes Baclofen  daily   Night muscle spasms    takes Valium  as needed   Pneumonia 1971   Right knee DJD    Urinary frequency    takes rapaflo daily   Vitamin B12 deficiency 2009   Vitamin D  deficiency disease     Past Surgical History:  Procedure Laterality Date   COLONOSCOPY     JOINT REPLACEMENT     both knees   KNEE ARTHROSCOPY Bilateral 2012   PROSTATE BIOPSY  01/2010   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   Procedure: TOTAL KNEE ARTHROPLASTY- left;  Surgeon: Lamar DELENA Millman, MD;  Location: [facility]  OR;  Service: Orthopedics;  Laterality: Left;   TOTAL KNEE ARTHROPLASTY Right 07/07/2013   TOTAL KNEE ARTHROPLASTY Right 07/07/2013   Procedure: TOTAL KNEE ARTHROPLASTY- right;  Surgeon: Lamar DELENA Millman, MD;  Location: [facility] OR;  Service: Orthopedics;  Laterality: Right;    Current Outpatient Medications  Medication Sig Dispense Refill   allopurinol  (ZYLOPRIM ) 300 MG tablet TAKE 1 TABLET(300 MG) BY MOUTH DAILY 90 tablet 3   Cholecalciferol  (VITAMIN D -3) 5000 UNITS TABS Take 5,000 Units by mouth daily.     Coenzyme Q10 (COQ10) 400 MG CAPS 1 po qd 100 capsule 3   diazepam  (VALIUM ) 10 MG tablet TAKE 1/2 TO 1 TABLET(5 TO 10 MG) BY MOUTH EVERY 8 HOURS AS NEEDED FOR ANXIETY OR MUSCLE SPASMS 90 tablet 3   DULoxetine  (CYMBALTA ) 60 MG capsule TAKE 1 CAPSULE BY MOUTH TWICE DAILY 180 capsule 1   fentaNYL  (DURAGESIC ) 25 MCG/HR Place 1 patch onto the skin  every other day. 15 patch 0   fentaNYL  (DURAGESIC ) 25 MCG/HR Place 1 patch onto the skin every other day. 15 patch 0   fentaNYL  (DURAGESIC ) 25 MCG/HR Place 1 patch onto the skin every other day. 15 patch 0   finasteride  (PROSCAR ) 5 MG tablet Take 5 mg by mouth daily.     furosemide  (LASIX ) 40 MG tablet TAKE 1/2 TO 1 TABLET(20 TO 40 MG) BY MOUTH DAILY AS NEEDED 30 tablet 5   gabapentin  (NEURONTIN ) 300 MG capsule TAKE 1 CAPSULE(300 MG) BY MOUTH THREE TIMES DAILY 270 capsule 1   losartan  (COZAAR ) 25 MG tablet Take 12.5 mg by mouth at bedtime.     lovastatin  (MEVACOR ) 40 MG tablet TAKE 1 TABLET(40 MG) BY MOUTH AT BEDTIME 90 tablet 3   NARCAN  4 MG/0.1ML LIQD nasal spray kit Place 0.1 sprays (0.4 mg total) into the nose as needed. 2 each 3   Oxycodone  HCl 10 MG TABS Take 1 tablet (10 mg total) by mouth every 6 (six) hours as needed. 120 tablet 0   Oxycodone  HCl 10 MG TABS Take 1 tablet (10 mg total) by mouth every 6 (six) hours as needed. 120 tablet 0   Oxycodone  HCl 10 MG TABS Take 1 tablet (10 mg total) by mouth every 6 (six) hours as needed. 120 tablet 0   tamsulosin  (FLOMAX ) 0.4 MG CAPS capsule Take 1 capsule by mouth daily.  2   temazepam  (RESTORIL ) 30 MG capsule TAKE 1 TO 2 CAPSULES BY MOUTH AT BEDTIME AS NEEDED FOR SLEEP 180 capsule 1   vitamin B-12 (CYANOCOBALAMIN ) 1000 MCG tablet Take 2,000 mcg by mouth daily.     No current facility-administered medications for this visit.    Allergies as of 11/13/2023 - Review Complete 11/13/2023  Allergen Reaction Noted   Alprazolam   07/03/2023   Crestor  [rosuvastatin  calcium ]  09/26/2018   Semaglutide   01/02/2023    Family History  Problem Relation Age of Onset   Liver cancer Mother    Heart failure Father    Kidney disease Father    Atrial fibrillation Father    Coronary artery disease Father    Dementia Sister    Hypertension Other    Coronary artery disease Other     Social History   Socioeconomic History   Marital status: Married     Spouse name: Mary   Number of children: Not on file   Years of education: Not on file   Highest education level: Some college, no degree  Occupational History   Occupation: Chartered certified accountant  Employer: SELF-EMPLOYED   Occupation: RETIRED  Tobacco Use   Smoking status: Former    Current packs/day: 0.00    Average packs/day: 1 pack/day for 38.0 years (38.0 ttl pk-yrs)    Types: Cigarettes    Start date: 10/24/1967    Quit date: 10/23/2005    Years since quitting: 18.0   Smokeless tobacco: Never   Tobacco comments:    quit smoking 2008  Vaping Use   Vaping status: Never Used  Substance and Sexual Activity   Alcohol use: Yes    Alcohol/week: 14.0 standard drinks of alcohol    Types: 14 Cans of beer per week    Comment: 03/09/17 2 beers daily   Drug use: No    Comment: quit age 8   Sexual activity: Not Currently  Other Topics Concern   Not on file  Social History Narrative   Live with wife, Mary/2025   Education: some college   Retired   No children   Caffeine- 3 daily   Social Drivers of Corporate investment banker Strain: Low Risk  (08/28/2023)   Overall Financial Resource Strain (CARDIA)    Difficulty of Paying Living Expenses: Not hard at all  Food Insecurity: No Food Insecurity (08/28/2023)   Hunger Vital Sign    Worried About Running Out of Food in the Last Year: Never true    Ran Out of Food in the Last Year: Never true  Transportation Needs: No Transportation Needs (08/28/2023)   PRAPARE - Administrator, Civil Service (Medical): No    Lack of Transportation (Non-Medical): No  Physical Activity: Inactive (08/28/2023)   Exercise Vital Sign    Days of Exercise per Week: 0 days    Minutes of Exercise per Session: 0 min  Stress: No Stress Concern Present (08/28/2023)   [employer] of Occupational Health - Occupational Stress Questionnaire    Feeling of Stress : Not at all  Social Connections: Socially Isolated (08/28/2023)   Social Connection  and Isolation Panel    Frequency of Communication with Friends and Family: Twice a week    Frequency of Social Gatherings with Friends and Family: Never    Attends Religious Services: Never    Database administrator or Organizations: No    Attends Banker Meetings: Never    Marital Status: Married  Catering manager Violence: Not At Risk (08/28/2023)   Humiliation, Afraid, Rape, and Kick questionnaire    Fear of Current or Ex-Partner: No    Emotionally Abused: No    Physically Abused: No    Sexually Abused: No    Review of Systems:    Constitutional: No weight loss, fever or chills Skin: No rash Cardiovascular: No chest pain Respiratory: No SOB  Gastrointestinal: See HPI and otherwise negative Genitourinary: No dysuria  Neurological: No headache, dizziness or syncope Musculoskeletal: No new muscle or joint pain Hematologic: No bleeding  Psychiatric: No history of depression or anxiety   Physical Exam:  Vital signs: BP 114/68   Pulse 91   Ht 6' 1 (1.854 m)   Wt 240 lb 2 oz (108.9 kg)   SpO2 93%   BMI 31.68 kg/m    Constitutional:   Pleasant Caucasian male appears to be in NAD, Well developed, Well nourished, alert and cooperative Head:  Normocephalic and atraumatic. Eyes:   PEERL, EOMI. No icterus. Conjunctiva pink. Ears:  Normal auditory acuity. Neck:  Supple Throat: Oral cavity and pharynx without inflammation, swelling or lesion.  Respiratory:  Respirations even and unlabored. Lungs clear to auscultation bilaterally.   No wheezes, crackles, or rhonchi.  Cardiovascular: Normal S1, S2. No MRG. Regular rate and rhythm. No peripheral edema, cyanosis or pallor.  Gastrointestinal:  Soft, nondistended, nontender. No rebound or guarding. Normal bowel sounds. No appreciable masses or hepatomegaly. Rectal:  Not performed.  Msk:  Symmetrical without gross deformities. Without edema, no deformity or joint abnormality. +ambulates with cane Neurologic:  Alert and   oriented x4;  grossly normal neurologically.  Skin:   Dry and intact without significant lesions or rashes. Psychiatric:  Demonstrates good judgement and reason without abnormal affect or behaviors.  RELEVANT LABS AND IMAGING: CBC    Component Value Date/Time   WBC 6.1 10/02/2023 1148   RBC 4.69 10/02/2023 1148   HGB 14.9 10/02/2023 1148   HCT 43.3 10/02/2023 1148   PLT 201.0 10/02/2023 1148   MCV 92.3 10/02/2023 1148   MCH 32.6 05/12/2021 2030   MCHC 34.3 10/02/2023 1148   RDW 14.4 10/02/2023 1148   LYMPHSABS 1.2 10/02/2023 1148   MONOABS 0.5 10/02/2023 1148   EOSABS 0.2 10/02/2023 1148   BASOSABS 0.0 10/02/2023 1148    CMP     Component Value Date/Time   NA 139 10/02/2023 1148   NA 138 11/22/2022 1128   K 3.9 10/02/2023 1148   CL 100 10/02/2023 1148   CO2 31 10/02/2023 1148   GLUCOSE 98 10/02/2023 1148   BUN 12 10/02/2023 1148   BUN 8 11/22/2022 1128   CREATININE 1.06 10/02/2023 1148   CALCIUM  9.6 10/02/2023 1148   PROT 6.7 10/02/2023 1148   ALBUMIN 4.3 10/02/2023 1148   AST 18 10/02/2023 1148   ALT 23 10/02/2023 1148   ALKPHOS 61 10/02/2023 1148   BILITOT 0.7 10/02/2023 1148   GFRNONAA >60 05/12/2021 2030   GFRAA 82 05/05/2020 1651    Assessment: 1.  History of adenomatous polyps: Last colonoscopy in 2018 with repeat recommended in 7 years given finding of 1 adenomatous polyps, patient is due now 2.  Chronic constipation: Also describes occasional episodes of diarrhea which could be overflow, likely caused by his chronic oxycodone , currently doing well on prunes and pineapple supplements, does not like the use of MiraLAX or over-the-counter laxatives 3.  Abdominal pain: Somewhat generalized and sharp and can be a 10 out of 10, random per the patient and his wife, they have never paid attention to if he is constipated during these times or has excessive gas or is eaten something long, Pepto-Bismol seems to help and the patient does burp during these episodes; hard  to decipher given the vagueness, consider epigastric origin versus constipation 4.  Dysphagia: Sometimes when swallowing pills the patient has difficulty getting them down; consider esophageal stricture versus ring versus spasm 5.  Chronic back pain: On Oxycodone  and Fentanyl   Plan: 1.  Scheduled patient for diagnostic EGD with dilation as well as a surveillance colonoscopy given history of adenomatous polyps in the [facility] with Dr. Nandigam.  Did provide the patient a detailed list of risks for the procedures and he agrees to proceed. Patient is appropriate for endoscopic procedure(s) in the ambulatory ([facility]) setting.  2.  If the prunes and pineapple stop working for him could try Movantik 3.  Patient will have a 2-day bowel prep given history of constipation 4.  Told the patient and his wife to keep a symptom/food diary log so that we can better tell when this abdominal pain is affecting him.  High suspicion he  may be related to times of constipation 5.  Patient to follow in clinic per recommendations after time of procedures.  Delon Failing, PA-C [facility] 11/13/2023, 1:55 PM  Cc: Plotnikov, Aleksei V, MD

## 2023-11-14 ENCOUNTER — Telehealth: Payer: Self-pay | Admitting: *Deleted

## 2023-11-14 DIAGNOSIS — M545 Low back pain, unspecified: Secondary | ICD-10-CM | POA: Diagnosis not present

## 2023-11-14 NOTE — Telephone Encounter (Signed)
-----   Message from Delon Hendricks Failing sent at 11/13/2023  5:44 PM EDT ----- Regarding: FW: fentanyl  patch See below. Please let pt know Thanks-JLL ----- Message ----- From: Dyan Rush, CRNA Sent: 11/13/2023   4:29 PM EDT To: Delon Hendricks Failing, PA Subject: RE: fentanyl  patch                             Delon Chaney czar can wear  the patch  Thanks,  Rush Dyan ----- Message ----- From: Failing Delon Hendricks, PA Sent: 11/13/2023   2:42 PM EDT To: Rush Dyan, CRNA Subject: fentanyl  patch                                 Patient has a fentanyl  patch that he changes out every 3 days.  Does he need to go without this for scheduled EGD and colonoscopy?  Thanks, JL L

## 2023-11-14 NOTE — Telephone Encounter (Signed)
Left message for patient's wife to call office.  

## 2023-11-15 ENCOUNTER — Ambulatory Visit (HOSPITAL_BASED_OUTPATIENT_CLINIC_OR_DEPARTMENT_OTHER): Admitting: Cardiology

## 2023-11-15 ENCOUNTER — Encounter (HOSPITAL_BASED_OUTPATIENT_CLINIC_OR_DEPARTMENT_OTHER): Payer: Self-pay | Admitting: Cardiology

## 2023-11-15 VITALS — BP 95/66 | HR 73 | Ht 72.0 in | Wt 241.0 lb

## 2023-11-15 DIAGNOSIS — R0989 Other specified symptoms and signs involving the circulatory and respiratory systems: Secondary | ICD-10-CM

## 2023-11-15 DIAGNOSIS — I10 Essential (primary) hypertension: Secondary | ICD-10-CM | POA: Diagnosis not present

## 2023-11-15 DIAGNOSIS — I712 Thoracic aortic aneurysm, without rupture, unspecified: Secondary | ICD-10-CM | POA: Diagnosis not present

## 2023-11-15 DIAGNOSIS — I2510 Atherosclerotic heart disease of native coronary artery without angina pectoris: Secondary | ICD-10-CM

## 2023-11-15 DIAGNOSIS — Z01812 Encounter for preprocedural laboratory examination: Secondary | ICD-10-CM

## 2023-11-15 NOTE — Telephone Encounter (Signed)
 Patient's wife informed he may keep Fentanyl  patch on.

## 2023-11-15 NOTE — Progress Notes (Signed)
 Cardiology Office Note:  .   Date:  11/15/2023  ID:  Lee Owens, DOB 1951/03/19, MRN [id number] PCP: Garald Karlynn GAILS, MD  [facility] Providers Cardiologist:  Shelda Bruckner, MD {  History of Present Illness: .   Lee Owens is a 73 y.o. male with a hx of hypertension, thoracic aortic aneurysm who was initially seen via telemedicine visit 06/25/18. He was referred after calcium  score was done by Dr. Garald.    Cardiac history: he was started on rosuvastatin  10 mg in the past. He was changed back to lovastatin  at his visit with Dr. Garald 09/26/18. He states muscle pain was severe on rosuvastatin , returned to lovastatin . Now tolerating again without issue. Reviewed history, he is unsure of all of the statins he has tried (documentation of simvastatin , unsure about atorvastatin).  Today: Having back and knee issues, following with Dr. Garald. Hasn't had deep chest pain but had superficial sternal pain after he accidentally bruised it, was tender for two months. Now resolved.  Reviewed blood pressure log. Remains variable. Lowest 90s/60s on occasion, does feel lightheaded with these but no syncope. Single high number of 157/85. Most 100s/70s. Gatorade helps when BP is low; doesn't drink much water, mostly Pepsi, tea or beer. Takes losartan  in the evening if systolic >120 or diastolic >80, ends up taking about twice a week. Has been taking 1/2 lasix  daily due to ankle swelling, but has not seen significant improvement, does see improvement with compression stockings.   ROS: Denies shortness of breath at rest or with normal exertion. No PND, orthopnea, LE edema or unexpected weight gain. No syncope or palpitations. ROS otherwise negative except as noted.   Studies Reviewed: SABRA    EKG:  EKG Interpretation Date/Time:  Thursday November 15 2023 11:55:15 EDT Ventricular Rate:  73 PR Interval:  162 QRS Duration:  78 QT Interval:  414 QTC Calculation: 456 R  Axis:   -16  Text Interpretation: Normal sinus rhythm Non-specific ST-t changes Confirmed by Bruckner Shelda [phone]) on 11/15/2023 12:07:46 PM    Physical Exam:   VS:  BP 95/66 Comment: home  Pulse 73   Ht 6' (1.829 m)   Wt 241 lb (109.3 kg)   SpO2 93%   BMI 32.69 kg/m    Wt Readings from Last 3 Encounters:  11/15/23 241 lb (109.3 kg)  11/13/23 240 lb 2 oz (108.9 kg)  10/02/23 239 lb 3.2 oz (108.5 kg)    GEN: Well nourished, well developed in no acute distress HEENT: Normal, moist mucous membranes NECK: No JVD CARDIAC: regular rhythm, normal S1 and S2, no rubs or gallops. No murmur. VASCULAR: Radial and DP pulses 2+ bilaterally. No carotid bruits RESPIRATORY:  Clear to auscultation without rales, wheezing or rhonchi  ABDOMEN: Soft, non-tender, non-distended MUSCULOSKELETAL:  Ambulates independently SKIN: Warm and dry, no edema NEUROLOGIC:  Alert and oriented x 3. No focal neuro deficits noted. PSYCHIATRIC:  Normal affect    ASSESSMENT AND PLAN: .    Hypertension: history of labile blood pressures -reviewed goal of <130/80 given TAA -has labile Bps, both low/high. Exacerbated by pain/stress (runs high) and rapid position changes (runs low) -avoided beta blocker due to issues with depression. Was lightheaded on higher dose ARB, tolerates 12.5 mg dose losartan  but only takes when blood pressure is >120 systolic or >80 diastolic -has PRN lasix  ordered, has been taking daily without improvement. Does improve with compression. Recommended elevation as well. Would avoid lasix  if possible given intermittent low blood  pressures    Coronary calcium  seen on CT, consistent with nonobstructive CAD:  Hyperlipidemia: LDL goal <70 -calcium  score 129/55th percentile -aspirin  81 mg daily, tolerating -did not tolerate rosuvastatin , simvastatin . Tolerating lovastatin  and CoQ10 -LDL 82 on most recent lipids, not at goal but has not tolerated other statins -discussed CV risk management,  below   Thoracic aortic ectasia/aneurysm -CT angio has been stable, last 11/10/22 4.3 cm. Due for recheck, ordered today. Had BMET in July. -discussed importance of good BP control  Dispo: 1 year or sooner as needed  Signed, Shelda Bruckner, MD   Shelda Bruckner, MD, PhD, [facility]  [facility]  [place]  [facility] at [facility] at [facility] [address], [address] [place], [place] [address] [phone]

## 2023-11-15 NOTE — Patient Instructions (Addendum)
 Medication Instructions:  Your physician recommends that you continue on your current medications as directed. Please refer to the Current Medication list given to you today.  *If you need a refill on your cardiac medications before your next appointment, please call your pharmacy*  Lab Work: BMET 1 WEEK PRIOR TO CT   Testing/Procedures: Non-Cardiac CT Angiography (CTA), is a special type of CT scan that uses a computer to produce multi-dimensional views of major blood vessels throughout the body. In CT angiography, a contrast material is injected through an IV to help visualize the blood vessels CHEST AND AORTA   Follow-Up: At [facility], you and your health needs are our priority.  As part of our continuing mission to provide you with exceptional heart care, we have created designated Provider Care Teams.  These Care Teams include your primary Cardiologist (physician) and Advanced Practice Providers (APPs -  Physician Assistants and Nurse Practitioners) who all work together to provide you with the care you need, when you need it.  We recommend signing up for the patient portal called MyChart.  Sign up information is provided on this After Visit Summary.  MyChart is used to connect with patients for Virtual Visits (Telemedicine).  Patients are able to view lab/test results, encounter notes, upcoming appointments, etc.  Non-urgent messages can be sent to your provider as well.   To learn more about what you can do with MyChart, go to [email or web address].    Your next appointment:   12 month(s)  The format for your next appointment:   In Person  Provider:   DR LONNI RECHE ORN, NP OR ROSALINE RAMAN NP

## 2023-11-18 ENCOUNTER — Other Ambulatory Visit: Payer: Self-pay | Admitting: Internal Medicine

## 2023-11-22 ENCOUNTER — Other Ambulatory Visit: Payer: Self-pay | Admitting: Internal Medicine

## 2023-11-22 DIAGNOSIS — I10 Essential (primary) hypertension: Secondary | ICD-10-CM | POA: Diagnosis not present

## 2023-11-22 DIAGNOSIS — Z01812 Encounter for preprocedural laboratory examination: Secondary | ICD-10-CM | POA: Diagnosis not present

## 2023-11-22 LAB — BASIC METABOLIC PANEL WITH GFR
BUN/Creatinine Ratio: 7 — ABNORMAL LOW (ref 10–24)
BUN: 8 mg/dL (ref 8–27)
CO2: 22 mmol/L (ref 20–29)
Calcium: 8.9 mg/dL (ref 8.6–10.2)
Chloride: 100 mmol/L (ref 96–106)
Creatinine, Ser: 1.07 mg/dL (ref 0.76–1.27)
Glucose: 121 mg/dL — ABNORMAL HIGH (ref 70–99)
Potassium: 4.2 mmol/L (ref 3.5–5.2)
Sodium: 138 mmol/L (ref 134–144)
eGFR: 74 mL/min/1.73 (ref >59)

## 2023-11-28 ENCOUNTER — Ambulatory Visit (HOSPITAL_BASED_OUTPATIENT_CLINIC_OR_DEPARTMENT_OTHER): Payer: Self-pay | Admitting: Cardiology

## 2023-11-29 ENCOUNTER — Ambulatory Visit (HOSPITAL_BASED_OUTPATIENT_CLINIC_OR_DEPARTMENT_OTHER)
Admission: RE | Admit: 2023-11-29 | Discharge: 2023-11-29 | Disposition: A | Discharge status: Home / Self Care | Source: Ambulatory Visit | Attending: Cardiology | Admitting: Cardiology

## 2023-11-29 DIAGNOSIS — I7121 Aneurysm of the ascending aorta, without rupture: Secondary | ICD-10-CM | POA: Diagnosis not present

## 2023-11-29 DIAGNOSIS — J432 Centrilobular emphysema: Secondary | ICD-10-CM | POA: Diagnosis not present

## 2023-11-29 DIAGNOSIS — I712 Thoracic aortic aneurysm, without rupture, unspecified: Secondary | ICD-10-CM | POA: Diagnosis not present

## 2023-11-29 DIAGNOSIS — I700 Atherosclerosis of aorta: Secondary | ICD-10-CM | POA: Diagnosis not present

## 2023-11-29 DIAGNOSIS — I2510 Atherosclerotic heart disease of native coronary artery without angina pectoris: Secondary | ICD-10-CM | POA: Diagnosis not present

## 2023-11-29 MED ORDER — IOHEXOL 350 MG/ML SOLN
100.0000 mL | Freq: Once | INTRAVENOUS | Status: AC | PRN
  Administered 2023-11-29: 100 mL via INTRAVENOUS

## 2023-12-15 DIAGNOSIS — M48062 Spinal stenosis, lumbar region with neurogenic claudication: Secondary | ICD-10-CM | POA: Diagnosis not present

## 2023-12-19 ENCOUNTER — Encounter: Payer: Self-pay | Admitting: Gastroenterology

## 2023-12-19 ENCOUNTER — Ambulatory Visit: Admitting: Gastroenterology

## 2023-12-19 VITALS — BP 120/60 | HR 69 | Temp 98.3°F | Resp 17 | Ht 73.0 in | Wt 240.0 lb

## 2023-12-19 DIAGNOSIS — K3189 Other diseases of stomach and duodenum: Secondary | ICD-10-CM | POA: Diagnosis not present

## 2023-12-19 DIAGNOSIS — K222 Esophageal obstruction: Secondary | ICD-10-CM

## 2023-12-19 DIAGNOSIS — K2100 Gastro-esophageal reflux disease with esophagitis, without bleeding: Secondary | ICD-10-CM

## 2023-12-19 DIAGNOSIS — D122 Benign neoplasm of ascending colon: Secondary | ICD-10-CM | POA: Diagnosis not present

## 2023-12-19 DIAGNOSIS — K648 Other hemorrhoids: Secondary | ICD-10-CM | POA: Diagnosis not present

## 2023-12-19 DIAGNOSIS — K5730 Diverticulosis of large intestine without perforation or abscess without bleeding: Secondary | ICD-10-CM | POA: Diagnosis not present

## 2023-12-19 DIAGNOSIS — K2970 Gastritis, unspecified, without bleeding: Secondary | ICD-10-CM | POA: Diagnosis not present

## 2023-12-19 DIAGNOSIS — D123 Benign neoplasm of transverse colon: Secondary | ICD-10-CM

## 2023-12-19 DIAGNOSIS — K644 Residual hemorrhoidal skin tags: Secondary | ICD-10-CM

## 2023-12-19 DIAGNOSIS — E785 Hyperlipidemia, unspecified: Secondary | ICD-10-CM | POA: Diagnosis not present

## 2023-12-19 DIAGNOSIS — R1310 Dysphagia, unspecified: Secondary | ICD-10-CM | POA: Diagnosis not present

## 2023-12-19 DIAGNOSIS — F32A Depression, unspecified: Secondary | ICD-10-CM | POA: Diagnosis not present

## 2023-12-19 DIAGNOSIS — Z1211 Encounter for screening for malignant neoplasm of colon: Secondary | ICD-10-CM | POA: Diagnosis not present

## 2023-12-19 DIAGNOSIS — I10 Essential (primary) hypertension: Secondary | ICD-10-CM | POA: Diagnosis not present

## 2023-12-19 DIAGNOSIS — F419 Anxiety disorder, unspecified: Secondary | ICD-10-CM | POA: Diagnosis not present

## 2023-12-19 DIAGNOSIS — Z860101 Personal history of adenomatous and serrated colon polyps: Secondary | ICD-10-CM

## 2023-12-19 MED ORDER — PANTOPRAZOLE SODIUM 40 MG PO TBEC: 40.0000 mg | DELAYED_RELEASE_TABLET | Freq: Two times a day (BID) | ORAL | 3 refills | Status: AC

## 2023-12-19 MED ORDER — SODIUM CHLORIDE 0.9 % IV SOLN: 500.0000 mL | Freq: Once | INTRAVENOUS | Status: DC

## 2023-12-19 NOTE — Op Note (Signed)
 [facility] Patient Name: Lee Owens Procedure Date: 12/19/2023 12:32 PM MRN: [id number] Endoscopist: Lee Owens , MD, [id number] Age: 73 Referring MD:  Date of Birth: 14-Sep-1950 Gender: Male Account #: [id number] Procedure:                Upper GI endoscopy Indications:              Dysphagia, Epigastric abdominal pain Medicines:                Monitored Anesthesia Care Procedure:                Pre-Anesthesia Assessment:                           - Prior to the procedure, a History and Physical                            was performed, and patient medications and                            allergies were reviewed. The patient's tolerance of                            previous anesthesia was also reviewed. The risks                            and benefits of the procedure and the sedation                            options and risks were discussed with the patient.                            All questions were answered, and informed consent                            was obtained. Prior Anticoagulants: The patient has                            taken no anticoagulant or antiplatelet agents. ASA                            Grade Assessment: III - A patient with severe                            systemic disease. After reviewing the risks and                            benefits, the patient was deemed in satisfactory                            condition to undergo the procedure.                           After obtaining informed consent, the endoscope was  passed under direct vision. Throughout the                            procedure, the patient's blood pressure, pulse, and                            oxygen saturations were monitored continuously. The                            Olympus Scope [id number] was introduced through the                            mouth, and advanced to the second part of duodenum.                            The  upper GI endoscopy was accomplished without                            difficulty. The patient tolerated the procedure                            well. Scope In: Scope Out: Findings:                 One benign-appearing, intrinsic mild stenosis was                            found 41 to 42 cm from the incisors. The stenosis                            was traversed. A TTS dilator was passed through the                            scope. Dilation with an 18-19-20 mm x 8 cm CRE                            balloon dilator was performed to 20 mm. The                            dilation site was examined following endoscope                            reinsertion and showed no change. Biopsies were                            taken with a cold forceps for histology.                           LA Grade C (one or more mucosal breaks continuous                            between tops of 2 or more mucosal folds, less than  75% circumference) esophagitis with no bleeding was                            found 40 to 42 cm from the incisors. Biopsies were                            taken with a cold forceps for histology.                           Patchy mild inflammation characterized by                            congestion (edema), erosions, erythema and                            friability was found in the entire examined                            stomach. Biopsies were taken with a cold forceps                            for Helicobacter pylori testing.                           The cardia and gastric fundus were normal on                            retroflexion.                           A small hiatal hernia was present.                           The examined duodenum was normal. Complications:            No immediate complications. Estimated Blood Loss:     Estimated blood loss was minimal. Impression:               - Benign-appearing esophageal stenosis. Dilated.                             Biopsied.                           - LA Grade C reflux esophagitis with no bleeding.                            Biopsied.                           - Gastritis. Biopsied.                           - Small hiatal hernia.                           - Normal examined duodenum. Recommendation:           -  Resume previous diet.                           - Continue present medications.                           - Await pathology results.                           - Follow an antireflux regimen.                           - Use Protonix  (pantoprazole ) 40 mg PO BID for 2                            months followed by 40mg  daily. Lee Brisby V. Alonah Lineback, MD 12/19/2023 2:38:59 PM This report has been signed electronically.

## 2023-12-19 NOTE — Patient Instructions (Signed)
Handouts given on polyps, hemorrhoids and diverticulosis.  YOU HAD AN ENDOSCOPIC PROCEDURE TODAY AT THE [facility]:   Refer to the procedure report that was given to you for any specific questions about what was found during the examination.  If the procedure report does not answer your questions, please call your gastroenterologist to clarify.  If you requested that your care partner not be given the details of your procedure findings, then the procedure report has been included in a sealed envelope for you to review at your convenience later.  YOU SHOULD EXPECT: Some feelings of bloating in the abdomen. Passage of more gas than usual.  Walking can help get rid of the air that was put into your GI tract during the procedure and reduce the bloating. If you had a lower endoscopy (such as a colonoscopy or flexible sigmoidoscopy) you may notice spotting of blood in your stool or on the toilet paper. If you underwent a bowel prep for your procedure, you may not have a normal bowel movement for a few days.  Please Note:  You might notice some irritation and congestion in your nose or some drainage.  This is from the oxygen used during your procedure.  There is no need for concern and it should clear up in a day or so.  SYMPTOMS TO REPORT IMMEDIATELY:  Following lower endoscopy (colonoscopy or flexible sigmoidoscopy):  Excessive amounts of blood in the stool  Significant tenderness or worsening of abdominal pains  Swelling of the abdomen that is new, acute  Fever of 100F or higher  Following upper endoscopy (EGD)  Vomiting of blood or coffee ground material  New chest pain or pain under the shoulder blades  Painful or persistently difficult swallowing  New shortness of breath  Fever of 100F or higher  Black, tarry-looking stools  For urgent or emergent issues, a gastroenterologist can be reached at any hour by calling [phone]. Do not use MyChart messaging for urgent  concerns.    DIET:  We do recommend a small meal at first, but then you may proceed to your regular diet.  Drink plenty of fluids but you should avoid alcoholic beverages for 24 hours.  ACTIVITY:  You should plan to take it easy for the rest of today and you should NOT DRIVE or use heavy machinery until tomorrow (because of the sedation medicines used during the test).    FOLLOW UP: Our staff will call the number listed on your records the next business day following your procedure.  We will call around 7:15- 8:00 am to check on you and address any questions or concerns that you may have regarding the information given to you following your procedure. If we do not reach you, we will leave a message.     If any biopsies were taken you will be contacted by phone or by letter within the next 1-3 weeks.  Please call us at [phone] if you have not heard about the biopsies in 3 weeks.    SIGNATURES/CONFIDENTIALITY: You and/or your care partner have signed paperwork which will be entered into your electronic medical record.  These signatures attest to the fact that that the information above on your After Visit Summary has been reviewed and is understood.  Full responsibility of the confidentiality of this discharge information lies with you and/or your care-partner.

## 2023-12-19 NOTE — Progress Notes (Signed)
 Called to room to assist during endoscopic procedure.  Patient ID and intended procedure confirmed with present staff. Received instructions for my participation in the procedure from the performing physician.

## 2023-12-19 NOTE — Progress Notes (Signed)
 Lee Owens   Primary Care Physician:  Garald Karlynn GAILS, MD   Reason for Procedure:  Dysphagia, epigastric pain, h/o colon polyps  Plan:    EGD and colonoscopy with possible interventions as needed     HPI: Lee Owens is a very pleasant 73 y.o. male here for EGD and colonoscopy for dysphagia, epigastric pain and h/o colon polyps.   The risks and benefits as well as alternatives of endoscopic procedure(s) have been discussed and reviewed. All questions answered. The patient agrees to proceed.    Past Medical History:  Diagnosis Date   Allergic rhinitis    Anxiety    takes Valium  bid   Arthritis    hips, knees, hands (07/07/2013)   Back pain    occasionally but reason unknown   BPH (benign prostatic hypertrophy)    takes Proscar  daily   Chronic fatigue    Complication of anesthesia    Per pt, Hard to sedate past last colonoscopy in 2008!   Depression    takes Cymbalta  daily   Diverticulosis    Dizziness    due to dehydration   Hemorrhoids    Hyperlipidemia    takes Lovastatin  daily   Hypertension    Insomnia    takes Restoril  nightly    Joint pain    knees/ankles/back pain   Joint swelling    Muscle pain    takes Baclofen  daily   Night muscle spasms    takes Valium  as needed   Pneumonia 1971   Right knee DJD    Urinary frequency    takes rapaflo daily   Vitamin B12 deficiency 2009   Vitamin D  deficiency disease     Past Surgical History:  Procedure Laterality Date   COLONOSCOPY     JOINT REPLACEMENT     both knees   KNEE ARTHROSCOPY Bilateral 2012   PROSTATE BIOPSY  01/2010   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 Authorizing Provider  allopurinol  (ZYLOPRIM ) 300 MG tablet TAKE 1 TABLET(300 MG) BY MOUTH DAILY 11/18/23   Plotnikov, Karlynn GAILS, MD  Cholecalciferol  (VITAMIN D -3) 5000 UNITS TABS Take 5,000 Units by mouth daily.    [provider]  diazepam  (VALIUM ) 10 MG tablet TAKE 1/2 TO 1 TABLET(5 TO 10 MG) BY MOUTH EVERY 8 HOURS AS NEEDED FOR ANXIETY OR MUSCLE SPASMS 10/02/23   Plotnikov, Aleksei V, MD  DULoxetine  (CYMBALTA ) 60 MG capsule TAKE 1 CAPSULE BY MOUTH TWICE DAILY 08/22/23   Plotnikov, Aleksei V, MD  fentaNYL  (DURAGESIC ) 25 MCG/HR Place 1 patch onto the skin every other day. 10/02/23   Plotnikov, Karlynn GAILS, MD  fentaNYL  (DURAGESIC ) 25 MCG/HR Place 1 patch onto the skin every other day. 10/02/23   Plotnikov, Karlynn GAILS, MD  fentaNYL  (DURAGESIC ) 25 MCG/HR Place 1 patch onto the skin every other day. 11/01/23   Plotnikov, Karlynn GAILS, MD  finasteride  (PROSCAR ) 5 MG tablet Take 5 mg by mouth daily.    [provider]  furosemide  (LASIX ) 40 MG tablet TAKE 1/2 TO 1 TABLET(20 TO 40  MG) BY MOUTH DAILY AS NEEDED 09/25/23   Plotnikov, Aleksei V, MD  gabapentin  (NEURONTIN ) 300 MG capsule TAKE 1 CAPSULE(300 MG) BY MOUTH THREE TIMES DAILY 11/26/23   Plotnikov, Aleksei V, MD  losartan  (COZAAR ) 25 MG tablet Take 12.5 mg by mouth at bedtime.    [provider]  lovastatin  (MEVACOR ) 40 MG tablet TAKE 1 TABLET(40 MG) BY MOUTH AT BEDTIME 11/14/23   Plotnikov, Aleksei V, MD  NARCAN  4 MG/0.1ML LIQD nasal spray kit Place 0.1 sprays (0.4 mg total) into the nose as needed. 10/02/23   Plotnikov, Aleksei V, MD  Oxycodone  HCl 10 MG TABS Take 1 tablet (10 mg total) by mouth every 6 (six) hours as needed. 10/02/23   Plotnikov, Aleksei V, MD  Oxycodone  HCl 10 MG TABS Take 1 tablet (10 mg total) by mouth every 6 (six) hours as needed. 10/02/23   Plotnikov, Aleksei V, MD  Oxycodone  HCl 10  MG TABS Take 1 tablet (10 mg total) by mouth every 6 (six) hours as needed. 10/02/23   Plotnikov, Karlynn GAILS, MD  tamsulosin  (FLOMAX ) 0.4 MG CAPS capsule Take 1 capsule by mouth daily. 10/29/15   [provider]  temazepam  (RESTORIL ) 30 MG capsule TAKE 1 TO 2 CAPSULES BY MOUTH AT BEDTIME AS NEEDED FOR SLEEP 03/27/23   Plotnikov, Aleksei V, MD  vitamin B-12 (CYANOCOBALAMIN ) 1000 MCG tablet Take 2,000 mcg by mouth daily.    [provider]    Current Outpatient Medications  Medication Sig Dispense Refill   allopurinol  (ZYLOPRIM ) 300 MG tablet TAKE 1 TABLET(300 MG) BY MOUTH DAILY 90 tablet 3   Cholecalciferol  (VITAMIN D -3) 5000 UNITS TABS Take 5,000 Units by mouth daily.     diazepam  (VALIUM ) 10 MG tablet TAKE 1/2 TO 1 TABLET(5 TO 10 MG) BY MOUTH EVERY 8 HOURS AS NEEDED FOR ANXIETY OR MUSCLE SPASMS 90 tablet 3   DULoxetine  (CYMBALTA ) 60 MG capsule TAKE 1 CAPSULE BY MOUTH TWICE DAILY 180 capsule 1   fentaNYL  (DURAGESIC ) 25 MCG/HR Place 1 patch onto the skin every other day. 15 patch 0   fentaNYL  (DURAGESIC ) 25 MCG/HR Place 1 patch onto the skin every other day. 15 patch 0   fentaNYL  (DURAGESIC ) 25 MCG/HR Place 1 patch onto the skin every other day. 15 patch 0   finasteride  (PROSCAR ) 5 MG tablet Take 5 mg by mouth daily.     furosemide  (LASIX ) 40 MG tablet TAKE 1/2 TO 1 TABLET(20 TO 40 MG) BY MOUTH DAILY AS NEEDED 30 tablet 5   gabapentin  (NEURONTIN ) 300 MG capsule TAKE 1 CAPSULE(300 MG) BY MOUTH THREE TIMES DAILY 270 capsule 1   losartan  (COZAAR ) 25 MG tablet Take 12.5 mg by mouth at bedtime.     lovastatin  (MEVACOR ) 40 MG tablet TAKE 1 TABLET(40 MG) BY MOUTH AT BEDTIME 90 tablet 3   NARCAN  4 MG/0.1ML LIQD nasal spray kit Place 0.1 sprays (0.4 mg total) into the nose as needed. 2 each 3   Oxycodone  HCl 10 MG TABS Take 1 tablet (10 mg total) by mouth every 6 (six) hours as needed. 120 tablet 0   Oxycodone  HCl 10 MG TABS Take 1 tablet (10 mg total) by mouth every 6 (six) hours as needed.  120 tablet 0   Oxycodone  HCl 10 MG TABS Take 1 tablet (10 mg total) by mouth every 6 (six) hours as needed. 120 tablet 0   tamsulosin  (FLOMAX ) 0.4 MG CAPS capsule Take 1 capsule by mouth daily.  2   temazepam  (RESTORIL ) 30  MG capsule TAKE 1 TO 2 CAPSULES BY MOUTH AT BEDTIME AS NEEDED FOR SLEEP 180 capsule 1   vitamin B-12 (CYANOCOBALAMIN ) 1000 MCG tablet Take 2,000 mcg by mouth daily.     No current facility-administered medications for this visit.    Allergies as of 12/19/2023 - Review Complete 12/19/2023  Allergen Reaction Noted   Alprazolam  Other (See Comments) 07/03/2023   Crestor  [rosuvastatin  calcium ] Other (See Comments) 09/26/2018   Semaglutide  Nausea And Vomiting 01/02/2023    Family History  Problem Relation Age of Onset   Liver cancer Mother    Heart failure Father    Kidney disease Father    Atrial fibrillation Father    Coronary artery disease Father    Dementia Sister    Hypertension Other    Coronary artery disease Other     Social History   Socioeconomic History   Marital status: Married    Spouse name: Mary   Number of children: Not on file   Years of education: Not on file   Highest education level: Some college, no degree  Occupational History   Occupation: Photographer: SELF-EMPLOYED   Occupation: RETIRED  Tobacco Use   Smoking status: Former    Current packs/day: 0.00    Average packs/day: 1 pack/day for 38.0 years (38.0 ttl pk-yrs)    Types: Cigarettes    Start date: 10/24/1967    Quit date: 10/23/2005    Years since quitting: 18.1   Smokeless tobacco: Never   Tobacco comments:    quit smoking 2008  Vaping Use   Vaping status: Never Used  Substance and Sexual Activity   Alcohol use: Yes    Alcohol/week: 14.0 standard drinks of alcohol    Types: 14 Cans of beer per week    Comment: 03/09/17 2 beers daily   Drug use: No    Comment: quit age 9   Sexual activity: Not Currently  Other Topics Concern   Not on file   Social History Narrative   Live with wife, Mary/2025   Education: some college   Retired   No children   Caffeine- 3 daily   Social Drivers of Corporate investment banker Strain: Low Risk  (08/28/2023)   Overall Financial Resource Strain (CARDIA)    Difficulty of Paying Living Expenses: Not hard at all  Food Insecurity: No Food Insecurity (08/28/2023)   Hunger Vital Sign    Worried About Running Out of Food in the Last Year: Never true    Ran Out of Food in the Last Year: Never true  Transportation Needs: No Transportation Needs (08/28/2023)   PRAPARE - Administrator, Civil Service (Medical): No    Lack of Transportation (Non-Medical): No  Owens Activity: Inactive (08/28/2023)   Exercise Vital Sign    Days of Exercise per Week: 0 days    Minutes of Exercise per Session: 0 min  Stress: No Stress Concern Present (08/28/2023)   [employer] of Occupational Health - Occupational Stress Questionnaire    Feeling of Stress : Not at all  Social Connections: Socially Isolated (08/28/2023)   Social Connection and Isolation Panel    Frequency of Communication with Friends and Family: Twice a week    Frequency of Social Gatherings with Friends and Family: Never    Attends Religious Services: Never    Database administrator or Organizations: No    Attends Banker Meetings: Never    Marital Status: Married  Intimate Partner Violence: Not At Risk (08/28/2023)   Humiliation, Afraid, Rape, and Kick questionnaire    Fear of Current or Ex-Partner: No    Emotionally Abused: No    Physically Abused: No    Sexually Abused: No    Review of Systems:  All other review of systems negative except as mentioned in the HPI.  Owens Exam: Vital signs in last 24 hours: BP (!) 147/94   Pulse 86   Temp 98.3 F (36.8 C) (Temporal)   Ht 6' 1 (1.854 m)   Wt 240 lb (108.9 kg)   SpO2 93%   BMI 31.66 kg/m  General:   Alert, NAD Lungs:  Clear .   Heart:  Regular rate and  rhythm Abdomen:  Soft, nontender and nondistended. Neuro/Psych:  Alert and cooperative. Normal mood and affect. A and O x 3  Reviewed labs, radiology imaging, old records and pertinent past GI work up  Patient is appropriate for planned procedure(s) and anesthesia in an ambulatory setting   K. Veena Lennette Fader , MD [phone]

## 2023-12-19 NOTE — Progress Notes (Signed)
 A/o x 3, VSS, good SR's, pleased with anesthesia, report to RN

## 2023-12-19 NOTE — Progress Notes (Signed)
 Pt's states no medical or surgical changes since previsit or office visit.

## 2023-12-19 NOTE — Op Note (Signed)
 [facility] Patient Name: Lee Owens Procedure Date: 12/19/2023 12:32 PM MRN: [id number] Endoscopist: Gustav ALONSO Mcgee , MD, [id number] Age: 73 Referring MD:  Date of Birth: [birth year] Gender: Male Account #: [id number] Procedure:                Colonoscopy Indications:              High risk colon cancer surveillance: Personal                            history of adenoma less than 10 mm in size Medicines:                Monitored Anesthesia Care Procedure:                Pre-Anesthesia Assessment:                           - Prior to the procedure, a History and Physical                            was performed, and patient medications and                            allergies were reviewed. The patient's tolerance of                            previous anesthesia was also reviewed. The risks                            and benefits of the procedure and the sedation                            options and risks were discussed with the patient.                            All questions were answered, and informed consent                            was obtained. Prior Anticoagulants: The patient has                            taken no anticoagulant or antiplatelet agents. ASA                            Grade Assessment: III - A patient with severe                            systemic disease. After reviewing the risks and                            benefits, the patient was deemed in satisfactory                            condition to undergo the procedure.  After obtaining informed consent, the colonoscope                            was passed under direct vision. Throughout the                            procedure, the patient's blood pressure, pulse, and                            oxygen saturations were monitored continuously. The                            Olympus Scope [id number] was introduced through the                            anus and  advanced to the the cecum, identified by                            appendiceal orifice and ileocecal valve. The                            colonoscopy was performed without difficulty. The                            patient tolerated the procedure well. The quality                            of the bowel preparation was good. The ileocecal                            valve, appendiceal orifice, and rectum were                            photographed. Scope In: 2:04:28 PM Scope Out: 2:22:25 PM Scope Withdrawal Time: 0 hours 14 minutes 59 seconds  Total Procedure Duration: 0 hours 17 minutes 57 seconds  Findings:                 The perianal and digital rectal examinations were                            normal.                           Eight sessile polyps were found in the transverse                            colon and ascending colon. The polyps were 5 to 12                            mm in size. These polyps were removed with a cold                            snare. Resection and retrieval were complete.  Scattered large-mouthed, medium-mouthed and                            small-mouthed diverticula were found in the sigmoid                            colon, descending colon, transverse colon and                            ascending colon.                           Non-bleeding external and internal hemorrhoids were                            found during retroflexion. The hemorrhoids were                            medium-sized. Complications:            No immediate complications. Estimated Blood Loss:     Estimated blood loss was minimal. Impression:               - Eight 5 to 12 mm polyps in the transverse colon                            and in the ascending colon, removed with a cold                            snare. Resected and retrieved.                           - Diverticulosis in the sigmoid colon, in the                            descending  colon, in the transverse colon and in                            the ascending colon.                           - Non-bleeding external and internal hemorrhoids. Recommendation:           - Resume previous diet.                           - Continue present medications.                           - Await pathology results.                           - Repeat colonoscopy in 3 years for surveillance. Mairim Bade V. Khalil Belote, MD 12/19/2023 2:34:43 PM This report has been signed electronically.

## 2023-12-20 ENCOUNTER — Telehealth: Payer: Self-pay | Admitting: *Deleted

## 2023-12-20 NOTE — Telephone Encounter (Signed)
  Follow up Call-     12/19/2023    1:07 PM  Call back number  Post procedure Call Back phone  # [phone]  Permission to leave phone message Yes     Patient questions:  Do you have a fever, pain , or abdominal swelling? No. Pain Score  0 *  Have you tolerated food without any problems? Yes.    Have you been able to return to your normal activities? Yes.    Do you have any questions about your discharge instructions: Diet   No. Medications  No. Follow up visit  No.  Do you have questions or concerns about your Care? No.  Actions: * If pain score is 4 or above: No action needed, pain <4.

## 2023-12-24 LAB — SURGICAL PATHOLOGY

## 2023-12-31 ENCOUNTER — Ambulatory Visit: Admitting: Internal Medicine

## 2023-12-31 ENCOUNTER — Encounter: Payer: Self-pay | Admitting: Internal Medicine

## 2023-12-31 VITALS — BP 136/100 | HR 82 | Temp 98.0°F | Ht 73.0 in | Wt 236.0 lb

## 2023-12-31 DIAGNOSIS — F5104 Psychophysiologic insomnia: Secondary | ICD-10-CM | POA: Diagnosis not present

## 2023-12-31 DIAGNOSIS — M5442 Lumbago with sciatica, left side: Secondary | ICD-10-CM | POA: Diagnosis not present

## 2023-12-31 DIAGNOSIS — G8929 Other chronic pain: Secondary | ICD-10-CM | POA: Diagnosis not present

## 2023-12-31 DIAGNOSIS — I10 Essential (primary) hypertension: Secondary | ICD-10-CM | POA: Diagnosis not present

## 2023-12-31 DIAGNOSIS — F419 Anxiety disorder, unspecified: Secondary | ICD-10-CM | POA: Diagnosis not present

## 2023-12-31 DIAGNOSIS — F39 Unspecified mood [affective] disorder: Secondary | ICD-10-CM

## 2023-12-31 DIAGNOSIS — M5441 Lumbago with sciatica, right side: Secondary | ICD-10-CM | POA: Diagnosis not present

## 2023-12-31 DIAGNOSIS — E119 Type 2 diabetes mellitus without complications: Secondary | ICD-10-CM | POA: Diagnosis not present

## 2023-12-31 MED ORDER — FENTANYL 25 MCG/HR TD PT72: 1.0000 | MEDICATED_PATCH | TRANSDERMAL | 0 refills | Status: DC

## 2023-12-31 MED ORDER — OXYCODONE HCL 10 MG PO TABS: 10.0000 mg | ORAL_TABLET | Freq: Four times a day (QID) | ORAL | 0 refills | Status: DC | PRN

## 2023-12-31 NOTE — Assessment & Plan Note (Signed)
Continue with B12 

## 2023-12-31 NOTE — Assessment & Plan Note (Addendum)
On NAS diet  

## 2023-12-31 NOTE — Patient Instructions (Signed)
 VEVOR Sauna Blanket, Portable Far Infrared Sauna for Home, [place] Sauna Bag w/Arm Holes & Carbon Fiber Heating, 1-6 Level Adjustable

## 2023-12-31 NOTE — Assessment & Plan Note (Addendum)
 Continue on: Oxy 10 mg qid prn Fentanyl  q 48 h Narcane prn  Potential benefits of a long term opioids use as well as potential risks (i.e. addiction risk, apnea etc) and complications (i.e. Somnolence, constipation and others) were explained to the patient and were aknowledged Seeing Dr Burnetta: surgical option was discussed w/Dr Burnetta Spinal inj is pending Get a sauna blanket

## 2023-12-31 NOTE — Assessment & Plan Note (Signed)
 Mild  Monitor Hgb A1c

## 2023-12-31 NOTE — Progress Notes (Signed)
 Subjective:  Patient ID: Lee Owens, male    DOB: 1950-05-19  Age: 73 y.o. MRN: [id number]  CC: Follow-up   HPI Lee Owens presents for chronic pain, stiffness, anxiety and depression  Outpatient Medications Prior to Visit  Medication Sig Dispense Refill   allopurinol  (ZYLOPRIM ) 300 MG tablet TAKE 1 TABLET(300 MG) BY MOUTH DAILY 90 tablet 3   Cholecalciferol  (VITAMIN D -3) 5000 UNITS TABS Take 5,000 Units by mouth daily.     diazepam  (VALIUM ) 10 MG tablet TAKE 1/2 TO 1 TABLET(5 TO 10 MG) BY MOUTH EVERY 8 HOURS AS NEEDED FOR ANXIETY OR MUSCLE SPASMS 90 tablet 3   DULoxetine  (CYMBALTA ) 60 MG capsule TAKE 1 CAPSULE BY MOUTH TWICE DAILY 180 capsule 1   finasteride  (PROSCAR ) 5 MG tablet Take 5 mg by mouth daily.     furosemide  (LASIX ) 40 MG tablet TAKE 1/2 TO 1 TABLET(20 TO 40 MG) BY MOUTH DAILY AS NEEDED 30 tablet 5   gabapentin  (NEURONTIN ) 300 MG capsule TAKE 1 CAPSULE(300 MG) BY MOUTH THREE TIMES DAILY 270 capsule 1   losartan  (COZAAR ) 25 MG tablet Take 12.5 mg by mouth at bedtime.     lovastatin  (MEVACOR ) 40 MG tablet TAKE 1 TABLET(40 MG) BY MOUTH AT BEDTIME 90 tablet 3   NARCAN  4 MG/0.1ML LIQD nasal spray kit Place 0.1 sprays (0.4 mg total) into the nose as needed. 2 each 3   pantoprazole  (PROTONIX ) 40 MG tablet Take 1 tablet (40 mg total) by mouth 2 (two) times daily. For 2 months and then start 40 mg daily 180 tablet 3   tamsulosin  (FLOMAX ) 0.4 MG CAPS capsule Take 1 capsule by mouth daily.  2   temazepam  (RESTORIL ) 30 MG capsule TAKE 1 TO 2 CAPSULES BY MOUTH AT BEDTIME AS NEEDED FOR SLEEP 180 capsule 1   vitamin B-12 (CYANOCOBALAMIN ) 1000 MCG tablet Take 2,000 mcg by mouth daily.     fentaNYL  (DURAGESIC ) 25 MCG/HR Place 1 patch onto the skin every other day. 15 patch 0   fentaNYL  (DURAGESIC ) 25 MCG/HR Place 1 patch onto the skin every other day. 15 patch 0   fentaNYL  (DURAGESIC ) 25 MCG/HR Place 1 patch onto the skin every other day. 15 patch 0   Oxycodone  HCl 10 MG TABS Take  1 tablet (10 mg total) by mouth every 6 (six) hours as needed. 120 tablet 0   Oxycodone  HCl 10 MG TABS Take 1 tablet (10 mg total) by mouth every 6 (six) hours as needed. 120 tablet 0   Oxycodone  HCl 10 MG TABS Take 1 tablet (10 mg total) by mouth every 6 (six) hours as needed. 120 tablet 0   No facility-administered medications prior to visit.    ROS: Review of Systems  Constitutional:  Positive for fatigue. Negative for appetite change and unexpected weight change.  HENT:  Negative for congestion, nosebleeds, sneezing, sore throat and trouble swallowing.   Eyes:  Negative for itching and visual disturbance.  Respiratory:  Negative for cough.   Cardiovascular:  Negative for chest pain, palpitations and leg swelling.  Gastrointestinal:  Negative for abdominal distention, blood in stool, diarrhea and nausea.  Genitourinary:  Negative for frequency and hematuria.  Musculoskeletal:  Positive for arthralgias, back pain, gait problem, neck pain and neck stiffness. Negative for joint swelling.  Skin:  Negative for rash.  Neurological:  Positive for weakness. Negative for dizziness, tremors and speech difficulty.  Psychiatric/Behavioral:  Positive for decreased concentration, dysphoric mood and sleep disturbance. Negative for agitation, behavioral  problems, confusion and suicidal ideas. The patient is nervous/anxious.     Objective:  BP (!) 136/100 (BP Location: Left Arm, Patient Position: Sitting, Cuff Size: Normal)   Pulse 82   Temp 98 F (36.7 C) (Oral)   Ht 6' 1 (1.854 m)   Wt 236 lb (107 kg)   SpO2 96%   BMI 31.14 kg/m   BP Readings from Last 3 Encounters:  12/31/23 (!) 136/100  12/19/23 120/60  11/15/23 95/66    Wt Readings from Last 3 Encounters:  12/31/23 236 lb (107 kg)  12/19/23 240 lb (108.9 kg)  11/15/23 241 lb (109.3 kg)    Physical Exam Constitutional:      General: He is not in acute distress.    Appearance: He is well-developed. He is obese.     Comments:  NAD  Eyes:     Conjunctiva/sclera: Conjunctivae normal.     Pupils: Pupils are equal, round, and reactive to light.  Neck:     Thyroid : No thyromegaly.     Vascular: No JVD.  Cardiovascular:     Rate and Rhythm: Normal rate and regular rhythm.     Heart sounds: Normal heart sounds. No murmur heard.    No friction rub. No gallop.  Pulmonary:     Effort: Pulmonary effort is normal. No respiratory distress.     Breath sounds: Normal breath sounds. No wheezing or rales.  Chest:     Chest wall: No tenderness.  Abdominal:     General: Bowel sounds are normal. There is no distension.     Palpations: Abdomen is soft. There is no mass.     Tenderness: There is no abdominal tenderness. There is no guarding or rebound.  Musculoskeletal:        General: Tenderness present. Normal range of motion.     Cervical back: Normal range of motion.     Right lower leg: No edema.     Left lower leg: No edema.  Lymphadenopathy:     Cervical: No cervical adenopathy.  Skin:    General: Skin is warm and dry.     Findings: No rash.  Neurological:     Mental Status: He is alert and oriented to person, place, and time.     Cranial Nerves: No cranial nerve deficit.     Motor: Weakness present. No abnormal muscle tone.     Coordination: Coordination abnormal.     Gait: Gait abnormal.     Deep Tendon Reflexes: Reflexes are normal and symmetric.  Psychiatric:        Behavior: Behavior normal.        Thought Content: Thought content normal.        Judgment: Judgment normal.     Lab Results  Component Value Date   WBC 6.1 10/02/2023   HGB 14.9 10/02/2023   HCT 43.3 10/02/2023   PLT 201.0 10/02/2023   GLUCOSE 121 (H) 11/22/2023   CHOL 152 03/29/2023   TRIG 104.0 03/29/2023   HDL 49.30 03/29/2023   LDLCALC 82 03/29/2023   ALT 23 10/02/2023   AST 18 10/02/2023   NA 138 11/22/2023   K 4.2 11/22/2023   CL 100 11/22/2023   CREATININE 1.07 11/22/2023   BUN 8 11/22/2023   CO2 22 11/22/2023   TSH  1.06 10/02/2023   PSA 0.76 06/29/2016   INR 0.96 06/30/2013   HGBA1C 6.2 03/29/2023    CT ANGIO CHEST AORTA W/CM & OR WO/CM Result Date: 12/03/2023 CLINICAL DATA:  Thoracic aortic aneurysm. EXAM: CT ANGIOGRAPHY CHEST WITH CONTRAST TECHNIQUE: Multidetector CT imaging of the chest was performed using the standard protocol during bolus administration of intravenous contrast. Multiplanar CT image reconstructions and MIPs were obtained to evaluate the vascular anatomy. RADIATION DOSE REDUCTION: This exam was performed according to the departmental dose-optimization program which includes automated exposure control, adjustment of the mA and/or kV according to patient size and/or use of iterative reconstruction technique. CONTRAST:  OMNIPAQUE  IOHEXOL  350 MG/ML SOLN COMPARISON:  11/09/2022. FINDINGS: Cardiovascular: Atherosclerotic calcification of the aorta and coronary arteries. Ascending aorta measures 4.4 cm (7/92), stable. Heart is at the upper limits of normal in size to mildly enlarged. No pericardial effusion. Mediastinum/Nodes: No pathologically enlarged mediastinal, hilar or axillary lymph nodes. Esophagus is grossly unremarkable. Lungs/Pleura: Minimal dependent atelectasis in the lung bases. Lungs are otherwise clear. Mild centrilobular emphysema. No pleural fluid. Airway is unremarkable. Upper Abdomen: Visualized portions of the liver, gallbladder, adrenal glands, left kidney, spleen, pancreas, stomach and bowel are grossly unremarkable. No upper abdominal adenopathy. Musculoskeletal: Degenerative changes in the spine. Review of the MIP images confirms the above findings. IMPRESSION: 1. 4.4 cm ascending aortic aneurysm, stable. Recommend annual imaging followup by CTA or MRA. This recommendation follows 2010 ACCF/AHA/AATS/ACR/ASA/SCA/SCAI/SIR/STS/SVM Guidelines for the Diagnosis and Management of Patients with Thoracic Aortic Disease. Circulation. 2010; 121: Z733-z630. Aortic aneurysm NOS  (ICD10-I71.9). 2. Aortic atherosclerosis (ICD10-I70.0). Coronary artery calcification. 3.  Emphysema (ICD10-J43.9). Electronically Signed   By: Newell Eke M.D.   On: 12/03/2023 14:48    Assessment & Plan:   Problem List Items Addressed This Visit     Anxiety   On Diazepam  prn  Potential benefits of a long term benzodiazepines  use as well as potential risks  and complications were explained to the patient and were aknowledged.      Diabetes mellitus, type 2 (HCC)   Mild  Monitor Hgb A1c      Hypertension   On NAS diet      Insomnia   Chronic  On Temazepam   Potential benefits of a long term benzodiazepines  use as well as potential risks  and complications were explained to the patient and were aknowledged.       Low back pain - Primary   Continue on: Oxy 10 mg qid prn Fentanyl  q 48 h Narcane prn  Potential benefits of a long term opioids use as well as potential risks (i.e. addiction risk, apnea etc) and complications (i.e. Somnolence, constipation and others) were explained to the patient and were aknowledged Seeing Dr Burnetta: surgical option was discussed w/Dr Burnetta Spinal inj is pending Get a sauna blanket      Relevant Medications   fentaNYL  (DURAGESIC ) 25 MCG/HR   fentaNYL  (DURAGESIC ) 25 MCG/HR   fentaNYL  (DURAGESIC ) 25 MCG/HR   Oxycodone  HCl 10 MG TABS   Oxycodone  HCl 10 MG TABS   Oxycodone  HCl 10 MG TABS   Mood disorder   Depression, situational On Cymbalta          Meds ordered this encounter  Medications   fentaNYL  (DURAGESIC ) 25 MCG/HR    Sig: Place 1 patch onto the skin every other day.    Dispense:  15 patch    Refill:  0    Please fill on or after 01/02/24. Code: M54.50   fentaNYL  (DURAGESIC ) 25 MCG/HR    Sig: Place 1 patch onto the skin every other day.    Dispense:  15 patch    Refill:  0  Please fill on or after 02/01/24. Code: M54.50   fentaNYL  (DURAGESIC ) 25 MCG/HR    Sig: Place 1 patch onto the skin every other day.     Dispense:  15 patch    Refill:  0    Please fill on or after 03/02/24. Code: M54.50   Oxycodone  HCl 10 MG TABS    Sig: Take 1 tablet (10 mg total) by mouth every 6 (six) hours as needed.    Dispense:  120 tablet    Refill:  0    Please fill on or after 01/02/24. Code: M54.50   Oxycodone  HCl 10 MG TABS    Sig: Take 1 tablet (10 mg total) by mouth every 6 (six) hours as needed.    Dispense:  120 tablet    Refill:  0    Please fill on or after 02/01/24. Code: M54.50   Oxycodone  HCl 10 MG TABS    Sig: Take 1 tablet (10 mg total) by mouth every 6 (six) hours as needed.    Dispense:  120 tablet    Refill:  0    Please fill on or after 03/02/24. Code: M54.50      Follow-up: Return in about 3 months (around 04/01/2024) for a follow-up visit.  Marolyn Noel, MD

## 2023-12-31 NOTE — Assessment & Plan Note (Signed)
On Diazepam prn  Potential benefits of a long term benzodiazepines  use as well as potential risks  and complications were explained to the patient and were aknowledged.  

## 2023-12-31 NOTE — Assessment & Plan Note (Signed)
 Depression, situational On Cymbalta 

## 2023-12-31 NOTE — Assessment & Plan Note (Signed)
Chronic On Temazepam  Potential benefits of a long term benzodiazepines  use as well as potential risks  and complications were explained to the patient and were aknowledged.

## 2024-01-02 DIAGNOSIS — Z23 Encounter for immunization: Secondary | ICD-10-CM | POA: Diagnosis not present

## 2024-01-04 ENCOUNTER — Ambulatory Visit: Payer: Self-pay | Admitting: Gastroenterology

## 2024-01-15 DIAGNOSIS — M545 Low back pain, unspecified: Secondary | ICD-10-CM | POA: Diagnosis not present

## 2024-01-28 DIAGNOSIS — M545 Low back pain, unspecified: Secondary | ICD-10-CM | POA: Diagnosis not present

## 2024-01-28 DIAGNOSIS — R269 Unspecified abnormalities of gait and mobility: Secondary | ICD-10-CM | POA: Diagnosis not present

## 2024-02-06 DIAGNOSIS — M545 Low back pain, unspecified: Secondary | ICD-10-CM | POA: Diagnosis not present

## 2024-02-06 DIAGNOSIS — R269 Unspecified abnormalities of gait and mobility: Secondary | ICD-10-CM | POA: Diagnosis not present

## 2024-02-15 ENCOUNTER — Other Ambulatory Visit: Payer: Self-pay | Admitting: Internal Medicine

## 2024-02-19 ENCOUNTER — Other Ambulatory Visit: Payer: Self-pay | Admitting: Internal Medicine

## 2024-02-19 DIAGNOSIS — R269 Unspecified abnormalities of gait and mobility: Secondary | ICD-10-CM | POA: Diagnosis not present

## 2024-02-19 DIAGNOSIS — M545 Low back pain, unspecified: Secondary | ICD-10-CM | POA: Diagnosis not present

## 2024-02-25 DIAGNOSIS — M545 Low back pain, unspecified: Secondary | ICD-10-CM | POA: Diagnosis not present

## 2024-02-25 DIAGNOSIS — R269 Unspecified abnormalities of gait and mobility: Secondary | ICD-10-CM | POA: Diagnosis not present

## 2024-03-10 ENCOUNTER — Other Ambulatory Visit: Payer: Self-pay | Admitting: Internal Medicine

## 2024-03-11 DIAGNOSIS — R269 Unspecified abnormalities of gait and mobility: Secondary | ICD-10-CM | POA: Diagnosis not present

## 2024-03-11 DIAGNOSIS — M545 Low back pain, unspecified: Secondary | ICD-10-CM | POA: Diagnosis not present

## 2024-03-14 ENCOUNTER — Other Ambulatory Visit: Payer: Self-pay | Admitting: Internal Medicine

## 2024-03-24 ENCOUNTER — Ambulatory Visit: Admitting: Internal Medicine

## 2024-03-24 ENCOUNTER — Encounter: Payer: Self-pay | Admitting: Internal Medicine

## 2024-03-24 VITALS — BP 134/80 | HR 58 | Temp 98.5°F | Ht 73.0 in | Wt 240.0 lb

## 2024-03-24 DIAGNOSIS — F419 Anxiety disorder, unspecified: Secondary | ICD-10-CM | POA: Diagnosis not present

## 2024-03-24 DIAGNOSIS — M5442 Lumbago with sciatica, left side: Secondary | ICD-10-CM | POA: Diagnosis not present

## 2024-03-24 DIAGNOSIS — G8929 Other chronic pain: Secondary | ICD-10-CM

## 2024-03-24 DIAGNOSIS — E559 Vitamin D deficiency, unspecified: Secondary | ICD-10-CM | POA: Diagnosis not present

## 2024-03-24 DIAGNOSIS — M5441 Lumbago with sciatica, right side: Secondary | ICD-10-CM | POA: Diagnosis not present

## 2024-03-24 DIAGNOSIS — E785 Hyperlipidemia, unspecified: Secondary | ICD-10-CM | POA: Diagnosis not present

## 2024-03-24 DIAGNOSIS — E119 Type 2 diabetes mellitus without complications: Secondary | ICD-10-CM

## 2024-03-24 DIAGNOSIS — E538 Deficiency of other specified B group vitamins: Secondary | ICD-10-CM

## 2024-03-24 DIAGNOSIS — I10 Essential (primary) hypertension: Secondary | ICD-10-CM

## 2024-03-24 MED ORDER — FENTANYL 25 MCG/HR TD PT72: 1.0000 | MEDICATED_PATCH | TRANSDERMAL | 0 refills | Status: AC

## 2024-03-24 MED ORDER — OXYCODONE HCL 10 MG PO TABS: 10.0000 mg | ORAL_TABLET | Freq: Four times a day (QID) | ORAL | 0 refills | Status: AC | PRN

## 2024-03-24 MED ORDER — METHOCARBAMOL 750 MG PO TABS: 750.0000 mg | ORAL_TABLET | Freq: Three times a day (TID) | ORAL | 3 refills | Status: AC | PRN

## 2024-03-24 NOTE — Assessment & Plan Note (Signed)
 Mild  Monitor Hgb A1c

## 2024-03-24 NOTE — Assessment & Plan Note (Signed)
On Diazepam prn  Potential benefits of a long term benzodiazepines  use as well as potential risks  and complications were explained to the patient and were aknowledged.  

## 2024-03-24 NOTE — Assessment & Plan Note (Addendum)
 Continue on: Oxy 10 mg qid prn Fentanyl  q 48 h Narcane prn  Potential benefits of a long term opioids use as well as potential risks (i.e. addiction risk, apnea etc) and complications (i.e. Somnolence, constipation and others) were explained to the patient and were aknowledged Seeing Dr Burnetta: surgical option was discussed w/Dr Burnetta Spinal inj is pending 02/2024 Using a sauna blanket Will try Robaxin  Refused NSAIDs

## 2024-03-24 NOTE — Assessment & Plan Note (Signed)
Lovastatin, aspirin CoQ10  

## 2024-03-24 NOTE — Assessment & Plan Note (Signed)
On B12/B complex 

## 2024-03-24 NOTE — Assessment & Plan Note (Signed)
On NAS diet  

## 2024-03-24 NOTE — Assessment & Plan Note (Signed)
 Refractory - weekly Vit D 50000 iu

## 2024-03-24 NOTE — Progress Notes (Signed)
 "  Subjective:  Patient ID: Lee Owens, male    DOB: 1951-01-01  Age: 73 y.o. MRN: [id number]  CC: Medical Management of Chronic Issues   HPI YUNUS STOKLOSA presents for chronic pain, depression, spasms PT w/[facility]   Outpatient Medications Prior to Visit  Medication Sig Dispense Refill   allopurinol  (ZYLOPRIM ) 300 MG tablet TAKE 1 TABLET(300 MG) BY MOUTH DAILY 90 tablet 3   Cholecalciferol  (VITAMIN D -3) 5000 UNITS TABS Take 5,000 Units by mouth daily.     diazepam  (VALIUM ) 10 MG tablet TAKE 1/2 TO 1 TABLET(5 TO 10 MG) BY MOUTH EVERY 8 HOURS AS NEEDED FOR ANXIETY OR MUSCLE SPASMS 90 tablet 0   DULoxetine  (CYMBALTA ) 60 MG capsule TAKE 1 CAPSULE BY MOUTH TWICE DAILY 180 capsule 1   finasteride  (PROSCAR ) 5 MG tablet Take 5 mg by mouth daily.     furosemide  (LASIX ) 40 MG tablet TAKE 1/2 TO 1 TABLET(20 TO 40 MG) BY MOUTH DAILY AS NEEDED 30 tablet 5   gabapentin  (NEURONTIN ) 300 MG capsule TAKE 1 CAPSULE(300 MG) BY MOUTH THREE TIMES DAILY 270 capsule 1   losartan  (COZAAR ) 25 MG tablet Take 12.5 mg by mouth at bedtime.     lovastatin  (MEVACOR ) 40 MG tablet TAKE 1 TABLET(40 MG) BY MOUTH AT BEDTIME 90 tablet 3   NARCAN  4 MG/0.1ML LIQD nasal spray kit Place 0.1 sprays (0.4 mg total) into the nose as needed. 2 each 3   pantoprazole  (PROTONIX ) 40 MG tablet Take 1 tablet (40 mg total) by mouth 2 (two) times daily. For 2 months and then start 40 mg daily 180 tablet 3   tamsulosin  (FLOMAX ) 0.4 MG CAPS capsule Take 1 capsule by mouth daily.  2   temazepam  (RESTORIL ) 30 MG capsule TAKE 1 TO 2 CAPSULES BY MOUTH AT BEDTIME AS NEEDED FOR SLEEP 180 capsule 1   vitamin B-12 (CYANOCOBALAMIN ) 1000 MCG tablet Take 2,000 mcg by mouth daily.     fentaNYL  (DURAGESIC ) 25 MCG/HR Place 1 patch onto the skin every other day. 15 patch 0   fentaNYL  (DURAGESIC ) 25 MCG/HR Place 1 patch onto the skin every other day. 15 patch 0   fentaNYL  (DURAGESIC ) 25 MCG/HR Place 1 patch onto the skin every other day. 15 patch 0    Oxycodone  HCl 10 MG TABS Take 1 tablet (10 mg total) by mouth every 6 (six) hours as needed. 120 tablet 0   Oxycodone  HCl 10 MG TABS Take 1 tablet (10 mg total) by mouth every 6 (six) hours as needed. 120 tablet 0   Oxycodone  HCl 10 MG TABS Take 1 tablet (10 mg total) by mouth every 6 (six) hours as needed. 120 tablet 0   No facility-administered medications prior to visit.    ROS: Review of Systems  Constitutional:  Positive for fatigue. Negative for appetite change and unexpected weight change.  HENT:  Negative for congestion, nosebleeds, sneezing, sore throat and trouble swallowing.   Eyes:  Negative for itching and visual disturbance.  Respiratory:  Negative for cough.   Cardiovascular:  Negative for chest pain, palpitations and leg swelling.  Gastrointestinal:  Negative for abdominal distention, blood in stool, diarrhea and nausea.  Genitourinary:  Negative for frequency and hematuria.  Musculoskeletal:  Positive for arthralgias, back pain and gait problem. Negative for joint swelling and neck pain.  Skin:  Negative for rash.  Neurological:  Negative for dizziness, tremors, speech difficulty and weakness.  Psychiatric/Behavioral:  Positive for decreased concentration. Negative for agitation, dysphoric mood, sleep  disturbance and suicidal ideas. The patient is nervous/anxious.     Objective:  BP 134/80 (BP Location: Left Arm, Patient Position: Sitting, Cuff Size: Large)   Pulse (!) 58   Temp 98.5 F (36.9 C) (Oral)   Ht 6' 1 (1.854 m)   Wt 240 lb (108.9 kg)   SpO2 93%   BMI 31.66 kg/m   BP Readings from Last 3 Encounters:  03/24/24 134/80  12/31/23 (!) 136/100  12/19/23 120/60    Wt Readings from Last 3 Encounters:  03/24/24 240 lb (108.9 kg)  12/31/23 236 lb (107 kg)  12/19/23 240 lb (108.9 kg)    Physical Exam Constitutional:      General: He is not in acute distress.    Appearance: He is well-developed.     Comments: NAD  Eyes:     Conjunctiva/sclera:  Conjunctivae normal.     Pupils: Pupils are equal, round, and reactive to light.  Neck:     Thyroid : No thyromegaly.     Vascular: No JVD.  Cardiovascular:     Rate and Rhythm: Normal rate and regular rhythm.     Heart sounds: Normal heart sounds. No murmur heard.    No friction rub. No gallop.  Pulmonary:     Effort: Pulmonary effort is normal. No respiratory distress.     Breath sounds: Normal breath sounds. No wheezing or rales.  Chest:     Chest wall: No tenderness.  Abdominal:     General: Bowel sounds are normal. There is no distension.     Palpations: Abdomen is soft. There is no mass.     Tenderness: There is no abdominal tenderness. There is no guarding or rebound.  Musculoskeletal:        General: Tenderness present. Normal range of motion.     Cervical back: Normal range of motion.     Right lower leg: No edema.     Left lower leg: No edema.  Lymphadenopathy:     Cervical: No cervical adenopathy.  Skin:    General: Skin is warm and dry.     Findings: No rash.  Neurological:     Mental Status: He is alert and oriented to person, place, and time.     Cranial Nerves: No cranial nerve deficit.     Motor: No abnormal muscle tone.     Coordination: Coordination normal.     Gait: Gait normal.     Deep Tendon Reflexes: Reflexes are normal and symmetric.  Psychiatric:        Behavior: Behavior normal.        Thought Content: Thought content normal.        Judgment: Judgment normal.     Lab Results  Component Value Date   WBC 6.1 10/02/2023   HGB 14.9 10/02/2023   HCT 43.3 10/02/2023   PLT 201.0 10/02/2023   GLUCOSE 121 (H) 11/22/2023   CHOL 152 03/29/2023   TRIG 104.0 03/29/2023   HDL 49.30 03/29/2023   LDLCALC 82 03/29/2023   ALT 23 10/02/2023   AST 18 10/02/2023   NA 138 11/22/2023   K 4.2 11/22/2023   CL 100 11/22/2023   CREATININE 1.07 11/22/2023   BUN 8 11/22/2023   CO2 22 11/22/2023   TSH 1.06 10/02/2023   PSA 0.76 06/29/2016   INR 0.96  06/30/2013   HGBA1C 6.2 03/29/2023    CT ANGIO CHEST AORTA W/CM & OR WO/CM Result Date: 12/03/2023 CLINICAL DATA:  Thoracic aortic aneurysm. EXAM: CT ANGIOGRAPHY  CHEST WITH CONTRAST TECHNIQUE: Multidetector CT imaging of the chest was performed using the standard protocol during bolus administration of intravenous contrast. Multiplanar CT image reconstructions and MIPs were obtained to evaluate the vascular anatomy. RADIATION DOSE REDUCTION: This exam was performed according to the departmental dose-optimization program which includes automated exposure control, adjustment of the mA and/or kV according to patient size and/or use of iterative reconstruction technique. CONTRAST:  OMNIPAQUE  IOHEXOL  350 MG/ML SOLN COMPARISON:  11/09/2022. FINDINGS: Cardiovascular: Atherosclerotic calcification of the aorta and coronary arteries. Ascending aorta measures 4.4 cm (7/92), stable. Heart is at the upper limits of normal in size to mildly enlarged. No pericardial effusion. Mediastinum/Nodes: No pathologically enlarged mediastinal, hilar or axillary lymph nodes. Esophagus is grossly unremarkable. Lungs/Pleura: Minimal dependent atelectasis in the lung bases. Lungs are otherwise clear. Mild centrilobular emphysema. No pleural fluid. Airway is unremarkable. Upper Abdomen: Visualized portions of the liver, gallbladder, adrenal glands, left kidney, spleen, pancreas, stomach and bowel are grossly unremarkable. No upper abdominal adenopathy. Musculoskeletal: Degenerative changes in the spine. Review of the MIP images confirms the above findings. IMPRESSION: 1. 4.4 cm ascending aortic aneurysm, stable. Recommend annual imaging followup by CTA or MRA. This recommendation follows 2010 ACCF/AHA/AATS/ACR/ASA/SCA/SCAI/SIR/STS/SVM Guidelines for the Diagnosis and Management of Patients with Thoracic Aortic Disease. Circulation. 2010; 121: Z733-z630. Aortic aneurysm NOS (ICD10-I71.9). 2. Aortic atherosclerosis (ICD10-I70.0).  Coronary artery calcification. 3.  Emphysema (ICD10-J43.9). Electronically Signed   By: Newell Eke M.D.   On: 12/03/2023 14:48    Assessment & Plan:   Problem List Items Addressed This Visit     Anxiety   On Diazepam  prn  Potential benefits of a long term benzodiazepines  use as well as potential risks  and complications were explained to the patient and were aknowledged.      Diabetes mellitus, type 2 (HCC)   Mild  Monitor Hgb A1c      Dyslipidemia    Lovastatin , aspirin  CoQ10      Hypertension   On NAS diet      Low back pain - Primary   Continue on: Oxy 10 mg qid prn Fentanyl  q 48 h Narcane prn  Potential benefits of a long term opioids use as well as potential risks (i.e. addiction risk, apnea etc) and complications (i.e. Somnolence, constipation and others) were explained to the patient and were aknowledged Seeing Dr Burnetta: surgical option was discussed w/Dr Burnetta Spinal inj is pending 02/2024 Using a sauna blanket Will try Robaxin  Refused NSAIDs      Relevant Medications   fentaNYL  (DURAGESIC ) 25 MCG/HR   fentaNYL  (DURAGESIC ) 25 MCG/HR   fentaNYL  (DURAGESIC ) 25 MCG/HR   Oxycodone  HCl 10 MG TABS   Oxycodone  HCl 10 MG TABS   Oxycodone  HCl 10 MG TABS   methocarbamol  (ROBAXIN ) 750 MG tablet   Vitamin B12 deficiency   On B12/B complex      Vitamin D  deficiency   Refractory - weekly Vit D 50000 iu         Meds ordered this encounter  Medications   fentaNYL  (DURAGESIC ) 25 MCG/HR    Sig: Place 1 patch onto the skin every other day.    Dispense:  15 patch    Refill:  0    Please fill on or after 04/01/24. Code: M54.50   fentaNYL  (DURAGESIC ) 25 MCG/HR    Sig: Place 1 patch onto the skin every other day.    Dispense:  15 patch    Refill:  0    Please fill  on or after 05/01/24. Code: M54.50   fentaNYL  (DURAGESIC ) 25 MCG/HR    Sig: Place 1 patch onto the skin every other day.    Dispense:  15 patch    Refill:  0    Please fill on or after 05/31/24.  Code: M54.50   Oxycodone  HCl 10 MG TABS    Sig: Take 1 tablet (10 mg total) by mouth every 6 (six) hours as needed.    Dispense:  120 tablet    Refill:  0    Please fill on or after 04/01/24. Code: M54.50   Oxycodone  HCl 10 MG TABS    Sig: Take 1 tablet (10 mg total) by mouth every 6 (six) hours as needed.    Dispense:  120 tablet    Refill:  0    Please fill on or after 05/01/24. Code: M54.50   Oxycodone  HCl 10 MG TABS    Sig: Take 1 tablet (10 mg total) by mouth every 6 (six) hours as needed.    Dispense:  120 tablet    Refill:  0    Please fill on or after 05/31/24. Code: M54.50   methocarbamol  (ROBAXIN ) 750 MG tablet    Sig: Take 1 tablet (750 mg total) by mouth every 8 (eight) hours as needed for muscle spasms.    Dispense:  90 tablet    Refill:  3      Follow-up: Return in about 3 months (around 06/22/2024) for a follow-up visit.  Marolyn Noel, MD "

## 2024-04-07 ENCOUNTER — Ambulatory Visit: Admitting: Internal Medicine

## 2024-04-09 ENCOUNTER — Other Ambulatory Visit: Payer: Self-pay | Admitting: Internal Medicine

## 2024-04-14 ENCOUNTER — Telehealth: Payer: Self-pay

## 2024-04-14 NOTE — Telephone Encounter (Signed)
 It was renewed on 04/13/2024 Thanks

## 2024-04-14 NOTE — Telephone Encounter (Signed)
 Copied from CRM [id number]. Topic: Clinical - Prescription Issue >> Apr 14, 2024  8:07 AM Rea BROCKS wrote: Reason for CRM: Called the pharmacy to get diazapam refilled. Did not realize last month that Padonda Webb filled it, instead of Dr. Garald. When the pharmacy notified that notifed Padonda Webb and not Dr. Garald. Pharmacy is awaiting approval and patient is not without his diazapam. Patient has been without his medication and they need Dr. Plotnikovs approval.   [phone] (M) Patients wife   [id number] [place], [place] - [address] AT [facility] OF [facility] [address] & [facility] [address] [place] [address] Phone: [phone] Fax: [phone] Hours: Not open 24 hours

## 2024-04-29 ENCOUNTER — Other Ambulatory Visit: Payer: Self-pay

## 2024-04-29 ENCOUNTER — Ambulatory Visit: Payer: Self-pay

## 2024-04-29 MED ORDER — LOSARTAN POTASSIUM 25 MG PO TABS: 12.5000 mg | ORAL_TABLET | Freq: Every day | ORAL | 2 refills | Status: AC

## 2024-04-30 ENCOUNTER — Ambulatory Visit: Admitting: Internal Medicine

## 2024-04-30 ENCOUNTER — Encounter: Payer: Self-pay | Admitting: Internal Medicine

## 2024-04-30 VITALS — BP 148/88 | HR 74 | Ht 73.0 in

## 2024-04-30 DIAGNOSIS — N320 Bladder-neck obstruction: Secondary | ICD-10-CM

## 2024-04-30 DIAGNOSIS — I10 Essential (primary) hypertension: Secondary | ICD-10-CM

## 2024-04-30 DIAGNOSIS — F39 Unspecified mood [affective] disorder: Secondary | ICD-10-CM

## 2024-04-30 DIAGNOSIS — E559 Vitamin D deficiency, unspecified: Secondary | ICD-10-CM

## 2024-04-30 DIAGNOSIS — E291 Testicular hypofunction: Secondary | ICD-10-CM

## 2024-04-30 DIAGNOSIS — E785 Hyperlipidemia, unspecified: Secondary | ICD-10-CM

## 2024-04-30 DIAGNOSIS — R739 Hyperglycemia, unspecified: Secondary | ICD-10-CM

## 2024-04-30 DIAGNOSIS — G8929 Other chronic pain: Secondary | ICD-10-CM

## 2024-04-30 DIAGNOSIS — E538 Deficiency of other specified B group vitamins: Secondary | ICD-10-CM

## 2024-04-30 DIAGNOSIS — E669 Obesity, unspecified: Secondary | ICD-10-CM

## 2024-04-30 LAB — CBC WITH DIFFERENTIAL/PLATELET
Basophils Absolute: 0.1 10*3/uL (ref 0.0–0.1)
Basophils Relative: 1.2 % (ref 0.0–3.0)
Eosinophils Absolute: 0.7 10*3/uL (ref 0.0–0.7)
Eosinophils Relative: 10.3 % — ABNORMAL HIGH (ref 0.0–5.0)
HCT: 41.8 % (ref 39.0–52.0)
Hemoglobin: 14.4 g/dL (ref 13.0–17.0)
Lymphocytes Relative: 19.9 % (ref 12.0–46.0)
Lymphs Abs: 1.3 10*3/uL (ref 0.7–4.0)
MCHC: 34.5 g/dL (ref 30.0–36.0)
MCV: 91.7 fl (ref 78.0–100.0)
Monocytes Absolute: 0.5 10*3/uL (ref 0.1–1.0)
Monocytes Relative: 8.4 % (ref 3.0–12.0)
Neutro Abs: 3.9 10*3/uL (ref 1.4–7.7)
Neutrophils Relative %: 60.2 % (ref 43.0–77.0)
Platelets: 253 10*3/uL (ref 150.0–400.0)
RBC: 4.56 Mil/uL (ref 4.22–5.81)
RDW: 14.1 % (ref 11.5–15.5)
WBC: 6.5 10*3/uL (ref 4.0–10.5)

## 2024-04-30 LAB — URINALYSIS
Bilirubin Urine: NEGATIVE
Hgb urine dipstick: NEGATIVE
Ketones, ur: NEGATIVE
Leukocytes,Ua: NEGATIVE
Nitrite: NEGATIVE
Specific Gravity, Urine: 1.01 (ref 1.000–1.030)
Total Protein, Urine: NEGATIVE
Urine Glucose: NEGATIVE
Urobilinogen, UA: 0.2 (ref 0.0–1.0)
pH: 6 (ref 5.0–8.0)

## 2024-04-30 LAB — TSH: TSH: 1.75 u[IU]/mL (ref 0.35–5.50)

## 2024-04-30 LAB — COMPREHENSIVE METABOLIC PANEL WITH GFR
ALT: 17 U/L (ref 3–53)
AST: 13 U/L (ref 5–37)
Albumin: 3.9 g/dL (ref 3.5–5.2)
Alkaline Phosphatase: 57 U/L (ref 39–117)
BUN: 9 mg/dL (ref 6–23)
CO2: 31 meq/L (ref 19–32)
Calcium: 9.1 mg/dL (ref 8.4–10.5)
Chloride: 104 meq/L (ref 96–112)
Creatinine, Ser: 0.95 mg/dL (ref 0.40–1.50)
GFR: 79.65 mL/min
Glucose, Bld: 120 mg/dL — ABNORMAL HIGH (ref 70–99)
Potassium: 4.7 meq/L (ref 3.5–5.1)
Sodium: 143 meq/L (ref 135–145)
Total Bilirubin: 0.5 mg/dL (ref 0.2–1.2)
Total Protein: 6 g/dL (ref 6.0–8.3)

## 2024-04-30 LAB — PSA: PSA: 0.5 ng/mL (ref 0.10–4.00)

## 2024-04-30 LAB — LIPID PANEL
Cholesterol: 126 mg/dL (ref 28–200)
HDL: 51.8 mg/dL
LDL Cholesterol: 60 mg/dL (ref 10–99)
NonHDL: 74.4
Total CHOL/HDL Ratio: 2
Triglycerides: 71 mg/dL (ref 10.0–149.0)
VLDL: 14.2 mg/dL (ref 0.0–40.0)

## 2024-04-30 LAB — HEMOGLOBIN A1C: Hgb A1c MFr Bld: 6 % (ref 4.6–6.5)

## 2024-04-30 NOTE — Assessment & Plan Note (Signed)
 Wt Readings from Last 3 Encounters:  03/24/24 240 lb (108.9 kg)  12/31/23 236 lb (107 kg)  12/19/23 240 lb (108.9 kg)

## 2024-04-30 NOTE — Assessment & Plan Note (Signed)
 Refractory - weekly Vit D 50000 iu

## 2024-04-30 NOTE — Assessment & Plan Note (Signed)
 Continue on: Oxy 10 mg qid prn Fentanyl  q 48 h Narcane prn  Potential benefits of a long term opioids use as well as potential risks (i.e. addiction risk, apnea etc) and complications (i.e. Somnolence, constipation and others) were explained to the patient and were aknowledged Seeing Dr Burnetta: surgical option was discussed w/Dr Burnetta Spinal inj is pending 02/2024 Using a sauna blanket Will try Robaxin  Refused NSAIDs

## 2024-04-30 NOTE — Assessment & Plan Note (Signed)
 Depression, situational On Cymbalta 

## 2024-04-30 NOTE — Progress Notes (Signed)
 "  Subjective:  Patient ID: Lee Owens, male    DOB: [birth year]  Age: 74 y.o. MRN: [id number]  CC: Shortness of Breath (Sinus pain, dyspnea)   HPI TAKARI LUNDAHL presents for URI sx's F/u on chronic pain, depression   Outpatient Medications Prior to Visit  Medication Sig Dispense Refill   allopurinol  (ZYLOPRIM ) 300 MG tablet TAKE 1 TABLET(300 MG) BY MOUTH DAILY 90 tablet 3   Cholecalciferol  (VITAMIN D -3) 5000 UNITS TABS Take 5,000 Units by mouth daily.     diazepam  (VALIUM ) 10 MG tablet TAKE 1/2 TO 1 TABLET(5 TO 10 MG) BY MOUTH EVERY 8 HOURS AS NEEDED FOR ANXIETY OR MUSCLE SPASMS 90 tablet 3   DULoxetine  (CYMBALTA ) 60 MG capsule TAKE 1 CAPSULE BY MOUTH TWICE DAILY 180 capsule 1   fentaNYL  (DURAGESIC ) 25 MCG/HR Place 1 patch onto the skin every other day. 15 patch 0   fentaNYL  (DURAGESIC ) 25 MCG/HR Place 1 patch onto the skin every other day. 15 patch 0   fentaNYL  (DURAGESIC ) 25 MCG/HR Place 1 patch onto the skin every other day. 15 patch 0   finasteride  (PROSCAR ) 5 MG tablet Take 5 mg by mouth daily.     gabapentin  (NEURONTIN ) 300 MG capsule TAKE 1 CAPSULE(300 MG) BY MOUTH THREE TIMES DAILY 270 capsule 1   losartan  (COZAAR ) 25 MG tablet Take 0.5 tablets (12.5 mg total) by mouth at bedtime. 45 tablet 2   lovastatin  (MEVACOR ) 40 MG tablet TAKE 1 TABLET(40 MG) BY MOUTH AT BEDTIME 90 tablet 3   methocarbamol  (ROBAXIN ) 750 MG tablet Take 1 tablet (750 mg total) by mouth every 8 (eight) hours as needed for muscle spasms. 90 tablet 3   NARCAN  4 MG/0.1ML LIQD nasal spray kit Place 0.1 sprays (0.4 mg total) into the nose as needed. 2 each 3   Oxycodone  HCl 10 MG TABS Take 1 tablet (10 mg total) by mouth every 6 (six) hours as needed. 120 tablet 0   Oxycodone  HCl 10 MG TABS Take 1 tablet (10 mg total) by mouth every 6 (six) hours as needed. 120 tablet 0   Oxycodone  HCl 10 MG TABS Take 1 tablet (10 mg total) by mouth every 6 (six) hours as needed. 120 tablet 0   pantoprazole  (PROTONIX ) 40 MG  tablet Take 1 tablet (40 mg total) by mouth 2 (two) times daily. For 2 months and then start 40 mg daily 180 tablet 3   tamsulosin  (FLOMAX ) 0.4 MG CAPS capsule Take 1 capsule by mouth daily.  2   temazepam  (RESTORIL ) 30 MG capsule TAKE 1 TO 2 CAPSULES BY MOUTH AT BEDTIME AS NEEDED FOR SLEEP 180 capsule 1   vitamin B-12 (CYANOCOBALAMIN ) 1000 MCG tablet Take 2,000 mcg by mouth daily.     furosemide  (LASIX ) 40 MG tablet TAKE 1/2 TO 1 TABLET(20 TO 40 MG) BY MOUTH DAILY AS NEEDED 30 tablet 5   No facility-administered medications prior to visit.    ROS: Review of Systems  Constitutional:  Positive for fatigue. Negative for appetite change and unexpected weight change.  HENT:  Negative for congestion, nosebleeds, sneezing, sore throat and trouble swallowing.   Eyes:  Negative for itching and visual disturbance.  Respiratory:  Negative for cough.   Cardiovascular:  Negative for chest pain, palpitations and leg swelling.  Gastrointestinal:  Negative for abdominal distention, blood in stool, diarrhea and nausea.  Genitourinary:  Negative for frequency and hematuria.  Musculoskeletal:  Positive for arthralgias, back pain, gait problem, myalgias, neck pain and neck  stiffness. Negative for joint swelling.  Skin:  Negative for rash.  Neurological:  Positive for weakness. Negative for dizziness, tremors and speech difficulty.  Psychiatric/Behavioral:  Negative for agitation, confusion, dysphoric mood, sleep disturbance and suicidal ideas. The patient is nervous/anxious.     Objective:  BP (!) 148/88   Pulse 74   Ht 6' 1 (1.854 m)   SpO2 97%   BMI 31.66 kg/m   BP Readings from Last 3 Encounters:  04/30/24 (!) 148/88  03/24/24 134/80  12/31/23 (!) 136/100    Wt Readings from Last 3 Encounters:  03/24/24 240 lb (108.9 kg)  12/31/23 236 lb (107 kg)  12/19/23 240 lb (108.9 kg)    Physical Exam Constitutional:      General: He is not in acute distress.    Appearance: He is well-developed.  He is obese.     Comments: NAD  Eyes:     Conjunctiva/sclera: Conjunctivae normal.     Pupils: Pupils are equal, round, and reactive to light.  Neck:     Thyroid : No thyromegaly.     Vascular: No JVD.  Cardiovascular:     Rate and Rhythm: Normal rate and regular rhythm.     Heart sounds: Normal heart sounds. No murmur heard.    No friction rub. No gallop.  Pulmonary:     Effort: Pulmonary effort is normal. No respiratory distress.     Breath sounds: Normal breath sounds. No wheezing or rales.  Chest:     Chest wall: No tenderness.  Abdominal:     General: Bowel sounds are normal. There is no distension.     Palpations: Abdomen is soft. There is no mass.     Tenderness: There is no abdominal tenderness. There is no guarding or rebound.  Musculoskeletal:        General: Normal range of motion.     Cervical back: Normal range of motion.     Right lower leg: Tenderness present. No edema.     Left lower leg: Tenderness present. No edema.  Lymphadenopathy:     Cervical: No cervical adenopathy.  Skin:    General: Skin is warm and dry.     Findings: No rash.  Neurological:     Mental Status: He is alert and oriented to person, place, and time.     Cranial Nerves: No cranial nerve deficit.     Motor: No abnormal muscle tone.     Coordination: Coordination normal.     Gait: Gait normal.     Deep Tendon Reflexes: Reflexes are normal and symmetric.  Psychiatric:        Behavior: Behavior normal. Behavior is not agitated.        Thought Content: Thought content normal.        Judgment: Judgment normal.   Using a cane LS, neck w/pain Tender muscles  Lab Results  Component Value Date   WBC 6.1 10/02/2023   HGB 14.9 10/02/2023   HCT 43.3 10/02/2023   PLT 201.0 10/02/2023   GLUCOSE 121 (H) 11/22/2023   CHOL 152 03/29/2023   TRIG 104.0 03/29/2023   HDL 49.30 03/29/2023   LDLCALC 82 03/29/2023   ALT 23 10/02/2023   AST 18 10/02/2023   NA 138 11/22/2023   K 4.2 11/22/2023    CL 100 11/22/2023   CREATININE 1.07 11/22/2023   BUN 8 11/22/2023   CO2 22 11/22/2023   TSH 1.06 10/02/2023   PSA 0.76 06/29/2016   INR 0.96 06/30/2013  HGBA1C 6.2 03/29/2023    CT ANGIO CHEST AORTA W/CM & OR WO/CM Result Date: 12/03/2023 CLINICAL DATA:  Thoracic aortic aneurysm. EXAM: CT ANGIOGRAPHY CHEST WITH CONTRAST TECHNIQUE: Multidetector CT imaging of the chest was performed using the standard protocol during bolus administration of intravenous contrast. Multiplanar CT image reconstructions and MIPs were obtained to evaluate the vascular anatomy. RADIATION DOSE REDUCTION: This exam was performed according to the departmental dose-optimization program which includes automated exposure control, adjustment of the mA and/or kV according to patient size and/or use of iterative reconstruction technique. CONTRAST:  OMNIPAQUE  IOHEXOL  350 MG/ML SOLN COMPARISON:  11/09/2022. FINDINGS: Cardiovascular: Atherosclerotic calcification of the aorta and coronary arteries. Ascending aorta measures 4.4 cm (7/92), stable. Heart is at the upper limits of normal in size to mildly enlarged. No pericardial effusion. Mediastinum/Nodes: No pathologically enlarged mediastinal, hilar or axillary lymph nodes. Esophagus is grossly unremarkable. Lungs/Pleura: Minimal dependent atelectasis in the lung bases. Lungs are otherwise clear. Mild centrilobular emphysema. No pleural fluid. Airway is unremarkable. Upper Abdomen: Visualized portions of the liver, gallbladder, adrenal glands, left kidney, spleen, pancreas, stomach and bowel are grossly unremarkable. No upper abdominal adenopathy. Musculoskeletal: Degenerative changes in the spine. Review of the MIP images confirms the above findings. IMPRESSION: 1. 4.4 cm ascending aortic aneurysm, stable. Recommend annual imaging followup by CTA or MRA. This recommendation follows 2010 ACCF/AHA/AATS/ACR/ASA/SCA/SCAI/SIR/STS/SVM Guidelines for the Diagnosis and Management of Patients  with Thoracic Aortic Disease. Circulation. 2010; 121: [id number]. Aortic aneurysm NOS (ICD10-I71.9). 2. Aortic atherosclerosis (ICD10-I70.0). Coronary artery calcification. 3.  Emphysema (ICD10-J43.9). Electronically Signed   By: Newell Eke M.D.   On: 12/03/2023 14:48    Assessment & Plan:   Problem List Items Addressed This Visit     Vitamin D  deficiency - Primary   Refractory - weekly Vit D 50000 iu      Hypogonadism, male   Testosterone  IM vs Androgel  discussed      Hypertension   On NAS diet      Vitamin B12 deficiency   On B12/B complex      Dyslipidemia    Lovastatin , aspirin  CoQ10      Low back pain   Continue on: Oxy 10 mg qid prn Fentanyl  q 48 h Narcane prn  Potential benefits of a long term opioids use as well as potential risks (i.e. addiction risk, apnea etc) and complications (i.e. Somnolence, constipation and others) were explained to the patient and were aknowledged Seeing Dr Burnetta: surgical option was discussed w/Dr Burnetta Spinal inj is pending 02/2024 Using a sauna blanket Will try Robaxin  Refused NSAIDs      Mood disorder   Depression, situational On Cymbalta       Obesity (BMI 30-39.9)   Wt Readings from Last 3 Encounters:  03/24/24 240 lb (108.9 kg)  12/31/23 236 lb (107 kg)  12/19/23 240 lb (108.9 kg)            No orders of the defined types were placed in this encounter.     Follow-up: No follow-ups on file.  Marolyn Noel, MD "

## 2024-04-30 NOTE — Assessment & Plan Note (Signed)
Testosterone IM vs Androgel discussed

## 2024-04-30 NOTE — Assessment & Plan Note (Signed)
Lovastatin, aspirin CoQ10  

## 2024-04-30 NOTE — Assessment & Plan Note (Signed)
On B12/B complex 

## 2024-04-30 NOTE — Assessment & Plan Note (Signed)
On NAS diet  

## 2024-06-23 ENCOUNTER — Ambulatory Visit: Admitting: Internal Medicine

## 2024-08-28 ENCOUNTER — Ambulatory Visit
